# Patient Record
Sex: Female | Born: 1975 | Race: Black or African American | Hispanic: No | Smoking: Current every day smoker
Health system: Southern US, Community
[De-identification: ages and names within clinical notes are randomized; demographics above are authoritative.]

## PROBLEM LIST (undated history)

## (undated) DIAGNOSIS — Z170 Estrogen receptor positive status [ER+]: Principal | ICD-10-CM

## (undated) DIAGNOSIS — J449 Chronic obstructive pulmonary disease, unspecified: Secondary | ICD-10-CM

## (undated) DIAGNOSIS — F319 Bipolar disorder, unspecified: Secondary | ICD-10-CM

## (undated) DIAGNOSIS — F32A Depression, unspecified: Secondary | ICD-10-CM

## (undated) DIAGNOSIS — F419 Anxiety disorder, unspecified: Secondary | ICD-10-CM

## (undated) DIAGNOSIS — F329 Major depressive disorder, single episode, unspecified: Secondary | ICD-10-CM

## (undated) DIAGNOSIS — F4310 Post-traumatic stress disorder, unspecified: Secondary | ICD-10-CM

## (undated) DIAGNOSIS — C50412 Malignant neoplasm of upper-outer quadrant of left female breast: Principal | ICD-10-CM

## (undated) HISTORY — DX: Malignant neoplasm of upper-outer quadrant of left female breast: C50.412

## (undated) HISTORY — DX: Anxiety disorder, unspecified: F41.9

## (undated) HISTORY — PX: EYE SURGERY: SHX253

## (undated) HISTORY — DX: Estrogen receptor positive status (ER+): Z17.0

## (undated) NOTE — *Deleted (*Deleted)
Patient Care Team: Department, [facility] as PCP - General Claud Kelp, MD as Consulting Physician (General Surgery) Serena Croissant, MD as Consulting Physician (Hematology and Oncology) Dorothy Puffer, MD as Consulting Physician (Radiation Oncology) Axel Filler Larna Daughters, NP as Nurse Practitioner (Hematology and Oncology)  DIAGNOSIS: No diagnosis found.  SUMMARY OF ONCOLOGIC HISTORY: Oncology History  Malignant neoplasm of upper-outer quadrant of left breast in female, estrogen receptor positive (HCC)  11/01/2016 Initial Diagnosis   Palpable left breast mass with 4 enl axillary lymph nodes 4.8 x 0.8 x 3.1 cm; left axillary mass 1.1 x 0.5 cm, lymph node 1.4 x 0.8 cm biopsy grade 2-3 IDC ER 30% PR 0% HER-2 positive ratio 7.53, Ki-67 20%, T2 N2 MX stage IIIa clinical stage   12/02/2016 Genetic Testing   Negative genetic testing on the STAT panel.  The STAT Breast cancer panel offered by [facility] includes sequencing and rearrangement analysis for the following 9 genes:  ATM, BRCA1, BRCA2, CDH1, CHEK2, PALB2, PTEN, STK11 and TP53.   The report date is December 02, 2016.   12/14/2016 Surgery   Left modified radical mastectomy: IDC grade 3, spans 5 cm and a 2.2 cm with extensive DCIS grade 3, lymphovascular invasion present, margins negative, 17/17 lymph nodes positive, ER 30%, PR 0%, HER-2 positive, Ki-67 20%, T2 N3a M0 stage IIIB AJCC 8   01/10/2017 - 03/27/2018 Chemotherapy   Adjuvant chemotherapy with TCH Perjeta 6 cycles followed by Herceptin Perjeta maintenance (multiple delays and missed doses led to final discontinuation)   06/07/2017 - 07/25/2017 Radiation Therapy   Adjuvant radiation therapy   08/2017 -  Anti-estrogen oral therapy   Tamoxifen daily   09/02/2019 Progression   She was admitted to [facility] from 09/02/19-09/07/19 after presenting to the ED for headaches and unsteady gait. 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 Pathology showed metastatic adenocarcinoma, consistent with breast origin, HER-2 positive (3+), ER/PR negative, Ki67 75%.      CHIEF COMPLIANT: Follow-up of metastatic breast cancer.   INTERVAL HISTORY: Diane Rosario is a 79 y.o. with above-mentioned history of metastatic breast cancer s/p resection of a cerebellar mass.She presents to the clinic todayfor follow-up.   ALLERGIES:  is allergic to asa [aspirin].  MEDICATIONS:  Current Outpatient Medications  Medication Sig Dispense Refill  . ALPRAZolam (XANAX) 0.5 MG tablet Take 0.5 mg by mouth 2 (two) times daily as needed for anxiety.     Marland Kitchen dexamethasone (DECADRON) 1 MG tablet Take 1 tablet (1 mg total) by mouth 2 (two) times daily with a meal. Take 2 tablets (2 mg total) twice a day x1 day, then decrease to 1 mg twice a day x2 weeks. 18 tablet 0  . ENSURE (ENSURE) Take 237 mLs by mouth daily.    Marland Kitchen HYDROcodone-acetaminophen (NORCO) 5-325 MG tablet Take 1 tablet by mouth every 6 (six) hours as needed for moderate pain. 60 tablet 0  . risperiDONE microspheres (RISPERDAL CONSTA) 50 MG injection Inject 50 mg into the muscle every 14 (fourteen) days.    . tamoxifen (NOLVADEX) 20 MG tablet Take 1 tablet (20 mg total) by mouth 2 (two) times daily. (Patient not taking: Reported on 09/23/2019) 90 tablet 3   No current facility-administered medications for this visit.   Facility-Administered Medications Ordered in Other Visits  Medication Dose Route Frequency Provider Last Rate Last Admin  . sodium chloride flush (NS) 0.9 % injection 10 mL  10 mL Intracatheter PRN Gudena,  Mikey [employer], MD        PHYSICAL EXAMINATION: ECOG PERFORMANCE STATUS: {CHL ONC ECOG BJ:[id number]}  There were no vitals filed for this visit. There were no vitals filed for this visit.  LABORATORY DATA:  I have reviewed the data as listed CMP Latest Ref Rng & Units 09/05/2019 09/03/2019 09/02/2019  Glucose 70 - 99 mg/dL 086(V) 784(O) 962(X)  BUN 6 - 20  mg/dL 12 52(W) 41(L)  Creatinine 0.44 - 1.00 mg/dL 2.44 0.10 2.72  Sodium 135 - 145 mmol/L 134(L) 141 139  Potassium 3.5 - 5.1 mmol/L 4.2 4.0 4.2  Chloride 98 - 111 mmol/L 97(L) 101 101  CO2 22 - 32 mmol/L 25 31 26   Calcium 8.9 - 10.3 mg/dL 9.0 9.5 9.4  Total Protein 6.5 - 8.1 g/dL - - 7.8  Total Bilirubin 0.3 - 1.2 mg/dL - - 0.4  Alkaline Phos 38 - 126 U/L - - 56  AST 15 - 41 U/L - - 34  ALT 0 - 44 U/L - - 18    Lab Results  Component Value Date   WBC 9.6 09/05/2019   HGB 10.4 (L) 09/05/2019   HCT 31.9 (L) 09/05/2019   MCV 99.7 09/05/2019   PLT 199 09/05/2019   NEUTROABS 4.7 09/02/2019    ASSESSMENT & PLAN:  No problem-specific Assessment & Plan notes found for this encounter.    No orders of the defined types were placed in this encounter.  The patient has a good understanding of the overall plan. she agrees with it. she will call with any problems that may develop before the next visit here.  Total time spent: *** mins including face to face time and time spent for planning, charting and coordination of care  Serena Croissant, MD 01/19/2020  I, Kirt Boys Dorshimer, am acting as scribe for Dr. Serena Croissant.  {insert scribe attestation}

---

## 1997-08-20 ENCOUNTER — Ambulatory Visit (HOSPITAL_COMMUNITY): Admission: AD | Admit: 1997-08-20 | Discharge: 1997-08-21 | Payer: Self-pay | Admitting: Obstetrics

## 1997-08-21 ENCOUNTER — Emergency Department (HOSPITAL_COMMUNITY): Admission: EM | Admit: 1997-08-21 | Discharge: 1997-08-21 | Payer: Self-pay | Admitting: Emergency Medicine

## 1997-09-19 ENCOUNTER — Inpatient Hospital Stay (HOSPITAL_COMMUNITY): Admission: AD | Admit: 1997-09-19 | Discharge: 1997-09-19 | Payer: Self-pay | Admitting: *Deleted

## 1998-04-19 ENCOUNTER — Inpatient Hospital Stay (HOSPITAL_COMMUNITY): Admission: AD | Admit: 1998-04-19 | Discharge: 1998-04-19 | Payer: Self-pay | Admitting: Obstetrics & Gynecology

## 1999-10-12 ENCOUNTER — Inpatient Hospital Stay (HOSPITAL_COMMUNITY): Admission: AD | Admit: 1999-10-12 | Discharge: 1999-10-12 | Payer: Self-pay | Admitting: *Deleted

## 2001-01-15 ENCOUNTER — Inpatient Hospital Stay (HOSPITAL_COMMUNITY): Admission: AD | Admit: 2001-01-15 | Discharge: 2001-01-15 | Payer: Self-pay | Admitting: Obstetrics

## 2001-11-04 ENCOUNTER — Inpatient Hospital Stay (HOSPITAL_COMMUNITY): Admission: AD | Admit: 2001-11-04 | Discharge: 2001-11-04 | Payer: Self-pay | Admitting: *Deleted

## 2004-11-25 ENCOUNTER — Emergency Department (HOSPITAL_COMMUNITY): Admission: EM | Admit: 2004-11-25 | Discharge: 2004-11-25 | Payer: Self-pay | Admitting: Emergency Medicine

## 2008-03-08 ENCOUNTER — Emergency Department (HOSPITAL_COMMUNITY): Admission: EM | Admit: 2008-03-08 | Discharge: 2008-03-08 | Payer: Self-pay | Admitting: Emergency Medicine

## 2008-03-08 IMAGING — CR DG KNEE COMPLETE 4+V*R*
4 series · 4 of 4 positions shown · non-contrast
Comparison: None available.

CLINICAL DATA: Trauma.  Knee pain.

RIGHT KNEE - COMPLETE 4+ VIEW

[t knee ap right]
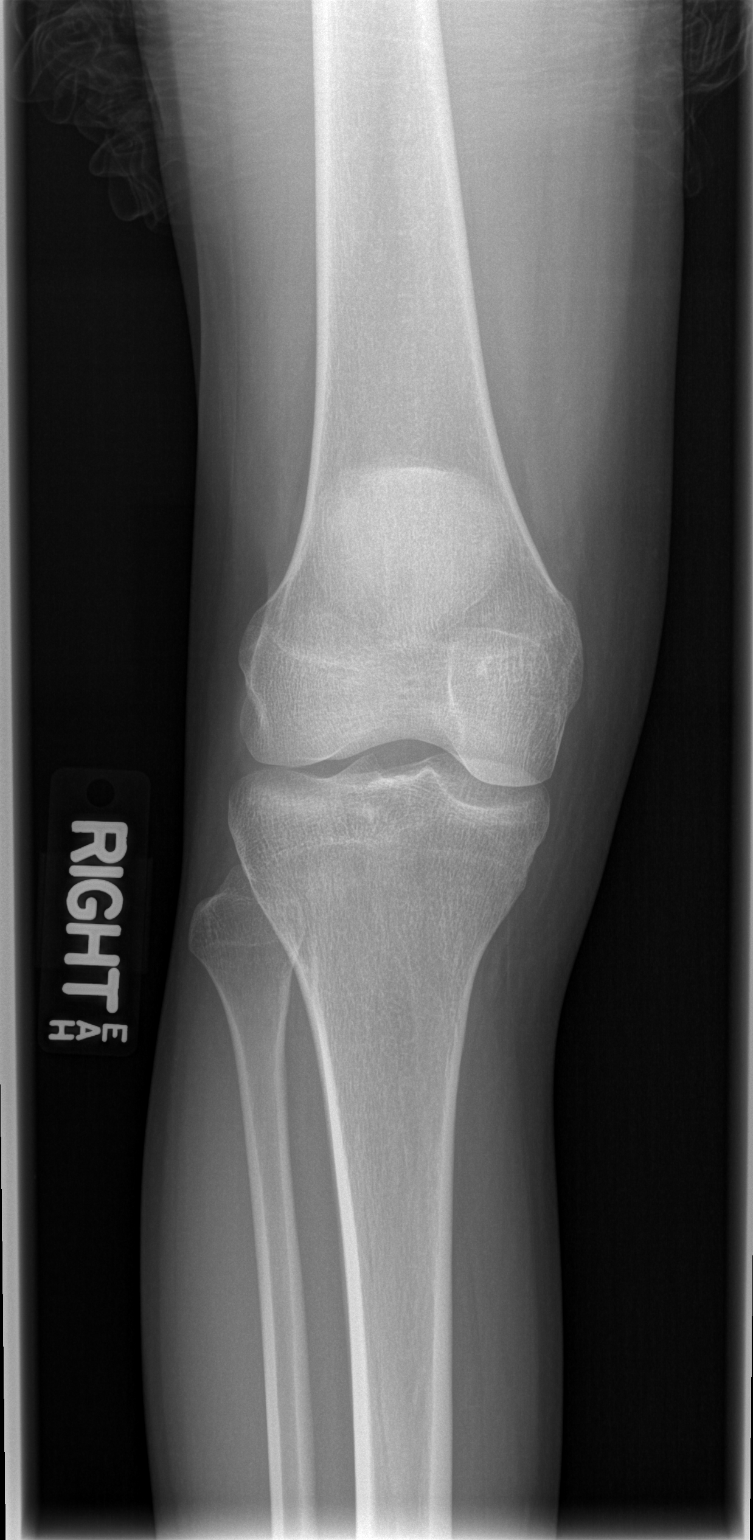

[t knee oblique right (1 of 2)]
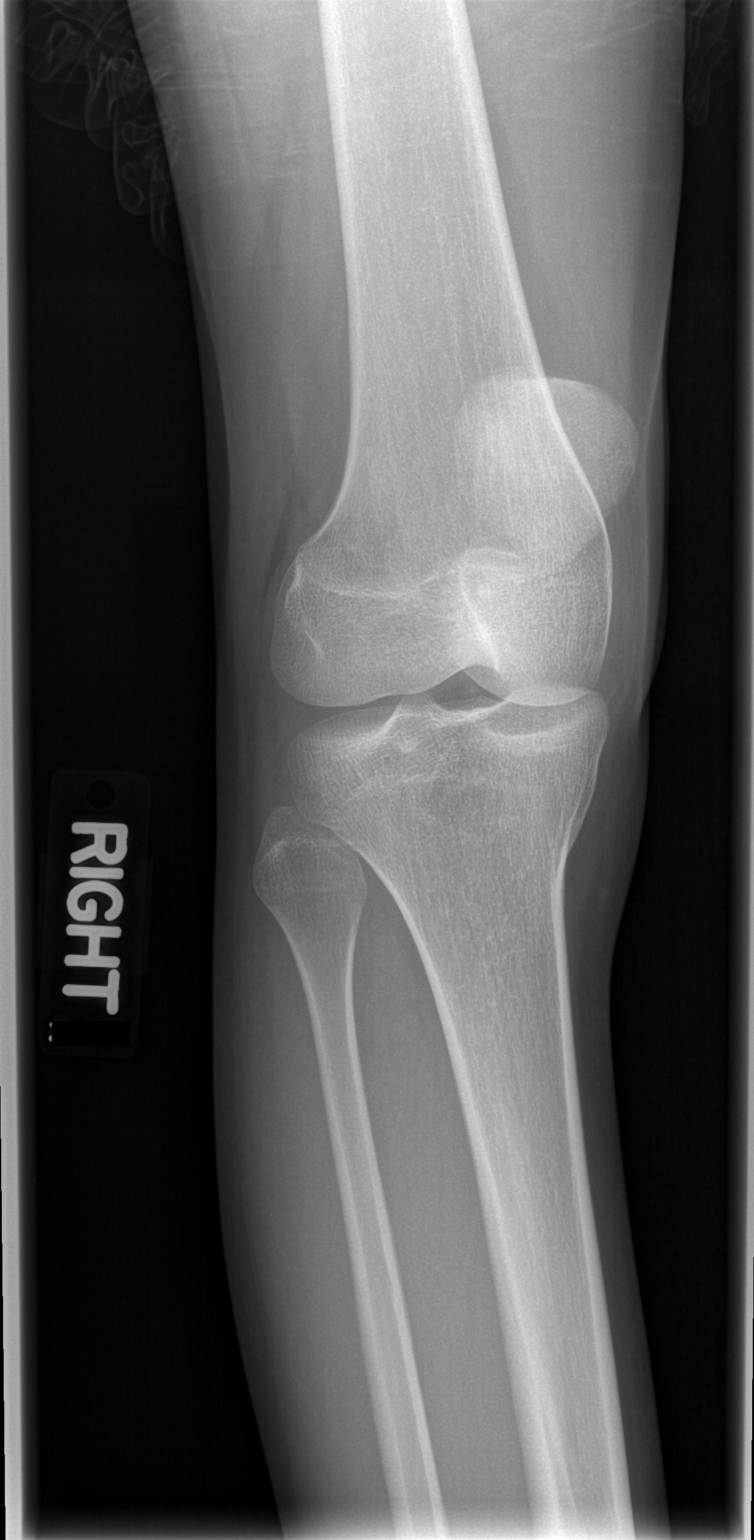

[t knee oblique right (2 of 2)]
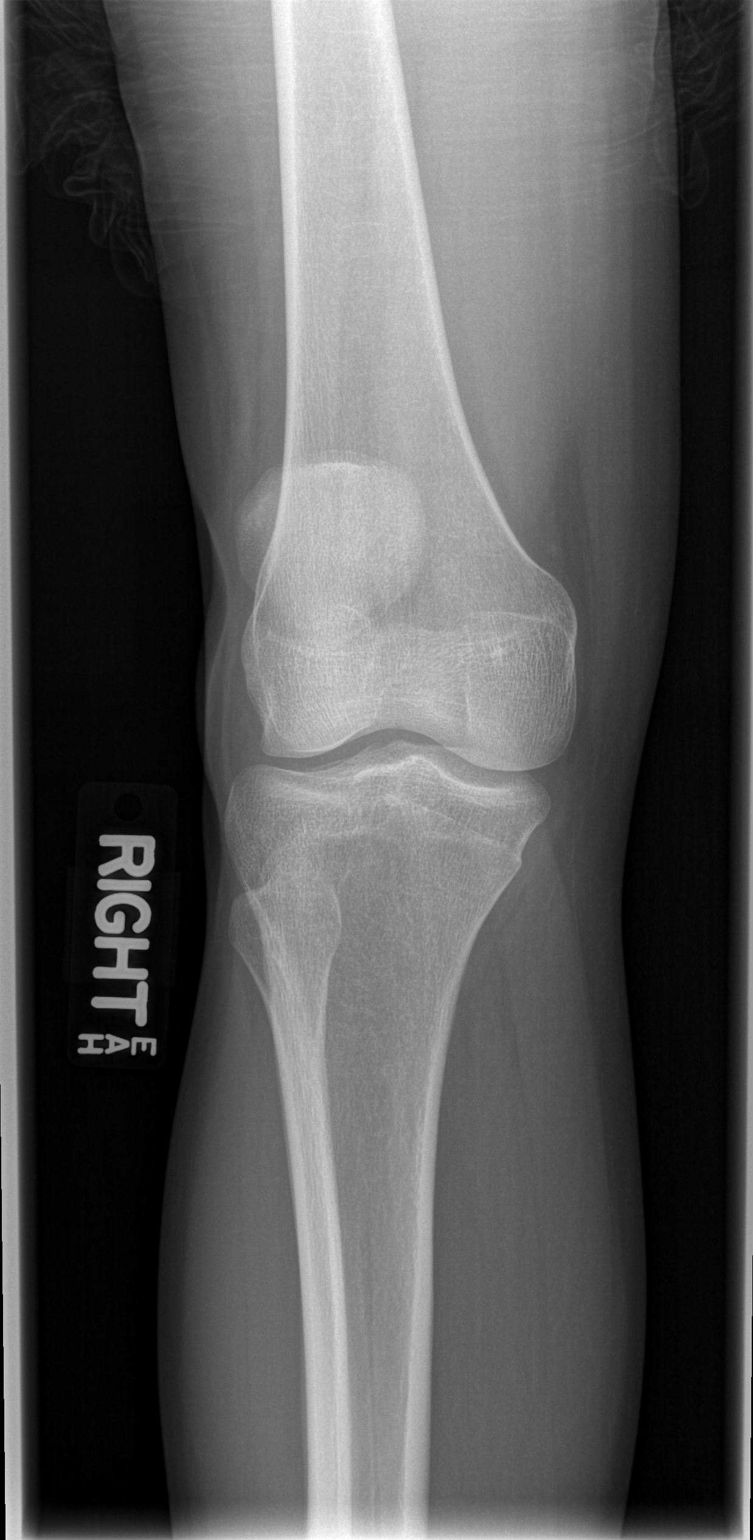

[t knee lat right]
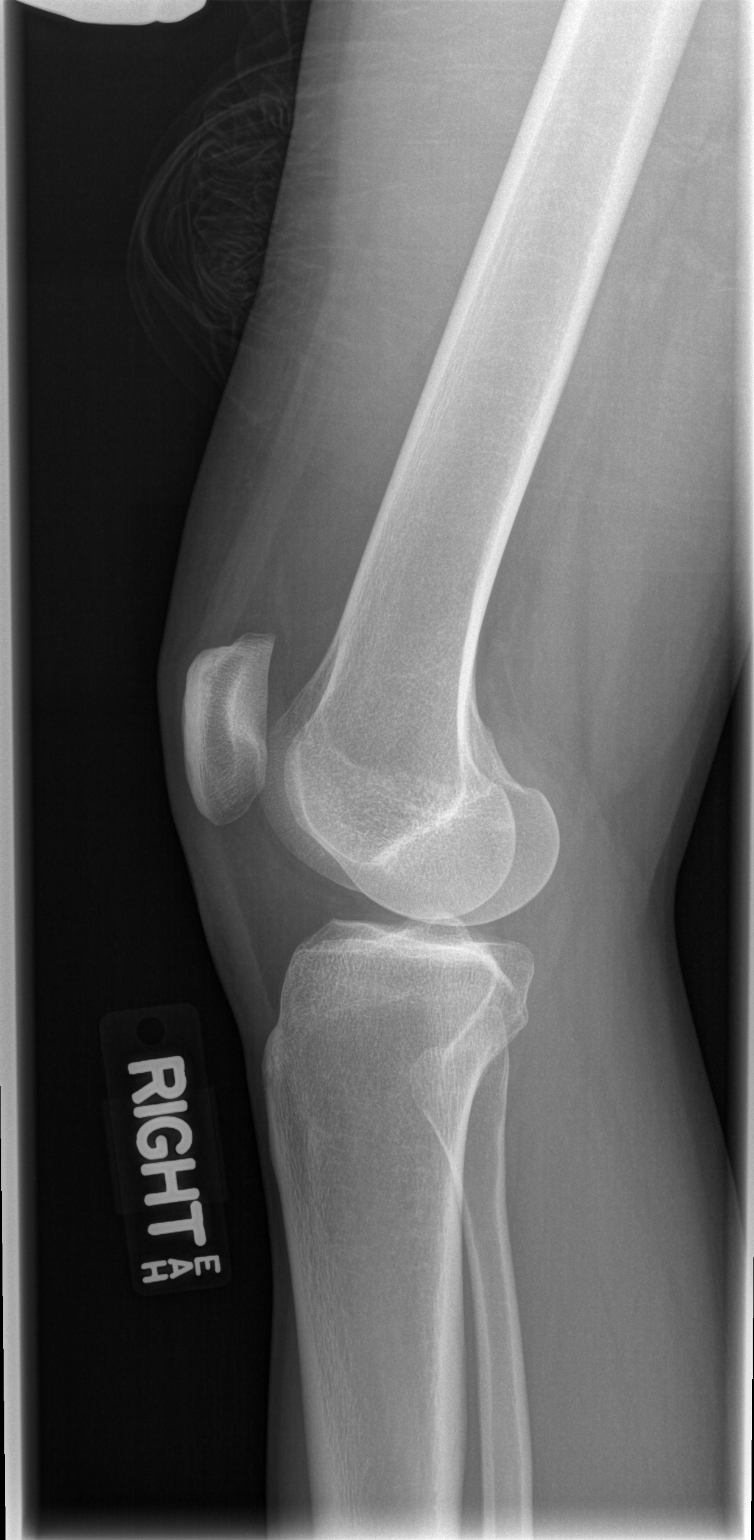

[4 of 4 positions shown; findings below may reference images not displayed]

FINDINGS: The right knee is located.  There is no acute fracture or
subluxation.  There is no significant effusion.  Mild degenerative
changes are noted in the patellofemoral compartment.
IMPRESSION: 1.  No acute abnormality.
2.  Mild degenerative changes in the patellofemoral compartment of
the knee.

## 2009-12-20 ENCOUNTER — Emergency Department (HOSPITAL_COMMUNITY): Admission: EM | Admit: 2009-12-20 | Discharge: 2009-12-20 | Payer: Self-pay | Admitting: Emergency Medicine

## 2010-01-04 ENCOUNTER — Emergency Department (HOSPITAL_COMMUNITY): Admission: EM | Admit: 2010-01-04 | Discharge: 2010-01-04 | Payer: Self-pay | Admitting: Emergency Medicine

## 2010-01-04 IMAGING — CR DG CHEST 2V
2 series · 2 of 2 positions shown · non-contrast
Comparison: None.

CLINICAL DATA: Fever.  Vomiting.  Cough.

CHEST - 2 VIEW

[w chest pa]
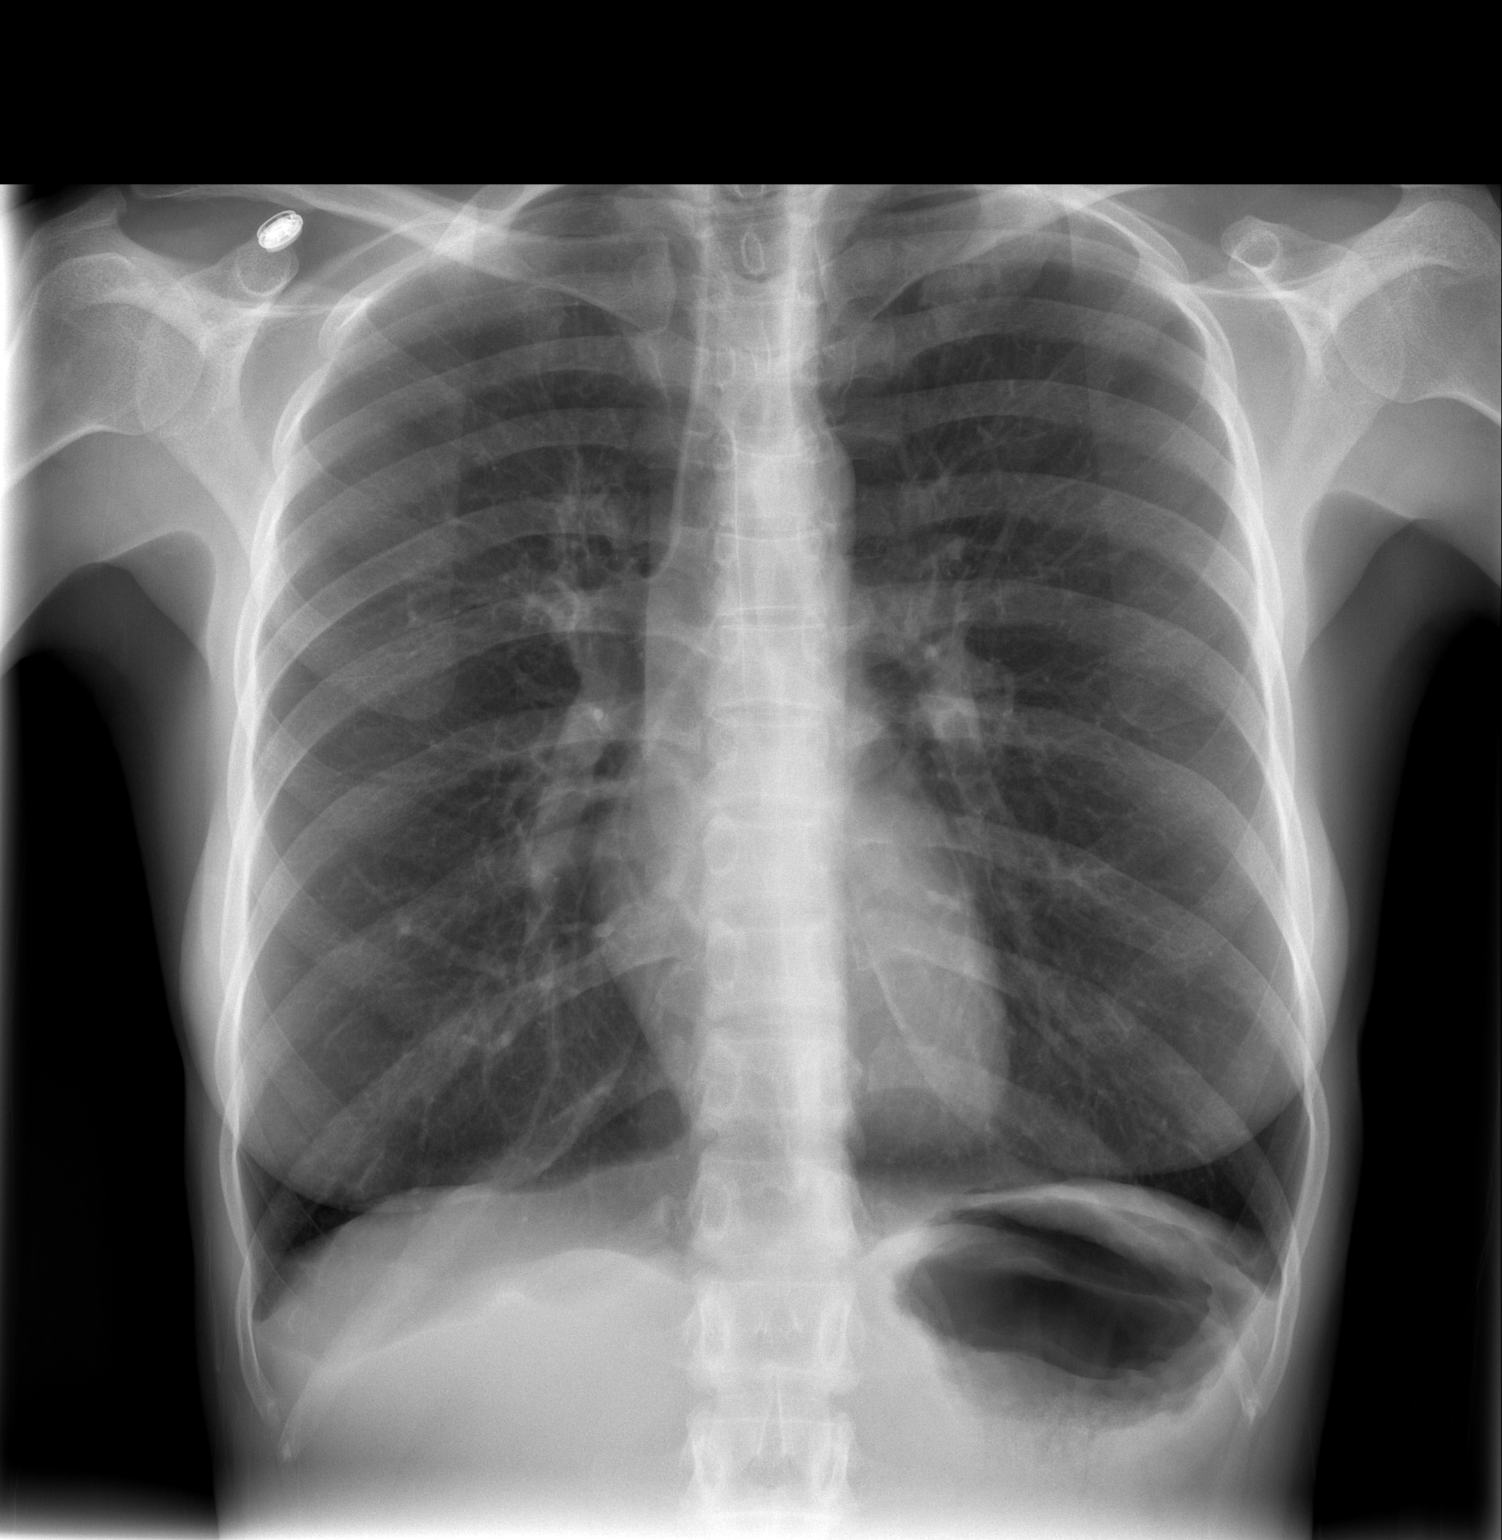

[w chest lat]
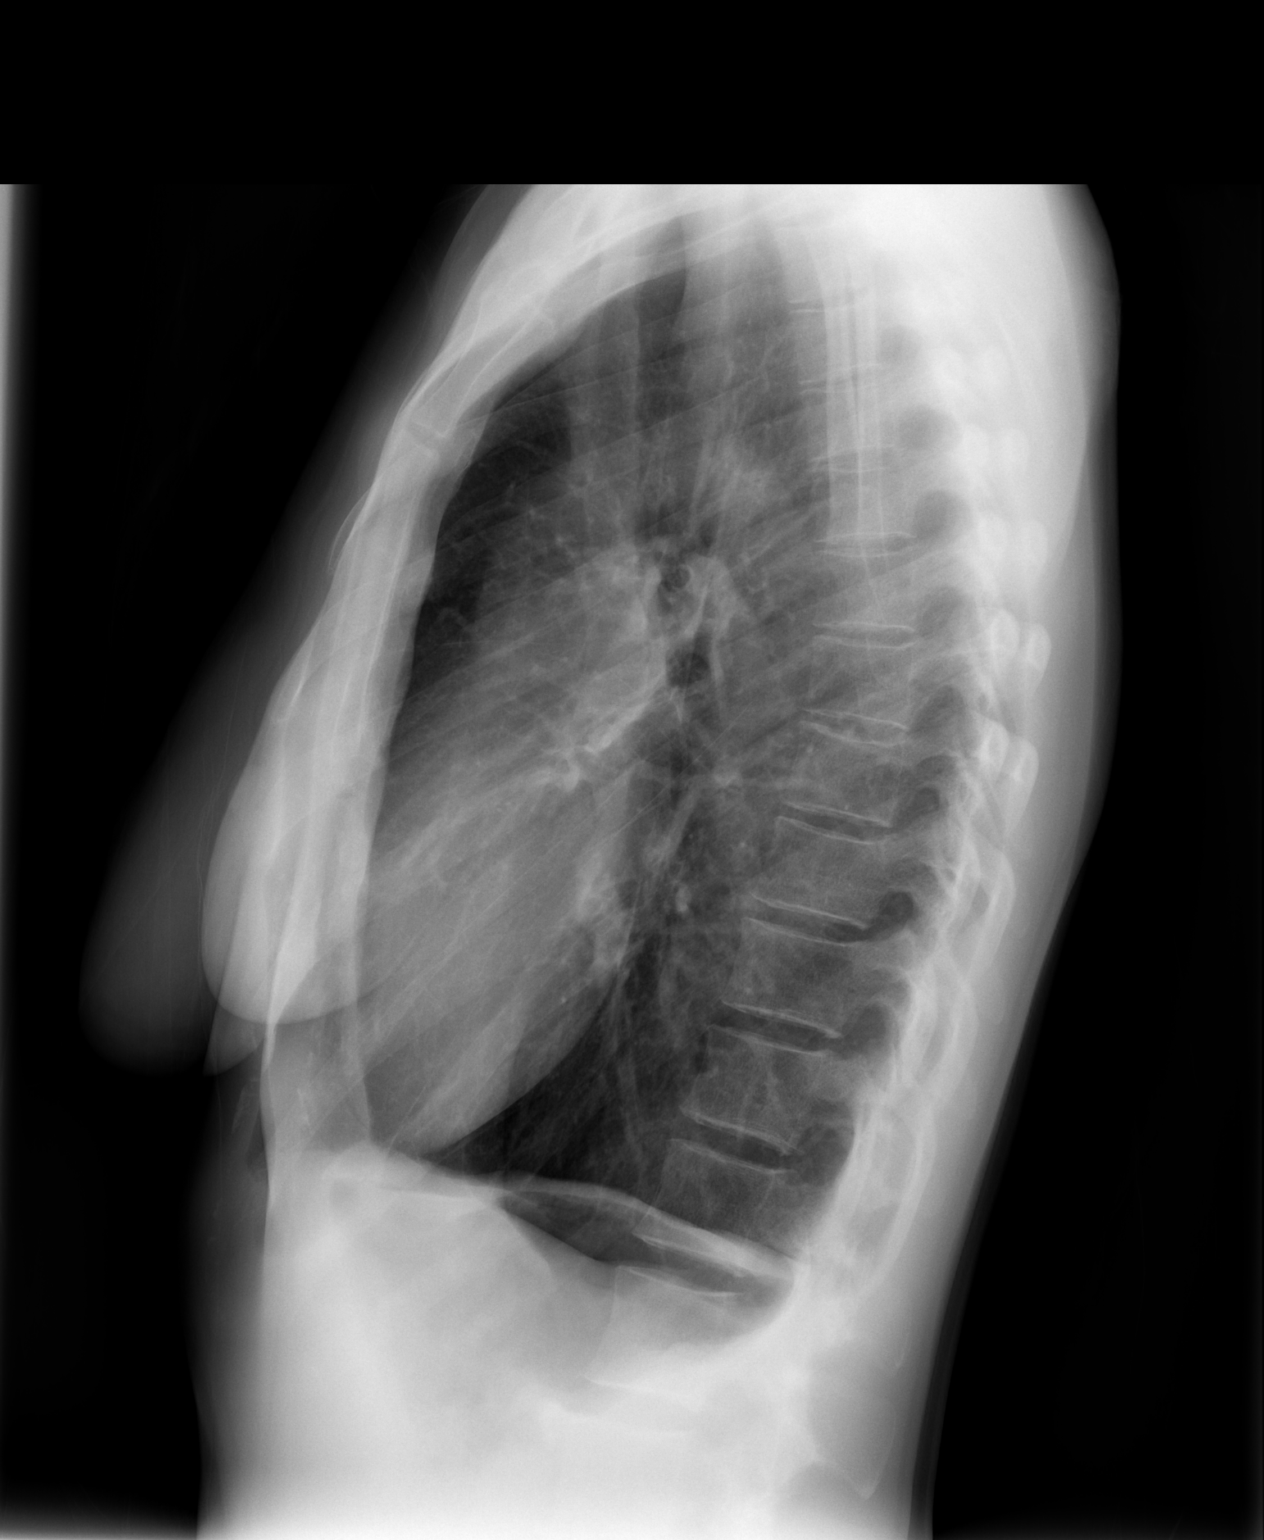

[2 of 2 positions shown; findings below may reference images not displayed]

FINDINGS: Hyperinflation of the lungs is present.  Flattening of
hemidiaphragms on the lateral view.  Trachea midline.  No
pneumothorax.  No airspace disease or effusion.  Cardiopericardial
silhouette appears within normal limits.
IMPRESSION: Hyperinflation of the lungs.  This is commonly associated with
emphysema or asthma.

## 2010-09-18 ENCOUNTER — Emergency Department (HOSPITAL_COMMUNITY): Payer: Self-pay

## 2010-09-18 ENCOUNTER — Emergency Department (HOSPITAL_COMMUNITY)
Admission: EM | Admit: 2010-09-18 | Discharge: 2010-09-18 | Disposition: A | Discharge status: Home / Self Care | Payer: Self-pay | Attending: Emergency Medicine | Admitting: Emergency Medicine

## 2010-09-18 DIAGNOSIS — W268XXA Contact with other sharp object(s), not elsewhere classified, initial encounter: Secondary | ICD-10-CM | POA: Insufficient documentation

## 2010-09-18 DIAGNOSIS — S51809A Unspecified open wound of unspecified forearm, initial encounter: Secondary | ICD-10-CM | POA: Insufficient documentation

## 2010-09-18 DIAGNOSIS — IMO0002 Reserved for concepts with insufficient information to code with codable children: Secondary | ICD-10-CM | POA: Insufficient documentation

## 2010-09-18 IMAGING — CR DG HAND COMPLETE 3+V*R*
3 series · 3 of 3 positions shown · non-contrast
Comparison: None.

CLINICAL DATA: Pain and right hand and forearm.  Cut with glass
door.

RIGHT HAND - COMPLETE 3+ VIEW

[x hand ap right]
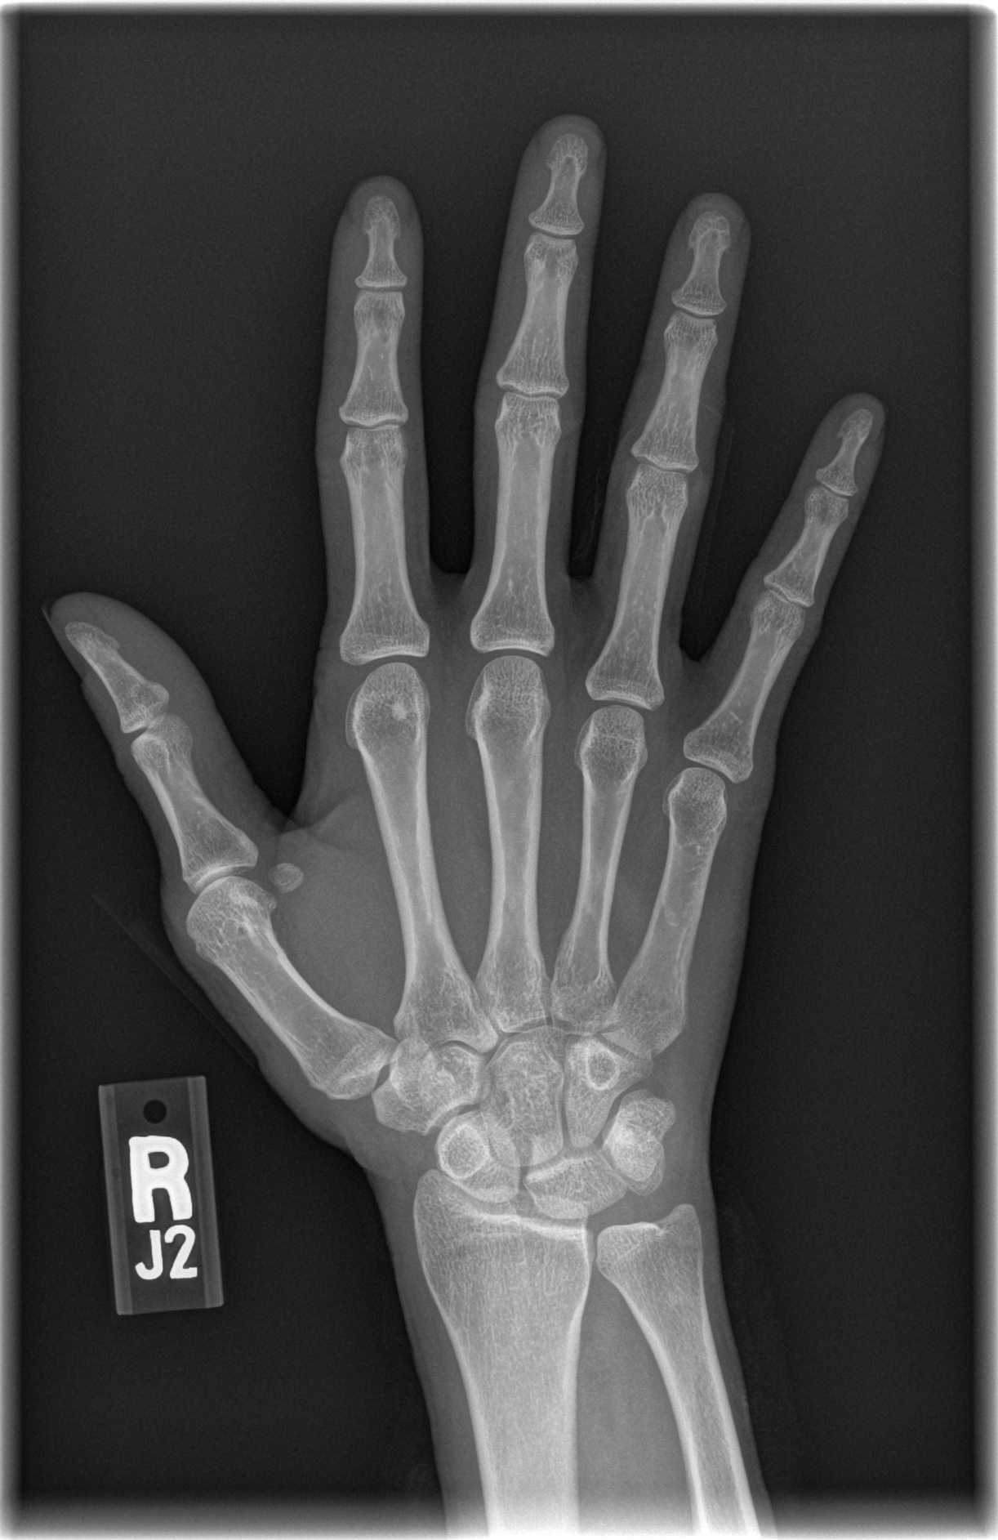

[x hand oblique right]
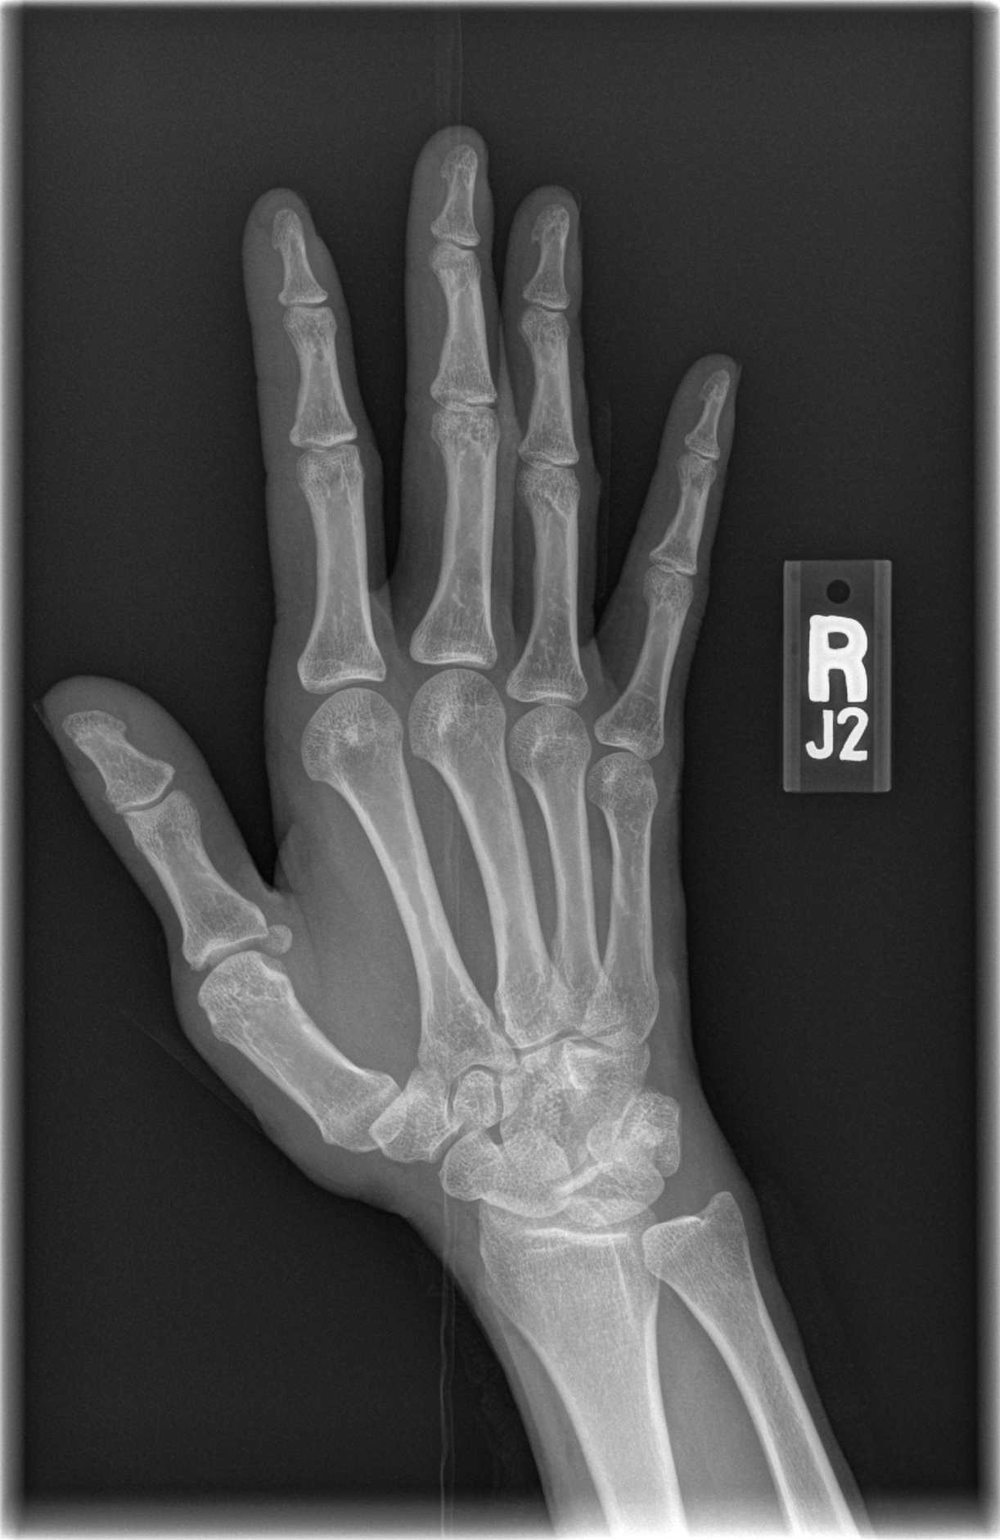

[x hand lat right]
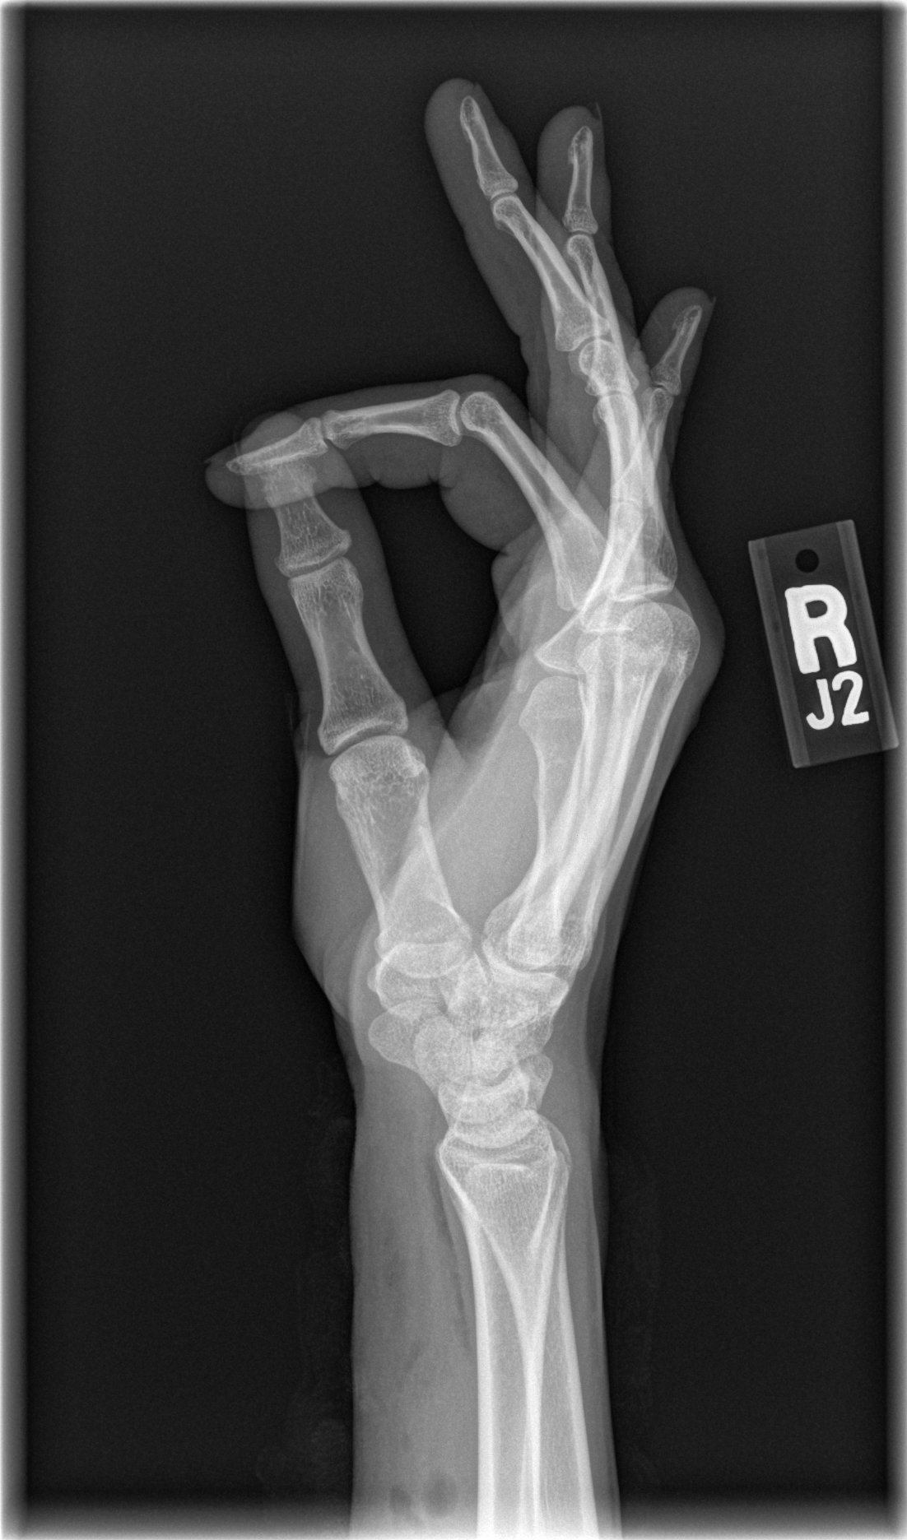

[3 of 3 positions shown; findings below may reference images not displayed]

FINDINGS: No radiopaque foreign body or fracture.  There are
locules of air in the soft tissues of the distal forearm.
IMPRESSION: Soft tissue gas in the distal forearm without radiopaque foreign
body or fracture.

## 2010-09-18 IMAGING — CR DG FOREARM 2V*R*
2 series · 2 of 2 positions shown · non-contrast
Comparison: None.

CLINICAL DATA: Pain.

RIGHT FOREARM - 2 VIEW

[x forearm ap right]
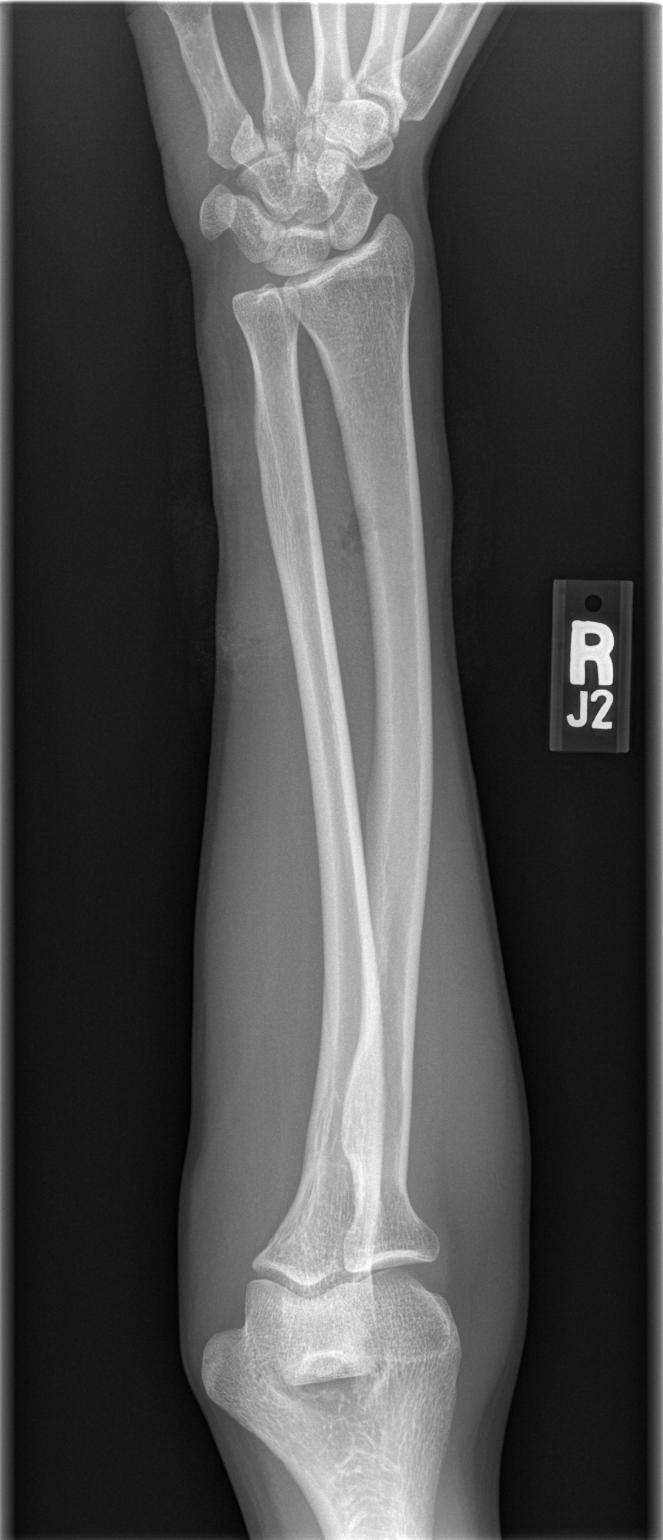

[x forearm lat right]
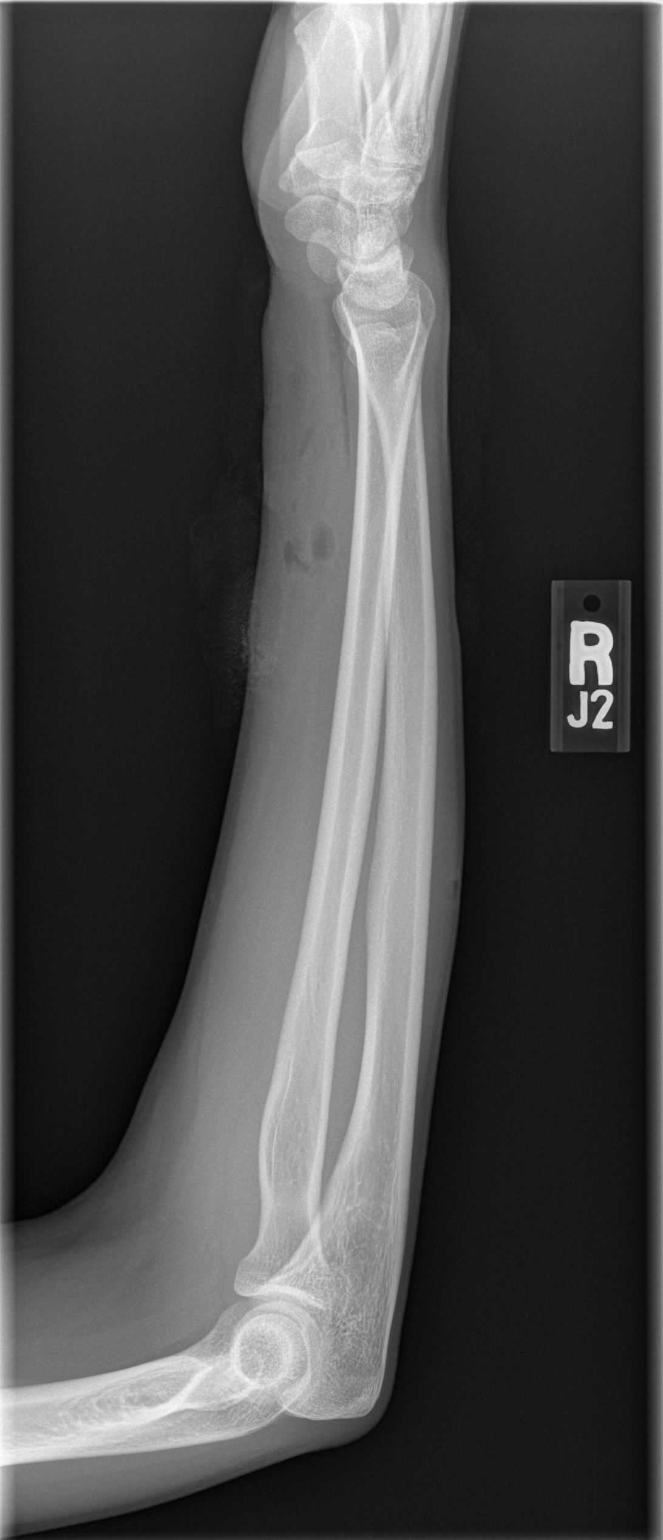

[2 of 2 positions shown; findings below may reference images not displayed]

FINDINGS: There are locules of soft tissue gas along the volar
aspect of the distal forearm.  No underlying acute osseous
abnormality.
IMPRESSION: Locules of soft tissue gas along the volar aspect of the distal
forearm.  No underlying acute osseous abnormality.

## 2010-09-28 ENCOUNTER — Emergency Department (HOSPITAL_COMMUNITY)
Admission: EM | Admit: 2010-09-28 | Discharge: 2010-09-28 | Disposition: A | Discharge status: Home / Self Care | Payer: Self-pay | Attending: Emergency Medicine | Admitting: Emergency Medicine

## 2010-09-28 DIAGNOSIS — Z4802 Encounter for removal of sutures: Secondary | ICD-10-CM | POA: Insufficient documentation

## 2010-09-28 DIAGNOSIS — M79609 Pain in unspecified limb: Secondary | ICD-10-CM | POA: Insufficient documentation

## 2010-10-10 ENCOUNTER — Emergency Department (HOSPITAL_COMMUNITY)
Admission: EM | Admit: 2010-10-10 | Discharge: 2010-10-10 | Disposition: A | Discharge status: Home / Self Care | Payer: Self-pay | Attending: Emergency Medicine | Admitting: Emergency Medicine

## 2010-10-10 DIAGNOSIS — L2989 Other pruritus: Secondary | ICD-10-CM | POA: Insufficient documentation

## 2010-10-10 DIAGNOSIS — I10 Essential (primary) hypertension: Secondary | ICD-10-CM | POA: Insufficient documentation

## 2010-10-10 DIAGNOSIS — L509 Urticaria, unspecified: Secondary | ICD-10-CM | POA: Insufficient documentation

## 2010-10-10 DIAGNOSIS — L298 Other pruritus: Secondary | ICD-10-CM | POA: Insufficient documentation

## 2010-10-10 DIAGNOSIS — T7840XA Allergy, unspecified, initial encounter: Secondary | ICD-10-CM | POA: Insufficient documentation

## 2010-10-10 DIAGNOSIS — R21 Rash and other nonspecific skin eruption: Secondary | ICD-10-CM | POA: Insufficient documentation

## 2012-09-05 ENCOUNTER — Inpatient Hospital Stay (HOSPITAL_COMMUNITY)
Admission: AD | Admit: 2012-09-05 | Discharge: 2012-09-11 | DRG: 885 | Disposition: A | Discharge status: Home / Self Care | Payer: No Typology Code available for payment source | Source: Intra-hospital | Attending: Psychiatry | Admitting: Psychiatry

## 2012-09-05 ENCOUNTER — Emergency Department (HOSPITAL_COMMUNITY): Payer: Medicaid Other

## 2012-09-05 ENCOUNTER — Encounter (HOSPITAL_COMMUNITY): Payer: Self-pay

## 2012-09-05 ENCOUNTER — Encounter (HOSPITAL_COMMUNITY): Payer: Self-pay | Admitting: *Deleted

## 2012-09-05 ENCOUNTER — Emergency Department (HOSPITAL_COMMUNITY)
Admission: EM | Admit: 2012-09-05 | Discharge: 2012-09-05 | Disposition: A | Discharge status: Home / Self Care | Payer: Medicaid Other | Attending: Emergency Medicine | Admitting: Emergency Medicine

## 2012-09-05 DIAGNOSIS — R45851 Suicidal ideations: Secondary | ICD-10-CM | POA: Insufficient documentation

## 2012-09-05 DIAGNOSIS — F333 Major depressive disorder, recurrent, severe with psychotic symptoms: Principal | ICD-10-CM

## 2012-09-05 DIAGNOSIS — Z79899 Other long term (current) drug therapy: Secondary | ICD-10-CM

## 2012-09-05 DIAGNOSIS — A599 Trichomoniasis, unspecified: Secondary | ICD-10-CM

## 2012-09-05 DIAGNOSIS — F1210 Cannabis abuse, uncomplicated: Secondary | ICD-10-CM

## 2012-09-05 DIAGNOSIS — F319 Bipolar disorder, unspecified: Secondary | ICD-10-CM | POA: Insufficient documentation

## 2012-09-05 DIAGNOSIS — Z3202 Encounter for pregnancy test, result negative: Secondary | ICD-10-CM | POA: Insufficient documentation

## 2012-09-05 DIAGNOSIS — F4310 Post-traumatic stress disorder, unspecified: Secondary | ICD-10-CM

## 2012-09-05 DIAGNOSIS — Z008 Encounter for other general examination: Secondary | ICD-10-CM | POA: Insufficient documentation

## 2012-09-05 DIAGNOSIS — F1410 Cocaine abuse, uncomplicated: Secondary | ICD-10-CM

## 2012-09-05 DIAGNOSIS — F17200 Nicotine dependence, unspecified, uncomplicated: Secondary | ICD-10-CM | POA: Insufficient documentation

## 2012-09-05 HISTORY — DX: Depression, unspecified: F32.A

## 2012-09-05 HISTORY — DX: Bipolar disorder, unspecified: F31.9

## 2012-09-05 HISTORY — DX: Post-traumatic stress disorder, unspecified: F43.10

## 2012-09-05 HISTORY — DX: Major depressive disorder, single episode, unspecified: F32.9

## 2012-09-05 LAB — URINE MICROSCOPIC-ADD ON

## 2012-09-05 LAB — COMPREHENSIVE METABOLIC PANEL
ALT: 10 U/L (ref 0–35)
AST: 14 U/L (ref 0–37)
Albumin: 3.6 g/dL (ref 3.5–5.2)
Alkaline Phosphatase: 50 U/L (ref 39–117)
BUN: 7 mg/dL (ref 6–23)
CO2: 25 mEq/L (ref 19–32)
Calcium: 9.1 mg/dL (ref 8.4–10.5)
Chloride: 102 mEq/L (ref 96–112)
Creatinine, Ser: 0.73 mg/dL (ref 0.50–1.10)
GFR calc Af Amer: >90 mL/min (ref >90)
GFR calc non Af Amer: >90 mL/min (ref >90)
Glucose, Bld: 88 mg/dL (ref 70–99)
Potassium: 3.8 mEq/L (ref 3.5–5.1)
Sodium: 136 mEq/L (ref 135–145)
Total Bilirubin: 0.3 mg/dL (ref 0.3–1.2)
Total Protein: 7.4 g/dL (ref 6.0–8.3)

## 2012-09-05 LAB — CBC
HCT: 36.8 % (ref 36.0–46.0)
Hemoglobin: 12.4 g/dL (ref 12.0–15.0)
MCH: 32.2 pg (ref 26.0–34.0)
MCHC: 33.7 g/dL (ref 30.0–36.0)
MCV: 95.6 fL (ref 78.0–100.0)
Platelets: 316 10*3/uL (ref 150–400)
RBC: 3.85 MIL/uL — ABNORMAL LOW (ref 3.87–5.11)
RDW: 13.4 % (ref 11.5–15.5)
WBC: 6.1 10*3/uL (ref 4.0–10.5)

## 2012-09-05 LAB — SALICYLATE LEVEL: Salicylate Lvl: <2 mg/dL — ABNORMAL LOW (ref 2.8–20.0)

## 2012-09-05 LAB — URINALYSIS, ROUTINE W REFLEX MICROSCOPIC
Bilirubin Urine: NEGATIVE
Glucose, UA: NEGATIVE mg/dL
Hgb urine dipstick: NEGATIVE
Ketones, ur: NEGATIVE mg/dL
Nitrite: NEGATIVE
Protein, ur: NEGATIVE mg/dL
Specific Gravity, Urine: 1.023 (ref 1.005–1.030)
Urobilinogen, UA: 0.2 mg/dL (ref 0.0–1.0)
pH: 6.5 (ref 5.0–8.0)

## 2012-09-05 LAB — RAPID URINE DRUG SCREEN, HOSP PERFORMED
Amphetamines: NOT DETECTED
Barbiturates: NOT DETECTED
Benzodiazepines: NOT DETECTED
Cocaine: POSITIVE — AB
Opiates: NOT DETECTED
Tetrahydrocannabinol: POSITIVE — AB

## 2012-09-05 LAB — ETHANOL: Alcohol, Ethyl (B): <11 mg/dL (ref 0–11)

## 2012-09-05 LAB — POCT PREGNANCY, URINE: Preg Test, Ur: NEGATIVE

## 2012-09-05 LAB — ACETAMINOPHEN LEVEL: Acetaminophen (Tylenol), Serum: <15 ug/mL (ref 10–30)

## 2012-09-05 IMAGING — CR DG CHEST 2V
2 series · 2 of 2 positions shown · non-contrast
Comparison: [DATE]

CLINICAL DATA: Cough, weight loss, smoker

CHEST - 2 VIEW

[w chest pa]
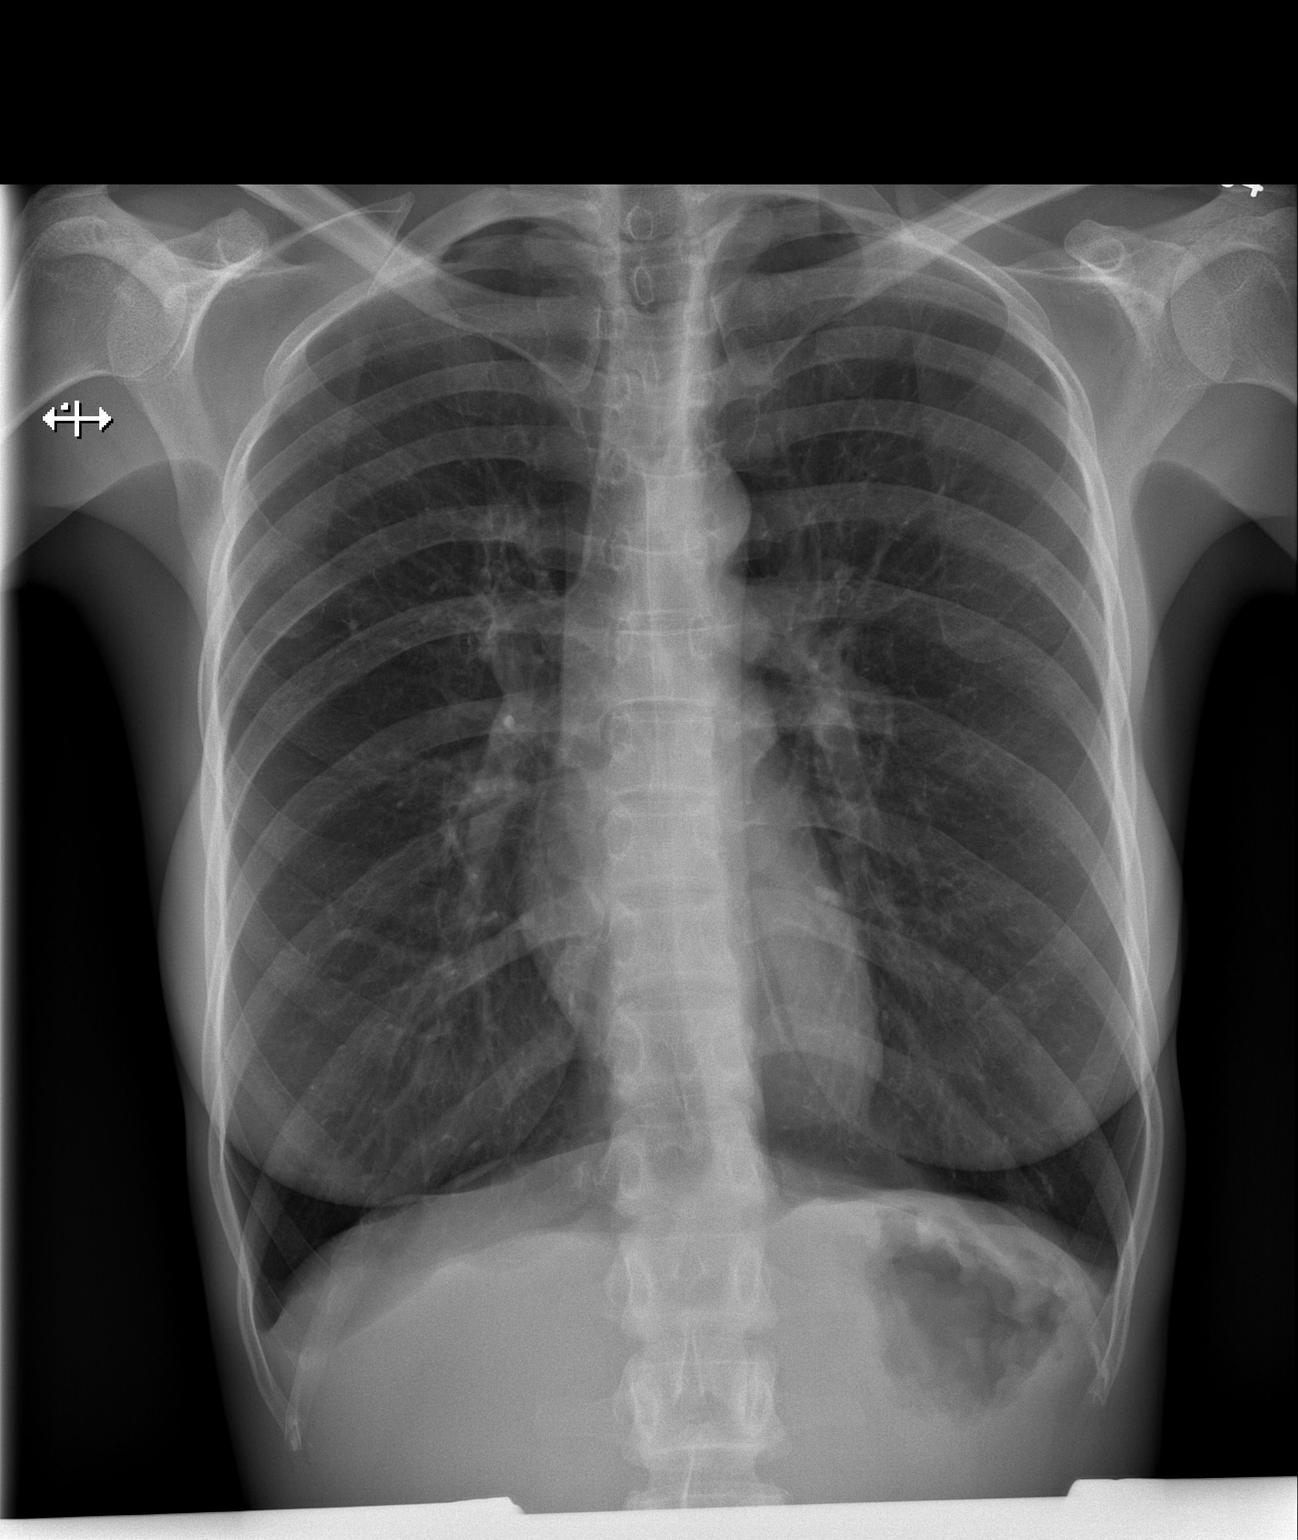

[w chest lat]
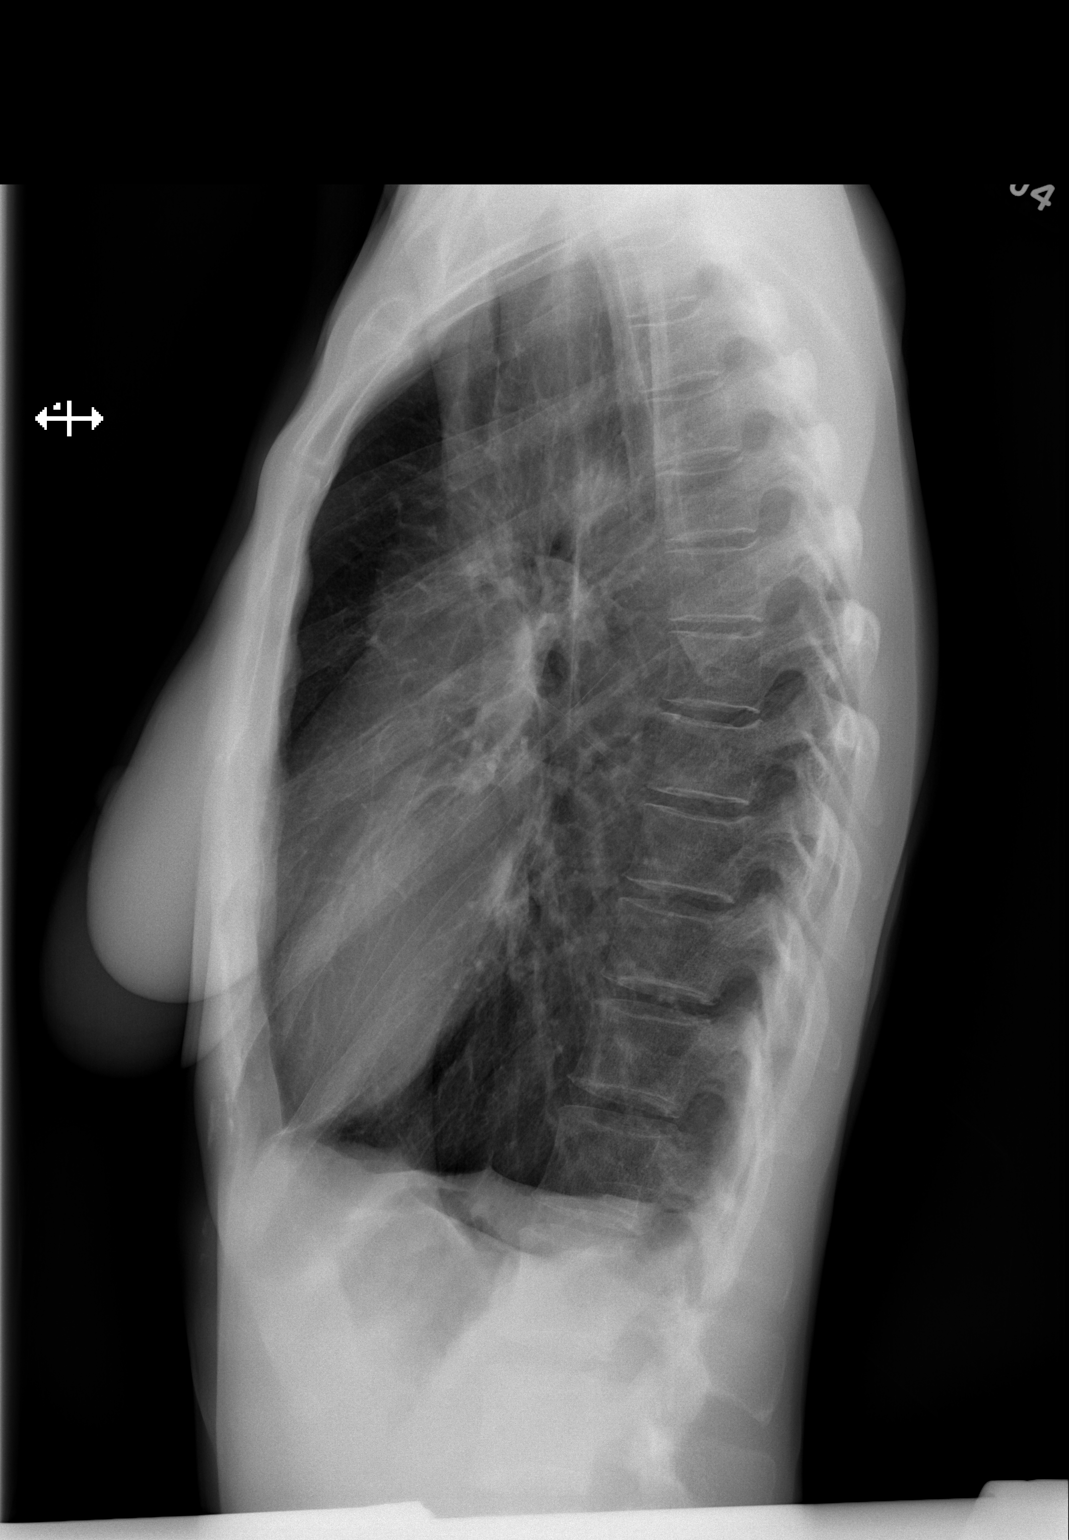

[2 of 2 positions shown; findings below may reference images not displayed]

FINDINGS: Stable hyperinflation.  Normal heart size and
vascularity.  No CHF or pneumonia.  Negative for effusion or
pneumothorax.  Trachea is midline.  Lungs remain clear.  No osseous
abnormality.
IMPRESSION: Hyperinflation without acute chest process

## 2012-09-05 MED ORDER — TRAZODONE HCL 50 MG PO TABS
50.0000 mg | ORAL_TABLET | Freq: Every evening | ORAL | Status: DC | PRN
  Administered 2012-09-05 – 2012-09-10 (×8): 50 mg via ORAL
  Filled 2012-09-05 (×12): qty 1
  Filled 2012-09-05: qty 28
  Filled 2012-09-05 (×3): qty 1
  Filled 2012-09-05: qty 28

## 2012-09-05 MED ORDER — MAGNESIUM HYDROXIDE 400 MG/5ML PO SUSP: 30.0000 mL | Freq: Every day | ORAL | Status: DC | PRN

## 2012-09-05 MED ORDER — ALUM & MAG HYDROXIDE-SIMETH 200-200-20 MG/5ML PO SUSP: 30.0000 mL | ORAL | Status: DC | PRN

## 2012-09-05 MED ORDER — AZITHROMYCIN 250 MG PO TABS
1000.0000 mg | ORAL_TABLET | Freq: Once | ORAL | Status: AC
  Administered 2012-09-05: 1000 mg via ORAL
  Filled 2012-09-05: qty 4

## 2012-09-05 MED ORDER — ADULT MULTIVITAMIN W/MINERALS CH
1.0000 | ORAL_TABLET | Freq: Every day | ORAL | Status: DC
  Administered 2012-09-05 – 2012-09-10 (×6): 1 via ORAL
  Filled 2012-09-05 (×8): qty 1

## 2012-09-05 MED ORDER — ACETAMINOPHEN 325 MG PO TABS: 650.0000 mg | ORAL_TABLET | Freq: Four times a day (QID) | ORAL | Status: DC | PRN

## 2012-09-05 MED ORDER — METRONIDAZOLE 500 MG PO TABS
2000.0000 mg | ORAL_TABLET | Freq: Once | ORAL | Status: AC
  Administered 2012-09-05: 2000 mg via ORAL
  Filled 2012-09-05 (×2): qty 4

## 2012-09-05 MED ORDER — NICOTINE 14 MG/24HR TD PT24
14.0000 mg | MEDICATED_PATCH | Freq: Every day | TRANSDERMAL | Status: DC
  Administered 2012-09-08 – 2012-09-09 (×2): 14 mg via TRANSDERMAL
  Filled 2012-09-05 (×8): qty 1

## 2012-09-05 MED ORDER — HALOPERIDOL 5 MG PO TABS: 5.0000 mg | ORAL_TABLET | Freq: Every day | ORAL | Status: DC

## 2012-09-05 MED ORDER — FLUOXETINE HCL 20 MG PO TABS
10.0000 mg | ORAL_TABLET | Freq: Every day | ORAL | Status: DC
  Filled 2012-09-05 (×2): qty 1

## 2012-09-05 NOTE — ED Provider Notes (Signed)
Medical screening examination/treatment/procedure(s) were performed by non-physician practitioner and as supervising physician I was immediately available for consultation/collaboration.  Henry Utsey R. Jakobi Thetford, MD 09/05/12 2331 

## 2012-09-05 NOTE — ED Notes (Addendum)
Pt from [facility], paperwork states pt is known substance abuser- crack, alcohol, marijuana. Depression, SI. Planning to OD on meds. Needs med clearance for placement. Has placement already sent up once medically cleared and sent back to [facility]. Currently on Haldol, Prozac (not taking Prozac). Reports 15 pound weight loss in past 2 months. Denies physical pain.  Upon rn assessment pt reports occasionally drinking, 3x per week. Last ETOH yesterday. Pt used crack and marijuana yesterday. Pt reports depression x3 months. Pt reports AH, voices telling her to kill herself. Reports has heard voices x1 year from PTSD. Her husband was murdered.

## 2012-09-05 NOTE — Progress Notes (Signed)
The focus of this group is to help patients review their daily goal of treatment and discuss progress on daily workbooks. Pt attended the evening group session, despite having just arrived on the unit, and responded to all discussion prompts from the Writer. Pt reported having a good day and being happy that she "finally made the decision to get help for my mental health issues." Pt had several questions related to smoking and length of stay. Pt's affect was pleasant.

## 2012-09-05 NOTE — ED Provider Notes (Signed)
History    This chart was scribed for Roxy Horseman, PA working with Juliet Rude. Rubin Payor, MD by ED Scribe, Burman Nieves. This patient was seen in room WTR4/WLPT4 and the patient's care was started at 4:43 PM.   CSN: [id number]  Arrival date & time 09/05/12  1619   First MD Initiated Contact with Patient 09/05/12 1643      Chief Complaint  Patient presents with  . Medical Clearance    (Consider location/radiation/quality/duration/timing/severity/associated sxs/prior treatment) The history is provided by the patient. No language interpreter was used.   HPI Comments: Diane Rosario is a 37 y.o. female with h/o bipolar disorder and depression who presents to the Emergency Department complaining of of having suicidal ideations. Pt states that she has been having suicidal thoughts due to her mental condition. Security officers state that pt was sent here from [place] to get lab work done then is being sent off for detox. Pt denies HI, hallucinations, headache, chest pain, dysuria, fever, chills, cough, nausea, vomiting, diarrhea, SOB, weakness, and any other associated symptoms.   Past Medical History  Diagnosis Date  . Bipolar 1 disorder     pt reports bipolar  . Depression     History reviewed. No pertinent past surgical history.  History reviewed. No pertinent family history.  History  Substance Use Topics  . Smoking status: Current Every Day Smoker -- 0.25 packs/day for 10 years    Types: Cigarettes  . Smokeless tobacco: Not on file  . Alcohol Use: Yes     Comment: occasionally    OB History   Grav Para Term Preterm Abortions TAB SAB Ect Mult Living                  Review of Systems  Psychiatric/Behavioral: Positive for suicidal ideas.  All other systems reviewed and are negative.    Allergies  Review of patient's allergies indicates no known allergies.  Home Medications   Current Outpatient Rx  Name  Route  Sig  Dispense  Refill  . FLUoxetine (PROZAC) 10  MG tablet   Oral   Take 10 mg by mouth daily.         . haloperidol (HALDOL) 5 MG tablet   Oral   Take 5 mg by mouth at bedtime.         . Multiple Vitamin (MULTIVITAMIN WITH MINERALS) TABS   Oral   Take 1 tablet by mouth at bedtime.           BP 107/70  Pulse 82  Temp(Src) 98.1 F (36.7 C) (Oral)  Resp 20  SpO2 100%  Physical Exam  Nursing note and vitals reviewed. Constitutional: She is oriented to person, place, and time. She appears well-developed and well-nourished. No distress.  Pt is currently calm and cooperative.  HENT:  Head: Normocephalic and atraumatic.  Eyes: EOM are normal.  Neck: Neck supple. No tracheal deviation present.  Cardiovascular: Normal rate, regular rhythm, normal heart sounds and intact distal pulses.  Exam reveals no gallop and no friction rub.   No murmur heard. Pulmonary/Chest: Effort normal and breath sounds normal. No respiratory distress. She has no wheezes. She has no rales. She exhibits no tenderness.  Abdominal: Soft. Bowel sounds are normal. She exhibits no distension and no mass. There is no tenderness. There is no rebound and no guarding.  Musculoskeletal: Normal range of motion.  Moves all extremities.   Neurological: She is alert and oriented to person, place, and time.  CN 3-12  intact, sensation and strength intact bilaterally.   Skin: Skin is warm and dry.  Psychiatric: She has a normal mood and affect. Her behavior is normal.    ED Course  Procedures (including critical care time) DIAGNOSTIC STUDIES: Oxygen Saturation is 100% on room air, normal by my interpretation.    COORDINATION OF CARE: 5:42 PM Discussed ED treatment with pt and pt agrees.  5:51 PM Pt is medically cleared, giving pt azithromycin for trich and will be transported back to [facility].  Labs Reviewed  ACETAMINOPHEN LEVEL  CBC  COMPREHENSIVE METABOLIC PANEL  ETHANOL  SALICYLATE LEVEL  URINE RAPID DRUG SCREEN (HOSP PERFORMED)  URINALYSIS, ROUTINE W  REFLEX MICROSCOPIC  POCT PREGNANCY, URINE   Results for orders placed during the hospital encounter of 09/05/12  ACETAMINOPHEN LEVEL      Result Value Range   Acetaminophen (Tylenol), Serum <15.0  10 - 30 ug/mL  CBC      Result Value Range   WBC 6.1  4.0 - 10.5 K/uL   RBC 3.85 (*) 3.87 - 5.11 MIL/uL   Hemoglobin 12.4  12.0 - 15.0 g/dL   HCT 96.0  45.4 - 09.8 %   MCV 95.6  78.0 - 100.0 fL   MCH 32.2  26.0 - 34.0 pg   MCHC 33.7  30.0 - 36.0 g/dL   RDW 11.9  14.7 - 82.9 %   Platelets 316  150 - 400 K/uL  COMPREHENSIVE METABOLIC PANEL      Result Value Range   Sodium 136  135 - 145 mEq/L   Potassium 3.8  3.5 - 5.1 mEq/L   Chloride 102  96 - 112 mEq/L   CO2 25  19 - 32 mEq/L   Glucose, Bld 88  70 - 99 mg/dL   BUN 7  6 - 23 mg/dL   Creatinine, Ser 5.62  0.50 - 1.10 mg/dL   Calcium 9.1  8.4 - 13.0 mg/dL   Total Protein 7.4  6.0 - 8.3 g/dL   Albumin 3.6  3.5 - 5.2 g/dL   AST 14  0 - 37 U/L   ALT 10  0 - 35 U/L   Alkaline Phosphatase 50  39 - 117 U/L   Total Bilirubin 0.3  0.3 - 1.2 mg/dL   GFR calc non Af Amer >90  >90 mL/min   GFR calc Af Amer >90  >90 mL/min  ETHANOL      Result Value Range   Alcohol, Ethyl (B) <11  0 - 11 mg/dL  SALICYLATE LEVEL      Result Value Range   Salicylate Lvl <2.0 (*) 2.8 - 20.0 mg/dL  URINE RAPID DRUG SCREEN (HOSP PERFORMED)      Result Value Range   Opiates NONE DETECTED  NONE DETECTED   Cocaine POSITIVE (*) NONE DETECTED   Benzodiazepines NONE DETECTED  NONE DETECTED   Amphetamines NONE DETECTED  NONE DETECTED   Tetrahydrocannabinol POSITIVE (*) NONE DETECTED   Barbiturates NONE DETECTED  NONE DETECTED  URINALYSIS, ROUTINE W REFLEX MICROSCOPIC      Result Value Range   Color, Urine YELLOW  YELLOW   APPearance CLOUDY (*) CLEAR   Specific Gravity, Urine 1.023  1.005 - 1.030   pH 6.5  5.0 - 8.0   Glucose, UA NEGATIVE  NEGATIVE mg/dL   Hgb urine dipstick NEGATIVE  NEGATIVE   Bilirubin Urine NEGATIVE  NEGATIVE   Ketones, ur NEGATIVE   NEGATIVE mg/dL   Protein, ur NEGATIVE  NEGATIVE mg/dL  Urobilinogen, UA 0.2  0.0 - 1.0 mg/dL   Nitrite NEGATIVE  NEGATIVE   Leukocytes, UA LARGE (*) NEGATIVE  URINE MICROSCOPIC-ADD ON      Result Value Range   Squamous Epithelial / LPF FEW (*) RARE   WBC, UA 11-20  <3 WBC/hpf   Bacteria, UA MANY (*) RARE   Urine-Other MUCOUS PRESENT    POCT PREGNANCY, URINE      Result Value Range   Preg Test, Ur NEGATIVE  NEGATIVE   Dg Chest 2 View  09/05/2012   *RADIOLOGY REPORT*  Clinical Data: Cough, weight loss, smoker  CHEST - 2 VIEW  Comparison: 01/04/2010  Findings: Stable hyperinflation.  Normal heart size and vascularity.  No CHF or pneumonia.  Negative for effusion or pneumothorax.  Trachea is midline.  Lungs remain clear.  No osseous abnormality.  IMPRESSION: Hyperinflation without acute chest process   Original Report Authenticated By: Judie Petit. Shick, M.D.       1. Medical clearance for psychiatric admission   2. Trichimoniasis       MDM  Patient is currently being treated at [facility] for SI. She is sent to the emergency department for medical clearance. She is medically clear at this time. I will discharge back to [facility]. Patient is stable and ready for discharge.      I personally performed the services described in this documentation, which was scribed in my presence. The recorded information has been reviewed and is accurate.     Roxy Horseman, PA-C 09/05/12 2039

## 2012-09-05 NOTE — ED Notes (Addendum)
Pt escorted to discharge window. Pt verbalized understanding discharge instructions. In no acute distress.   Report called to Debbie,rn at [facility]

## 2012-09-05 NOTE — Progress Notes (Addendum)
Patient ID: Diane Rosario, female   DOB: November 18, 1975, 37 y.o.   MRN: [id number] Pt denies SI/HI/AVH.  Pt denies any history of physical, verbal, or sexual abuse.  Pt admitted today IVC due to SI with a plan to OD.  Pt states that she was going to OD on her home medications.  Pt states that she has been having command hallucinations telling her to hurt herself.  Pt states that she has been non compliant with her home medications for 1 week.  Pt does admit to using cocaine and THC.  She states that he last usage was 3-4 days ago.  Pt cooperative.  Pt oriented to unit. Pt is malnourished.  She does admit to losing an unknown amount of weight recently due to decreased appetite.

## 2012-09-05 NOTE — BH Assessment (Signed)
Assessment Note   Diane Rosario is an 37 y.o. female. Pt seen by [facility] team and brought to [facility]. Placed on IVC, sent to ED for Medical clearance. Pt reports depression, AH with commands to kill herself and others. No history of harm to others and no stated plan. Pt has had suicide attempts, last 4 months ago. Pt is prescribed Prozac, risperdal and haldol but is non compliant. Pt has recurring thoughts of husband being murdered (not recent). Pt reports poor sleep, poor appetite (15lb wt loss). Reports alcohol and nicotine use, UDS also positive for cocaine and THC. She reports the whole world is against her. Pt is agitated distracted and needs redirection. Accepted for admission to inpatient hospitalization by Armandina Stammer NP.  Axis I: Major Depression, Recurrent severe and with psychosis Axis II: Deferred Axis III:  Past Medical History  Diagnosis Date  . Bipolar 1 disorder     pt reports bipolar, pt reports Auditory hallucinations  . Depression   . PTSD (post-traumatic stress disorder)     husband was murdered   Axis IV: other psychosocial or environmental problems and problems with access to health care services Axis V: 21-30 behavior considerably influenced by delusions or hallucinations OR serious impairment in judgment, communication OR inability to function in almost all areas  Past Medical History:  Past Medical History  Diagnosis Date  . Bipolar 1 disorder     pt reports bipolar, pt reports Auditory hallucinations  . Depression   . PTSD (post-traumatic stress disorder)     husband was murdered    History reviewed. No pertinent past surgical history.  Family History: History reviewed. No pertinent family history.  Social History:  reports that she has been smoking Cigarettes.  She has a 2.5 pack-year smoking history. She does not have any smokeless tobacco history on file. She reports that  drinks alcohol. She reports that she uses illicit  drugs (Marijuana and Cocaine).  Additional Social History:  Alcohol / Drug Use Pain Medications: denies abuse Prescriptions: denies abues Over the Counter: denies abuse History of alcohol / drug use?: Yes Substance #1 Name of Substance 1: Cannibus 1 - Age of First Use: unknown 1 - Amount (size/oz): unknown 1 - Frequency: unknown 1 - Duration: unknown  1 - Last Use / Amount: UDS Pos in ED 09/05/2012 Substance #2 Name of Substance 2: alcohol 2 - Age of First Use: teens 2 - Amount (size/oz): not specified 2 - Frequency: 3 x month 2 - Duration: years 2 - Last Use / Amount: 09/05/2012, BAL <11 in ED Substance #3 Name of Substance 3: cocaine 3 - Age of First Use: unknown 3 - Amount (size/oz): unknown 3 - Frequency: unknown 3 - Duration: unknown 3 - Last Use / Amount: UDS Pos in ED 09/05/2012  CIWA: CIWA-Ar BP: 113/81 mmHg Pulse Rate: 86 COWS:    Allergies: No Known Allergies  Home Medications:  Medications Prior to Admission  Medication Sig Dispense Refill  . FLUoxetine (PROZAC) 10 MG tablet Take 10 mg by mouth daily.      . haloperidol (HALDOL) 5 MG tablet Take 5 mg by mouth at bedtime.      . Multiple Vitamin (MULTIVITAMIN WITH MINERALS) TABS Take 1 tablet by mouth at bedtime.        OB/GYN Status:  Patient's last menstrual period was 08/06/2012.  General Assessment Data Location of Assessment: [facility] Assessment Services Living Arrangements: Parent (mother) Can pt return to current living arrangement?:  Yes Admission Status: Involuntary Is patient capable of signing voluntary admission?: No Transfer from: [facility] Referral Source: [facility])  Education Status Is patient currently in school?: No Highest grade of school patient has completed: 11 Contact person: mother  Risk to self Suicidal Ideation: Yes-Currently Present Suicidal Intent: Yes-Currently Present Is patient at risk for suicide?: Yes Suicidal Plan?: Yes-Currently Present Specify Current  Suicidal Plan: OD Access to Means: Yes Specify Access to Suicidal Means: medications What has been your use of drugs/alcohol within the last 12 months?: pt reports alcohol and nicotine (UDS pos for cocaine and THC) Previous Attempts/Gestures: Yes How many times?: 3 Other Self Harm Risks: command AH Triggers for Past Attempts: Other (Comment) (illness) Intentional Self Injurious Behavior: None Family Suicide History: Unknown Recent stressful life event(s): Other (Comment) (AH) Persecutory voices/beliefs?: Yes Depression: Yes Depression Symptoms: Feeling worthless/self pity;Feeling angry/irritable Substance abuse history and/or treatment for substance abuse?: Yes Suicide prevention information given to non-admitted patients: Not applicable  Risk to Others Homicidal Ideation: Yes-Currently Present Thoughts of Harm to Others: Yes-Currently Present Comment - Thoughts of Harm to Others: command AH to harm others Current Homicidal Intent: No Current Homicidal Plan: No Access to Homicidal Means: No Identified Victim: mother? History of harm to others?: No Assessment of Violence: On admission Violent Behavior Description: agitation Does patient have access to weapons?: No Criminal Charges Pending?: No Does patient have a court date: No  Psychosis Hallucinations: Auditory;With command Delusions: Unspecified  Mental Status Report Appear/Hygiene: Disheveled Eye Contact: Fair Motor Activity: Restlessness Speech: Pressured;Tangential Level of Consciousness: Alert;Irritable Mood: Depressed;Apprehensive Affect: Depressed;Apprehensive Anxiety Level: Minimal Thought Processes: Circumstantial;Tangential Judgement: Impaired Orientation: Person;Place;Time;Situation Obsessive Compulsive Thoughts/Behaviors: None  Cognitive Functioning Concentration: Decreased Memory: Recent Intact;Remote Intact IQ: Average Insight: Fair Impulse Control: Poor Appetite: Poor Weight Loss: 15 Weight  Gain: 0 Sleep: Decreased Vegetative Symptoms: None  ADLScreening [facility] Assessment Services) Patient's cognitive ability adequate to safely complete daily activities?: Yes Patient able to express need for assistance with ADLs?: Yes Independently performs ADLs?: Yes (appropriate for developmental age)  Abuse/Neglect [facility]) Physical Abuse: Denies Verbal Abuse: Denies Sexual Abuse: Denies  Prior Inpatient Therapy Prior Inpatient Therapy: Yes Prior Therapy Dates:  (3 x) Prior Therapy Facilty/Provider(s): not specified Reason for Treatment: suicide attempts psychosis  Prior Outpatient Therapy Prior Outpatient Therapy: Yes Prior Therapy Dates: current Prior Therapy Facilty/Provider(s): [facility] Reason for Treatment: depression  ADL Screening (condition at time of admission) Patient's cognitive ability adequate to safely complete daily activities?: Yes Patient able to express need for assistance with ADLs?: Yes Independently performs ADLs?: Yes (appropriate for developmental age) Weakness of Legs: None Weakness of Arms/Hands: None  Home Assistive Devices/Equipment Home Assistive Devices/Equipment: None  Therapy Consults (therapy consults require a physician order) PT Evaluation Needed: No OT Evalulation Needed: No SLP Evaluation Needed: No Abuse/Neglect Assessment (Assessment to be complete while patient is alone) Physical Abuse: Denies Verbal Abuse: Denies Sexual Abuse: Denies Exploitation of patient/patient's resources: Denies Self-Neglect: Denies Values / Beliefs Cultural Requests During Hospitalization: None Spiritual Requests During Hospitalization: None Consults Spiritual Care Consult Needed: No Social Work Consult Needed: No Merchant navy officer (For Healthcare) Advance Directive: Patient does not have advance directive;Patient would not like information Pre-existing out of facility DNR order (yellow form or pink MOST form): No Nutrition Screen- [facility]  Adult/WL/AP Patient's home diet: Regular Have you recently lost weight without trying?: Yes If yes, how much weight have you lost?: 14-23 lb Have you been eating poorly because of a decreased appetite?: Yes Malnutrition Screening  Tool Score: 3  Additional Information 1:1 In Past 12 Months?: Yes (at [facility]) CIRT Risk: Yes Elopement Risk: No Does patient have medical clearance?: Yes     Disposition:  Disposition Initial Assessment Completed for this Encounter: Yes Disposition of Patient: Inpatient treatment program Type of inpatient treatment program: Adult  On Site Evaluation by:   Reviewed with Physician:     Conan Bowens 09/05/2012 8:06 PM

## 2012-09-05 NOTE — Tx Team (Signed)
Initial Interdisciplinary Treatment Plan  PATIENT STRENGTHS: (choose at least two) Ability for insight General fund of knowledge Motivation for treatment/growth Supportive family/friends  PATIENT STRESSORS: Medication change or noncompliance   PROBLEM LIST: Problem List/Patient Goals Date to be addressed Date deferred Reason deferred Estimated date of resolution  Suicidal Ideation 09/05/12     Depression 09/05/12     Audio Hallucinations 09/05/12                                          DISCHARGE CRITERIA:  Improved stabilization in mood, thinking, and/or behavior  PRELIMINARY DISCHARGE PLAN: Outpatient therapy  PATIENT/FAMIILY INVOLVEMENT: This treatment plan has been presented to and reviewed with the patient, Diane Rosario, and/or family member.  The patient and family have been given the opportunity to ask questions and make suggestions.  Diane Rosario [facility] 09/05/2012, 8:01 PM

## 2012-09-06 ENCOUNTER — Encounter (HOSPITAL_COMMUNITY): Payer: Self-pay | Admitting: Psychiatry

## 2012-09-06 DIAGNOSIS — F1410 Cocaine abuse, uncomplicated: Secondary | ICD-10-CM

## 2012-09-06 DIAGNOSIS — F333 Major depressive disorder, recurrent, severe with psychotic symptoms: Principal | ICD-10-CM

## 2012-09-06 DIAGNOSIS — F1210 Cannabis abuse, uncomplicated: Secondary | ICD-10-CM

## 2012-09-06 DIAGNOSIS — F4310 Post-traumatic stress disorder, unspecified: Secondary | ICD-10-CM

## 2012-09-06 LAB — TSH: TSH: 0.725 u[IU]/mL (ref 0.350–4.500)

## 2012-09-06 LAB — LIPID PANEL
Cholesterol: 192 mg/dL (ref 0–200)
HDL: 113 mg/dL (ref >39)
LDL Cholesterol: 71 mg/dL (ref 0–99)
Total CHOL/HDL Ratio: 1.7 RATIO
Triglycerides: 41 mg/dL (ref <150)
VLDL: 8 mg/dL (ref 0–40)

## 2012-09-06 LAB — PREALBUMIN: Prealbumin: 27.1 mg/dL (ref 17.0–34.0)

## 2012-09-06 MED ORDER — BENZTROPINE MESYLATE 1 MG PO TABS
1.0000 mg | ORAL_TABLET | Freq: Every day | ORAL | Status: DC
  Administered 2012-09-06 – 2012-09-10 (×5): 1 mg via ORAL
  Filled 2012-09-06 (×5): qty 1
  Filled 2012-09-06: qty 14
  Filled 2012-09-06: qty 1

## 2012-09-06 MED ORDER — FLUOXETINE HCL 10 MG PO CAPS
10.0000 mg | ORAL_CAPSULE | Freq: Every day | ORAL | Status: DC
  Administered 2012-09-06 – 2012-09-11 (×6): 10 mg via ORAL
  Filled 2012-09-06: qty 1
  Filled 2012-09-06: qty 14
  Filled 2012-09-06 (×7): qty 1

## 2012-09-06 MED ORDER — HALOPERIDOL 5 MG PO TABS
5.0000 mg | ORAL_TABLET | Freq: Every day | ORAL | Status: DC
  Administered 2012-09-06 – 2012-09-10 (×5): 5 mg via ORAL
  Filled 2012-09-06 (×2): qty 1
  Filled 2012-09-06: qty 14
  Filled 2012-09-06 (×4): qty 1

## 2012-09-06 MED ORDER — ENSURE COMPLETE PO LIQD
237.0000 mL | Freq: Three times a day (TID) | ORAL | Status: DC
  Administered 2012-09-06 – 2012-09-10 (×9): 237 mL via ORAL

## 2012-09-06 NOTE — [facility] Suicide Risk Assessment (Signed)
Suicide Risk Assessment  Admission Assessment     Nursing information obtained from:  Patient Demographic factors:  Divorced or widowed;Unemployed Current Mental Status:    Loss Factors:    Historical Factors:  Family history of mental illness or substance abuse Risk Reduction Factors:  Living with another person, especially a relative  CLINICAL FACTORS:   Depression:   Anhedonia Comorbid alcohol abuse/dependence Hopelessness Impulsivity Insomnia Alcohol/Substance Abuse/Dependencies Currently Psychotic Previous Psychiatric Diagnoses and Treatments  COGNITIVE FEATURES THAT CONTRIBUTE TO RISK:  Closed-mindedness Polarized thinking    SUICIDE RISK:  1. Admit for crisis management and stabilization. 2. Medication management to reduce current symptoms to base line and improve the     patient's overall level of functioning 3. Treat health problems as indicated. 4. Develop treatment plan to decrease risk of relapse upon discharge and the need for     readmission. 5. Psycho-social education regarding relapse prevention and self care. 6. Health care follow up as needed for medical problems. 7. Restart home medications where appropriate.  Mild:  Suicidal ideation of limited frequency, intensity, duration, and specificity.  There are no identifiable plans, no associated intent, mild dysphoria and related symptoms, good self-control (both objective and subjective assessment), few other risk factors, and identifiable protective factors, including available and accessible social support.  PLAN OF CARE:  I certify that inpatient services furnished can reasonably be expected to improve the patient's condition.  Catharine Kettlewell,MD 09/06/2012, 11:09 AM

## 2012-09-06 NOTE — H&P (Signed)
Psychiatric Admission Assessment Adult  Patient Identification:  Diane Rosario Date of Evaluation:  09/06/2012 Chief Complaint:  "I have been hearing voices telling me to hurt my self" History of Present Illness::Patient is a 37 year old widow who report history of Bipolar depression and PTSD diagnosed after her husband was murdered in 2007 Patient presents with depression, paranoia, suicidal thoughts and command auditory hallucinations. Patient reports that she attempted suicide by overdose four months ago. Patient reports that he is followed by [facility] team who took her to [facility] mental health clinic for evaluation before she was brought to [facility] for admission.She reports  recurring thoughts of husband being murdered. Pt reports poor sleep, poor appetite (15lb wt loss). Reports alcohol and nicotine use, UDS also positive for cocaine and THC. She reports the whole world is against her.  Elements:  Location:  [facility] inpatient service. Associated Signs/Synptoms: Depression Symptoms:  depressed mood, anhedonia, insomnia, psychomotor agitation, feelings of worthlessness/guilt, difficulty concentrating, hopelessness, recurrent thoughts of death, anxiety, loss of energy/fatigue, (Hypo) Manic Symptoms:  Delusions, Hallucinations, Anxiety Symptoms:  Excessive Worry, Psychotic Symptoms:  Delusions, Hallucinations: Auditory PTSD Symptoms: Had a traumatic exposure:  Husband was murdered  Psychiatric Specialty Exam: Physical Exam  Psychiatric: Her affect is blunt. Her speech is delayed. She is slowed, withdrawn and actively hallucinating. Thought content is paranoid and delusional. Cognition and memory are normal. She expresses impulsivity and inappropriate judgment. She exhibits a depressed mood.    Review of Systems  Constitutional: Positive for weight loss and malaise/fatigue.  HENT: Negative.   Eyes: Negative.   Respiratory: Negative.   Cardiovascular: Negative.    Gastrointestinal: Negative.   Musculoskeletal: Negative.   Skin: Negative.   Neurological: Negative.   Endo/Heme/Allergies: Negative.   Psychiatric/Behavioral: Positive for depression, suicidal ideas, hallucinations and substance abuse. The patient has insomnia.     Blood pressure 107/81, pulse 82, temperature 98.5 F (36.9 C), temperature source Oral, resp. rate 15, height 5' 5.5" (1.664 m), weight 39.009 kg (86 lb), last menstrual period 08/06/2012.Body mass index is 14.09 kg/(m^2).  General Appearance: Disheveled  Eye Contact::  Minimal  Speech:  Slow  Volume:  Decreased  Mood:  Anxious and Depressed  Affect:  Blunt  Thought Process:  Goal Directed  Orientation:  Full (Time, Place, and Person)  Thought Content:  Delusions, Hallucinations: Auditory and Paranoid Ideation  Suicidal Thoughts:  Yes.  without intent/plan  Homicidal Thoughts:  No  Memory:  Immediate;   Fair Recent;   Fair Remote;   Fair  Judgement:  Poor  Insight:  Lacking  Psychomotor Activity:  Decreased  Concentration:  Fair  Recall:  Fair  Akathisia:  No  Handed:  Ambidextrous  AIMS (if indicated):     Assets:  Communication Skills Desire for Improvement Social Support  Sleep:  Number of Hours: 6.75    Past Psychiatric History: Diagnosis: PTSD, Bipolar depression  Hospitalizations:  [facility]  Substance Abuse Care:  Self-Mutilation:  Suicidal Attempts:once about 4 months ago  Violent Behaviors:   Past Medical History:   Past Medical History  Diagnosis Date  . Bipolar 1 disorder     pt reports bipolar, pt reports Auditory hallucinations  . Depression   . PTSD (post-traumatic stress disorder)     husband was murdered    Allergies:  No Known Allergies PTA Medications: Prescriptions prior to admission  Medication Sig Dispense Refill  . FLUoxetine (PROZAC) 10 MG tablet Take 10 mg by mouth daily.      Marland Kitchen  haloperidol (HALDOL) 5 MG tablet Take 5 mg by mouth at bedtime.       . Multiple Vitamin (MULTIVITAMIN WITH MINERALS) TABS Take 1 tablet by mouth at bedtime.        Previous Psychotropic Medications:  Medication/Dose: Prozac, Haldol                 Substance Abuse History in the last 12 months:  yes  Consequences of Substance Abuse: NA  Social History:  reports that she has been smoking Cigarettes.  She has a 2.5 pack-year smoking history. She does not have any smokeless tobacco history on file. She reports that  drinks alcohol. She reports that she uses illicit drugs (Marijuana and Cocaine). Additional Social History: Pain Medications: denies abuse Prescriptions: denies abues Over the Counter: denies abuse History of alcohol / drug use?: Yes Name of Substance 1: Cannibus 1 - Age of First Use: unknown 1 - Amount (size/oz): unknown 1 - Frequency: unknown 1 - Duration: unknown  1 - Last Use / Amount: UDS Pos in ED 09/05/2012 Name of Substance 2: alcohol 2 - Age of First Use: teens 2 - Amount (size/oz): not specified 2 - Frequency: 3 x month 2 - Duration: years 2 - Last Use / Amount: 09/05/2012, BAL <11 in ED Name of Substance 3: cocaine 3 - Age of First Use: unknown 3 - Amount (size/oz): unknown 3 - Frequency: unknown 3 - Duration: unknown 3 - Last Use / Amount: UDS Pos in ED 09/05/2012              Current Place of Residence:   Place of Birth:   Family Members: Marital Status:  Widowed Children:  Sons:  Daughters: Relationships: Education:  11th grade Educational Problems/Performance: Religious Beliefs/Practices: History of Abuse (Emotional/Phsycial/Sexual) Teacher, music History:  None. Legal History: Hobbies/Interests:  Family History:  History reviewed. No pertinent family history.  Results for orders placed during the hospital encounter of 09/05/12 (from the past 72 hour(s))  LIPID PANEL     Status: None   Collection Time    09/06/12  6:20 AM      Result Value Range   Cholesterol 192  0 -  200 mg/dL   Triglycerides 41  <161 mg/dL   HDL 096  >04 mg/dL   Total CHOL/HDL Ratio 1.7     VLDL 8  0 - 40 mg/dL   LDL Cholesterol 71  0 - 99 mg/dL   Comment:            Total Cholesterol/HDL:CHD Risk     Coronary Heart Disease Risk Table                         Men   Women      1/2 Average Risk   3.4   3.3      Average Risk       5.0   4.4      2 X Average Risk   9.6   7.1      3 X Average Risk  23.4   11.0                Use the calculated Patient Ratio     above and the CHD Risk Table     to determine the patient's CHD Risk.                ATP III CLASSIFICATION (LDL):      <100  mg/dL   Optimal      161-096  mg/dL   Near or Above                        Optimal      130-159  mg/dL   Borderline      045-409  mg/dL   High      >811     mg/dL   Very High   Psychological Evaluations:  Assessment:   AXIS I:  Major depressive disorder, recurrent episode, severe, specified as with psychotic behavior. PTSD              Cannabis use disorder. Cocaine abuse AXIS II:  Deferred AXIS III:   Past Medical History  Diagnosis Date  . Bipolar 1 disorder     pt reports bipolar, pt reports Auditory hallucinations  . Depression   . PTSD (post-traumatic stress disorder)     husband was murdered   AXIS IV:  other psychosocial or environmental problems and problems related to social environment AXIS V:  11-20 some danger of hurting self or others possible OR occasionally fails to maintain minimal personal hygiene OR gross impairment in communication  Treatment Plan/Recommendations:  1. Admit for crisis management and stabilization. 2. Medication management to reduce current symptoms to base line and improve the     patient's overall level of functioning 3. Treat health problems as indicated. 4. Develop treatment plan to decrease risk of relapse upon discharge and the need for     readmission. 5. Psycho-social education regarding relapse prevention and self care. 6. Health care follow  up as needed for medical problems. 7. Restart home medications where appropriate.   Treatment Plan Summary: Daily contact with patient to assess and evaluate symptoms and progress in treatment Medication management Current Medications:  Current Facility-Administered Medications  Medication Dose Route Frequency Provider Last Rate Last Dose  . acetaminophen (TYLENOL) tablet 650 mg  650 mg Oral Q6H PRN Kerry Hough, PA-C      . alum & mag hydroxide-simeth (MAALOX/MYLANTA) 200-200-20 MG/5ML suspension 30 mL  30 mL Oral Q4H PRN Kerry Hough, PA-C      . benztropine (COGENTIN) tablet 1 mg  1 mg Oral QHS Anoushka Divito      . FLUoxetine (PROZAC) capsule 10 mg  10 mg Oral Daily Nyssa Sayegh   10 mg at 09/06/12 0811  . haloperidol (HALDOL) tablet 5 mg  5 mg Oral QHS Aylee Littrell      . magnesium hydroxide (MILK OF MAGNESIA) suspension 30 mL  30 mL Oral Daily PRN Kerry Hough, PA-C      . multivitamin with minerals tablet 1 tablet  1 tablet Oral QHS Kerry Hough, PA-C   1 tablet at 09/05/12 2114  . nicotine (NICODERM CQ - dosed in mg/24 hours) patch 14 mg  14 mg Transdermal Q0600 Kerry Hough, PA-C      . traZODone (DESYREL) tablet 50 mg  50 mg Oral QHS,MR X 1 Kerry Hough, PA-C   50 mg at 09/05/12 2114    Observation Level/Precautions:  Routine  Laboratory:  routine  Psychotherapy:    Medications:    Consultations:    Discharge Concerns:    Estimated LOS:5-7 days  Other:     I certify that inpatient services furnished can reasonably be expected to improve the patient's condition.   Nakeisha Greenhouse,MD 5/29/201411:10 AM

## 2012-09-06 NOTE — Progress Notes (Signed)
D:  Patient's self inventory sheet, patient has fair sleep, poor appetite, low energy level, good attention span.  Rated depression #6, hopelessness #4.  Has experienced tremors, sedation, cravings, agitation in past 24 hours.  SI off/on, contracts for safety.  Worst pain #6.  Plans to take medication after discharge.  Does have discharge plans.  No problems taking meds after discharge. A:  Medications administered per MD orders.  Emotional support and encouragement given to patient. R:  Denied HI.  Denied visual hallucinations.  Has hard voices this morning telling her to hurt herself, hears whispers/mumblings.

## 2012-09-06 NOTE — B[facility] Counselor (Signed)
Adult Comprehensive Assessment  Patient ID: ANNALUCIA LAINO, female   DOB: 04/15/1975, 37 y.o.   MRN: [id number]  Information Source: Information source: Patient  Current Stressors:  Educational / Learning stressors: N/A Employment / Job issues: Yes  No income Family Relationships: N/A Surveyor, quantity / Lack of resources (include bankruptcy): Yes  No income Housing / Lack of housing: N/A Physical health (include injuries & life threatening diseases): N/A Social relationships: Yes  Few Substance abuse: Yes  Cannabis regularly, cocaine periodically Bereavement / Loss: Yes  Loss of husband  Living/Environment/Situation:  Living Arrangements: Parent (mother) Living conditions (as described by patient or guardian): good How long has patient lived in current situation?: "awhile now" What is atmosphere in current home: Comfortable;Supportive  Family History:  Marital status: Widowed Widowed, when?: 2007 Does patient have children?: Yes How many children?: 1 How is patient's relationship with their children?: good  He is in the [employer]  Childhood History:  By whom was/is the patient raised?: Mother Additional childhood history information: father not in the picture Description of patient's relationship with caregiver when they were a child: good Patient's description of current relationship with people who raised him/her: good Does patient have siblings?: Yes Number of Siblings: 2 Description of patient's current relationship with siblings: good,  both sisters have mentl illness as well Did patient suffer any verbal/emotional/physical/sexual abuse as a child?: No Did patient suffer from severe childhood neglect?: No Has patient ever been sexually abused/assaulted/raped as an adolescent or adult?: No Was the patient ever a victim of a crime or a disaster?: No Witnessed domestic violence?: No Has patient been effected by domestic violence as an adult?: No  Education:  Highest grade of  school patient has completed: 11 Currently a student?: No Contact person: mother Learning disability?: No  Employment/Work Situation:   Employment situation: Unemployed Patient's job has been impacted by current illness: No Has patient ever been in the [employer]?: No Has patient ever served in Buyer, retail?: No  Financial Resources:   Surveyor, quantity resources: Support from parents / caregiver Does patient have a Lawyer or guardian?: No  Alcohol/Substance Abuse:   What has been your use of drugs/alcohol within the last 12 months?: daily THC use, or "as often as I can get it"  Cocaine 1X per week If attempted suicide, did drugs/alcohol play a role in this?: No Alcohol/Substance Abuse Treatment Hx: Denies past history Has alcohol/substance abuse ever caused legal problems?: No  Social Support System:   Patient's Community Support System: Good Describe Community Support System: family Type of faith/religion: Ephriam Knuckles How does patient's faith help to cope with current illness?: not sure  Leisure/Recreation:   Leisure and Hobbies: read  Strengths/Needs:   What things does the patient do well?: clean In what areas does patient struggle / problems for patient: unsure  Discharge Plan:   Does patient have access to transportation?: Yes Will patient be returning to same living situation after discharge?: Yes Currently receiving community mental health services: Yes (From Whom) ([facility]) Does patient have financial barriers related to discharge medications?: Yes Patient description of barriers related to discharge medications: no insurance, no income  Summary/Recommendations:   Summary and Recommendations (to be completed by the evaluator): Moesha is a 37 YO AA female who was admitted due to SI secondary to command hallucinations.  She admits to regular cannabis use, and initially stated that she had been taking her medication regularly, but later admitted that she had not.   Her ACT team states  that they have been unable to catch up with her since mid April to administer Decanoate injection.  She can benefit from crises stabilization, medication management, therapeutic milieu and coordination with ACT team.  [name] B. 09/06/2012

## 2012-09-06 NOTE — Tx Team (Signed)
  Interdisciplinary Treatment Plan Update   Date Reviewed:  09/06/2012  Time Reviewed:  8:20 AM  Progress in Treatment:   Attending groups: Yes Participating in groups: Yes Taking medication as prescribed: Yes  Tolerating medication: Yes Family/Significant other contact made: No Patient understands diagnosis: Yes As evidenced by asking for help with command hallucinations telling her to kill self Discussing patient identified problems/goals with staff: Yes  See initial plan Medical problems stabilized or resolved: Yes Denies suicidal/homicidal ideation: Yes  In tx team Patient has not harmed self or others: Yes  For review of initial/current patient goals, please see plan of care.  Estimated Length of Stay:  4-5 days  Reason for Continuation of Hospitalization: Hallucinations Medication stabilization Suicidal ideation  New Problems/Goals identified:  N/A  Discharge Plan or Barriers:   return home, follow up with [facility]  Additional Comments:  Diane Rosario is an 37 y.o. female. Pt seen by [facility] Crisis team and brought to [facility]. Placed on IVC, sent to ED for Medical clearance. Pt reports depression, AH with commands to kill herself and others. No history of harm to others and no stated plan. Pt has had suicide attempts, last 4 months ago. Pt is prescribed Prozac, risperdal and haldol but is non compliant. Pt has recurring thoughts of husband being murdered (not recent). Pt reports poor sleep, poor appetite (15lb wt loss). Reports alcohol and nicotine use, UDS also positive for cocaine and THC. She reports the whole world is against her. Pt is agitated distracted and needs redirection   Attendees:  Signature: Thedore Mins, MD 09/06/2012 8:20 AM   Signature: Richelle Ito, LCSW 09/06/2012 8:20 AM  Signature: Verne Spurr, PA 09/06/2012 8:20 AM  Signature: Quintella Reichert, RN 09/06/2012 8:20 AM  Signature:  09/06/2012 8:20 AM  Signature:  09/06/2012 8:20  AM  Signature:   09/06/2012 8:20 AM  Signature:    Signature:    Signature:    Signature:    Signature:    Signature:      Scribe for Treatment Team:   Richelle Ito, LCSW  09/06/2012 8:20 AM

## 2012-09-06 NOTE — Clinical Social Work Note (Signed)
[facility] Group Notes:  (Counselor/Nursing/MHT/Case Management/Adjunct)  02/23/2012 2:20 PM  Type of Therapy:  Group Therapy  Participation Level:  Minimal  Participation Quality:  Appropriate  Affect:  Flat  Cognitive:  Oriented  Insight:  Limited  Engagement in Group:  Limited  Engagement in Therapy:  Limited  Modes of Intervention:  Discussion, Exploration and Socialization  Summary of Progress/Problems: .balance: The topic for group was balance in life.  Pt participated in the discussion about when their life was in balance and out of balance and how this feels.  Pt discussed ways to get back in balance and short term goals they can work on to get where they want to be. Diane Rosario stated she is balanced today.  She becomes unbalanced when she does not take her meds, and when she isolates and does not reach out to others who can help.  Beyond that, she was not able to say why she does those things, or what leads up to them.   [name] B 02/23/2012, 2:20 PM

## 2012-09-06 NOTE — [facility] Group Notes (Signed)
[facility] LCSW Aftercare Discharge Planning Group Note   09/06/2012 8:21 AM  Participation Quality:  Engaged  Mood/Affect:  Flat  Depression Rating:  denies  Anxiety Rating:  denies  Thoughts of Suicide:  No Will you contract for safety?   NA  Current AVH:  No  But admits that she was hearing command hallucinations at admission to kill self  Plan for Discharge/Comments:  Diane Rosario states she needs to be here for help with PTSD and accompanying symptoms.  States her husband was murdered in 07, and that she has struggled since then.  Staying with mom.  Has applied for disability.  Will return home and follow up with [facility].  Transportation Means: family  Supports: family/ACT  [place]allo, [place] B

## 2012-09-06 NOTE — Progress Notes (Signed)
Patient ID: Diane Rosario, female   DOB: [birth year], 37 y.o.   MRN: [id number]  D: Pt endorses passive SI and passive VH, but pt does contract for safety.  Pt denies HI/AH. Pt is pleasant and cooperative. Pt was started on Feeding supplement today, was reluctant, but drank all the Ensure. Pt states she has craving for cigarettes, but refused patch. Pt seems really depressed and not invested in her treatment, but after talking with pt her mood improved and she appeared to be in better spirits  A: Pt was offered support and encouragement. Pt was given scheduled medications. Pt was encourage to attend groups. Q 15 minute checks were done for safety.   R:Pt attends groups and interacts well with peers and staff. Pt is taking medication. Pt has no complaints other than wanting a cigarette at this time.Pt receptive to treatment and safety maintained on unit.

## 2012-09-07 NOTE — Progress Notes (Signed)
Adult Psychoeducational Group Note  Date:  09/07/2012 Time:  8:00PM Group Topic/Focus:  Wrap-Up Group:   The focus of this group is to help patients review their daily goal of treatment and discuss progress on daily workbooks.  Participation Level:  Active  Participation Quality:  Appropriate and Attentive  Affect:  Appropriate  Cognitive:  Alert and Appropriate  Insight: Appropriate  Engagement in Group:  Engaged  Modes of Intervention:  Discussion  Additional Comments:  Pt. Was attentive and appropriate during tonight's group discussion. Pt had a hard time speaking about her addiction to drugs. Pt. Shared that she has used cocaine in the pass. Pt. Is working on self control and using drugs.   Bing Plume D 09/07/2012, 8:45 PM

## 2012-09-07 NOTE — [facility] Group Notes (Signed)
[facility] LCSW Aftercare Discharge Planning Group Note   09/07/2012 8:11 AM  Participation Quality:  Minimal  Mood/Affect:  Depressed and Flat  Depression Rating:  7  Anxiety Rating:  7  Thoughts of Suicide:  No Will you contract for safety?   NA  Current AVH:  No  Plan for Discharge/Comments:  Diane Rosario states she is doing much better than yesterday, but she still presents as flat and depressed, and she acknowledges depression as well.  Seemed surprised that she would not be released today.  Transportation Means:   Supports:  Diane Rosario B

## 2012-09-07 NOTE — Progress Notes (Signed)
[facility] MD Progress Note  09/07/2012 3:33 PM Diane Rosario  MRN:  [id number] Subjective:  "I'm feeling fine, but sleepy." Objective: Patient is awake and up on the unit. Makes good eye contact, speech is clear an goal directed. Denies new complaints, reports no side effects from medication, which she reports she is taking, she is attending groups. Diagnosis:  MDD severe recurrent with psychotic features, cocaine abuse, cannabis abuse, PTSD  ADL's:  Impaired  Sleep: Good  Appetite:  Good  Suicidal Ideation:  "decreasing" Homicidal Ideation:  denies AEB (as evidenced by):  Psychiatric Specialty Exam: Review of Systems  Constitutional: Negative.  Negative for fever, chills, weight loss, malaise/fatigue and diaphoresis.  HENT: Negative for congestion and sore throat.   Eyes: Negative for blurred vision, double vision and photophobia.  Respiratory: Negative for cough, shortness of breath and wheezing.   Cardiovascular: Negative for chest pain, palpitations and PND.  Gastrointestinal: Negative for heartburn, nausea, vomiting, abdominal pain, diarrhea and constipation.  Musculoskeletal: Negative for myalgias, joint pain and falls.  Neurological: Negative for dizziness, tingling, tremors, sensory change, speech change, focal weakness, seizures, loss of consciousness, weakness and headaches.  Endo/Heme/Allergies: Negative for polydipsia. Does not bruise/bleed easily.  Psychiatric/Behavioral: Negative for depression, suicidal ideas, hallucinations, memory loss and substance abuse. The patient is not nervous/anxious and does not have insomnia.     Blood pressure 102/72, pulse 106, temperature 98.3 F (36.8 C), temperature source Oral, resp. rate 16, height 5' 5.5" (1.664 m), weight 39.009 kg (86 lb), last menstrual period 08/06/2012.Body mass index is 14.09 kg/(m^2).  General Appearance: Disheveled  Eye Contact::  Good  Speech:  Clear and Coherent  Volume:  Decreased  Mood:  Anxious and  Depressed  Affect:  Congruent  Thought Process:  Goal Directed  Orientation:  Full (Time, Place, and Person)  Thought Content:  Hallucinations: Auditory  Suicidal Thoughts:  No  Homicidal Thoughts:  No  Memory:  Immediate;   Fair  Judgement:  Intact  Insight:  Present  Psychomotor Activity:  Decreased  Concentration:  Fair  Recall:  Fair  Akathisia:  No  Handed:  Right  AIMS (if indicated):     Assets:  Communication Skills Desire for Improvement  Sleep:  Number of Hours: 6.75   Current Medications: Current Facility-Administered Medications  Medication Dose Route Frequency Provider Last Rate Last Dose  . acetaminophen (TYLENOL) tablet 650 mg  650 mg Oral Q6H PRN Kerry Hough, PA-C      . alum & mag hydroxide-simeth (MAALOX/MYLANTA) 200-200-20 MG/5ML suspension 30 mL  30 mL Oral Q4H PRN Kerry Hough, PA-C      . benztropine (COGENTIN) tablet 1 mg  1 mg Oral QHS Mojeed Akintayo   1 mg at 09/06/12 2139  . feeding supplement (ENSURE COMPLETE) liquid 237 mL  237 mL Oral TID BM Mojeed Akintayo   237 mL at 09/06/12 2154  . FLUoxetine (PROZAC) capsule 10 mg  10 mg Oral Daily Mojeed Akintayo   10 mg at 09/07/12 0752  . haloperidol (HALDOL) tablet 5 mg  5 mg Oral QHS Mojeed Akintayo   5 mg at 09/06/12 2139  . magnesium hydroxide (MILK OF MAGNESIA) suspension 30 mL  30 mL Oral Daily PRN Kerry Hough, PA-C      . multivitamin with minerals tablet 1 tablet  1 tablet Oral QHS Kerry Hough, PA-C   1 tablet at 09/06/12 2139  . nicotine (NICODERM CQ - dosed in mg/24 hours) patch 14 mg  14  mg Transdermal Q0600 Kerry Hough, PA-C      . traZODone (DESYREL) tablet 50 mg  50 mg Oral QHS,MR X 1 Kerry Hough, PA-C   50 mg at 09/06/12 2140    Lab Results:  Results for orders placed during the hospital encounter of 09/05/12 (from the past 48 hour(s))  TSH     Status: None   Collection Time    09/06/12  6:20 AM      Result Value Range   TSH 0.725  0.350 - 4.500 uIU/mL  PREALBUMIN      Status: None   Collection Time    09/06/12  6:20 AM      Result Value Range   Prealbumin 27.1  17.0 - 34.0 mg/dL  LIPID PANEL     Status: None   Collection Time    09/06/12  6:20 AM      Result Value Range   Cholesterol 192  0 - 200 mg/dL   Triglycerides 41  <478 mg/dL   HDL 295  >62 mg/dL   Total CHOL/HDL Ratio 1.7     VLDL 8  0 - 40 mg/dL   LDL Cholesterol 71  0 - 99 mg/dL   Comment:            Total Cholesterol/HDL:CHD Risk     Coronary Heart Disease Risk Table                         Men   Women      1/2 Average Risk   3.4   3.3      Average Risk       5.0   4.4      2 X Average Risk   9.6   7.1      3 X Average Risk  23.4   11.0                Use the calculated Patient Ratio     above and the CHD Risk Table     to determine the patient's CHD Risk.                ATP III CLASSIFICATION (LDL):      <100     mg/dL   Optimal      130-865  mg/dL   Near or Above                        Optimal      130-159  mg/dL   Borderline      784-696  mg/dL   High      >295     mg/dL   Very High    Physical Findings: AIMS: Facial and Oral Movements Muscles of Facial Expression: None, normal Lips and Perioral Area: None, normal Jaw: None, normal Tongue: None, normal,Extremity Movements Upper (arms, wrists, hands, fingers): None, normal Lower (legs, knees, ankles, toes): None, normal, Trunk Movements Neck, shoulders, hips: None, normal, Overall Severity Severity of abnormal movements (highest score from questions above): None, normal Incapacitation due to abnormal movements: None, normal Patient's awareness of abnormal movements (rate only patient's report): No Awareness, Dental Status Current problems with teeth and/or dentures?: No Does patient usually wear dentures?: No  CIWA:  CIWA-Ar Total: 1 COWS:  COWS Total Score: 2  Treatment Plan Summary: Daily contact with patient to assess and evaluate symptoms and progress in treatment Medication management  Plan: 1. Continue  crisis management and stabilization. 2. Medication management to reduce current symptoms to base line and improve patient's overall level of functioning 3. Treat health problems as indicated. 4. Develop treatment plan to decrease risk of relapse upon discharge and the need for readmission. 5. Psycho-social education regarding relapse prevention and self care. 6. Health care follow up as needed for medical problems. 7. Continue home medications where appropriate. 8. ELOS: 5-7 days   Medical Decision Making Problem Points:  Established problem, stable/improving (1) Data Points:  Review or order medicine tests (1)  I certify that inpatient services furnished can reasonably be expected to improve the patient's condition.  Rona Ravens. Zach Tietje RPAC 3:37 PM 09/07/2012

## 2012-09-07 NOTE — Progress Notes (Signed)
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  INTERVENTION: 1. Educated patient on the importance of nutrition and encouraged intake of food and beverages. 2. Discussed weight goals. 3. Supplements: Ensure Complete tid and MVI daily  NUTRITION DIAGNOSIS: Unintentional weight loss related to sub-optimal intake as evidenced by pt report.   Goal: Pt to meet >/= 90% of their estimated nutrition needs.  Monitor:  PO intake  Assessment:  Consult received secondary to patient with poor po intake.  Admitted with BP disorder, PTSD and auditory hallucinations.    Patient states that she is eating well and drinking her Ensure.  States that she thinks her weight is fine and that she was eating very well prior to admit.  Does not know why she has been losing weight.  UBW is 100# which is still underweight for patient.  Patient does not know the last time she weighed 100 lbs.  Staff reports patient with poor intake. Patient had poor eye contact throughout consultation. Patient reports a good intake.    37 y.o. female  Height: Ht Readings from Last 1 Encounters:  09/05/12 5' 5.5" (1.664 m)    Weight: Wt Readings from Last 1 Encounters:  09/05/12 86 lb (39.009 kg)    Weight Hx: Wt Readings from Last 10 Encounters:  09/05/12 86 lb (39.009 kg)    BMI:  Body mass index is 14.09 kg/(m^2). Pt meets criteria for underweight based on current BMI.  Estimated Nutritional Needs: Kcal: 25-30 kcal/kg Protein: > 1 gram protein/kg Fluid: 1 ml/kcal  Diet Order: General Pt is also offered choice of unit snacks mid-morning and mid-afternoon.  Pt is eating as desired.   Lab results and medications reviewed.   Oran Rein, RD, LDN Clinical Inpatient Dietitian Pager:  [phone] Weekend and after hours pager:  [phone]

## 2012-09-07 NOTE — Progress Notes (Signed)
Recreation Therapy Notes  Date: 05.30.2014 Time: 9:30am Location: [address] Dayroom      Group Topic/Focus: Communication  Participation Level: Did not attend  [employer], LRT/CTRS  Jearl Klinefelter 09/07/2012 2:46 PM

## 2012-09-08 DIAGNOSIS — F4310 Post-traumatic stress disorder, unspecified: Secondary | ICD-10-CM

## 2012-09-08 DIAGNOSIS — F333 Major depressive disorder, recurrent, severe with psychotic symptoms: Principal | ICD-10-CM

## 2012-09-08 DIAGNOSIS — F1410 Cocaine abuse, uncomplicated: Secondary | ICD-10-CM

## 2012-09-08 DIAGNOSIS — F1210 Cannabis abuse, uncomplicated: Secondary | ICD-10-CM

## 2012-09-08 LAB — URINE CULTURE: Colony Count: 45000

## 2012-09-08 NOTE — Progress Notes (Signed)
Patient ID: Diane Rosario, female   DOB: Aug 10, 1975, 37 y.o.   MRN: [id number]  D: Pt denies SI/HI/AVH. Pt is pleasant and cooperative. Pt states " the medicine must be working, i'm not hearing the voices, when I take my medicine I don't hear the voices". Pt in better spirits, pt has overall brighter affect. Pt had visit from her significant other, and was in good mood ever since.   A: Pt was offered support and encouragement. Pt was given scheduled medications. Pt was encourage to attend groups. Q 15 minute checks were done for safety.   R:Pt attends groups and interacts well with peers and staff. Pt is taking medication. Pt has no complaints at this time.Pt receptive to treatment and safety maintained on unit.

## 2012-09-08 NOTE — Progress Notes (Signed)
Patient ID: Diane Rosario, female   DOB: March 28, 1976, 37 y.o.   MRN: [id number] Psychoeducational Group Note  Date:  09/08/2012 Time:1000am  Group Topic/Focus:  Identifying Needs:   The focus of this group is to help patients identify their personal needs that have been historically problematic and identify healthy behaviors to address their needs.  Participation Level:  Active  Participation Quality:  Appropriate  Affect:  Anxious  Cognitive:  Appropriate  Insight:  Supportive  Engagement in Group:  Supportive  Additional Comments:  Inventory and Psychoeducational group   Valente David 09/08/2012,10:13 AM

## 2012-09-08 NOTE — Progress Notes (Signed)
[facility] MD Progress Note  09/08/2012 12:37 PM Diane Rosario  MRN:  [id number] Subjective:  "I'm taking my medication." Objective: Patient seen and chart reviewed.  She's compliant with the medication and denies any side effects.  She participates in the group however she has very little involvement.  She remains very withdrawn isolated and guarded.  She continued to endorse passive suicidal thinking but denies any auditory hallucination.  She is feeling medicine is helping her voices and suicidal thinking.  She has no tremors or shakes.  She is not a management problem.   Diagnosis:  MDD severe recurrent with psychotic features, cocaine abuse, cannabis abuse, PTSD  ADL's:  Impaired  Sleep: Good  Appetite:  Good  Suicidal Ideation:  "decreasing" Homicidal Ideation:  denies AEB (as evidenced by):  Psychiatric Specialty Exam: Review of Systems  Constitutional: Negative.  Negative for fever, chills, weight loss, malaise/fatigue and diaphoresis.  HENT: Negative for congestion and sore throat.   Eyes: Negative for blurred vision, double vision and photophobia.  Respiratory: Negative for cough, shortness of breath and wheezing.   Cardiovascular: Negative for chest pain, palpitations and PND.  Gastrointestinal: Negative for heartburn, nausea, vomiting, abdominal pain, diarrhea and constipation.  Musculoskeletal: Negative for myalgias, joint pain and falls.  Neurological: Negative for dizziness, tingling, tremors, sensory change, speech change, focal weakness, seizures, loss of consciousness, weakness and headaches.  Endo/Heme/Allergies: Negative for polydipsia. Does not bruise/bleed easily.  Psychiatric/Behavioral: Positive for suicidal ideas and hallucinations. Negative for depression, memory loss and substance abuse. The patient is nervous/anxious and has insomnia.     Blood pressure 102/71, pulse 89, temperature 97.7 F (36.5 C), temperature source Oral, resp. rate 22, height 5' 5.5" (1.664  m), weight 39.009 kg (86 lb), last menstrual period 08/06/2012.Body mass index is 14.09 kg/(m^2).  General Appearance: Disheveled  Eye Contact::  Good  Speech:  Clear and Coherent  Volume:  Decreased  Mood:  Anxious and Depressed  Affect:  Congruent  Thought Process:  Goal Directed  Orientation:  Full (Time, Place, and Person)  Thought Content:  Hallucinations: Auditory  Suicidal Thoughts:  Yes.  without intent/plan  Homicidal Thoughts:  No  Memory:  Immediate;   Fair  Judgement:  Intact  Insight:  Present  Psychomotor Activity:  Decreased  Concentration:  Fair  Recall:  Fair  Akathisia:  No  Handed:  Right  AIMS (if indicated):     Assets:  Communication Skills Desire for Improvement  Sleep:  Number of Hours: 6.5   Current Medications: Current Facility-Administered Medications  Medication Dose Route Frequency Provider Last Rate Last Dose  . acetaminophen (TYLENOL) tablet 650 mg  650 mg Oral Q6H PRN Kerry Hough, PA-C      . alum & mag hydroxide-simeth (MAALOX/MYLANTA) 200-200-20 MG/5ML suspension 30 mL  30 mL Oral Q4H PRN Kerry Hough, PA-C      . benztropine (COGENTIN) tablet 1 mg  1 mg Oral QHS Mojeed Akintayo   1 mg at 09/07/12 2152  . feeding supplement (ENSURE COMPLETE) liquid 237 mL  237 mL Oral TID BM Mojeed Akintayo   237 mL at 09/08/12 0817  . FLUoxetine (PROZAC) capsule 10 mg  10 mg Oral Daily Mojeed Akintayo   10 mg at 09/08/12 0817  . haloperidol (HALDOL) tablet 5 mg  5 mg Oral QHS Mojeed Akintayo   5 mg at 09/07/12 2150  . magnesium hydroxide (MILK OF MAGNESIA) suspension 30 mL  30 mL Oral Daily PRN Kerry Hough, PA-C      .  multivitamin with minerals tablet 1 tablet  1 tablet Oral QHS Kerry Hough, PA-C   1 tablet at 09/07/12 2152  . nicotine (NICODERM CQ - dosed in mg/24 hours) patch 14 mg  14 mg Transdermal Q0600 Kerry Hough, PA-C   14 mg at 09/08/12 0817  . traZODone (DESYREL) tablet 50 mg  50 mg Oral QHS,MR X 1 Kerry Hough, PA-C   50 mg at  09/07/12 2151    Lab Results:  No results found for this or any previous visit (from the past 48 hour(s)).  Physical Findings: AIMS: Facial and Oral Movements Muscles of Facial Expression: None, normal Lips and Perioral Area: None, normal Jaw: None, normal Tongue: None, normal,Extremity Movements Upper (arms, wrists, hands, fingers): None, normal Lower (legs, knees, ankles, toes): None, normal, Trunk Movements Neck, shoulders, hips: None, normal, Overall Severity Severity of abnormal movements (highest score from questions above): None, normal Incapacitation due to abnormal movements: None, normal Patient's awareness of abnormal movements (rate only patient's report): No Awareness, Dental Status Current problems with teeth and/or dentures?: No Does patient usually wear dentures?: No  CIWA:  CIWA-Ar Total: 1 COWS:  COWS Total Score: 2  Treatment Plan Summary: Daily contact with patient to assess and evaluate symptoms and progress in treatment Medication management  Plan: 1. Continue crisis management and stabilization. 2. Medication management to reduce current symptoms to base line and improve patient's overall level of functioning 3. Treat health problems as indicated. 4. Develop treatment plan to decrease risk of relapse upon discharge and the need for readmission. 5. Psycho-social education regarding relapse prevention and self care. 6. Health care follow up as needed for medical problems. 7. Continue home medications where appropriate. 8. ELOS: 5-7 days   Medical Decision Making Problem Points:  Established problem, stable/improving (1) Data Points:  Review or order medicine tests (1)  I certify that inpatient services furnished can reasonably be expected to improve the patient's condition.  Kathryne Sharper, MD  12:37 PM 09/08/2012

## 2012-09-08 NOTE — [facility] Group Notes (Signed)
[facility] Group Notes: (Clinical Social Work)   09/08/2012      Type of Therapy:  Group Therapy   Participation Level:  Did Not Attend    Ambrose Mantle, LCSW 09/08/2012, 1:10 PM

## 2012-09-08 NOTE — Progress Notes (Signed)
Patient ID: Gavin Pound, female   DOB: [birth year], 37 y.o.   MRN: [id number]  D: Pt denies SI/HI/AVH. Pt is pleasant and cooperative. " If I stay on my medication, I don't hear the voices"   A: Pt was offered support and encouragement. Pt was given scheduled medications. Pt was encourage to attend groups. Q 15 minute checks were done for safety.   R:Pt attends groups and interacts well with peers and staff. Pt is taking medication. Pt has no complaints at this time.Pt receptive to treatment and safety maintained on unit.

## 2012-09-09 NOTE — Progress Notes (Signed)
[facility] MD Progress Note  09/09/2012 10:33 AM Diane Rosario  MRN:  [id number] Subjective:  "I'm feeling better" Objective: Patient seen and chart reviewed.  Today she appears sedated however she does not report any side effects of medication.  She's compliant with the medication .  She participated in some groups.  She remains withdrawn isolative and guarded.  She continued to endorse auditory hallucinations but they're less intense and less frequent from the past.  She still has passive suicidal thinking but no plan.  She was to continue her current psychiatric medication.  She still does not feel safe to be discharged. Diagnosis:  MDD severe recurrent with psychotic features, cocaine abuse, cannabis abuse, PTSD  ADL's:  Impaired  Sleep: Good  Appetite:  Good  Suicidal Ideation:  "decreasing" Homicidal Ideation:  denies AEB (as evidenced by):  Psychiatric Specialty Exam: Review of Systems  Constitutional: Negative.  Negative for fever, chills, weight loss, malaise/fatigue and diaphoresis.  HENT: Negative for congestion and sore throat.   Eyes: Negative for blurred vision, double vision and photophobia.  Respiratory: Negative for cough, shortness of breath and wheezing.   Cardiovascular: Negative for chest pain, palpitations and PND.  Gastrointestinal: Negative for heartburn, nausea, vomiting, abdominal pain, diarrhea and constipation.  Musculoskeletal: Negative for myalgias, joint pain and falls.  Neurological: Negative for dizziness, tingling, tremors, sensory change, speech change, focal weakness, seizures, loss of consciousness, weakness and headaches.  Endo/Heme/Allergies: Negative for polydipsia. Does not bruise/bleed easily.  Psychiatric/Behavioral: Positive for suicidal ideas and hallucinations. Negative for depression, memory loss and substance abuse. The patient is nervous/anxious and has insomnia.     Blood pressure 99/65, pulse 96, temperature 98 F (36.7 C), temperature  source Oral, resp. rate 17, height 5' 5.5" (1.664 m), weight 39.009 kg (86 lb), last menstrual period 08/06/2012.Body mass index is 14.09 kg/(m^2).  General Appearance: Disheveled  Eye Contact::  Good  Speech:  Clear and Coherent  Volume:  Decreased  Mood:  Anxious and Depressed  Affect:  Congruent  Thought Process:  Goal Directed  Orientation:  Full (Time, Place, and Person)  Thought Content:  Hallucinations: Auditory  Suicidal Thoughts:  Yes.  without intent/plan  Homicidal Thoughts:  No  Memory:  Immediate;   Fair  Judgement:  Intact  Insight:  Present  Psychomotor Activity:  Decreased  Concentration:  Fair  Recall:  Fair  Akathisia:  No  Handed:  Right  AIMS (if indicated):     Assets:  Communication Skills Desire for Improvement  Sleep:  Number of Hours: 5.5   Current Medications: Current Facility-Administered Medications  Medication Dose Route Frequency Provider Last Rate Last Dose  . acetaminophen (TYLENOL) tablet 650 mg  650 mg Oral Q6H PRN Kerry Hough, PA-C      . alum & mag hydroxide-simeth (MAALOX/MYLANTA) 200-200-20 MG/5ML suspension 30 mL  30 mL Oral Q4H PRN Kerry Hough, PA-C      . benztropine (COGENTIN) tablet 1 mg  1 mg Oral QHS Mojeed Akintayo   1 mg at 09/08/12 2108  . feeding supplement (ENSURE COMPLETE) liquid 237 mL  237 mL Oral TID BM Mojeed Akintayo   237 mL at 09/09/12 0754  . FLUoxetine (PROZAC) capsule 10 mg  10 mg Oral Daily Mojeed Akintayo   10 mg at 09/09/12 0754  . haloperidol (HALDOL) tablet 5 mg  5 mg Oral QHS Mojeed Akintayo   5 mg at 09/08/12 2108  . magnesium hydroxide (MILK OF MAGNESIA) suspension 30 mL  30 mL  Oral Daily PRN Kerry Hough, PA-C      . multivitamin with minerals tablet 1 tablet  1 tablet Oral QHS Kerry Hough, PA-C   1 tablet at 09/08/12 2108  . nicotine (NICODERM CQ - dosed in mg/24 hours) patch 14 mg  14 mg Transdermal Q0600 Kerry Hough, PA-C   14 mg at 09/09/12 0615  . traZODone (DESYREL) tablet 50 mg  50 mg  Oral QHS,MR X 1 Spencer E Simon, PA-C   50 mg at 09/08/12 2340    Lab Results:  No results found for this or any previous visit (from the past 48 hour(s)).  Physical Findings: AIMS: Facial and Oral Movements Muscles of Facial Expression: None, normal Lips and Perioral Area: None, normal Jaw: None, normal Tongue: None, normal,Extremity Movements Upper (arms, wrists, hands, fingers): None, normal Lower (legs, knees, ankles, toes): None, normal, Trunk Movements Neck, shoulders, hips: None, normal, Overall Severity Severity of abnormal movements (highest score from questions above): None, normal Incapacitation due to abnormal movements: None, normal Patient's awareness of abnormal movements (rate only patient's report): No Awareness, Dental Status Current problems with teeth and/or dentures?: No Does patient usually wear dentures?: No  CIWA:  CIWA-Ar Total: 1 COWS:  COWS Total Score: 2  Treatment Plan Summary: Daily contact with patient to assess and evaluate symptoms and progress in treatment Medication management  Plan: 1. Continue crisis management and stabilization. 2. Medication management to reduce current symptoms to base line and improve patient's overall level of functioning 3. Treat health problems as indicated. 4. Develop treatment plan to decrease risk of relapse upon discharge and the need for readmission. 5. Psycho-social education regarding relapse prevention and self care. 6. Health care follow up as needed for medical problems. 7. Continue home medications where appropriate. 8. ELOS: 5-7 days   Medical Decision Making Problem Points:  Established problem, stable/improving (1) Data Points:  Review or order medicine tests (1)  I certify that inpatient services furnished can reasonably be expected to improve the patient's condition.  Kathryne Sharper, MD  10:33 AM 09/09/2012

## 2012-09-09 NOTE — [facility] Group Notes (Signed)
[facility] Group Notes: (Clinical Social Work)   09/09/2012      Type of Therapy:  Group Therapy   Participation Level:  Did Not Attend    Jaimeson Gopal Grossman-Orr, LCSW 09/09/2012, 1:11 PM     

## 2012-09-09 NOTE — Progress Notes (Signed)
D.  Pt.  Reports feeling better today.  Denies SI and states that are voices are like whispers today.   A.  Q 15 min safety checks maintained. R.  Pt. Remains safe.

## 2012-09-09 NOTE — Progress Notes (Signed)
Patient ID: Diane Rosario, female   DOB: [birth year], 37 y.o.   MRN: [id number] Psychoeducational Group Note  Date:  09/09/2012 Time:  1000am  Group Topic/Focus:  Making Healthy Choices:   The focus of this group is to help patients identify negative/unhealthy choices they were using prior to admission and identify positive/healthier coping strategies to replace them upon discharge.  Participation Level:  Did Not Attend  Participation Quality:    Affect:   Cognitive: Insight: Engagement in Group:  Additional Comments:  Spiritual group   Valente David 09/09/2012,11:24 AM

## 2012-09-09 NOTE — Progress Notes (Signed)
Patient ID: Diane Rosario, female   DOB: [birth year], 37 y.o.   MRN: [id number] Psychoeducational Group Note  Date:  09/09/2012 Time:  1000am  Group Topic/Focus:  Making Healthy Choices:   The focus of this group is to help patients identify negative/unhealthy choices they were using prior to admission and identify positive/healthier coping strategies to replace them upon discharge.  Participation Level:  Did Not Attend  Participation Quality:    Affect:  Cognitive:   Insight: Engagement in Group Additional Comments:  Inventory and psychoeducational group   Valente David 09/09/2012,10:06 AM

## 2012-09-10 MED ORDER — DIPHENHYDRAMINE HCL 50 MG/ML IJ SOLN: 25.0000 mg | Freq: Once | INTRAMUSCULAR | Status: DC

## 2012-09-10 MED ORDER — DIPHENHYDRAMINE HCL 50 MG/ML IJ SOLN
25.0000 mg | Freq: Once | INTRAMUSCULAR | Status: AC
  Administered 2012-09-10: 25 mg via INTRAMUSCULAR
  Filled 2012-09-10: qty 0.5
  Filled 2012-09-10: qty 1

## 2012-09-10 MED ORDER — HALOPERIDOL DECANOATE 100 MG/ML IM SOLN
50.0000 mg | INTRAMUSCULAR | Status: DC
  Administered 2012-09-10: 50 mg via INTRAMUSCULAR
  Filled 2012-09-10: qty 0.5

## 2012-09-10 NOTE — Progress Notes (Signed)
Patient ID: Diane Rosario, female   DOB: [birth year], 37 y.o.   MRN: [id number] D:Patient lying in bed with eyes closed. Respirations even and non-labored. A: Staff will monitor on q 15 minute checks, follow treatment plan, and give meds as ordered. R: Appears asleep.

## 2012-09-10 NOTE — Progress Notes (Signed)
D: Patient in the dayroom on approach.  Patient states her day was ok.  Patient states she ate well today.  Patient states she needs to stop worrying about things.  Patient states she akso needs to make sure she continues to take her medications.  Patient denies SI/HI and denies AVH.  A: Staff to monitor Q 15 mins for safety.  Encouragement and support offered.  Scheduled medications administered per orders. R: Patient remains safe on the unit.  Patient attended group tonight.  Patient cooperative and taking administered medications.  Patient visible on the unit and interacting with peers.

## 2012-09-10 NOTE — Progress Notes (Signed)
D:  Patient has been up and visible in the milieu today.  Remains quiet and somewhat withdrawn.  Denies depression or hopelessness  Also denies suicidal thoughts on her self inventory form, but admits to having some thoughts when she spoke with the doctor.  She was started today on Haldol decanoate.  She did not go to lunch today and declined Ensure, but she did go to dinner and ate a good amount.  A:  Medications given as prescribed.  Patient was given Haldol dec with 25 mg of IM Benadryl.  She then slept for most of the afternoon.   R:  Patient states she wants to go home tomorrow.  She remains quiet and somewhat withdrawn, but she is pleasant.  She is interacting a bit more on the unit.

## 2012-09-10 NOTE — Progress Notes (Signed)
Recreation Therapy Notes  Date: 06.02.2014 Time: 9:30am Location: [address] Dayroom      Group Topic/Focus: Goal Setting  Participation Level: Did not attend  [employer], LRT/CTRS  Bobbi Yount L 09/10/2012 10:14 AM

## 2012-09-10 NOTE — Progress Notes (Signed)
Patient ID: Diane Rosario, female   DOB: 12-21-1975, 37 y.o.   MRN: [id number] [facility] MD Progress Note  09/10/2012 10:41 AM Diane Rosario  MRN:  [id number] Subjective:  "I am feeling much better, when can I go home?. Objective: Patient seen and chart reviewed. She reports feeling less depressed, anxious or delusional. However, she remains socially withdrawn with minimal interaction with her peers. She continued to endorse auditory hallucinations but they're less intense and less frequent than in  the past.  She still has passive suicidal thinking but with no specific plan.  She was to continue her current psychiatric medication. But in addition, her ACT Team has requested that she should be started on Haldol decanoate injection due to her tendency for non-compliance with oral medications.  Diagnosis:  MDD severe recurrent with psychotic features.                     Cocaine abuse. Cannabis abuse.  PTSD  ADL's:  Intact  Sleep: Good  Appetite:  Good  Suicidal Ideation:  "decreasing" Homicidal Ideation:  denies AEB (as evidenced by):  Psychiatric Specialty Exam: Review of Systems  Constitutional: Negative.  Negative for fever, chills, weight loss, malaise/fatigue and diaphoresis.  HENT: Negative for congestion and sore throat.   Eyes: Negative for blurred vision, double vision and photophobia.  Respiratory: Negative for cough, shortness of breath and wheezing.   Cardiovascular: Negative for chest pain, palpitations and PND.  Gastrointestinal: Negative for heartburn, nausea, vomiting, abdominal pain, diarrhea and constipation.  Musculoskeletal: Negative for myalgias, joint pain and falls.  Neurological: Negative for dizziness, tingling, tremors, sensory change, speech change, focal weakness, seizures, loss of consciousness, weakness and headaches.  Endo/Heme/Allergies: Negative for polydipsia. Does not bruise/bleed easily.  Psychiatric/Behavioral: Positive for hallucinations. Negative for  depression, memory loss and substance abuse. The patient is nervous/anxious.     Blood pressure 86/58, pulse 84, temperature 97 F (36.1 C), temperature source Oral, resp. rate 16, height 5' 5.5" (1.664 m), weight 39.009 kg (86 lb), last menstrual period 08/06/2012.Body mass index is 14.09 kg/(m^2).  General Appearance: fairly groomed  Patent attorney::  Good  Speech:  Clear and Coherent  Volume:  Decreased  Mood:  Anxious and Depressed  Affect:  Congruent  Thought Process:  Goal Directed  Orientation:  Full (Time, Place, and Person)  Thought Content:  Hallucinations: Auditory  Suicidal Thoughts:  intermittent  Homicidal Thoughts:  No  Memory:  Immediate;   Fair  Judgement:  Intact  Insight:  Present  Psychomotor Activity:  Decreased  Concentration:  Fair  Recall:  Fair  Akathisia:  No  Handed:  Right  AIMS (if indicated):     Assets:  Communication Skills Desire for Improvement  Sleep:  Number of Hours: 6.75   Current Medications: Current Facility-Administered Medications  Medication Dose Route Frequency Provider Last Rate Last Dose  . acetaminophen (TYLENOL) tablet 650 mg  650 mg Oral Q6H PRN Kerry Hough, PA-C      . alum & mag hydroxide-simeth (MAALOX/MYLANTA) 200-200-20 MG/5ML suspension 30 mL  30 mL Oral Q4H PRN Kerry Hough, PA-C      . benztropine (COGENTIN) tablet 1 mg  1 mg Oral QHS Javarus Dorner   1 mg at 09/09/12 2127  . diphenhydrAMINE (BENADRYL) injection 25 mg  25 mg Intravenous Once Jordane Hisle      . feeding supplement (ENSURE COMPLETE) liquid 237 mL  237 mL Oral TID BM Rebbie Lauricella   237 mL  at 09/09/12 2129  . FLUoxetine (PROZAC) capsule 10 mg  10 mg Oral Daily Aubriauna Riner   10 mg at 09/10/12 0814  . haloperidol (HALDOL) tablet 5 mg  5 mg Oral QHS Svea Pusch   5 mg at 09/09/12 2127  . haloperidol decanoate (HALDOL DECANOATE) 100 MG/ML injection 50 mg  50 mg Intramuscular Q30 days Donyell Carrell      . magnesium hydroxide (MILK OF MAGNESIA)  suspension 30 mL  30 mL Oral Daily PRN Kerry Hough, PA-C      . multivitamin with minerals tablet 1 tablet  1 tablet Oral QHS Kerry Hough, PA-C   1 tablet at 09/09/12 2128  . nicotine (NICODERM CQ - dosed in mg/24 hours) patch 14 mg  14 mg Transdermal Q0600 Kerry Hough, PA-C   14 mg at 09/09/12 0615  . traZODone (DESYREL) tablet 50 mg  50 mg Oral QHS,MR X 1 Spencer E Simon, PA-C   50 mg at 09/09/12 2245    Lab Results:  No results found for this or any previous visit (from the past 48 hour(s)).  Physical Findings: AIMS: Facial and Oral Movements Muscles of Facial Expression: None, normal Lips and Perioral Area: None, normal Jaw: None, normal Tongue: None, normal,Extremity Movements Upper (arms, wrists, hands, fingers): None, normal Lower (legs, knees, ankles, toes): None, normal, Trunk Movements Neck, shoulders, hips: None, normal, Overall Severity Severity of abnormal movements (highest score from questions above): None, normal Incapacitation due to abnormal movements: None, normal Patient's awareness of abnormal movements (rate only patient's report): No Awareness, Dental Status Current problems with teeth and/or dentures?: No Does patient usually wear dentures?: No  CIWA:  CIWA-Ar Total: 1 COWS:  COWS Total Score: 2  Treatment Plan Summary: Daily contact with patient to assess and evaluate symptoms and progress in treatment Medication management  Plan: 1. Continue crisis management and stabilization. 2. Medication management to reduce current symptoms to base line and improve patient's overall level of functioning 3. Treat health problems as indicated. 4. Develop treatment plan to decrease risk of relapse upon discharge and the need for readmission. 5. Psycho-social education regarding relapse prevention and self care. 6. Health care follow up as needed for medical problems. 7. Continue home medications where appropriate. 8. Re-initiate Haldol decanoate 50mg  IM  q30 day . 9. Benadryl 25mg  IM X 1 dose to prevent EPS 10. ELOS 2-3 days   Medical Decision Making Problem Points:  Established problem, stable/improving (1) Data Points:  Review or order medicine tests (1)  I certify that inpatient services furnished can reasonably be expected to improve the patient's condition.  Thedore Mins, MD  10:41 AM 09/10/2012

## 2012-09-11 MED ORDER — HALOPERIDOL DECANOATE 100 MG/ML IM SOLN: 50.0000 mg | INTRAMUSCULAR | Status: DC

## 2012-09-11 MED ORDER — TRAZODONE HCL 50 MG PO TABS: 50.0000 mg | ORAL_TABLET | Freq: Every evening | ORAL | Status: DC | PRN

## 2012-09-11 MED ORDER — FLUOXETINE HCL 10 MG PO TABS: 10.0000 mg | ORAL_TABLET | Freq: Every day | ORAL | Status: DC

## 2012-09-11 MED ORDER — HALOPERIDOL 5 MG PO TABS: 5.0000 mg | ORAL_TABLET | Freq: Every day | ORAL | Status: DC

## 2012-09-11 MED ORDER — BENZTROPINE MESYLATE 1 MG PO TABS: 1.0000 mg | ORAL_TABLET | Freq: Every day | ORAL | Status: DC

## 2012-09-11 NOTE — Progress Notes (Signed)
Adult Psychoeducational Group Note  Date:  09/11/2012 Time:  10:37 AM  Group Topic/Focus:  Recovery Goals:   The focus of this group is to identify appropriate goals for recovery and establish a plan to achieve them.  Participation Level:  Active  Participation Quality:  Appropriate and Attentive  Affect:  Appropriate  Cognitive:  Appropriate  Insight: Appropriate  Engagement in Group:  Engaged  Modes of Intervention:  Discussion, Education and Support  Additional Comments:  Diane Rosario was active in group. She stated that she needs to stay on her meds in order to experience a good recovery. She says she needs to keep taking her meds even when she is feeling good.   Diane Rosario 09/11/2012, 10:37 AM

## 2012-09-11 NOTE — Progress Notes (Signed)
D:  Patient discharged to home today.  All belongings retrieved from room and from locker in the search room.  Patient is smiling and is interacting more with peers and staff today.  She denies depressive symptoms or suicidal thoughts.  A:  Reviewed all discharge instructions, medications, and follow up care.  Patient given two weeks supply of medication from the hospital pharmacy.   R:  Verbalized understanding of all instructions.  States she will be following with the ACT team.  Shared that her fiance does not use substances and he will be helping to support and encourage her.  Patient was escorted to the search room and to the front lobby where her ride was waiting for her.

## 2012-09-11 NOTE — Tx Team (Signed)
  Interdisciplinary Treatment Plan Update   Date Reviewed:  09/11/2012  Time Reviewed:  10:11 AM  Progress in Treatment:   Attending groups: Yes Participating in groups: Yes Taking medication as prescribed: Yes  Tolerating medication: Yes Family/Significant other contact made: Yes  Patient understands diagnosis: Yes  Discussing patient identified problems/goals with staff: Yes Medical problems stabilized or resolved: Yes Denies suicidal/homicidal ideation: Yes Patient has not harmed self or others: Yes  For review of initial/current patient goals, please see plan of care.  Estimated Length of Stay:  D/C today  Reason for Continuation of Hospitalization:   New Problems/Goals identified:  N/A  Discharge Plan or Barriers:   Return home, follow up outpt  Additional Comments:    Attendees:  Signature: Thedore Mins, MD 09/11/2012 10:11 AM   Signature: Richelle Ito, LCSW 09/11/2012 10:11 AM  Signature: Verne Spurr, PA 09/11/2012 10:11 AM  Signature:  09/11/2012 10:11 AM  Signature: Liborio Nixon, RN 09/11/2012 10:11 AM  Signature:  09/11/2012 10:11 AM  Signature:   09/11/2012 10:11 AM  Signature:    Signature:    Signature:    Signature:    Signature:    Signature:      Scribe for Treatment Team:   Richelle Ito, LCSW  09/11/2012 10:11 AM

## 2012-09-11 NOTE — [facility] Suicide Risk Assessment (Signed)
Suicide Risk Assessment  Discharge Assessment     Demographic Factors:  Low socioeconomic status, Unemployed and female  Mental Status Per Nursing Assessment::   On Admission:     Current Mental Status by Physician: patient denies suicidal ideation, intent or plan  Loss Factors: Decrease in vocational status and Financial problems/change in socioeconomic status  Historical Factors: Family history of mental illness or substance abuse and Impulsivity  Risk Reduction Factors:   Sense of responsibility to family, Living with another person, especially a relative and Positive social support  Continued Clinical Symptoms:  Alcohol/Substance Abuse/Dependencies  Cognitive Features That Contribute To Risk:  Closed-mindedness Polarized thinking    Suicide Risk:  Minimal: No identifiable suicidal ideation.  Patients presenting with no risk factors but with morbid ruminations; may be classified as minimal risk based on the severity of the depressive symptoms  Discharge Diagnoses:   AXIS I:  Major depressive disorder, recurrent episode, severe, specified as with psychotic behavior              Cannabis use disorder. Cocaine abuse  AXIS II:  Deferred AXIS III:   Past Medical History  Diagnosis Date   AXIS IV:  other psychosocial or environmental problems and problems related to social environment AXIS V:  61-70 mild symptoms  Plan Of Care/Follow-up recommendations:  Activity:  as tolerated Diet:  healthy Tests:  routine Other:  patient to keep her after care appointment   Is patient on multiple antipsychotic therapies at discharge:  No   Has Patient had three or more failed trials of antipsychotic monotherapy by history:  No  Recommended Plan for Multiple Antipsychotic Therapies: N/A  Mersadies Petree,MD 09/11/2012, 8:49 AM

## 2012-09-11 NOTE — Discharge Summary (Signed)
Physician Discharge Summary Note  Patient:  Diane Rosario is an 37 y.o., female MRN:  [id number] DOB:  08-Sep-1975 Patient phone:  [phone] (home)  Patient address:   [address] [place] [address],   Date of Admission:  09/05/2012 Date of Discharge: 09/11/2012   Reason for Admission:  MDD recurrent severe with psychotic features  Discharge Diagnoses: Principal Problem:   Major depressive disorder, recurrent episode, severe, specified as with psychotic behavior Active Problems:   Posttraumatic stress disorder   Cannabis abuse   Cocaine abuse  Review of Systems  Constitutional: Negative.  Negative for fever, chills, weight loss, malaise/fatigue and diaphoresis.  HENT: Negative for congestion and sore throat.   Eyes: Negative for blurred vision, double vision and photophobia.  Respiratory: Negative for cough, shortness of breath and wheezing.   Cardiovascular: Negative for chest pain, palpitations and PND.  Gastrointestinal: Negative for heartburn, nausea, vomiting, abdominal pain, diarrhea and constipation.  Musculoskeletal: Negative for myalgias, joint pain and falls.  Neurological: Negative for dizziness, tingling, tremors, sensory change, speech change, focal weakness, seizures, loss of consciousness, weakness and headaches.  Endo/Heme/Allergies: Negative for polydipsia. Does not bruise/bleed easily.  Psychiatric/Behavioral: Negative for depression, suicidal ideas, hallucinations, memory loss and substance abuse. The patient is not nervous/anxious and does not have insomnia.    Discharge Diagnoses:  AXIS I: Major depressive disorder, recurrent episode, severe, specified as with psychotic behavior  Cannabis use disorder. Cocaine abuse  AXIS II: Deferred  AXIS III:  Past Medical History   Diagnosis  Date   AXIS IV: other psychosocial or environmental problems and problems related to social environment  AXIS V: 61-70 mild symptoms  Level of Care:  OP  Hospital  Course:  Diane Rosario was admitted after presenting to the ED reporting auditory hallucinations with command hallucinations to kill herself. She endorsed a plan to overdose on her home medications and admitted to being non-compliant with them over the past week. She reported increasing depression and symptoms of PTSD since her husband was murdered in 2007.  Diane Rosario also notes that she self medicates with alcohol, THC and crack cocaine. She was given medical clearance and transferred to [facility] for further stabilization and care.      Her labs were unremarkable with the exception of her UDS which was positive for cocaine.  She was evaluated and reported that she had increasing depression, was isolating, anhedonia, poor sleep, weight loss, with poor appetite, suicidal thoughts with command hallucinations to kill herself.  Diane Rosario was stated on her medication regimen and encouraged to participate in unit programming for coping skills. She isolated to her room and missed many of the groups. She was not a behavioral problem and took her medications as requested.      She showed little motivation for recovery. When she did attend the groups on the unit her participation was minimal to none.  She acknowledged the medication did seem to help and reported no side effects. Her behaviors were appropriate but her affect was guarded.  She showed little insight and her judgement was fair.       On the day of discharge she denied AVH,  Reported no SI/HI. She agreed to the plan of follow up as noted below. Diane Rosario was in improved condition than upon arrival and was felt to be stable for discharge out.  Consults:  None  Significant Diagnostic Studies:  labs: CBC, UA, UDS, CMP  Discharge Vitals:   Blood pressure 92/62, pulse 74, temperature 98 F (  36.7 C), temperature source Oral, resp. rate 16, height 5' 5.5" (1.664 m), weight 39.009 kg (86 lb), last menstrual period 08/06/2012. Body mass index is 14.09 kg/(m^2). Lab Results:   No  results found for this or any previous visit (from the past 72 hour(s)).  Physical Findings: AIMS: Facial and Oral Movements Muscles of Facial Expression: None, normal Lips and Perioral Area: None, normal Jaw: None, normal Tongue: None, normal,Extremity Movements Upper (arms, wrists, hands, fingers): None, normal Lower (legs, knees, ankles, toes): None, normal, Trunk Movements Neck, shoulders, hips: None, normal, Overall Severity Severity of abnormal movements (highest score from questions above): None, normal Incapacitation due to abnormal movements: None, normal Patient's awareness of abnormal movements (rate only patient's report): No Awareness, Dental Status Current problems with teeth and/or dentures?: No Does patient usually wear dentures?: No  CIWA:  CIWA-Ar Total: 1 COWS:  COWS Total Score: 2  Psychiatric Specialty Exam: See Psychiatric Specialty Exam and Suicide Risk Assessment completed by Attending Physician prior to discharge.  Discharge destination:  Home  Is patient on multiple antipsychotic therapies at discharge:  No   Has Patient had three or more failed trials of antipsychotic monotherapy by history:  No  Recommended Plan for Multiple Antipsychotic Therapies: Not applicable   Discharge Orders   Future Orders Complete By Expires     Diet - low sodium heart healthy  As directed     Discharge instructions  As directed     Comments:      Take all of your medications as directed. Be sure to keep all of your follow up appointments.  If you are unable to keep your follow up appointment, call your Doctor's office to let them know, and reschedule.  Make sure that you have enough medication to last until your appointment. Be sure to get plenty of rest. Going to bed at the same time each night will help. Try to avoid sleeping during the day.  Increase your activity as tolerated. Regular exercise will help you to sleep better and improve your mental health. Eating a heart  healthy diet is recommended. Try to avoid salty or fried foods. Be sure to avoid all alcohol and illegal drugs.    Increase activity slowly  As directed         Medication List    STOP taking these medications       multivitamin with minerals Tabs      TAKE these medications     Indication   benztropine 1 MG tablet  Commonly known as:  COGENTIN  Take 1 tablet (1 mg total) by mouth at bedtime. For prevention of side effects.   Indication:  Extrapyramidal Reaction caused by Medications     FLUoxetine 10 MG tablet  Commonly known as:  PROZAC  Take 1 tablet (10 mg total) by mouth daily. For anxiety and depression.   Indication:  Depression     haloperidol 5 MG tablet  Commonly known as:  HALDOL  Take 1 tablet (5 mg total) by mouth at bedtime. For psychosis and sleep.   Indication:  Psychosis     haloperidol decanoate 100 MG/ML injection  Commonly known as:  HALDOL DECANOATE  Inject 0.5 mLs (50 mg total) into the muscle every 30 (thirty) days. Last dose was on 09/10/2012.   Indication:  Psychosis, Schizophrenia     traZODone 50 MG tablet  Commonly known as:  DESYREL  Take 1 tablet (50 mg total) by mouth at bedtime and may repeat dose one  time if needed. For insomnia.   Indication:  Trouble Sleeping         Follow-up recommendations:   Activities: Resume activity as tolerated. Diet: Heart healthy low sodium diet Tests: Follow up testing will be determined by your out patient provider.  Comments:    Total Discharge Time:  Greater than 30 minutes.  Signed: Samvel Zinn 09/11/2012, 9:28 AM

## 2012-09-11 NOTE — Progress Notes (Signed)
[facility] Adult Case Management Discharge Plan :  Will you be returning to the same living situation after discharge: Yes,  home At discharge, do you have transportation home?:Yes,  finace Do you have the ability to pay for your medications:Yes,  insurance  Release of information consent forms completed and in the chart;  Patient's signature needed at discharge.  Patient to Follow up at:   Patient denies SI/HI:   Yes,  yes    Safety Planning and Suicide Prevention discussed:  Yes,  yes  [name] B 09/11/2012, 10:03 AM

## 2012-09-12 NOTE — [facility] Suicide Risk Assessment (Signed)
[facility] INPATIENT:  Family/Significant Other Suicide Prevention Education  Suicide Prevention Education:  Contact Attempts: Diane Rosario, mother, [phone] has been identified by the patient as the family member/significant other with whom the patient will be residing, and identified as the person(s) who will aid the patient in the event of a mental health crisis.  With written consent from the patient, two attempts were made to provide suicide prevention education, prior to and/or following the patient's discharge.  We were unsuccessful in providing suicide prevention education.  A suicide education pamphlet was given to the patient to share with family/significant other.  Date and time of first attempt:  09/11/12  11:45 Date and time of second attempt:  09/12/12  8:15  Diane Rosario 09/12/2012, 1:10 PM

## 2012-09-13 NOTE — Progress Notes (Signed)
Patient Discharge Instructions:  After Visit Summary (AVS):   Faxed to:  09/13/12 Psychiatric Admission Assessment Note:   Faxed to:  09/13/12 Suicide Risk Assessment - Discharge Assessment:   Faxed to:  09/13/12 Faxed/Sent to the Next Level Care provider:  09/13/12 Faxed to [facility] @ [phone]  Jerelene Redden, 09/13/2012, 3:25 PM

## 2012-09-18 NOTE — Discharge Summary (Signed)
Seen and agreed. Cherice Glennie, MD 

## 2013-06-16 ENCOUNTER — Emergency Department (HOSPITAL_COMMUNITY)
Admission: EM | Admit: 2013-06-16 | Discharge: 2013-06-16 | Disposition: A | Discharge status: Home / Self Care | Payer: Medicaid Other | Attending: Emergency Medicine | Admitting: Emergency Medicine

## 2013-06-16 ENCOUNTER — Encounter (HOSPITAL_COMMUNITY): Payer: Self-pay | Admitting: Emergency Medicine

## 2013-06-16 DIAGNOSIS — W503XXA Accidental bite by another person, initial encounter: Secondary | ICD-10-CM | POA: Insufficient documentation

## 2013-06-16 DIAGNOSIS — Z79899 Other long term (current) drug therapy: Secondary | ICD-10-CM | POA: Insufficient documentation

## 2013-06-16 DIAGNOSIS — K08109 Complete loss of teeth, unspecified cause, unspecified class: Secondary | ICD-10-CM | POA: Insufficient documentation

## 2013-06-16 DIAGNOSIS — S01501A Unspecified open wound of lip, initial encounter: Secondary | ICD-10-CM | POA: Insufficient documentation

## 2013-06-16 DIAGNOSIS — F17200 Nicotine dependence, unspecified, uncomplicated: Secondary | ICD-10-CM | POA: Insufficient documentation

## 2013-06-16 DIAGNOSIS — Y929 Unspecified place or not applicable: Secondary | ICD-10-CM | POA: Insufficient documentation

## 2013-06-16 DIAGNOSIS — K122 Cellulitis and abscess of mouth: Secondary | ICD-10-CM

## 2013-06-16 DIAGNOSIS — Z8659 Personal history of other mental and behavioral disorders: Secondary | ICD-10-CM | POA: Insufficient documentation

## 2013-06-16 DIAGNOSIS — K002 Abnormalities of size and form of teeth: Secondary | ICD-10-CM | POA: Insufficient documentation

## 2013-06-16 DIAGNOSIS — Y9389 Activity, other specified: Secondary | ICD-10-CM | POA: Insufficient documentation

## 2013-06-16 MED ORDER — PENICILLIN V POTASSIUM 500 MG PO TABS: 500.0000 mg | ORAL_TABLET | Freq: Four times a day (QID) | ORAL | Status: AC

## 2013-06-16 MED ORDER — NAPROXEN 500 MG PO TABS: 500.0000 mg | ORAL_TABLET | Freq: Two times a day (BID) | ORAL | Status: DC

## 2013-06-16 NOTE — Discharge Instructions (Signed)
Take the prescribed medication as directed. Follow-up with your dentist on Wednesday as previously scheduled. Return to the ED for new or worsening symptoms.
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
## 2013-06-16 NOTE — ED Provider Notes (Signed)
Medical screening examination/treatment/procedure(s) were performed by non-physician practitioner and as supervising physician I was immediately available for consultation/collaboration.   EKG Interpretation None       Orlie Dakin, MD 06/16/13 [id number]
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
## 2014-06-11 ENCOUNTER — Encounter (HOSPITAL_COMMUNITY): Payer: Self-pay | Admitting: *Deleted

## 2014-06-11 ENCOUNTER — Emergency Department (HOSPITAL_COMMUNITY)
Admission: EM | Admit: 2014-06-11 | Discharge: 2014-06-11 | Discharge status: Against Medical Advice | Payer: Medicaid Other | Attending: Emergency Medicine | Admitting: Emergency Medicine

## 2014-06-11 DIAGNOSIS — R6884 Jaw pain: Secondary | ICD-10-CM | POA: Diagnosis present

## 2014-06-11 DIAGNOSIS — Z720 Tobacco use: Secondary | ICD-10-CM | POA: Diagnosis not present

## 2014-06-11 NOTE — ED Notes (Signed)
Pt states that she has had left sided jaw pain for 3 days; pt states that it hurts to eat and smoke a cigarette; pt with swelling to left side of face

## 2014-06-11 NOTE — ED Notes (Signed)
Pt called back. No response.

## 2014-06-11 NOTE — ED Notes (Signed)
Pt called back. No response

## 2014-06-12 ENCOUNTER — Encounter (HOSPITAL_COMMUNITY): Payer: Self-pay

## 2014-06-12 ENCOUNTER — Emergency Department (HOSPITAL_COMMUNITY)
Admission: EM | Admit: 2014-06-12 | Discharge: 2014-06-12 | Disposition: A | Discharge status: Home / Self Care | Payer: Medicaid Other | Attending: Emergency Medicine | Admitting: Emergency Medicine

## 2014-06-12 DIAGNOSIS — Z791 Long term (current) use of non-steroidal anti-inflammatories (NSAID): Secondary | ICD-10-CM | POA: Diagnosis not present

## 2014-06-12 DIAGNOSIS — Z8659 Personal history of other mental and behavioral disorders: Secondary | ICD-10-CM | POA: Diagnosis not present

## 2014-06-12 DIAGNOSIS — R6884 Jaw pain: Secondary | ICD-10-CM | POA: Diagnosis present

## 2014-06-12 DIAGNOSIS — Z720 Tobacco use: Secondary | ICD-10-CM | POA: Insufficient documentation

## 2014-06-12 DIAGNOSIS — M2660 Temporomandibular joint disorder, unspecified: Secondary | ICD-10-CM | POA: Insufficient documentation

## 2014-06-12 DIAGNOSIS — M26609 Unspecified temporomandibular joint disorder, unspecified side: Secondary | ICD-10-CM

## 2014-06-12 MED ORDER — NAPROXEN 500 MG PO TABS: 500.0000 mg | ORAL_TABLET | Freq: Two times a day (BID) | ORAL | Status: DC

## 2014-06-12 NOTE — ED Notes (Signed)
Patient reports she has left-sided jaw pain that has gradually worsened over the past three days.  She reports she is unable to eat or drink due to pain.

## 2014-06-12 NOTE — ED Provider Notes (Signed)
CSN: [id number]     Arrival date & time 06/12/14  1905 History  This chart was scribed for non-physician practitioner, Hanley Hays, PA-C,  working with Wandra Arthurs, MD, by Jeanell Sparrow, ED Scribe. This patient was seen in room WTR8/WTR8 and the patient's care was started at 7:38 PM.   Chief Complaint  Patient presents with  . Jaw Pain   The history is provided by the patient. No language interpreter was used.   HPI Comments: Diane Rosario is a 39 y.o. female who presents to the Emergency Department complaining of constant moderate left sided jaw pain that started 3 days ago. She states that her pain started 3 days ago, and she denies any jaw injury. She reports that the pain has worsened over the past three days. She states that the pain radiates up to her left ear when she chews. She currently rates the severity of the pain as a 3/10. She reports that the pain is exacerbated by chewing and reports clicking and popping with mastication.  She reports that her pain is worse with opening her mouth all the way. She states that she took tylenol and an antibiotic without any relief this morning. She states that she has no prior hx of TMJ problems. She denies any dental pain, sore throat, abdominal pain, nausea, vomiting, fever, chills, eye pain, ear pain or discharge, postnasal drip, cough, wheezing, or SOB. She reports she has been able to eat and drink, she just has pain with chewing.   Past Medical History  Diagnosis Date  . Bipolar 1 disorder     pt reports bipolar, pt reports Auditory hallucinations  . Depression   . PTSD (post-traumatic stress disorder)     husband was murdered   Past Surgical History  Procedure Laterality Date  . Eye surgery     No family history on file. History  Substance Use Topics  . Smoking status: Current Every Day Smoker -- 0.25 packs/day for 10 years    Types: Cigarettes  . Smokeless tobacco: Not on file  . Alcohol Use: Yes     Comment: occasionally    OB History    No data available     Review of Systems  Constitutional: Negative for fever and chills.  HENT: Negative for dental problem, drooling, ear discharge, ear pain, postnasal drip, sore throat and trouble swallowing.   Eyes: Negative for pain.  Respiratory: Negative for cough, shortness of breath and wheezing.   Gastrointestinal: Negative for nausea, vomiting and abdominal pain.  Skin: Negative for rash.  Neurological: Negative for numbness.    Allergies  Review of patient's allergies indicates no known allergies.  Home Medications   Prior to Admission medications   Medication Sig Start Date End Date Taking? Authorizing Provider  naproxen (NAPROSYN) 500 MG tablet Take 1 tablet (500 mg total) by mouth 2 (two) times daily with a meal. 06/12/14   Hanley Hays, PA-C  PRESCRIPTION MEDICATION Unknown injections every 3 weeks per patient    Historical Provider, MD   BP 117/71 mmHg  Pulse 104  Temp(Src) 98.3 F (36.8 C) (Oral)  Resp 18  Ht 5\' 5"  (1.651 m)  Wt 91 lb (41.277 kg)  BMI 15.14 kg/m2  SpO2 95% Physical Exam  Constitutional: She is oriented to person, place, and time. She appears well-developed and well-nourished. No distress.  Nontoxic appearing.  HENT:  Head: Normocephalic and atraumatic.  Right Ear: Tympanic membrane and external ear normal.  Left Ear: External  ear normal.  Nose: Nose normal.  Mouth/Throat: Oropharynx is clear and moist. No oropharyngeal exudate.  Left TMJ is tender with jaw range of motion. There is popping at her TMJ with mastication. No facial swelling. No dental abscesses or induration. She is able to open her jaw fully. Speech is clear and coherent.  Left TM has cerumen impaction. Right tympanic membrane is pearly gray without erythema or loss of landmarks..  No tonsillar hypertrophy without exudates. Uvula midline without edema.   Eyes: Conjunctivae are normal. Pupils are equal, round, and reactive to light. Right eye exhibits  no discharge. Left eye exhibits no discharge.  Neck: Normal range of motion. Neck supple. No JVD present. No tracheal deviation present.  Cardiovascular: Normal rate, regular rhythm, normal heart sounds and intact distal pulses.   HR 88.   Pulmonary/Chest: Effort normal and breath sounds normal. No respiratory distress. She has no wheezes. She has no rales.  Abdominal: Soft. There is no tenderness.  Musculoskeletal: Normal range of motion.  Lymphadenopathy:    She has no cervical adenopathy.  Neurological: She is alert and oriented to person, place, and time. No cranial nerve deficit. Coordination normal.  Cranial nerves are intact.  Skin: Skin is warm and dry. No rash noted. She is not diaphoretic. No erythema. No pallor.  Psychiatric: She has a normal mood and affect. Her behavior is normal.  Nursing note and vitals reviewed.   ED Course  Procedures (including critical care time) DIAGNOSTIC STUDIES: Oxygen Saturation is 95% on RA, normal by my interpretation.    COORDINATION OF CARE: 7:42 PM- Pt advised of plan for treatment which includes medication and pt agrees.  Labs Review Labs Reviewed - No data to display  Imaging Review No results found.   EKG Interpretation None      Filed Vitals:   06/12/14 1911  BP: 117/71  Pulse: 104  Temp: 98.3 F (36.8 C)  TempSrc: Oral  Resp: 18  Height: 5\' 5"  (1.651 m)  Weight: 91 lb (41.277 kg)  SpO2: 95%     MDM   Meds given in ED:  Medications - No data to display  Discharge Medication List as of 06/12/2014  7:51 PM      Final diagnoses:  TMJ (temporomandibular joint disorder)   This is a 39 y.o. female who presents to the Emergency Department complaining of constant moderate left sided jaw pain that started 3 days ago. She states that her pain started 3 days ago, and she denies any jaw injury.  She complains of popping and clicking with mastication. She reports she is able to eat and drink she just has pain with  chewing. The patient is afebrile and nontoxic-appearing. The patient's left TMJ is tender to palpation during jaw range of motion. There is popping on her left side with mastication. The patient's mouth and throat are clear. Education provided on TMJ disorders. Patient given a prescription for Naprosyn. I encouraged soft diet until her pain improves. Education given on jaw range of motion exercises. I encouraged follow-up with on-call dentist and to make an appointment within 48 hours. I advised the patient to follow-up with their primary care provider this week. I advised the patient to return to the emergency department with new or worsening symptoms or new concerns. The patient verbalized understanding and agreement with plan.   I personally performed the services described in this documentation, which was scribed in my presence. The recorded information has been reviewed and is accurate.  Hanley Hays, PA-C 06/12/14 2112  Wandra Arthurs, MD 06/12/14 [phone]

## 2014-06-12 NOTE — Discharge Instructions (Signed)
Temporomandibular Problems  Temporomandibular joint (TMJ) dysfunction means there are problems with the joint between your jaw and your skull. This is a joint lined by cartilage like other joints in your body but also has a small disc in the joint which keeps the bones from rubbing on each other. These joints are like other joints and can get inflamed (sore) from arthritis and other problems. When this joint gets sore, it can cause headaches and pain in the jaw and the face. CAUSES  Usually the arthritic types of problems are caused by soreness in the joint. Soreness in the joint can also be caused by overuse. This may come from grinding your teeth. It may also come from mis-alignment in the joint. DIAGNOSIS Diagnosis of this condition can often be made by history and exam. Sometimes your caregiver may need X-rays or an MRI scan to determine the exact cause. It may be necessary to see your dentist to determine if your teeth and jaws are lined up correctly. TREATMENT  Most of the time this problem is not serious; however, sometimes it can persist (become chronic). When this happens medications that will cut down on inflammation (soreness) help. Sometimes a shot of cortisone into the joint will be helpful. If your teeth are not aligned it may help for your dentist to make a splint for your mouth that can help this problem. If no physical problems can be found, the problem may come from tension. If tension is found to be the cause, biofeedback or relaxation techniques may be helpful. HOME CARE INSTRUCTIONS   Later in the day, applications of ice packs may be helpful. Ice can be used in a plastic bag with a towel around it to prevent frostbite to skin. This may be used about every 2 hours for 20 to 30 minutes, as needed while awake, or as directed by your caregiver.  Only take over-the-counter or prescription medicines for pain, discomfort, or fever as directed by your caregiver.  If physical therapy was  prescribed, follow your caregiver's directions.  Wear mouth appliances as directed if they were given. Document Released: 12/21/2000 Document Revised: 06/20/2011 Document Reviewed: 03/30/2008 [facility] Patient Information 2015 [place], [place]. This information is not intended to replace advice given to you by your health care provider. Make sure you discuss any questions you have with your health care provider.  Jaw Range of Motion Exercises Do the exercises below to prevent problems opening your mouth after a jaw fracture, TMJ, or surgery. HOME CARE Warm up  Open your mouth as wide as possible for 15 seconds.  Then, close your mouth and rest. Repeat this 8 times. Exercises  Stick your jaw forward for 15 seconds. Rest your jaw. Repeat this 8 times.  Move your jaw right for 15 seconds. Rest your jaw and repeat 8 times.  Move your jaw left for 15 seconds. Rest your jaw and repeat 8 times.  Put your middle finger and thumb in your mouth. Push with your thumb on your top teeth and your middle finger on your bottom teeth opening your mouth.  Stretch your mouth open as far as possible. Repeat 8 times.  Chew gum when you are not doing exercises.  Use moist heat packs 3 to 4 times a day on your jaw area. Document Released: 03/10/2008 Document Revised: 06/20/2011 Document Reviewed: 03/10/2008 [facility] Patient Information 2015 [place], [place]. This information is not intended to replace advice given to you by your health care provider. Make sure you discuss any  questions you have with your health care provider.

## 2014-06-17 ENCOUNTER — Ambulatory Visit: Payer: Self-pay | Admitting: *Deleted

## 2014-07-17 ENCOUNTER — Encounter: Payer: Medicaid Other | Attending: Obstetrics & Gynecology | Admitting: *Deleted

## 2014-07-17 ENCOUNTER — Encounter: Payer: Self-pay | Admitting: *Deleted

## 2014-07-17 VITALS — Ht 66.0 in | Wt 88.0 lb

## 2014-07-17 DIAGNOSIS — Z713 Dietary counseling and surveillance: Secondary | ICD-10-CM | POA: Insufficient documentation

## 2014-07-17 DIAGNOSIS — E639 Nutritional deficiency, unspecified: Secondary | ICD-10-CM | POA: Insufficient documentation

## 2014-07-17 DIAGNOSIS — E441 Mild protein-calorie malnutrition: Secondary | ICD-10-CM | POA: Diagnosis present

## 2014-07-17 NOTE — Patient Instructions (Signed)
Aim for 3 meals/day Aim for 2 glasses whole milk/day Aim for 2 glasses juice/day Aim to take multivitamin each day  Aim to eat about every 3-4 hours (4 pm, 8 pm, 10-12 pm) Eat a meal with brother at 4-5 pm  Go to foods: cereal or ham and cheese sandwich at night when he's asleep Try to have grapes, apples, banana once a day every day Try to have a vegetable each day, if possible (steamable are fine)

## 2014-07-17 NOTE — Progress Notes (Signed)
Medical Nutrition Therapy:  Appt start time: 1030 end time:  1115.   Assessment:  Primary concerns today: Diane Rosario is here with a support person pertaining to underweight status.  She states she is fine with her weight.  She states she has never been over 100 lb.  Friend says she has lost weight- she used to be 91 lb in March of this year and now she weighs 88 lb.  At medical visit in February she was 87 pounds, so her weight has fluctuated recently.  Friend states "she does eat a lot."  However, dietary recall reveals eating 1 meal/day  .Recent labs indicated elevated cholesterol, not uncommon in malnutrition.   Heaviest weight: 113 lb  (possibly during pregnancy??) Lowest weight: 87 pounds Consistent weight is: 94 lb Would like to weigh: 110 lb  Lives with brother.  Shares grocery shopping with brother and he does the cooking.  He mostly fries foods. She might eat out 2-3 times/week: [employer], [employer], [employer], [employer].  When at home she eats in the living room while watching tv.  She is not a fast eater, but not slow either.   She is not working during the day. She sleeps until 3 pm and goes to bed around 3 am.  She likes to watch tv late at night.  She is a current smoker  Preferred Learning Style:   No preference indicated   Learning Readiness: difficult to assess.  States "weight is fine" but also states she would like to weigh 110 lb   MEDICATIONS: see list.  Forgets to take meds sometimes.  Health department recommended multivitamin, but patient has not implemented that.   DIETARY INTAKE:  Usual eating pattern includes 1 meals and ad lib snacks per day.  Everyday foods include fried chicken and mashed potatoes.  Avoided foods include none.    24-hr recall:  2-3 pm: bacon, eggs, grits  Snk ( PM): cookies, honey bun Beverages: sometimes water or soda, but doesn't drink much  Usual physical activity: none  Estimated energy needs: 2200 calories 2758 g carbohydrates 110  g protein 73 g fat    Nutritional Diagnosis:  NI-1.4 Inadequate energy intake As related to meal skipping.  As evidenced by BMI of 14.2    Intervention:  Nutrition counseling provided.  Discussed importance of adequate nutrition for the body as well as the mind.  [name] has several mental health challenges and poor nutrition only exacerbates those symptoms.  Improved nutrition could benefit her mental health, as well as her physical health.  Also discussed need for adequate hydration.  She does not like to drink water, but will drink milk and juice.  She is a chronic smoker and has increased nutrient needs.  Recommended 3 meals/day and avoid meal skipping.  Discussed MyPlate recommendations for meal planning.  She will not be able to eat large meals at first due to decreased metabolism from malnutrition.  We will start low and increase her energy intake slowly to promote 0.5-1.0 lb/week.  Recommended daily multivitamin.  Due to her mental health status, she might not also be relied upon to take care of her own health.  Support person present today states she will inform Diane Rosario's brother, whom she lives with, about her nutrition recommendations so that he can take care of her needs.   Goals: Aim for 3 meals/day Aim for 2 glasses whole milk/day Aim for 2 glasses juice/day Aim to take multivitamin each day  Aim to eat about every 3-4  hours (4 pm, 8 pm, 10-12 pm) Eat a meal with brother at 4-5 pm  Go to foods: cereal or ham and cheese sandwich at night when he's asleep Try to have grapes, apples, banana once a day every day Try to have a vegetable each day, if possible (steamable are fine)   Teaching Method Utilized:  Visual Auditory   Barriers to learning/adherence to lifestyle change: mental health  Demonstrated degree of understanding via:  Teach Back   Monitoring/Evaluation:  Dietary intake, exercise, and body weight in 1 month(s).

## 2014-08-21 ENCOUNTER — Ambulatory Visit: Payer: Self-pay | Admitting: *Deleted

## 2016-10-17 ENCOUNTER — Encounter (HOSPITAL_COMMUNITY): Payer: Self-pay | Admitting: Emergency Medicine

## 2016-10-17 ENCOUNTER — Emergency Department (HOSPITAL_COMMUNITY)
Admission: EM | Admit: 2016-10-17 | Discharge: 2016-10-17 | Disposition: A | Discharge status: Home / Self Care | Payer: Medicaid Other | Attending: Emergency Medicine | Admitting: Emergency Medicine

## 2016-10-17 DIAGNOSIS — Z5321 Procedure and treatment not carried out due to patient leaving prior to being seen by health care provider: Secondary | ICD-10-CM | POA: Insufficient documentation

## 2016-10-17 DIAGNOSIS — R2232 Localized swelling, mass and lump, left upper limb: Secondary | ICD-10-CM | POA: Diagnosis present

## 2016-10-17 NOTE — ED Triage Notes (Signed)
patient c/o knots in left axillary that came up last week that are very painful.

## 2016-10-19 ENCOUNTER — Encounter (HOSPITAL_COMMUNITY): Payer: Self-pay | Admitting: Emergency Medicine

## 2016-10-19 ENCOUNTER — Emergency Department (HOSPITAL_COMMUNITY): Payer: Medicaid Other

## 2016-10-19 ENCOUNTER — Emergency Department (HOSPITAL_COMMUNITY)
Admission: EM | Admit: 2016-10-19 | Discharge: 2016-10-19 | Disposition: A | Discharge status: Home / Self Care | Payer: Medicaid Other | Attending: Emergency Medicine | Admitting: Emergency Medicine

## 2016-10-19 DIAGNOSIS — N3001 Acute cystitis with hematuria: Secondary | ICD-10-CM | POA: Insufficient documentation

## 2016-10-19 DIAGNOSIS — F17210 Nicotine dependence, cigarettes, uncomplicated: Secondary | ICD-10-CM | POA: Diagnosis not present

## 2016-10-19 DIAGNOSIS — N6320 Unspecified lump in the left breast, unspecified quadrant: Secondary | ICD-10-CM

## 2016-10-19 DIAGNOSIS — R590 Localized enlarged lymph nodes: Secondary | ICD-10-CM | POA: Insufficient documentation

## 2016-10-19 DIAGNOSIS — R591 Generalized enlarged lymph nodes: Secondary | ICD-10-CM

## 2016-10-19 DIAGNOSIS — N6323 Unspecified lump in the left breast, lower outer quadrant: Secondary | ICD-10-CM | POA: Insufficient documentation

## 2016-10-19 LAB — RAPID HIV SCREEN (HIV 1/2 AB+AG)
HIV 1/2 Antibodies: NONREACTIVE
HIV-1 P24 Antigen - HIV24: NONREACTIVE

## 2016-10-19 LAB — COMPREHENSIVE METABOLIC PANEL
ALT: 18 U/L (ref 14–54)
AST: 23 U/L (ref 15–41)
Albumin: 4.1 g/dL (ref 3.5–5.0)
Alkaline Phosphatase: 65 U/L (ref 38–126)
Anion gap: 6 (ref 5–15)
BUN: 10 mg/dL (ref 6–20)
CO2: 28 mmol/L (ref 22–32)
Calcium: 9.2 mg/dL (ref 8.9–10.3)
Chloride: 106 mmol/L (ref 101–111)
Creatinine, Ser: 0.86 mg/dL (ref 0.44–1.00)
GFR calc Af Amer: >60 mL/min (ref >60)
GFR calc non Af Amer: >60 mL/min (ref >60)
Glucose, Bld: 95 mg/dL (ref 65–99)
Potassium: 4.3 mmol/L (ref 3.5–5.1)
Sodium: 140 mmol/L (ref 135–145)
Total Bilirubin: 0.5 mg/dL (ref 0.3–1.2)
Total Protein: 7.7 g/dL (ref 6.5–8.1)

## 2016-10-19 LAB — URINALYSIS, ROUTINE W REFLEX MICROSCOPIC
Bilirubin Urine: NEGATIVE
Glucose, UA: NEGATIVE mg/dL
Hgb urine dipstick: NEGATIVE
Ketones, ur: NEGATIVE mg/dL
Nitrite: POSITIVE — AB
Protein, ur: NEGATIVE mg/dL
Specific Gravity, Urine: 1.018 (ref 1.005–1.030)
pH: 7 (ref 5.0–8.0)

## 2016-10-19 LAB — CBC WITH DIFFERENTIAL/PLATELET
Basophils Absolute: 0 10*3/uL (ref 0.0–0.1)
Basophils Relative: 0 %
Eosinophils Absolute: 0.1 10*3/uL (ref 0.0–0.7)
Eosinophils Relative: 1 %
HCT: 38.5 % (ref 36.0–46.0)
Hemoglobin: 13 g/dL (ref 12.0–15.0)
Lymphocytes Relative: 49 %
Lymphs Abs: 2.2 10*3/uL (ref 0.7–4.0)
MCH: 32.8 pg (ref 26.0–34.0)
MCHC: 33.8 g/dL (ref 30.0–36.0)
MCV: 97.2 fL (ref 78.0–100.0)
Monocytes Absolute: 0.4 10*3/uL (ref 0.1–1.0)
Monocytes Relative: 9 %
Neutro Abs: 1.8 10*3/uL (ref 1.7–7.7)
Neutrophils Relative %: 41 %
Platelets: 319 10*3/uL (ref 150–400)
RBC: 3.96 MIL/uL (ref 3.87–5.11)
RDW: 13.8 % (ref 11.5–15.5)
WBC: 4.5 10*3/uL (ref 4.0–10.5)

## 2016-10-19 LAB — PREGNANCY, URINE: Preg Test, Ur: NEGATIVE

## 2016-10-19 IMAGING — CR DG CHEST 2V
2 series · 2 of 2 positions shown · non-contrast
Comparison: [DATE]

CLINICAL DATA: Shortness of breath, chest pain

EXAM:
CHEST  2 VIEW

[w chest pa]
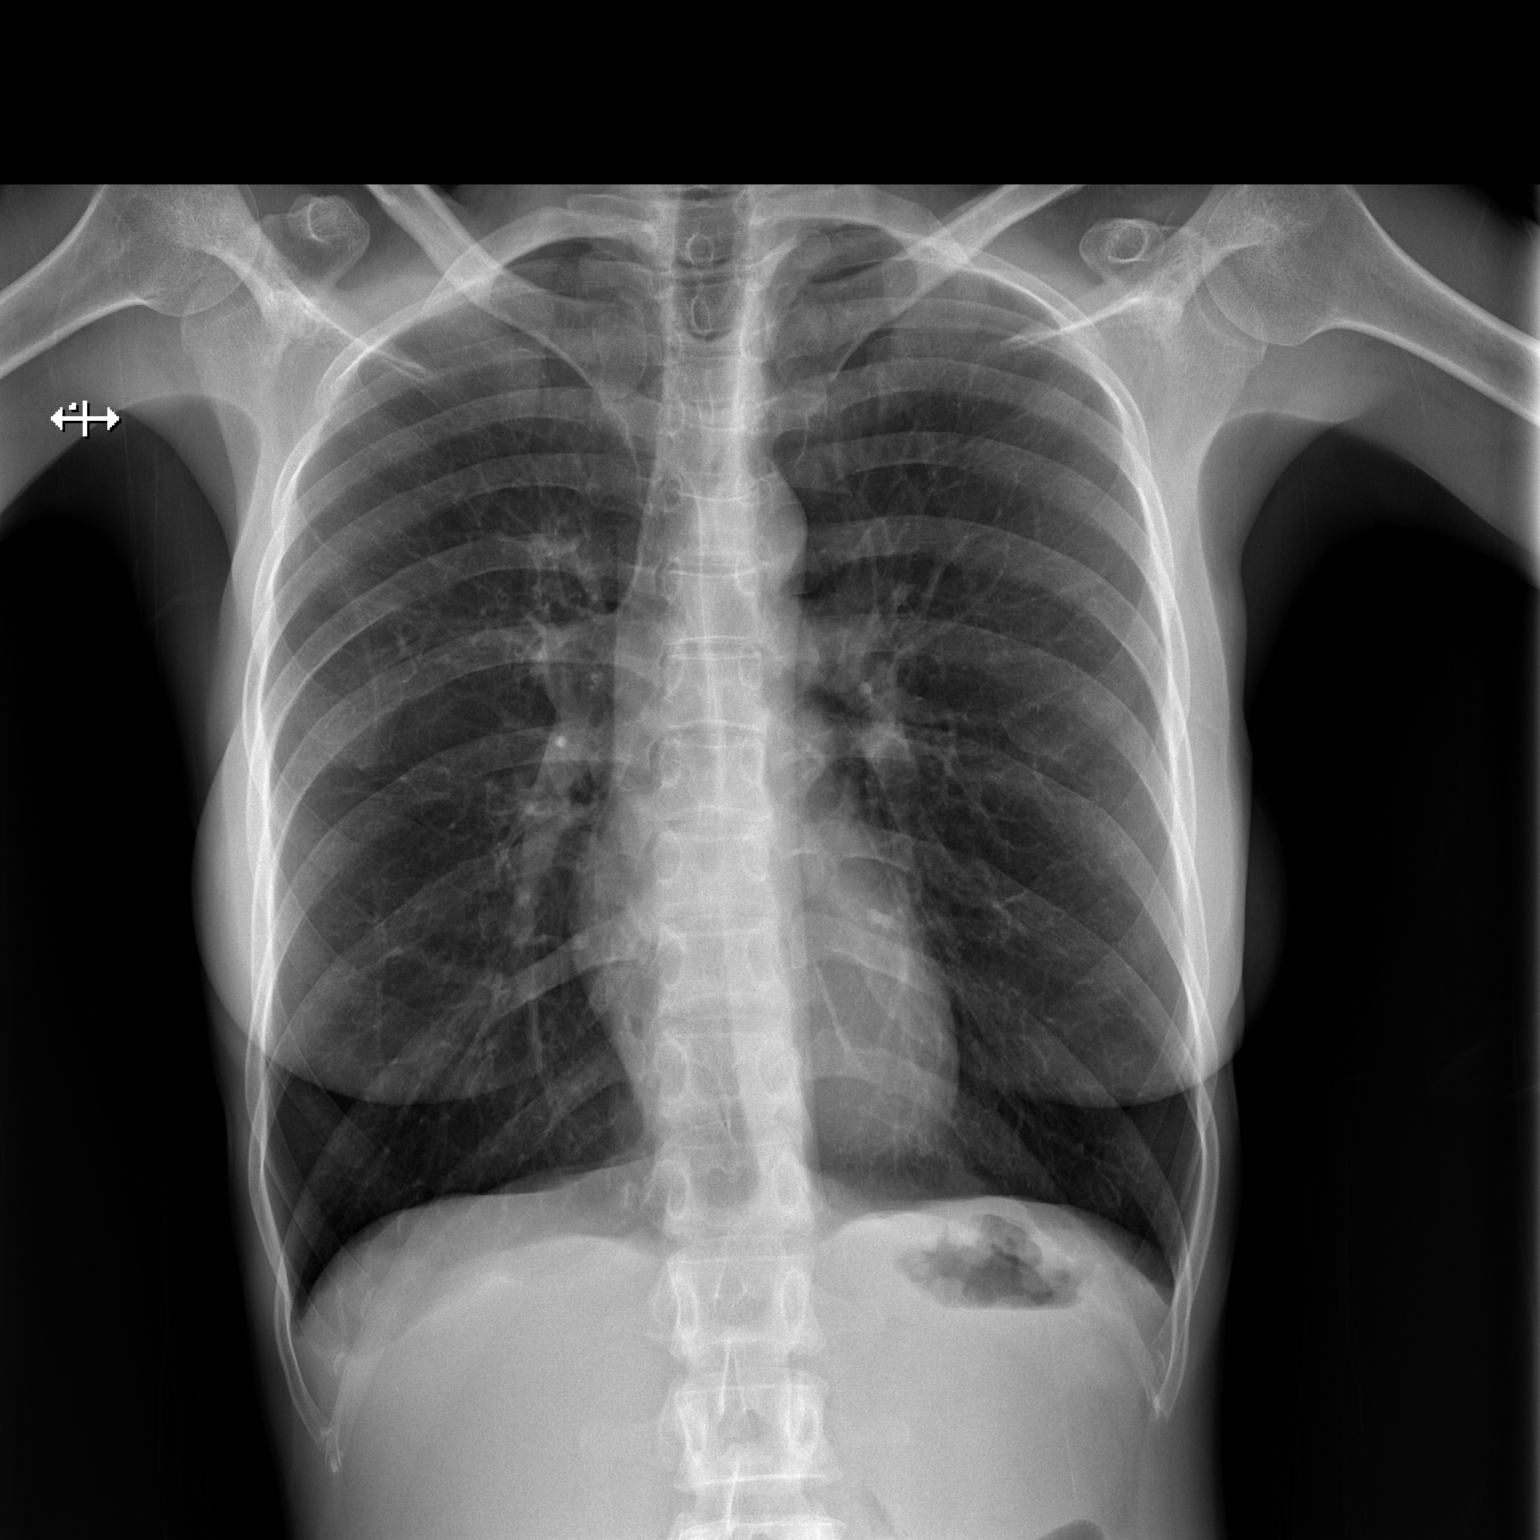

[w chest lat]
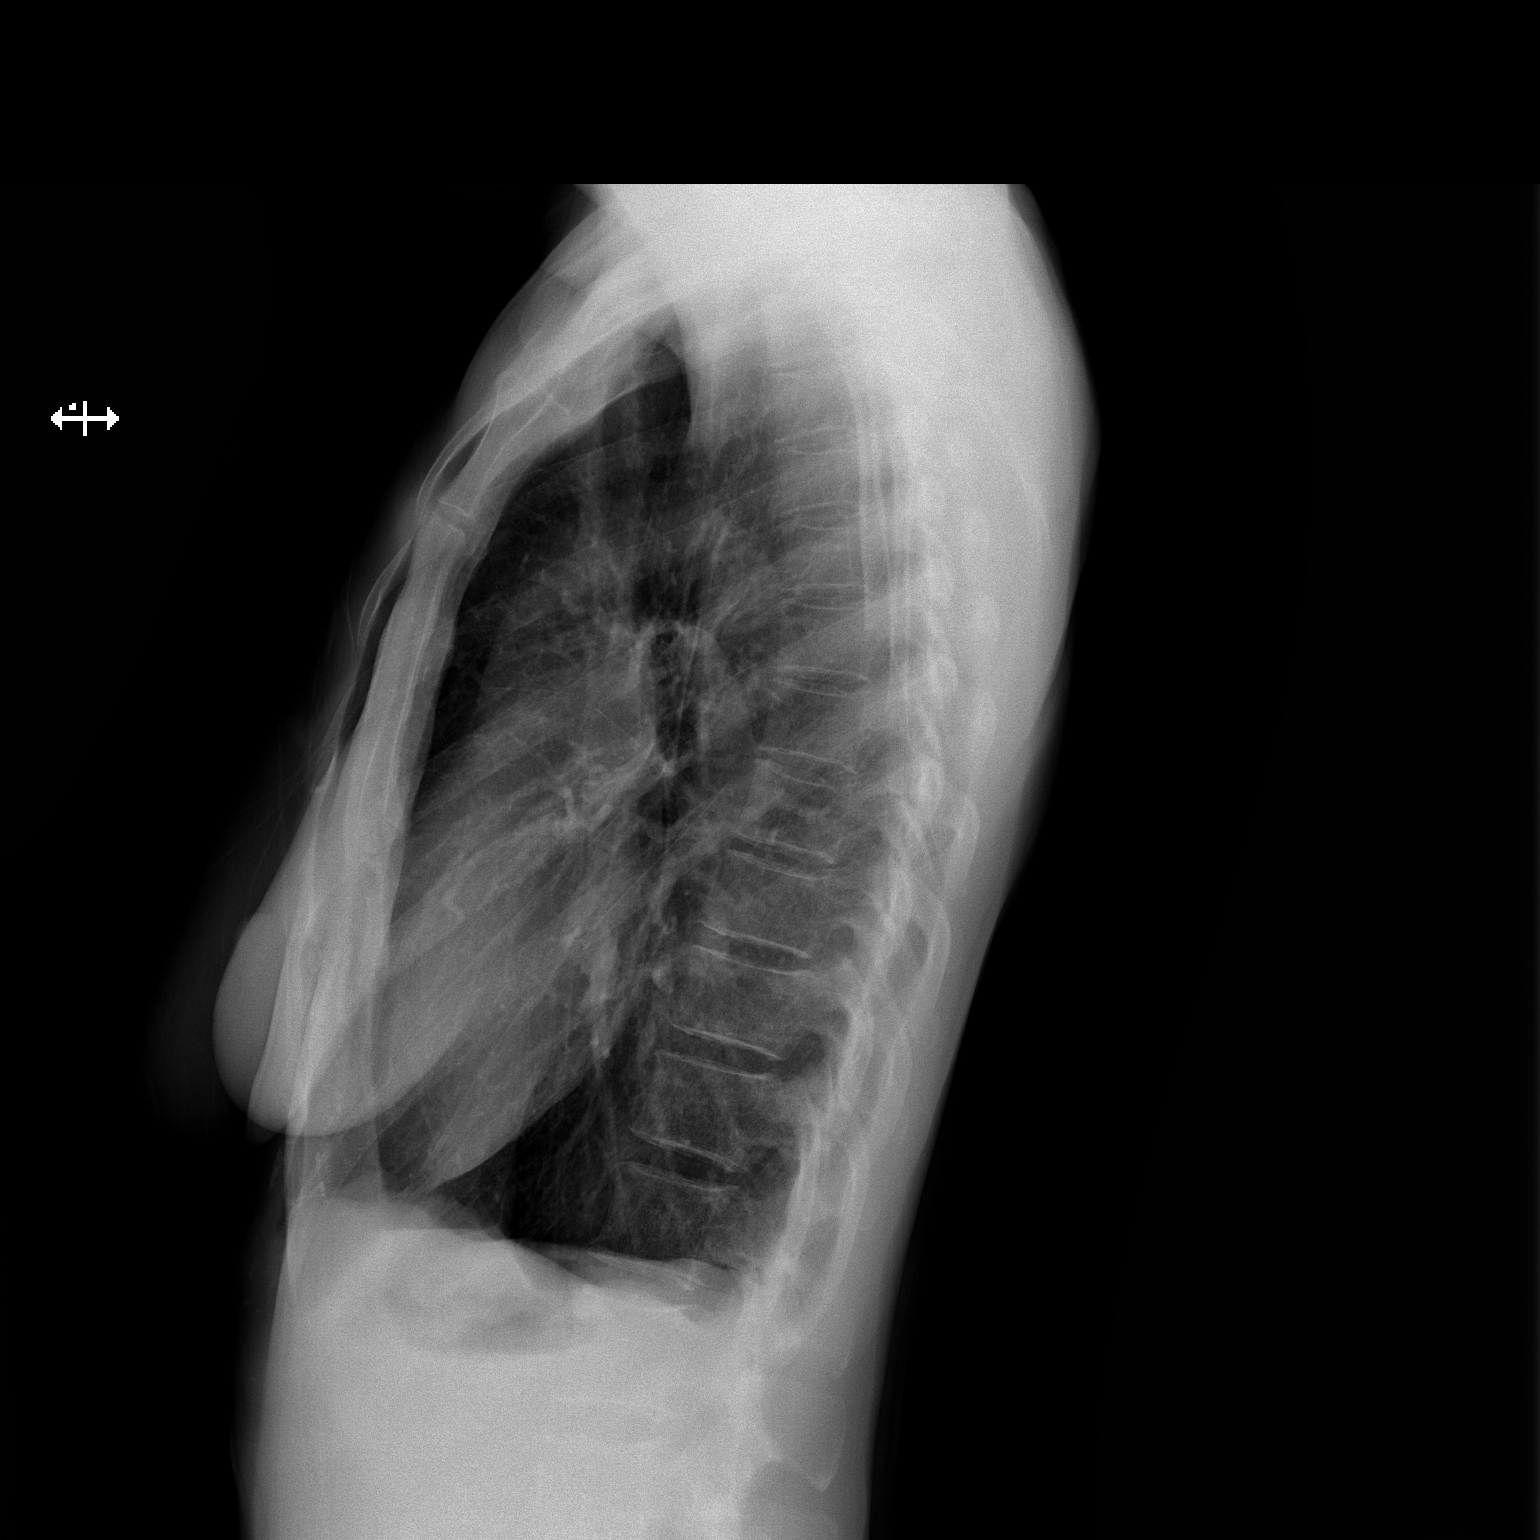

[2 of 2 positions shown; findings below may reference images not displayed]

FINDINGS: There is hyperinflation of the lungs compatible with COPD. Heart and
mediastinal contours are within normal limits. No focal opacities or
effusions. No acute bony abnormality.
IMPRESSION: COPD.  No active disease.

## 2016-10-19 MED ORDER — FOSFOMYCIN TROMETHAMINE 3 G PO PACK
3.0000 g | PACK | Freq: Once | ORAL | Status: AC
  Administered 2016-10-19: 3 g via ORAL
  Filled 2016-10-19: qty 3

## 2016-10-19 NOTE — ED Provider Notes (Signed)
[facility] DEPT Provider Note   CSN: [id number] Arrival date & time: 10/19/16  1511     History   Chief Complaint No chief complaint on file.   HPI Diane Rosario is a 41 y.o. female with a history of bipolar 1 who presents today with painful lumps under her left arm.  She has never had any of these before, states that she first noticed them approximately 10-14 days ago and they have been gradually worsening since.  She denies fevers or chills, states she thinks she is going through menopause as she keeps waking up at night "in a puddle of sweat" and is having hot flashes.  She denies any weight loss, however says that she "eats all the time" and is not gaining weight.  She denies any wounds, no IV drug use, no wounds, rashes.    HPI  Past Medical History:  Diagnosis Date  . Bipolar 1 disorder ([place])    pt reports bipolar, pt reports Auditory hallucinations  . Depression   . PTSD (post-traumatic stress disorder)    husband was murdered    Patient Active Problem List   Diagnosis Date Noted  . Posttraumatic stress disorder 09/06/2012  . Major depressive disorder, recurrent episode, severe, specified as with psychotic behavior 09/06/2012  . Cannabis abuse 09/06/2012  . Cocaine abuse 09/06/2012    Past Surgical History:  Procedure Laterality Date  . EYE SURGERY      OB History    No data available       Home Medications    Prior to Admission medications   Medication Sig Start Date End Date Taking? Authorizing Provider  haloperidol (HALDOL) 10 MG tablet Take 10 mg by mouth at bedtime.  05/08/14  Yes [provider]  risperiDONE microspheres (RISPERDAL CONSTA) 50 MG injection Inject 50 mg into the muscle every 14 (fourteen) days.   Yes [provider]  naproxen (NAPROSYN) 500 MG tablet Take 1 tablet (500 mg total) by mouth 2 (two) times daily with a meal. Patient not taking: Reported on 10/19/2016 06/12/14   Waynetta Pean, PA-C    Family  History Family History  Problem Relation Age of Onset  . Hypertension Maternal Aunt   . Hypertension Maternal Grandmother     Social History Social History  Substance Use Topics  . Smoking status: Current Every Day Smoker    Packs/day: 0.25    Years: 10.00    Types: Cigarettes  . Smokeless tobacco: Never Used  . Alcohol use Yes     Comment: occasionally     Allergies   Asa [aspirin]   Review of Systems Review of Systems  Constitutional: Positive for chills, diaphoresis and fatigue. Negative for fever and unexpected weight change.  HENT: Negative for ear pain, postnasal drip and sore throat.   Eyes: Negative for pain and visual disturbance.  Respiratory: Negative for cough and shortness of breath.   Cardiovascular: Negative for chest pain and palpitations.  Gastrointestinal: Negative for abdominal pain and vomiting.  Genitourinary: Negative for dysuria and hematuria.  Musculoskeletal: Negative for arthralgias and back pain.  Skin: Negative for color change, rash and wound.       "bumps" in left underarm area  Allergic/Immunologic: Negative for immunocompromised state.  Neurological: Negative for seizures, syncope, light-headedness, numbness and headaches.  All other systems reviewed and are negative.    Physical Exam Updated Vital Signs BP 105/83   Pulse 78   Temp 98.4 F (36.9 C) (Oral)   Resp 16  Ht 5\' 6"  (1.676 m)   Wt 43.1 kg (95 lb)   SpO2 98%   BMI 15.33 kg/m   Physical Exam  Constitutional: She appears well-developed and well-nourished. No distress.  HENT:  Head: Normocephalic and atraumatic.  Eyes: Conjunctivae are normal.  Neck: Neck supple.  Cardiovascular: Normal rate and regular rhythm.   No murmur heard. Pulmonary/Chest: Effort normal and breath sounds normal. No respiratory distress. Right breast exhibits no inverted nipple, no mass, no nipple discharge and no skin change. Left breast exhibits mass. Left breast exhibits no inverted nipple,  no nipple discharge and no skin change.    Abdominal: Soft. Normal appearance and bowel sounds are normal. There is no hepatosplenomegaly. There is no tenderness. There is no rigidity, no rebound, no guarding and no CVA tenderness.  Musculoskeletal: She exhibits no edema.  Lymphadenopathy:       Head (right side): No submandibular and no tonsillar adenopathy present.       Head (left side): No submandibular and no tonsillar adenopathy present.    She has cervical adenopathy.    She has axillary adenopathy.       Right: Inguinal and supraclavicular adenopathy present. No epitrochlear adenopathy present.       Left: Inguinal, supraclavicular and epitrochlear adenopathy present.  Obvious lymph node swelling along superficial left lateral breast.  These lymph nodes are tender to palpation.  They are firm, discrete but not freely mobile.  Palpation increases her pain.  There is no obvious erythema to the area.    Neurological: She is alert.  Skin: Skin is warm, dry and intact.  Skin on the left upper extremity, trunk inspected, and no obvious sources of infection or inflammation.  No abscesses, wounds, rashes or bruises.  Psychiatric: She has a normal mood and affect. Her behavior is normal.  Nursing note and vitals reviewed.    ED Treatments / Results  Labs (all labs ordered are listed, but only abnormal results are displayed) Labs Reviewed  URINALYSIS, ROUTINE W REFLEX MICROSCOPIC - Abnormal; Notable for the following:       Result Value   APPearance HAZY (*)    Nitrite POSITIVE (*)    Leukocytes, UA TRACE (*)    Bacteria, UA RARE (*)    Squamous Epithelial / LPF 0-5 (*)    All other components within normal limits  PREGNANCY, URINE  CBC WITH DIFFERENTIAL/PLATELET  COMPREHENSIVE METABOLIC PANEL  RAPID HIV SCREEN (HIV 1/2 AB+AG)    EKG  EKG Interpretation None       Radiology Dg Chest 2 View  Result Date: 10/19/2016 CLINICAL DATA:  Shortness of breath, chest pain EXAM:  CHEST  2 VIEW COMPARISON:  09/05/2012 FINDINGS: There is hyperinflation of the lungs compatible with COPD. Heart and mediastinal contours are within normal limits. No focal opacities or effusions. No acute bony abnormality. IMPRESSION: COPD.  No active disease. Electronically Signed   By: Rolm Baptise M.D.   On: 10/19/2016 18:23    Procedures Procedures (including critical care time)  Medications Ordered in ED Medications  fosfomycin (MONUROL) packet 3 g (3 g Oral Given 10/19/16 1959)     Initial Impression / Assessment and Plan / ED Course  I have reviewed the triage vital signs and the nursing notes.  Pertinent labs & imaging results that were available during my care of the patient were reviewed by me and considered in my medical decision making (see chart for details).    Emelia Loron presents with bumps  on her lateral chest wall, just inferior to her axilla consistent with lymphadenopathy.  Patient has night sweats, chills, and fatigue.  Physical exam showed she has nearly body wide lymphadenopathy in addition to the multiple 1.5+ cm hard, tender lymphadenopathy on left lateral chest wall consistent with pectoral/axillary lymph nodes.  Physical exam showed a left breast mass that was non tender, non painful. Labs, CXR were obtained and reviewed.  Patient with normal CBC, CMP, non reactive HIV, and negative pregnancy test.   Patient was informed of incidental finding of CXR consistent with COPD.    Based on newly diagnosed breast mass patient is to follow up with the breast center in [place].  As mass is not tender I have a low suspicion for an infectious cause of her mass.  Patient given information on obtaining a PCP and instructed to follow up with them regarding her nearly body wide LAD.   UA consistent with UTI.  I do not feel that this is related to her other symptoms today and represents a incidental finding.  Patient des not have evidence of pyelonephritis.  Patient will be  treated with fosfomycin here for this today.     At this time there does not appear to be any evidence of an acute emergency medical condition and the patient appears stable for discharge with appropriate outpatient follow up.Diagnosis was discussed with patient who verbalizes understanding and is agreeable to discharge. Pt case discussed with Dr. Billy Fischer who agrees with my plan.     Final Clinical Impressions(s) / ED Diagnoses   Final diagnoses:  Lymphadenopathy, generalized  Left breast mass  Lymphadenopathy, axillary  Acute cystitis with hematuria    New Prescriptions Discharge Medication List as of 10/19/2016  7:39 PM       Lorin Glass, PA-C 10/20/16 1944    Gareth Morgan, MD 10/23/16 2039

## 2016-10-19 NOTE — ED Triage Notes (Signed)
Pt c/o painful "lumps" to left axilla x 1.5 weeks, progressive in size.  4 cm diameter cluster of multiple distinct, firm, mobile, tender small nodules anterior to left axilla. No redness or induration. No recent infection or injury to axilla. No other masses.

## 2016-10-19 NOTE — Discharge Instructions (Signed)
Obtain a primary care provider for follow up.    Please call the breast center tomorrow first thing in the morning for an appointment.  Tell them you were seen in the emergency room and diagnosed with a left breast mass and lymphadenopathy.    Today you were negative for HIV, your blood work looked good.   You have been treated for a urinary tract infection today.

## 2016-10-19 NOTE — ED Notes (Signed)
Patient transported to X-ray 

## 2016-10-19 NOTE — Care Management Note (Signed)
Case Management Note  Patient Details  Name: Diane Rosario MRN: [id number] Date of Birth: June 27, 1975  Subjective/Objective:                  Left axillary lump(s)  Action/Plan: ED CM spoke with the patient at the bedside. Patient needs a PCP. Provided with a flyer for [facility] [employer]. Encouraged the patient to call tomorrow to request an appointment. She agrees and states [place] is closest to her home. She lives with a roommate. Reports no issues related to obtaining or taking her medications as prescribed. She has a bus pass. Reports he boyfriend takes her to appointments also. She reports no issues with mobility. She denies additional needs at this time.    Expected Discharge Date:                  Expected Discharge Plan:     In-House Referral:     Discharge planning Services  CM Consult, Other - See comment (PCP)  Post Acute Care Choice:    Choice offered to:     DME Arranged:    DME Agency:     HH Arranged:    HH Agency:     Status of Service:  Completed, signed off  If discussed at [place] of Stay Meetings, dates discussed:    Additional Comments:  Apolonio Schneiders, RN 10/19/2016, 6:33 PM

## 2016-10-25 ENCOUNTER — Other Ambulatory Visit: Payer: Self-pay | Admitting: Nurse Practitioner

## 2016-10-25 DIAGNOSIS — N6320 Unspecified lump in the left breast, unspecified quadrant: Secondary | ICD-10-CM

## 2016-10-27 ENCOUNTER — Ambulatory Visit
Admission: RE | Admit: 2016-10-27 | Discharge: 2016-10-27 | Disposition: A | Discharge status: Home / Self Care | Payer: Medicaid Other | Source: Ambulatory Visit | Attending: Nurse Practitioner | Admitting: Nurse Practitioner

## 2016-10-27 ENCOUNTER — Other Ambulatory Visit: Payer: Self-pay | Admitting: Nurse Practitioner

## 2016-10-27 DIAGNOSIS — N6320 Unspecified lump in the left breast, unspecified quadrant: Secondary | ICD-10-CM

## 2016-10-27 DIAGNOSIS — R921 Mammographic calcification found on diagnostic imaging of breast: Secondary | ICD-10-CM

## 2016-10-27 DIAGNOSIS — R599 Enlarged lymph nodes, unspecified: Secondary | ICD-10-CM

## 2016-10-27 IMAGING — MG 2D DIGITAL DIAGNOSTIC BILATERAL MAMMOGRAM WITH CAD AND ADJUNCT T
8 of 17 series · 8 of 40 positions shown · non-contrast
Comparison: Previous exam(s).

CLINICAL DATA: Left upper outer quadrant area of palpable concern.

EXAM:
2D DIGITAL DIAGNOSTIC BILATERAL MAMMOGRAM WITH CAD AND ADJUNCT TOMO
ULTRASOUND LEFT BREAST

[R CC]
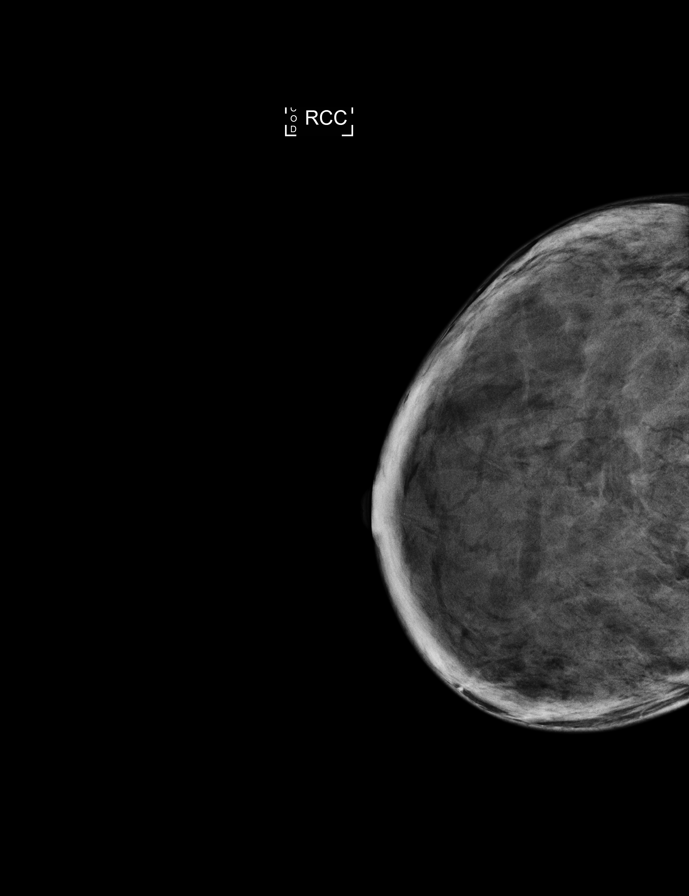

[L CC synth-2D]
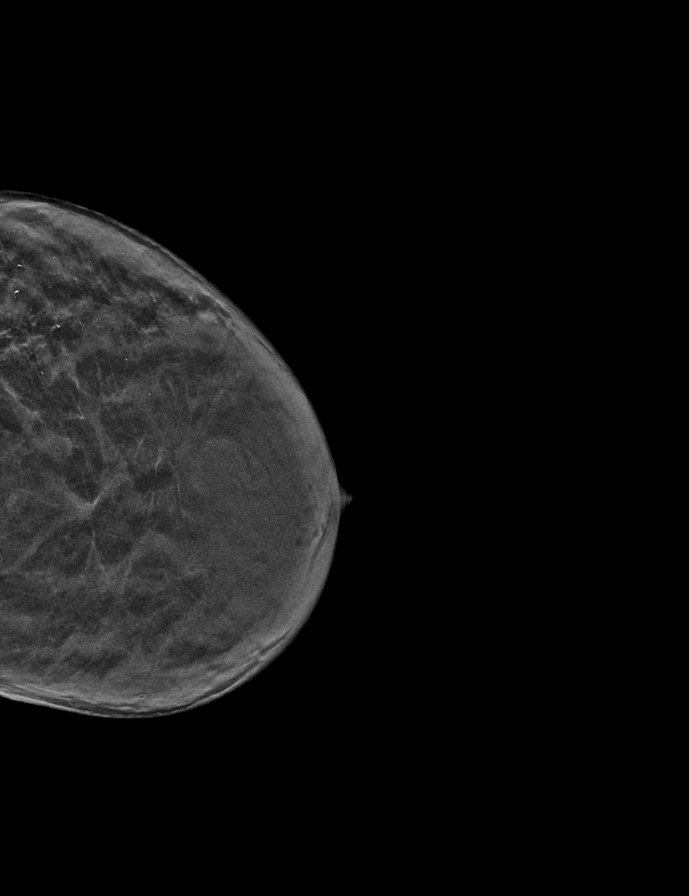

[L CC]
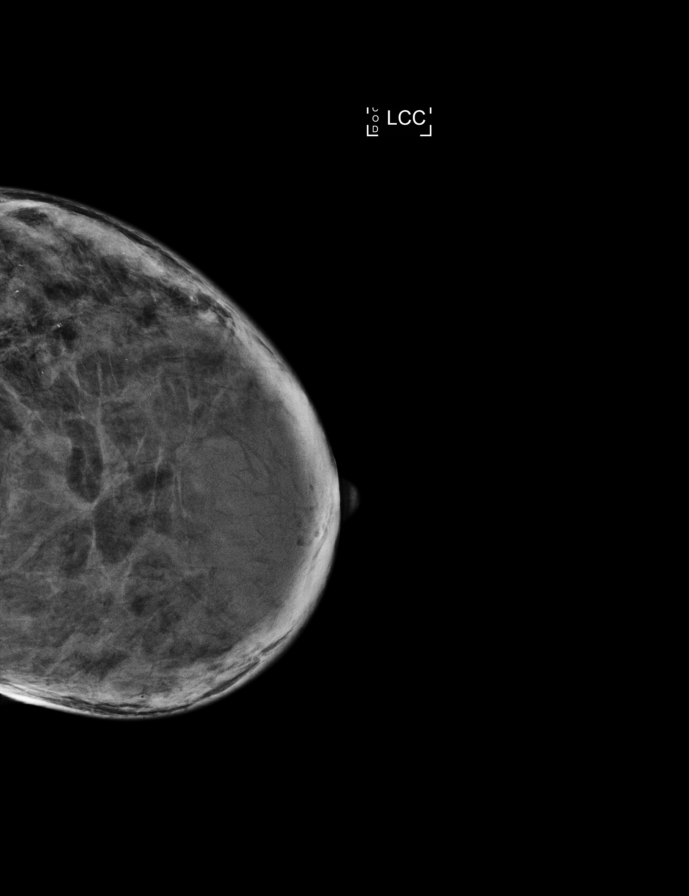

[L TAN]
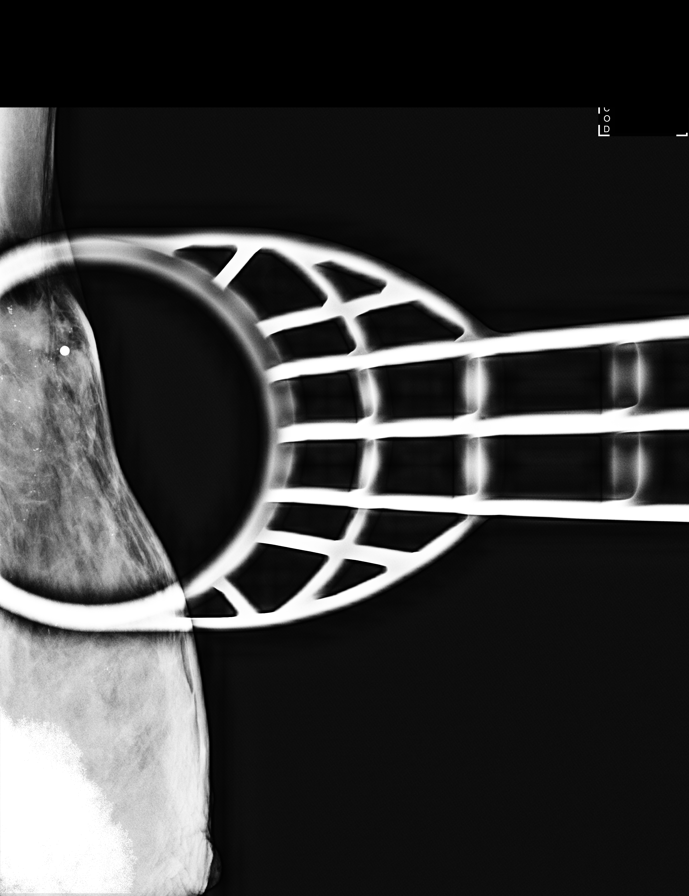

[L MLO synth-2D]
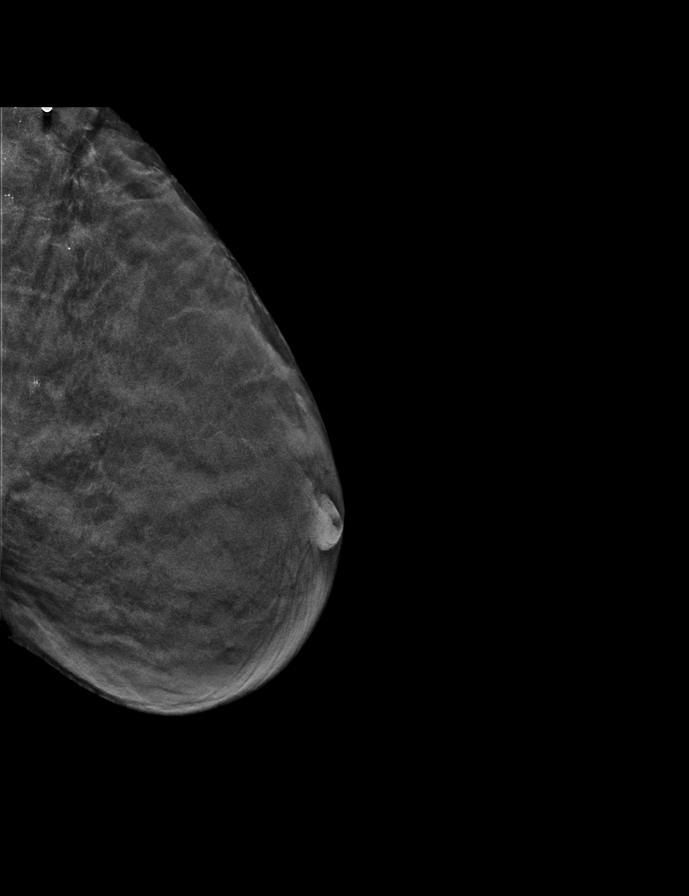

[R CC synth-2D]
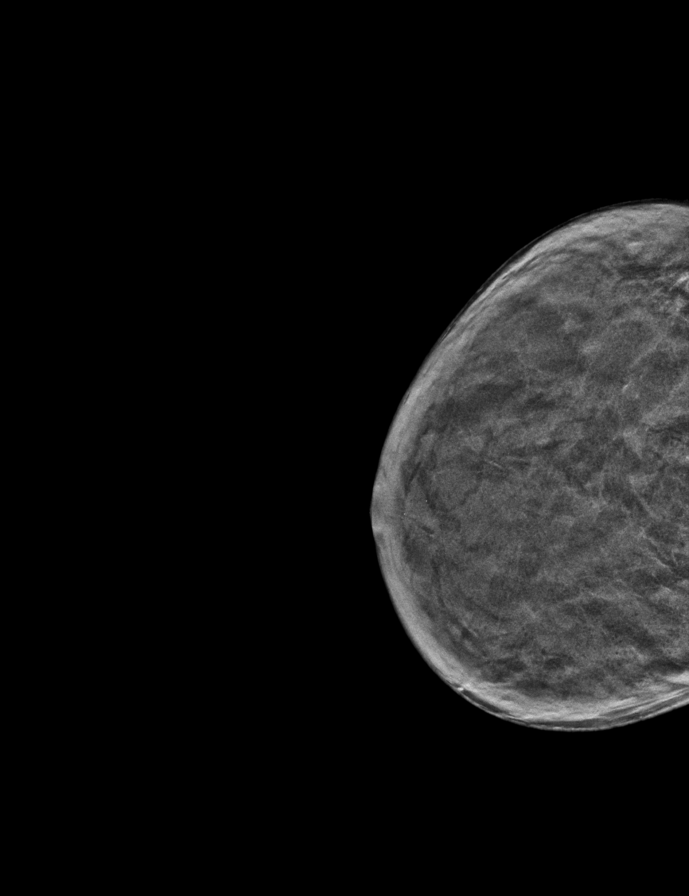

[L MLO]
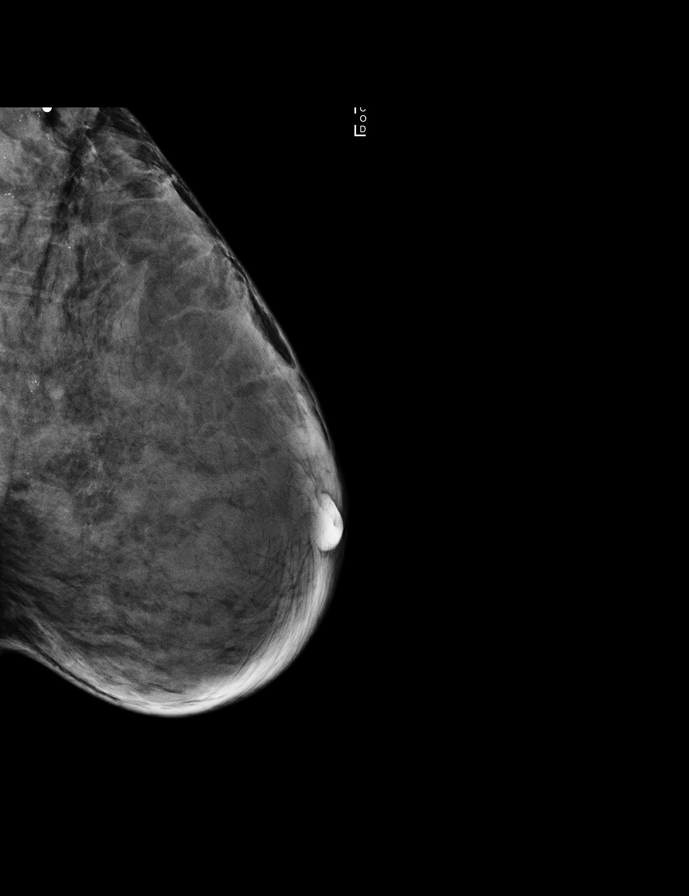

[L TAN synth-2D]
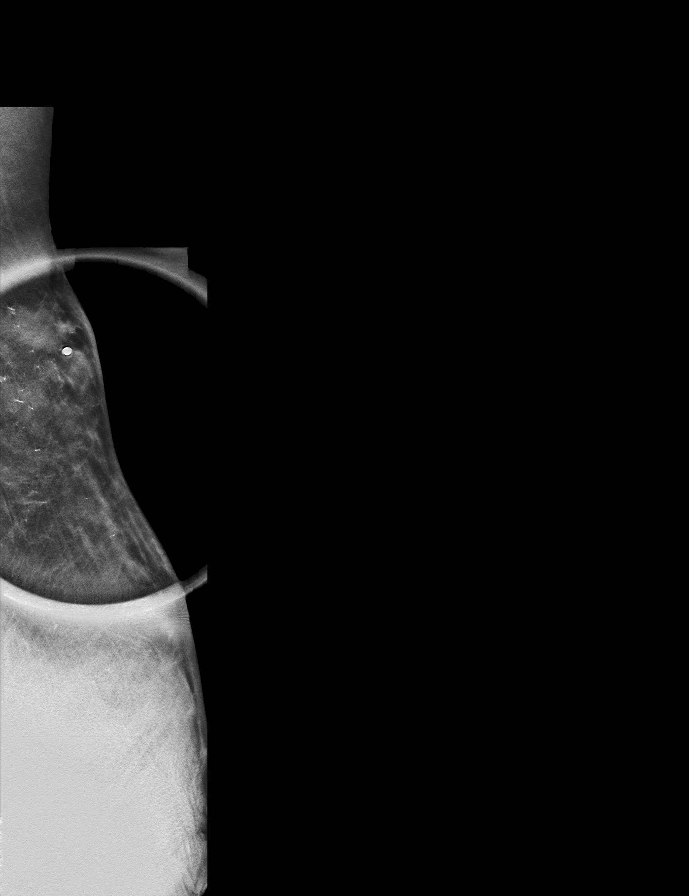

[8 of 40 positions shown; findings below may reference images not displayed]

ACR Breast Density Category d: The breast tissue is extremely dense,
which lowers the sensitivity of mammography.
FINDINGS: There is a spiculated mass in the superior left breast, far
posterior depth, seen on the MLO view only due to far posterior
location. This finding corresponds to the area of palpable concern
in the left breast upper outer quadrant. Few mm clusters of
calcifications are seen throughout the posterior portion of the left
breast upper outer quadrant. The calcifications demonstrate
indeterminate appearance. The total tissue involved measures 5.1 by
9.0 by 3.5 cm. A a group in the more inferior portion of the left
breast upper outer quadrant, middle depth, is marked and may be
considered for biopsy to establish histologic diagnosis.

There is no mammographic evidence of malignancy in the right breast.

Mammographic images were processed with CAD.

On physical exam, there is a firm fixed irregular mass measuring
approximately 5 cm in the left 1 o'clock breast, far posterior
depth.

Targeted ultrasound is performed, showing irregular hypoechoic mass
containing calcifications in the left breast 1 o'clock 10 cm from
the nipple measuring 4.8 x 0.8 x 3.1 cm. There are at least 4
abnormal lymph nodes in the left axilla, 2 of which demonstrate near
complete hilar effacement.
IMPRESSION: Highly suspicious palpable 4.8 cm left breast upper outer quadrant
mass, with probably malignant left axillary lymphadenopathy.

Scattered groups of indeterminate calcifications in the left breast
upper outer quadrant. A group farthest from the index mass is marked
for stereotactic biopsy.

No mammographic evidence of malignancy in the right breast.

RECOMMENDATION:
Ultrasound-guided core needle biopsy of left breast 1 o'clock mass
and 1 of the abnormal left axillary lymph nodes.

Stereotactic core needle biopsy of calcifications in the left breast
upper outer quadrant. This may be technically challenging due to
patient's breast thickness, which was discussed with the patient at
the time of the ultrasound exam.

I have discussed the findings and recommendations with the patient.
Results were also provided in writing at the conclusion of the
visit. If applicable, a reminder letter will be sent to the patient
regarding the next appointment.

BI-RADS CATEGORY  5: Highly suggestive of malignancy.

## 2016-11-01 ENCOUNTER — Ambulatory Visit
Admission: RE | Admit: 2016-11-01 | Discharge: 2016-11-01 | Disposition: A | Discharge status: Home / Self Care | Payer: Medicaid Other | Source: Ambulatory Visit | Attending: Nurse Practitioner | Admitting: Nurse Practitioner

## 2016-11-01 ENCOUNTER — Other Ambulatory Visit: Payer: Self-pay | Admitting: Nurse Practitioner

## 2016-11-01 DIAGNOSIS — N6320 Unspecified lump in the left breast, unspecified quadrant: Secondary | ICD-10-CM

## 2016-11-01 DIAGNOSIS — R599 Enlarged lymph nodes, unspecified: Secondary | ICD-10-CM

## 2016-11-01 DIAGNOSIS — R921 Mammographic calcification found on diagnostic imaging of breast: Secondary | ICD-10-CM

## 2016-11-03 ENCOUNTER — Encounter: Payer: Self-pay | Admitting: *Deleted

## 2016-11-03 ENCOUNTER — Telehealth: Payer: Self-pay | Admitting: *Deleted

## 2016-11-03 DIAGNOSIS — Z170 Estrogen receptor positive status [ER+]: Principal | ICD-10-CM

## 2016-11-03 DIAGNOSIS — C50412 Malignant neoplasm of upper-outer quadrant of left female breast: Secondary | ICD-10-CM | POA: Insufficient documentation

## 2016-11-03 HISTORY — DX: Malignant neoplasm of upper-outer quadrant of left female breast: C50.412

## 2016-11-03 NOTE — Telephone Encounter (Signed)
Confirmed [facility] for 11/09/16 at 1215 .  Instructions and contact information given.

## 2016-11-05 ENCOUNTER — Encounter: Payer: Self-pay | Admitting: General Surgery

## 2016-11-09 ENCOUNTER — Encounter: Payer: Self-pay | Admitting: General Practice

## 2016-11-09 ENCOUNTER — Ambulatory Visit: Payer: Medicaid Other | Attending: General Surgery | Admitting: Physical Therapy

## 2016-11-09 ENCOUNTER — Ambulatory Visit (HOSPITAL_BASED_OUTPATIENT_CLINIC_OR_DEPARTMENT_OTHER): Payer: Medicaid Other | Admitting: Hematology and Oncology

## 2016-11-09 ENCOUNTER — Ambulatory Visit
Admission: RE | Admit: 2016-11-09 | Discharge: 2016-11-09 | Disposition: A | Discharge status: Home / Self Care | Payer: Medicaid Other | Source: Ambulatory Visit | Attending: Radiation Oncology | Admitting: Radiation Oncology

## 2016-11-09 ENCOUNTER — Encounter: Payer: Self-pay | Admitting: Hematology and Oncology

## 2016-11-09 ENCOUNTER — Encounter: Payer: Self-pay | Admitting: Physical Therapy

## 2016-11-09 ENCOUNTER — Other Ambulatory Visit (HOSPITAL_BASED_OUTPATIENT_CLINIC_OR_DEPARTMENT_OTHER): Payer: Medicaid Other

## 2016-11-09 ENCOUNTER — Other Ambulatory Visit: Payer: Self-pay | Admitting: *Deleted

## 2016-11-09 VITALS — BP 101/67 | HR 98 | Temp 98.2°F | Resp 17 | Ht 66.0 in | Wt 91.3 lb

## 2016-11-09 DIAGNOSIS — Z170 Estrogen receptor positive status [ER+]: Secondary | ICD-10-CM | POA: Diagnosis present

## 2016-11-09 DIAGNOSIS — C50412 Malignant neoplasm of upper-outer quadrant of left female breast: Secondary | ICD-10-CM

## 2016-11-09 DIAGNOSIS — F319 Bipolar disorder, unspecified: Secondary | ICD-10-CM

## 2016-11-09 DIAGNOSIS — R293 Abnormal posture: Secondary | ICD-10-CM | POA: Insufficient documentation

## 2016-11-09 DIAGNOSIS — C773 Secondary and unspecified malignant neoplasm of axilla and upper limb lymph nodes: Secondary | ICD-10-CM

## 2016-11-09 DIAGNOSIS — Z720 Tobacco use: Secondary | ICD-10-CM | POA: Diagnosis not present

## 2016-11-09 LAB — COMPREHENSIVE METABOLIC PANEL
ALT: 11 U/L (ref 0–55)
AST: 26 U/L (ref 5–34)
Albumin: 3.6 g/dL (ref 3.5–5.0)
Alkaline Phosphatase: 74 U/L (ref 40–150)
Anion Gap: 7 mEq/L (ref 3–11)
BUN: 13.8 mg/dL (ref 7.0–26.0)
CO2: 31 mEq/L — ABNORMAL HIGH (ref 22–29)
Calcium: 10 mg/dL (ref 8.4–10.4)
Chloride: 103 mEq/L (ref 98–109)
Creatinine: 1.1 mg/dL (ref 0.6–1.1)
EGFR: 75 mL/min/{1.73_m2} — ABNORMAL LOW (ref >90)
Glucose: 137 mg/dl (ref 70–140)
Potassium: 3.7 mEq/L (ref 3.5–5.1)
Sodium: 141 mEq/L (ref 136–145)
Total Bilirubin: 0.28 mg/dL (ref 0.20–1.20)
Total Protein: 7.4 g/dL (ref 6.4–8.3)

## 2016-11-09 LAB — CBC WITH DIFFERENTIAL/PLATELET
BASO%: 1.1 % (ref 0.0–2.0)
Basophils Absolute: 0.1 10*3/uL (ref 0.0–0.1)
EOS%: 2.3 % (ref 0.0–7.0)
Eosinophils Absolute: 0.2 10*3/uL (ref 0.0–0.5)
HCT: 38.6 % (ref 34.8–46.6)
HGB: 12.7 g/dL (ref 11.6–15.9)
LYMPH%: 36.5 % (ref 14.0–49.7)
MCH: 32.8 pg (ref 25.1–34.0)
MCHC: 33 g/dL (ref 31.5–36.0)
MCV: 99.3 fL (ref 79.5–101.0)
MONO#: 0.5 10*3/uL (ref 0.1–0.9)
MONO%: 6.8 % (ref 0.0–14.0)
NEUT#: 3.7 10*3/uL (ref 1.5–6.5)
NEUT%: 53.3 % (ref 38.4–76.8)
Platelets: 285 10*3/uL (ref 145–400)
RBC: 3.88 10*6/uL (ref 3.70–5.45)
RDW: 13.2 % (ref 11.2–14.5)
WBC: 6.9 10*3/uL (ref 3.9–10.3)
lymph#: 2.5 10*3/uL (ref 0.9–3.3)

## 2016-11-09 NOTE — Patient Instructions (Signed)

## 2016-11-09 NOTE — Therapy (Signed)
[place], [place], [address] Phone: [phone]   Fax:  [phone]  Physical Therapy Evaluation  Patient Details  Name: Diane Rosario MRN: [id number] Date of Birth: 02-Jul-1975 Referring Provider: Dr. [name]  Encounter Date: 11/09/2016      PT End of Session - 11/09/16 1525    Visit Number 1   Number of Visits 1   PT Start Time 1345   PT Stop Time 1415   PT Time Calculation (min) 30 min   Activity Tolerance Patient tolerated treatment well   Behavior During Therapy [facility] for tasks assessed/performed      Past Medical History:  Diagnosis Date  . Anxiety   . Bipolar 1 disorder ([place])    pt reports bipolar, pt reports Auditory hallucinations  . Depression   . Malignant neoplasm of upper-outer quadrant of left breast in female, estrogen receptor positive ([place]) 11/03/2016  . PTSD (post-traumatic stress disorder)    husband was murdered    Past Surgical History:  Procedure Laterality Date  . EYE SURGERY      There were no vitals filed for this visit.       Subjective Assessment - 11/09/16 1421    Subjective Patient reports she is here today to be seen by her medical team for her newly diagnosed left breast cancer.   Patient is accompained by: Family member   Pertinent History Patient was diagnosed on 10/27/16 with left invasive ductal carcinoma breast cancer. It measures 4.8 cm and is located in the upper outer quadrant. It is ER positive, PR negative, and HER2 positive with a Ki67 of 20%. She has 2 biopsied known positive nodes. She has a history of bipolar disorder, PTSD, and extensive psychiatric history. She reported she continues to use illegal drugs. During her assessment today, she appears very groggy likely due to medication.   Patient Stated Goals Reduce lymphedema risk and learn post op shoulder ROM HEP   Currently in Pain? No/denies            [facility] PT Assessment -  11/09/16 0001      Assessment   Medical Diagnosis Left breast cancer   Referring Provider Dr. [name]   Onset Date/Surgical Date 10/27/16   Hand Dominance Right   Prior Therapy none     Precautions   Precautions Other (comment)   Precaution Comments active cancer     Restrictions   Weight Bearing Restrictions No     Balance Screen   Has the patient fallen in the past 6 months No   Has the patient had a decrease in activity level because of a fear of falling?  No   Is the patient reluctant to leave their home because of a fear of falling?  No     Home Environment   Living Environment Unsure   Additional Comments Reports that she lives with a roommate     Prior Function   Level of Independence Independent   Vocation Unemployed   Leisure She does not exercise     Cognition   Overall Cognitive Status Difficult to assess   Difficult to assess due to Level of arousal  Very groggy during evaluation     Posture/Postural Control   Posture/Postural Control Postural limitations   Postural Limitations Rounded Shoulders;Forward head     ROM / Strength   AROM / PROM / Strength AROM;Strength     AROM   AROM Assessment Site Shoulder;Cervical  Right/Left Shoulder Right;Left   Right Shoulder Extension 63 Degrees   Right Shoulder Flexion 160 Degrees   Right Shoulder ABduction 162 Degrees   Right Shoulder Internal Rotation 60 Degrees   Right Shoulder External Rotation 90 Degrees   Left Shoulder Extension 63 Degrees   Left Shoulder Flexion 152 Degrees   Left Shoulder ABduction 174 Degrees   Left Shoulder Internal Rotation 59 Degrees   Left Shoulder External Rotation 79 Degrees   Cervical Flexion WNL   Cervical Extension WNL   Cervical - Right Side Bend WNL   Cervical - Left Side Bend WNL   Cervical - Right Rotation WNL   Cervical - Left Rotation WNL     Strength   Overall Strength Within functional limits for tasks performed           LYMPHEDEMA/ONCOLOGY  QUESTIONNAIRE - 11/09/16 1521      Type   Cancer Type Left breast cancer     Lymphedema Assessments   Lymphedema Assessments Upper extremities     Right Upper Extremity Lymphedema   10 cm Proximal to Olecranon Process 20.6 cm   Olecranon Process 20.6 cm   10 cm Proximal to Ulnar Styloid Process 17.9 cm   Just Proximal to Ulnar Styloid Process 14.2 cm   Across Hand at [employer] 17.6 cm   At [place] of 2nd Digit 5.8 cm     Left Upper Extremity Lymphedema   10 cm Proximal to Olecranon Process 20.2 cm   Olecranon Process 20.7 cm   10 cm Proximal to Ulnar Styloid Process 17.3 cm   Just Proximal to Ulnar Styloid Process 14.4 cm   Across Hand at [employer] 17.7 cm   At [place] of 2nd Digit 5.5 cm         Objective measurements completed on examination: See above findings.    Patient was instructed today in a home exercise program today for post op shoulder range of motion. These included active assist shoulder flexion in sitting, scapular retraction, wall walking with shoulder abduction, and hands behind head external rotation.  She was encouraged to do these twice a day, holding 3 seconds and repeating 5 times when permitted by her physician.         PT Education - 11/09/16 1522    Education provided Yes   Education Details Lymphedema risk reduction and post op shoulder ROM HEP   Person(s) Educated Patient;Caregiver(s)  Uncle   Methods Explanation;Demonstration;Handout   Comprehension Returned demonstration;Verbalized understanding              Breast Clinic Goals - 11/09/16 1531      Patient will be able to verbalize understanding of pertinent lymphedema risk reduction practices relevant to her diagnosis specifically related to skin care.   Baseline No knowledge   Time 1   Period Days   Status Achieved     Patient will be able to return demonstrate and/or verbalize understanding of the post-op home exercise program related to regaining shoulder range of  motion.   Baseline No knowledge   Time 1   Period Days   Status Achieved     Patient will be able to verbalize understanding of the importance of attending the postoperative After Breast Cancer Class for further lymphedema risk reduction education and therapeutic exercise.   Baseline No knowledge   Time 1   Period Days   Status Achieved               Plan -  11/09/16 1526    Clinical Impression Statement Patient was diagnosed on 10/27/16 with left invasive ductal carcinoma breast cancer. It measures 4.8 cm and is located in the upper outer quadrant. It is ER positive, PR negative, and HER2 positive with a Ki67 of 20%. She has 2 biopsied known positive nodes. She has a history of bipolar disorder, PTSD, and extensive psychiatric history. She reported she continues to use illegal drugs. During her assessment today, she appears very groggy likely due to medication. Her multidisciplinary medical team met prior to her assessments to determine a recommended treatment plan. She is planning to have a left mastectomy and axillary node dissection followed by radiation and anti-estrogen therapy. She will benefit from post op PT to regain shoulder ROM and reduce lymphedema risk.   History and Personal Factors relevant to plan of care: Appears to have lack of family support as her uncle who is with her reports he will be unable to be present for appointments and she lives with a roommate. Current drug use and mental illness.   Clinical Presentation Evolving   Clinical Presentation due to: Unknown extent of disease; scheduled to have a breast MRI and staging scans.   Clinical Decision Making Moderate   Rehab Potential Good   Clinical Impairments Affecting Rehab Potential Above stated factors   PT Frequency One time visit   PT Treatment/Interventions Patient/family education;Therapeutic exercise   PT Next Visit Plan Will f/u after surgery to determine PT needs   PT Home Exercise Plan Post op shoulder  ROM HEP   Consulted and Agree with Plan of Care Patient;Family member/caregiver   Family Member Consulted Uncle      Patient will benefit from skilled therapeutic intervention in order to improve the following deficits and impairments:  Postural dysfunction, Decreased knowledge of precautions, Pain, Impaired UE functional use, Decreased range of motion  Visit Diagnosis: Carcinoma of upper-outer quadrant of left breast in female, estrogen receptor positive ([place]) - Plan: PT plan of care cert/re-cert  Abnormal posture - Plan: PT plan of care cert/re-cert   Patient will follow up at outpatient cancer rehab if needed following surgery.  If the patient requires physical therapy at that time, a specific plan will be dictated and sent to the referring physician for approval. The patient was educated today on appropriate basic range of motion exercises to begin post operatively and the importance of attending the After Breast Cancer class following surgery.  Patient was educated today on lymphedema risk reduction practices as it pertains to recommendations that will benefit the patient immediately following surgery.  She verbalized good understanding.  No additional physical therapy is indicated at this time.     Problem List Patient Active Problem List   Diagnosis Date Noted  . Malignant neoplasm of upper-outer quadrant of left breast in female, estrogen receptor positive ([place]) 11/03/2016  . Posttraumatic stress disorder 09/06/2012  . Major depressive disorder, recurrent episode, severe, specified as with psychotic behavior 09/06/2012  . Cannabis abuse 09/06/2012  . Cocaine abuse 09/06/2012    Annia Friendly, PT 11/09/16 3:36 PM  [place], [place], [address] Phone: [phone]   Fax:  [phone]  Name: Diane Rosario MRN: [id number] Date of Birth: [birth year]

## 2016-11-09 NOTE — Progress Notes (Signed)
Radiation Oncology         [phone] ________________________________  Name: Diane Rosario        MRN: [id number]  Date of Service: 11/09/2016 DOB: 1975-12-14  QP:[id number], [facility]  [name], MD     REFERRING PHYSICIAN: [name], MD   DIAGNOSIS: The encounter diagnosis was Malignant neoplasm of upper-outer quadrant of left breast in female, estrogen receptor positive ([place]).   HISTORY OF PRESENT ILLNESS: Diane Rosario is a 41 y.o. female seen in the multidisciplinary breast conference for a new diagnosis of left  breast cancer. The patient palpated a mass in the left breast and underwent diagnostic mammogram and ultrasound. Calcifications and dense breast tissue was noted. ultrasound revealed a 4.8 x .8 x 3.1 cm mass at 1:00, and a 1.1 x .5 cm axillary node, a separate node measuring 1.4 x .8 cm. Two breast biopsies were performed as well as biopsies of the abnormalities in the axilla and each revealed at least grade 2, per pathology appeared more consistent with grade 3, ER weakly positive at 30%, PR negative, and HER2 amplified with a ratio of 7.53. Her Ki 67 was 20%. She comes today to discuss options of treatment for her cancer.   PREVIOUS RADIATION THERAPY: No   PAST MEDICAL HISTORY:  Past Medical History:  Diagnosis Date  . Anxiety   . Bipolar 1 disorder ([place])    pt reports bipolar, pt reports Auditory hallucinations  . Depression   . Malignant neoplasm of upper-outer quadrant of left breast in female, estrogen receptor positive ([place]) 11/03/2016  . PTSD (post-traumatic stress disorder)    husband was murdered       PAST SURGICAL HISTORY: Past Surgical History:  Procedure Laterality Date  . EYE SURGERY       FAMILY HISTORY:  Family History  Problem Relation Age of Onset  . Hypertension Maternal Aunt   . Hypertension Maternal Grandmother      SOCIAL HISTORY:  reports that she has been smoking Cigarettes.  She has a 2.50 pack-year  smoking history. She has never used smokeless tobacco. She reports that she drinks alcohol. She reports that she uses drugs, including Marijuana and Cocaine. The patient is widowed and lives in [place]. She lives in [place] and is accompanied by her maternal uncle "Harrie Jeans." She has one son who lives in [place] also. She lives with a roommate.  ALLERGIES: Asa [aspirin]   MEDICATIONS:  Current Outpatient Prescriptions  Medication Sig Dispense Refill  . ALPRAZolam (XANAX) 0.5 MG tablet Take 0.5 mg by mouth as needed for anxiety.    . haloperidol (HALDOL) 10 MG tablet Take 10 mg by mouth at bedtime.   1  . risperiDONE microspheres (RISPERDAL CONSTA) 50 MG injection Inject 50 mg into the muscle every 14 (fourteen) days.     No current facility-administered medications for this encounter.      REVIEW OF SYSTEMS: On review of systems, the patient reports that she is doing well overall. She denies any chest pain, shortness of breath, cough, fevers, chills, night sweats, unintended weight changes. She denies any bowel or bladder disturbances, and denies abdominal pain, nausea or vomiting. She denies any new musculoskeletal or joint aches or pains. A complete review of systems is obtained and is otherwise negative.     PHYSICAL EXAM:  Wt Readings from Last 3 Encounters:  11/09/16 91 lb 4.8 oz (41.4 kg)  10/19/16 95 lb (43.1 kg)  10/17/16 82 lb 6 oz (37.4 kg)  Temp Readings from Last 3 Encounters:  11/09/16 98.2 F (36.8 C) (Oral)  10/19/16 98.4 F (36.9 C) (Oral)  10/17/16 98.7 F (37.1 C) (Oral)   BP Readings from Last 3 Encounters:  11/09/16 101/67  10/19/16 105/83  10/17/16 100/73   Pulse Readings from Last 3 Encounters:  11/09/16 98  10/19/16 78  10/17/16 (!) 106    In general this is a cachectic appearing African American female in no acute distress. She is alert and oriented x4 and appropriate throughout the examination. HEENT reveals that the patient is  normocephalic, atraumatic. EOMs are intact. PERRLA. Skin is intact without any evidence of gross lesions. Cardiovascular exam reveals a regular rate and rhythm, no clicks rubs or murmurs are auscultated. Chest is clear to auscultation bilaterally. Lymphatic assessment is performed and does not reveal any adenopathy in the cervical, supraclavicular,  or inguinal chains. Bilateral breast exam reveals fullness along the left axilla at the lateral aspect of the breast consistent with her axillary node sampling sites. There is fullness in the upper outer quadrant of the left breast. No nipple bleeding or discharge is noted.  Abdomen has active bowel sounds in all quadrants and is intact. The abdomen is soft, non tender, non distended. Lower extremities are negative for pretibial pitting edema, deep calf tenderness, cyanosis or clubbing.   ECOG = 1  0 - Asymptomatic (Fully active, able to carry on all predisease activities without restriction)  1 - Symptomatic but completely ambulatory (Restricted in physically strenuous activity but ambulatory and able to carry out work of a light or sedentary nature. For example, light housework, office work)  2 - Symptomatic, <50% in bed during the day (Ambulatory and capable of all self care but unable to carry out any work activities. Up and about more than 50% of waking hours)  3 - Symptomatic, >50% in bed, but not bedbound (Capable of only limited self-care, confined to bed or chair 50% or more of waking hours)  4 - Bedbound (Completely disabled. Cannot carry on any self-care. Totally confined to bed or chair)  5 - Death   Eustace Pen MM, Creech RH, Tormey DC, et al. [phone]). "Toxicity and response criteria of the [facility] Group". [place] Oncol. 5 (6): 649-55    LABORATORY DATA:  Lab Results  Component Value Date   WBC 6.9 11/09/2016   HGB 12.7 11/09/2016   HCT 38.6 11/09/2016   MCV 99.3 11/09/2016   PLT 285 11/09/2016   Lab Results    Component Value Date   NA 141 11/09/2016   K 3.7 11/09/2016   CL 106 10/19/2016   CO2 31 (H) 11/09/2016   Lab Results  Component Value Date   ALT 11 11/09/2016   AST 26 11/09/2016   ALKPHOS 74 11/09/2016   BILITOT 0.28 11/09/2016      RADIOGRAPHY: Dg Chest 2 View  Result Date: 10/19/2016 CLINICAL DATA:  Shortness of breath, chest pain EXAM: CHEST  2 VIEW COMPARISON:  09/05/2012 FINDINGS: There is hyperinflation of the lungs compatible with COPD. Heart and mediastinal contours are within normal limits. No focal opacities or effusions. No acute bony abnormality. IMPRESSION: COPD.  No active disease. Electronically Signed   By: Rolm Baptise M.D.   On: 10/19/2016 18:23   US Breast Ltd Uni Left Inc Axilla  Result Date: 10/27/2016 CLINICAL DATA:  Left upper outer quadrant area of palpable concern. EXAM: 2D DIGITAL DIAGNOSTIC BILATERAL MAMMOGRAM WITH CAD AND ADJUNCT TOMO ULTRASOUND LEFT BREAST COMPARISON:  Previous exam(s). ACR Breast Density Category d: The breast tissue is extremely dense, which lowers the sensitivity of mammography. FINDINGS: There is a spiculated mass in the superior left breast, far posterior depth, seen on the MLO view only due to far posterior location. This finding corresponds to the area of palpable concern in the left breast upper outer quadrant. Few mm clusters of calcifications are seen throughout the posterior portion of the left breast upper outer quadrant. The calcifications demonstrate indeterminate appearance. The total tissue involved measures 5.1 by 9.0 by 3.5 cm. A a group in the more inferior portion of the left breast upper outer quadrant, middle depth, is marked and may be considered for biopsy to establish histologic diagnosis. There is no mammographic evidence of malignancy in the right breast. Mammographic images were processed with CAD. On physical exam, there is a firm fixed irregular mass measuring approximately 5 cm in the left 1 o'clock breast, far  posterior depth. Targeted ultrasound is performed, showing irregular hypoechoic mass containing calcifications in the left breast 1 o'clock 10 cm from the nipple measuring 4.8 x 0.8 x 3.1 cm. There are at least 4 abnormal lymph nodes in the left axilla, 2 of which demonstrate near complete hilar effacement. IMPRESSION: Highly suspicious palpable 4.8 cm left breast upper outer quadrant mass, with probably malignant left axillary lymphadenopathy. Scattered groups of indeterminate calcifications in the left breast upper outer quadrant. A group farthest from the index mass is marked for stereotactic biopsy. No mammographic evidence of malignancy in the right breast. RECOMMENDATION: Ultrasound-guided core needle biopsy of left breast 1 o'clock mass and 1 of the abnormal left axillary lymph nodes. Stereotactic core needle biopsy of calcifications in the left breast upper outer quadrant. This may be technically challenging due to patient's breast thickness, which was discussed with the patient at the time of the ultrasound exam. I have discussed the findings and recommendations with the patient. Results were also provided in writing at the conclusion of the visit. If applicable, a reminder letter will be sent to the patient regarding the next appointment. BI-RADS CATEGORY  5: Highly suggestive of malignancy. Electronically Signed   By: Fidela Salisbury M.D.   On: 10/27/2016 16:04   Mm Diag Breast Tomo Bilateral  Result Date: 10/27/2016 CLINICAL DATA:  Left upper outer quadrant area of palpable concern. EXAM: 2D DIGITAL DIAGNOSTIC BILATERAL MAMMOGRAM WITH CAD AND ADJUNCT TOMO ULTRASOUND LEFT BREAST COMPARISON:  Previous exam(s). ACR Breast Density Category d: The breast tissue is extremely dense, which lowers the sensitivity of mammography. FINDINGS: There is a spiculated mass in the superior left breast, far posterior depth, seen on the MLO view only due to far posterior location. This finding corresponds to the  area of palpable concern in the left breast upper outer quadrant. Few mm clusters of calcifications are seen throughout the posterior portion of the left breast upper outer quadrant. The calcifications demonstrate indeterminate appearance. The total tissue involved measures 5.1 by 9.0 by 3.5 cm. A a group in the more inferior portion of the left breast upper outer quadrant, middle depth, is marked and may be considered for biopsy to establish histologic diagnosis. There is no mammographic evidence of malignancy in the right breast. Mammographic images were processed with CAD. On physical exam, there is a firm fixed irregular mass measuring approximately 5 cm in the left 1 o'clock breast, far posterior depth. Targeted ultrasound is performed, showing irregular hypoechoic mass containing calcifications in the left breast 1 o'clock 10 cm from the  nipple measuring 4.8 x 0.8 x 3.1 cm. There are at least 4 abnormal lymph nodes in the left axilla, 2 of which demonstrate near complete hilar effacement. IMPRESSION: Highly suspicious palpable 4.8 cm left breast upper outer quadrant mass, with probably malignant left axillary lymphadenopathy. Scattered groups of indeterminate calcifications in the left breast upper outer quadrant. A group farthest from the index mass is marked for stereotactic biopsy. No mammographic evidence of malignancy in the right breast. RECOMMENDATION: Ultrasound-guided core needle biopsy of left breast 1 o'clock mass and 1 of the abnormal left axillary lymph nodes. Stereotactic core needle biopsy of calcifications in the left breast upper outer quadrant. This may be technically challenging due to patient's breast thickness, which was discussed with the patient at the time of the ultrasound exam. I have discussed the findings and recommendations with the patient. Results were also provided in writing at the conclusion of the visit. If applicable, a reminder letter will be sent to the patient regarding  the next appointment. BI-RADS CATEGORY  5: Highly suggestive of malignancy. Electronically Signed   By: Fidela Salisbury M.D.   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 This was found to be concordant by Dr. Franki Cabot. Pathology results were discussed with the patient by telephone. The patient reported doing well after the biopsies with tenderness at the sites. Post biopsy instructions and care were reviewed and questions were answered. The patient was encouraged to call [facility] for any additional concerns. The patient was referred to the [facility] at the [facility] on November 09, 2016. A bilateral breast MRI is recommended due to breast density and to determine extent of disease. Pathology results reported by Susa Raring RN, BSN on 11/03/2016. Electronically Signed   By: Franki Cabot M.D.   On: 11/03/2016 07:49   Result Date: 11/03/2016 CLINICAL DATA:  Patient with a suspicious mass within the left breast at the 1 o'clock axis presents today for ultrasound-guided biopsy. Patient with additional suspicious lymph nodes in the left axilla for which patient also presents today for ultrasound-guided biopsy. EXAM: ULTRASOUND GUIDED LEFT BREAST CORE NEEDLE BIOPSY ULTRASOUND-GUIDED LEFT AXILLA CORE NEEDLE BIOPSY COMPARISON:  Previous exam(s). PROCEDURE: I met with the patient and we discussed the procedure of ultrasound-guided biopsy, including benefits and  alternatives. We discussed the high likelihood of a successful procedure. We discussed the risks of the procedure including infection, bleeding, tissue injury, clip migration, and inadequate sampling. Informed written consent was given. The usual time-out protocol was performed immediately prior to the procedure. Lesion quadrant: Upper outer quadrant Using sterile technique and 1% Lidocaine as local anesthetic, under direct ultrasound visualization, a 12 gauge spring-loaded device was used to perform biopsy of the left breast mass at the 1 o'clock axisusing a lateral approach. At the conclusion of the procedure, a ribbon shaped tissue marker clip was deployed into the biopsy cavity. Next, using sterile technique and 1% lidocaine as local anesthetic, under direct ultrasound visualization, a 14 gauge spring-loaded  device was used to perform biopsy of 1 of the morphologically abnormal lymph nodes in the left axilla using a lateral approach. At the conclusion, a heart shaped clip was placed into the lymph node. Next, using sterile technique and 1% lidocaine as local anesthetic, under direct ultrasound visualization, a 14 gauge spring-loaded device was used to perform biopsy of a second morphologically abnormal lymph node in the left axilla using a lateral approach. At the conclusion, a spiral shaped clip was placed into the lymph node. Follow-up 2-view mammogram was performed and dictated separately. IMPRESSION: 1. Ultrasound-guided biopsy of the left breast mass at the 1 o'clock axis. Ribbon shaped biopsy clip placed at the biopsy site. 2. Ultrasound-guided biopsy of 1 of the morphologically abnormal lymph nodes in the left axilla. Heart shaped clip placed into the lymph node. 3. Ultrasound-guided biopsy of a second morphologically abnormal lymph node in the left axilla. Spiral shaped clip placed into the lymph node. Electronically Signed: By: Franki Cabot M.D. On: 11/01/2016 13:11   Mm Clip Placement Left  Result  Date: 11/01/2016 CLINICAL DATA:  Status post stereotactic biopsy for left breast calcifications. Also status post ultrasound-guided biopsies of a left breast mass at the 1 o'clock axis as well as 2 morphologically abnormal lymph nodes in the left axilla. EXAM: DIAGNOSTIC LEFT MAMMOGRAM POST STEREOTACTIC AND ULTRASOUND BIOPSIES COMPARISON:  Previous exam(s). FINDINGS: Mammographic images were obtained following stereotactic and ultrasound-guided guided biopsies of left breast calcifications, left breast mass at the 1 o'clock axis and 2 morphologically abnormal lymph nodes in the left axilla. Coil shaped biopsy clip appears appropriately positioned at the site of the biopsied calcifications in the upper-outer quadrant of the left breast. Ribbon shaped biopsy clip is well-positioned at the site of the targeted mass in the left breast at the 1 o'clock axis. Heart shaped and spiral shaped clips are well-positioned at the site of the targeted lymph nodes in the left axilla. IMPRESSION: All biopsy clips are appropriately positioned in the left breast and in the left axilla, as detailed above. Final Assessment: Post Procedure Mammograms for Marker Placement Electronically Signed   By: Franki Cabot M.D.   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 This was found to be concordant by Dr. Franki Cabot. Pathology results were discussed with the patient by telephone. The patient reported doing well after the biopsies with  tenderness at the sites. Post biopsy instructions and care were reviewed and questions were answered. The patient was encouraged to call [facility] for any additional concerns. The patient was referred to the [facility] at the [facility] on November 09, 2016. A bilateral breast MRI is recommended due to breast density and to determine extent of disease. Pathology results reported by Susa Raring RN, BSN on 11/03/2016. Electronically Signed   By: Franki Cabot M.D.   On: 11/03/2016 07:49   Result Date: 11/03/2016 CLINICAL DATA:  Patient with suspicious calcifications within the upper-outer  quadrant of the left breast presents for stereotactic biopsy. EXAM: LEFT BREAST STEREOTACTIC CORE NEEDLE BIOPSY COMPARISON:  Previous exams. FINDINGS: The patient and I discussed the procedure of stereotactic-guided biopsy including benefits and alternatives. We discussed the high likelihood of a successful procedure. We discussed the risks of the procedure including infection, bleeding, tissue injury, clip migration, and inadequate sampling. Informed written consent was given. The usual time out protocol was performed immediately prior to the procedure. Using sterile technique and 1% Lidocaine as local anesthetic, under stereotactic guidance, a 9 gauge vacuum assisted device was used to perform core needle biopsy of calcifications in the upper outer quadrant of the left breast using a lateral approach. Specimen radiograph was performed showing calcifications. Specimens with calcifications are identified for pathology. Lesion quadrant: Upper outer quadrant At the conclusion of the procedure, a coil shaped tissue marker clip was deployed into the biopsy cavity. Follow-up 2-view mammogram was performed and dictated separately. IMPRESSION: Stereotactic-guided biopsy of calcifications within the upper-outer quadrant of the left breast. No apparent  complications. Electronically Signed: By: Franki Cabot M.D. 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 This was found to be concordant by Dr. Franki Cabot. Pathology results were discussed with the patient by telephone. The patient reported doing well after the biopsies with tenderness at the sites. Post biopsy instructions and care were reviewed and questions were answered. The patient was encouraged to call [facility] for any additional concerns. The patient was referred to the [facility] at the [facility] on November 09, 2016. A bilateral breast MRI is recommended due to breast density and to determine extent of disease. Pathology results reported by Susa Raring RN, BSN on 11/03/2016. Electronically Signed   By: Franki Cabot M.D.   On: 11/03/2016 07:49   Result Date: 11/03/2016 CLINICAL DATA:  Patient with a suspicious mass within the left breast at the 1 o'clock axis presents today for ultrasound-guided biopsy. Patient with additional suspicious lymph nodes in the left axilla for which patient also presents today for ultrasound-guided biopsy. EXAM: ULTRASOUND GUIDED LEFT BREAST CORE NEEDLE BIOPSY ULTRASOUND-GUIDED LEFT AXILLA CORE NEEDLE BIOPSY COMPARISON:  Previous exam(s). PROCEDURE: I met with the patient and we discussed the procedure of ultrasound-guided biopsy, including benefits and alternatives. We discussed the high likelihood  of a successful procedure. We discussed the risks of the procedure including infection, bleeding, tissue injury, clip migration, and inadequate sampling. Informed written consent was given. The usual time-out protocol was performed immediately prior to the procedure. Lesion quadrant: Upper outer quadrant Using sterile technique and 1% Lidocaine as local anesthetic, under direct ultrasound visualization, a 12 gauge spring-loaded device was used to perform biopsy of the left breast mass at the 1 o'clock axisusing a lateral approach. At the conclusion of the procedure, a ribbon shaped tissue marker clip was deployed into the biopsy cavity. Next, using sterile technique and 1% lidocaine as local anesthetic, under direct ultrasound visualization, a 14  gauge spring-loaded device was used to perform biopsy of 1 of the morphologically abnormal lymph nodes in the left axilla using a lateral approach. At the conclusion, a heart shaped clip was placed into the lymph node. Next, using sterile technique and 1% lidocaine as local anesthetic, under direct ultrasound visualization, a 14 gauge spring-loaded device was used to perform biopsy of a second morphologically abnormal lymph node in the left axilla using a lateral approach. At the conclusion, a spiral shaped clip was placed into the lymph node. Follow-up 2-view mammogram was performed and dictated separately. IMPRESSION: 1. Ultrasound-guided biopsy of the left breast mass at the 1 o'clock axis. Ribbon shaped biopsy clip placed at the biopsy site. 2. Ultrasound-guided biopsy of 1 of the morphologically abnormal lymph nodes in the left axilla. Heart shaped clip placed into the lymph node. 3. Ultrasound-guided biopsy of a second morphologically abnormal lymph node in the left axilla. Spiral shaped clip placed into the lymph node. Electronically Signed: By: Franki Cabot M.D. On: 11/01/2016 13:11       IMPRESSION/PLAN: 1. 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 Dr. Lisbeth Renshaw discusses the pathology findings and reviews the nature of invasive breast disease. The consensus from the breast conference includeded an MRI for extent of disease and dense tissue. She would be offered genetic evaluation as well and surgical recommendations would be for mastectomy of the left breast with axillary lymph node dissection followed by adjuvant chemotherapy, and her  course would then be followed by external radiotherapy to the breast and regional nodes followed by antiestrogen therapy. We discussed the risks, benefits, short, and long term effects of radiotherapy, and the patient is interested in proceeding. Dr. Lisbeth Renshaw discusses the delivery and logistics of radiotherapy and would anticipate a course of 6 1/2 weeks of radiotherapy to the breast and regional nodes. We will see her back about 4 weeks after completing chemotherapy to start the process of radiotherapy. 2. Possible genetic predisposition to malignancy. Given the patient's young age and type of breast cancer, she will meet with genetic counseling to discuss testing.   The above documentation reflects my direct findings during this shared patient visit. Please see the separate note by Dr. Lisbeth Renshaw on this date for the remainder of the patient's plan of care.    Carola Rhine, PAC

## 2016-11-09 NOTE — Progress Notes (Signed)
Nutrition Assessment   Reason for Assessment:   Patient seen in Breast Clinic  ASSESSMENT:  41 year old female with new diagnosis of left breast cancer.  Past medical history of polysubstance abuse, bipolar, major depressive disorder.    Patient reports that she eats all the time during the day and up late at night.  "I just can't gain any weight."  Patient reports that she prepares meals and does not have problems with having access to food and has means to prepare meals.    Patient reports that yesterday she ate 2-3 hamburgers, rice and gravy, then had nachos.    Nutrition Focused Physical Exam: Nutrition-Focused physical exam completed. Findings are moderate-severe fat depletion, moderate to severe muscle depletion, and no edema.   Medications: reviewed  Labs: reviewed  Anthropometrics:   Height: 66 inches Weight: 91 lb UBW: 90# except when was pregnant BMI: 14.8   Estimated Energy Needs  Kcals: 1200-1430 calories/d Protein: 60-72 g/d Fluid: 1.4 L/d  NUTRITION DIAGNOSIS: Increased nutrient needs related to cancer and treatment plan as evidenced by BMI 14.8, muscle and fat mass depletion    INTERVENTION:   Discussed importance of nutrition with upcoming treatment plan.   Discussed ways to increase calories and protein and encouraged eating everything 2-3 hours Discussed oral nutrition supplements and encouraged 1-2 per day in addition to normal intake.  Provided samples and coupons today.    MONITORING, EVALUATION, GOAL: Patient will consume adequate calories to prevent weight loss and improve nutrition   NEXT VISIT:  As needed during treatment  Dhyana Bastone B. Zenia Resides, [place], [place] Registered Dietitian [phone] (pager)

## 2016-11-09 NOTE — Progress Notes (Signed)
[place] NOTE  Patient Care Team: Department, [facility] as PCP - General [name], MD as Consulting Physician (General Surgery) Nicholas Lose, MD as Consulting Physician (Hematology and Oncology) Kyung Rudd, MD as Consulting Physician (Radiation Oncology)  CHIEF COMPLAINTS/PURPOSE OF CONSULTATION:  Newly diagnosed breast cancer  HISTORY OF PRESENTING ILLNESS:  Diane Rosario 41 y.o. female is here because of recent diagnosis of left breast cancer. Patient has had nipple discharge for over a year. She thought it was related to Risperdal injections. For the past month or so she has noticed a palpable lump in the left breast. Subsequently she underwent a mammogram. The mammogram revealed extremely dense breasts but ultrasound revealed 4.8 cm lesion in the left breast. There was also anterior axillary fold lesion and multiple enlarged axillary lymph nodes. 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 She was presented this morning at the multidisciplinary tumor board and she is here today at [facility] to discuss the treatment plan. Patient has had long-standing issues with the bipolar disorder and substance abuse. She appears to be doing much better. She tells me that she eats very well but has not been gaining weight.  I reviewed her records extensively and collaborated the history with the patient.  SUMMARY OF ONCOLOGIC HISTORY:   Malignant neoplasm of upper-outer quadrant of left breast in female, estrogen receptor positive ([place])   11/01/2016 Initial Diagnosis    Palpable left breast mass with 4 enl axillary lymph nodes 4.8 x 0.8 x 3.1 cm; left axillary mass 1.1 x 0.5 cm, lymph node 1.4 x 0.8 cm biopsy grade 2-3 IDC ER 30% PR 0% HER-2 positive ratio 7.53, Ki-67 20%, T2 N2 MX stage IIIa clinical stage        MEDICAL HISTORY:  Past Medical History:  Diagnosis Date  . Anxiety   . Bipolar 1 disorder ([place])    pt reports bipolar, pt reports Auditory hallucinations  . Depression   . Malignant neoplasm of upper-outer quadrant of left breast in female, estrogen receptor positive ([place]) 11/03/2016  . PTSD (post-traumatic stress disorder)    husband was murdered    SURGICAL HISTORY: Past Surgical History:  Procedure Laterality Date  . EYE SURGERY      SOCIAL HISTORY: Social History   Social History  . Marital status: Widowed    Spouse name: N/A  . Number of children: N/A  . Years of education: N/A   Occupational History  . Not on file.   Social History Main Topics  . Smoking status: Current Every Day Smoker    Packs/day: 0.25    Years: 10.00    Types: Cigarettes  . Smokeless tobacco: Never Used  . Alcohol use Yes     Comment: occasionally  . Drug use: Yes    Types: Marijuana, Cocaine     Comment: crack  . Sexual activity: Not on file   Other Topics Concern  . Not on file   Social History Narrative  . No narrative on file    FAMILY HISTORY: Family History  Problem Relation Age of Onset  . Hypertension Maternal Aunt   . Hypertension Maternal Grandmother     ALLERGIES:  is allergic to asa [aspirin].  MEDICATIONS:  Current Outpatient Prescriptions  Medication Sig Dispense Refill  . ALPRAZolam (XANAX) 0.5 MG tablet Take 0.5 mg  by mouth as needed for anxiety.    . haloperidol (HALDOL) 10 MG tablet Take 10 mg by mouth at bedtime.   1  . risperiDONE microspheres (RISPERDAL CONSTA) 50 MG injection Inject 50 mg into the muscle every 14 (fourteen) days.     No current facility-administered medications for this visit.     REVIEW OF SYSTEMS:   Constitutional: Denies fevers, chills or abnormal night sweats Eyes: Denies blurriness of vision, double vision or watery eyes Ears, nose, mouth, throat, and face: Denies mucositis or sore throat Respiratory: Denies cough,  dyspnea or wheezes Cardiovascular: Denies palpitation, chest discomfort or lower extremity swelling Gastrointestinal:  Denies nausea, heartburn or change in bowel habits Skin: Denies abnormal skin rashes Lymphatics: Denies new lymphadenopathy or easy bruising Neurological:Denies numbness, tingling or new weaknesses Behavioral/Psych: History of bipolar disorder  Breast: Palpable lump in the left breast and anterior axilla along with lymphadenopathy All other systems were reviewed with the patient and are negative.  PHYSICAL EXAMINATION: ECOG PERFORMANCE STATUS: 2 - Symptomatic, <50% confined to bed  Vitals:   11/09/16 1253  BP: 101/67  Pulse: 98  Resp: 17  Temp: 98.2 F (36.8 C)   Filed Weights   11/09/16 1253  Weight: 91 lb 4.8 oz (41.4 kg)    GENERAL:alert, no distress and comfortable SKIN: skin color, texture, turgor are normal, no rashes or significant lesions EYES: normal, conjunctiva are pink and non-injected, sclera clear OROPHARYNX:no exudate, no erythema and lips, buccal mucosa, and tongue normal  NECK: supple, thyroid normal size, non-tender, without nodularity LYMPH:  no palpable lymphadenopathy in the cervical, axillary or inguinal LUNGS: clear to auscultation and percussion with normal breathing effort HEART: regular rate & rhythm and no murmurs and no lower extremity edema ABDOMEN:abdomen soft, non-tender and normal bowel sounds Musculoskeletal:no cyanosis of digits and no clubbing  PSYCH: alert & oriented x 3 with fluent speech NEURO: no focal motor/sensory deficits BREAST: Large palpable lump in the left breast as well as an anterior axillary fold  Palpable lymph node in the axilla palpable lymph nodes. No palpable axillary or supraclavicular lymphadenopathy (exam performed in the presence of a chaperone)   LABORATORY DATA:  I have reviewed the data as listed Lab Results  Component Value Date   WBC 6.9 11/09/2016   HGB 12.7 11/09/2016   HCT 38.6  11/09/2016   MCV 99.3 11/09/2016   PLT 285 11/09/2016   Lab Results  Component Value Date   NA 141 11/09/2016   K 3.7 11/09/2016   CL 106 10/19/2016   CO2 31 (H) 11/09/2016    RADIOGRAPHIC STUDIES: I have personally reviewed the radiological reports and agreed with the findings in the report.  ASSESSMENT AND PLAN:  Malignant neoplasm of upper-outer quadrant of left breast in female, estrogen receptor positive ([place]) 11/01/2016: Palpable left breast mass with 4 enl axillary lymph nodes 4.8 x 0.8 x 3.1 cm; left axillary mass 1.1 x 0.5 cm, lymph node 1.4 x 0.8 cm biopsy grade 2-3 IDC ER 30% PR 0% HER-2 positive ratio 7.53, Ki-67 20%, T2 N2 MX stage IIIa clinical stage  Pathology and radiology counseling: Discussed with the patient, the details of pathology including the type of breast cancer,the clinical staging, the significance of ER, PR and HER-2/neu receptors and the implications for treatment. After reviewing the pathology in detail, we proceeded to discuss the different treatment options between surgery, radiation, chemotherapy, antiestrogen therapies.  Recommendations: 1. Left mastectomy with axillary lymph node dissection 2. adjuvant chemotherapy with [facility]  Perjeta every 3 weeks 6 followed by Herceptin Perjeta maintenance for 6 months 3. Followed by adjuvant radiation 4. Followed by adjuvant antiestrogen therapy 5-7 years  Chemotherapy Counseling: I discussed the risks and benefits of chemotherapy including the risks of nausea/ vomiting, risk of infection from low WBC count, fatigue due to chemo or anemia, bruising or bleeding due to low platelets, mouth sores, loss/ change in taste and decreased appetite. Liver and kidney function will be monitored through out chemotherapy as abnormalities in liver and kidney function may be a side effect of treatment. Cardiac dysfunction due to Herceptin and Perjeta were discussed in detail. Risk of permanent bone marrow dysfunction and leukemia due  to chemo were also discussed.  Plan: 1. Genetics 2. breast MRI 3. CT chest abdomen pelvis and bone scan for staging 4. Echocardiogram 5. Chemotherapy class  Follow-up after surgery to discuss the results and come up with the final adjuvant treatment plan    All questions were answered. The patient knows to call the clinic with any problems, questions or concerns.    Rulon Eisenmenger, MD 11/09/16

## 2016-11-09 NOTE — Progress Notes (Signed)
[place] Psychosocial Distress Screening Spiritual Care  Met with Diane Rosario and her uncle Diane Rosario in  [facility] to introduce [facility] team/resources, reviewing distress screen per protocol.  The patient scored a 9 on the Psychosocial Distress Thermometer which indicates severe distress. Also assessed for distress and other psychosocial needs.   ONCBCN DISTRESS SCREENING 11/09/2016  Screening Type Initial Screening  Distress experienced in past week (1-10) 9  Practical problem type Transportation  Information Concerns Type Lack of info about diagnosis;Lack of info about treatment  Referral to clinical social work Yes  Referral to support programs Yes   Diane Rosario was drowsy during this encounter.  She states that she has a 51yo child and very little support.  Per uncle, housing stability is ok now but "questionable" long term.  Per uncle, pt's mom had a stroke and is unable to be supportive.  Per uncle, his support availability is limited because he lives in [place] and frequently travels to [place].  Follow up needed: Yes.   Referring to social work to address transportation concerns.  Plan to f/u with pt by phone/on campus as part of support team, but please also page if immediate needs arise.  Thank you.   [place], [place], [facility] Pager [phone] Voicemail [phone]

## 2016-11-09 NOTE — Assessment & Plan Note (Signed)
11/01/2016: Palpable left breast mass with 4 enl axillary lymph nodes 4.8 x 0.8 x 3.1 cm; left axillary mass 1.1 x 0.5 cm, lymph node 1.4 x 0.8 cm biopsy grade 2-3 IDC ER 30% PR 0% HER-2 positive ratio 7.53, Ki-67 20%, T2 N2 MX stage IIIa clinical stage  Pathology and radiology counseling: Discussed with the patient, the details of pathology including the type of breast cancer,the clinical staging, the significance of ER, PR and HER-2/neu receptors and the implications for treatment. After reviewing the pathology in detail, we proceeded to discuss the different treatment options between surgery, radiation, chemotherapy, antiestrogen therapies.  Recommendations: 1. Left mastectomy with axillary lymph node dissection 2. adjuvant chemotherapy with [facility] Perjeta every 3 weeks 6 followed by Herceptin Perjeta maintenance for 6 months 3. Followed by adjuvant radiation 4. Followed by adjuvant antiestrogen therapy 5-7 years  Chemotherapy Counseling: I discussed the risks and benefits of chemotherapy including the risks of nausea/ vomiting, risk of infection from low WBC count, fatigue due to chemo or anemia, bruising or bleeding due to low platelets, mouth sores, loss/ change in taste and decreased appetite. Liver and kidney function will be monitored through out chemotherapy as abnormalities in liver and kidney function may be a side effect of treatment. Cardiac dysfunction due to Herceptin and Perjeta were discussed in detail. Risk of permanent bone marrow dysfunction and leukemia due to chemo were also discussed.  Plan: 1. Genetics 2. breast MRI 3. CT chest abdomen pelvis and bone scan for staging 4. Echocardiogram 5. Chemotherapy class  Follow-up after surgery to discuss the results and come up with the final adjuvant treatment plan

## 2016-11-10 ENCOUNTER — Other Ambulatory Visit: Payer: Medicaid Other

## 2016-11-10 ENCOUNTER — Encounter: Payer: Self-pay | Admitting: *Deleted

## 2016-11-10 ENCOUNTER — Encounter: Payer: Medicaid Other | Admitting: Genetic Counselor

## 2016-11-10 NOTE — Progress Notes (Signed)
[place] Work  Clinical Social Work was referred by Clinical biochemist for assessment of psychosocial needs due to transportation concerns.  Clinical Social Worker reviewed chart and contacted patient via phone at home to offer support and assess for needs. CSW introduced self, explained role of CSW/Pt and Family Support Team, support groups and other resources to assist. Pt reports her boyfriend will usually bring her to appointments, but he will be out of town next week. Pt has full medicaid and can access [facility] transportation for all medical and mental health appointments. CSW instructed pt on how to access this resource through DSS and encouraged her to go ahead and contact them for next week appointments. Pt felt she could attend to this task independently. CSW discussed with pt briefly common emotions and the importance to maintain her relationships with her mental healthcare providers during her cancer treatment. Pt reports she has frequent contact with "[employer]", that provide community case management and assistance with her mental healthcare. She felt this issue was in "good shape" currently. CSW provided CSW team information and encouraged her to reach out as needed. CSW team to follow.     Clinical Social Work interventions: Resource education and referral  Loren Racer, LCSW, OSW-C Clinical Social Worker [place]  [place] Phone: [phone] Fax: [phone]

## 2016-11-15 ENCOUNTER — Encounter (HOSPITAL_COMMUNITY): Payer: Self-pay

## 2016-11-15 ENCOUNTER — Ambulatory Visit (HOSPITAL_COMMUNITY)
Admission: RE | Admit: 2016-11-15 | Discharge: 2016-11-15 | Disposition: A | Discharge status: Home / Self Care | Payer: Medicaid Other | Source: Ambulatory Visit | Attending: General Surgery | Admitting: General Surgery

## 2016-11-15 ENCOUNTER — Telehealth: Payer: Self-pay | Admitting: *Deleted

## 2016-11-15 DIAGNOSIS — C50412 Malignant neoplasm of upper-outer quadrant of left female breast: Secondary | ICD-10-CM | POA: Insufficient documentation

## 2016-11-15 DIAGNOSIS — Z170 Estrogen receptor positive status [ER+]: Secondary | ICD-10-CM | POA: Insufficient documentation

## 2016-11-15 MED ORDER — GADOBENATE DIMEGLUMINE 529 MG/ML IV SOLN
10.0000 mL | Freq: Once | INTRAVENOUS | Status: AC | PRN
  Administered 2016-11-15: 8 mL via INTRAVENOUS

## 2016-11-15 NOTE — Telephone Encounter (Signed)
Left message for a return phone call to follow up from [facility] 8/1.  Left message reminding her of the appointment today for her MRI at [facility] at 445pm.

## 2016-11-16 ENCOUNTER — Encounter (HOSPITAL_COMMUNITY)
Admission: RE | Admit: 2016-11-16 | Discharge: 2016-11-16 | Disposition: A | Discharge status: Home / Self Care | Payer: Medicaid Other | Source: Ambulatory Visit | Attending: Hematology and Oncology | Admitting: Hematology and Oncology

## 2016-11-16 ENCOUNTER — Ambulatory Visit (HOSPITAL_COMMUNITY)
Admission: RE | Admit: 2016-11-16 | Discharge: 2016-11-16 | Disposition: A | Discharge status: Home / Self Care | Payer: Medicaid Other | Source: Ambulatory Visit | Attending: Hematology and Oncology | Admitting: Hematology and Oncology

## 2016-11-16 DIAGNOSIS — C50412 Malignant neoplasm of upper-outer quadrant of left female breast: Secondary | ICD-10-CM | POA: Diagnosis present

## 2016-11-16 DIAGNOSIS — R590 Localized enlarged lymph nodes: Secondary | ICD-10-CM | POA: Diagnosis not present

## 2016-11-16 DIAGNOSIS — R911 Solitary pulmonary nodule: Secondary | ICD-10-CM | POA: Diagnosis not present

## 2016-11-16 DIAGNOSIS — Z170 Estrogen receptor positive status [ER+]: Secondary | ICD-10-CM | POA: Diagnosis not present

## 2016-11-16 IMAGING — NM NM BONE WHOLE BODY
4 series · 4 of 4 positions shown · non-contrast
Comparison: CT chest abdomen pelvis of [DATE]

CLINICAL DATA: New history of breast carcinoma with metastatic
lymph nodes

EXAM:
NUCLEAR MEDICINE WHOLE BODY BONE SCAN
TECHNIQUE: Whole body anterior and posterior images were obtained approximately
3 hours after intravenous injection of radiopharmaceutical.
RADIOPHARMACEUTICALS:  21.0 mCi [N6] MDP IV

[Series 1: wbr_bone_60 whole body · 2.66mm/px · 1 of 1 slices shown (1 of 2)]
[im 1/1]
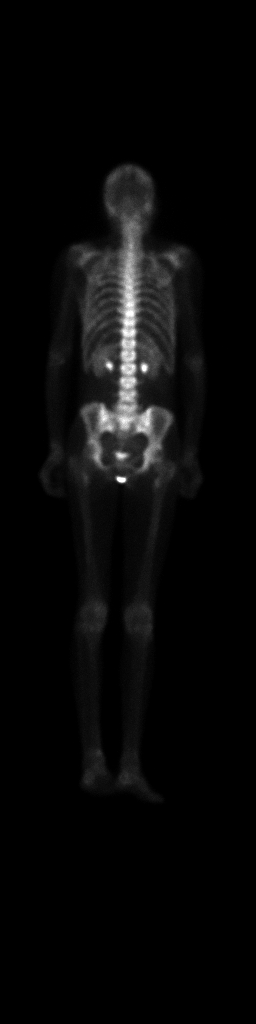

[Series 1: whole body · 2.66mm/px · 1 of 1 slices shown (1 of 2)]
[im 1/1]
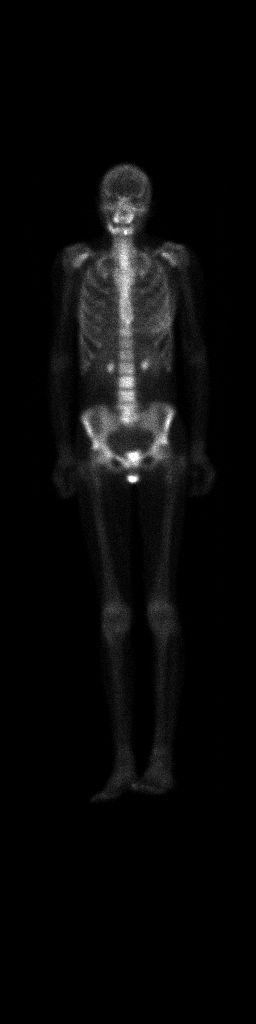

[Series 1: whole body · 2.66mm/px · 1 of 1 slices shown (2 of 2)]
[im 1/1]
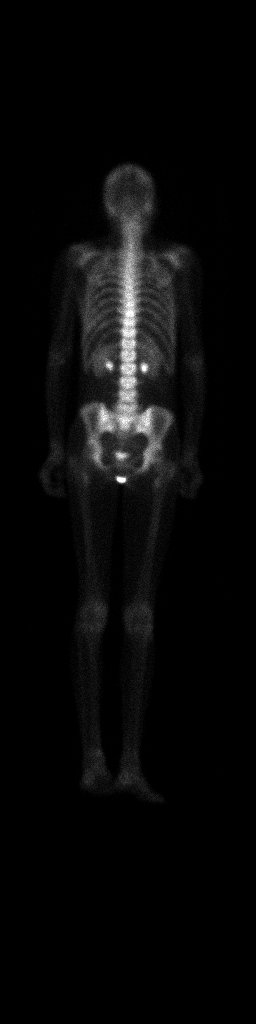

[Series 1: wbr_bone_60 whole body · 2.66mm/px · 1 of 1 slices shown (2 of 2)]
[im 1/1]
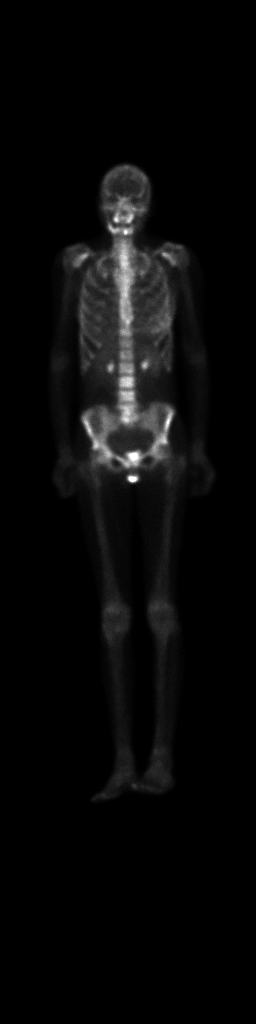

[4 of 4 positions shown; findings below may reference images not displayed]

FINDINGS: The patient was injected with 21.0 millicuries technetium 99 M MDP
intravenously, and total body imaging was performed. No abnormal
focus of increased activity is seen to indicate metastatic
involvement of the bones. The kidneys excrete the radionuclide.
IMPRESSION: Negative total body bone scan.

## 2016-11-16 IMAGING — CT CT CHEST W/ CM
2 of 5 series · 12 of 36 positions shown, 15 images · IV contrast (APPLIED)
Comparison: None.

CLINICAL DATA: Left-sided breast cancer.

EXAM:
CT CHEST, ABDOMEN, AND PELVIS WITH CONTRAST
TECHNIQUE: Multidetector CT imaging of the chest, abdomen and pelvis was
performed following the standard protocol during bolus
administration of intravenous contrast.
CONTRAST:  <See Chart> [94] IOPAMIDOL ([94]) INJECTION
61%

[Series 3: cap 5.0 i31f 2 · axial · 0.62mm/px · z∈[+918,+1408]mm · 9 of 124 slices shown, 12 images]
[im 13/124  mediastinal]
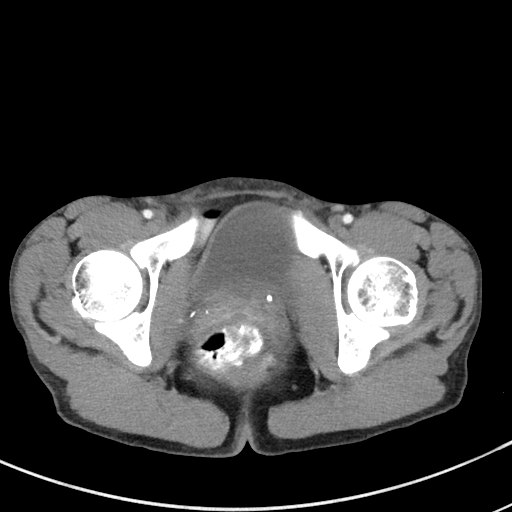
[im 13/124  lung]
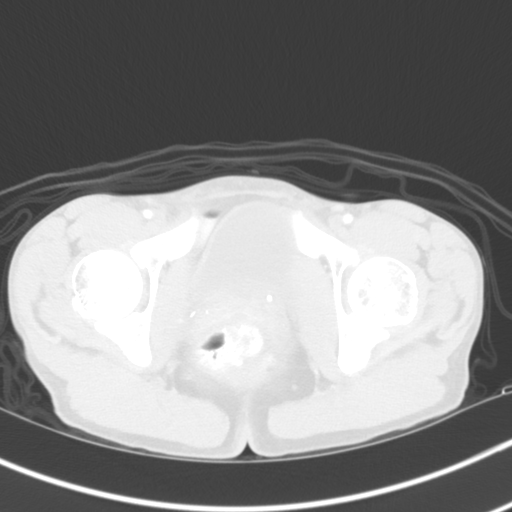
[im 25/124  lung]
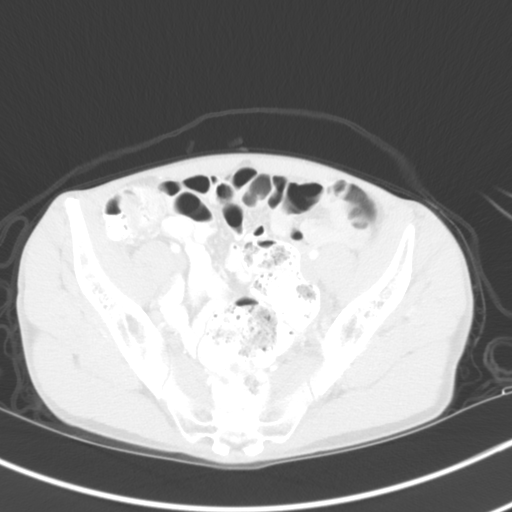
[im 37/124  lung]
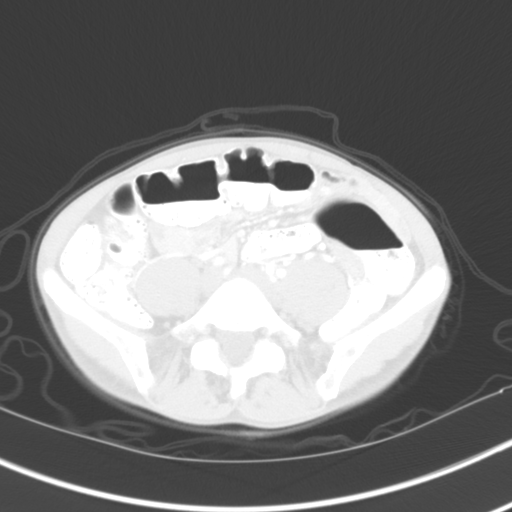
[im 50/124  lung]
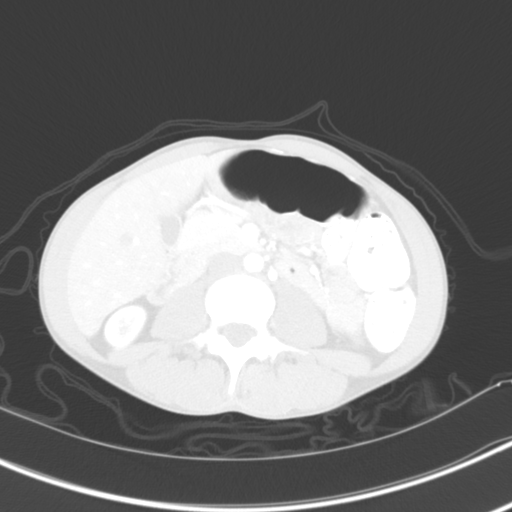
[im 62/124  mediastinal]
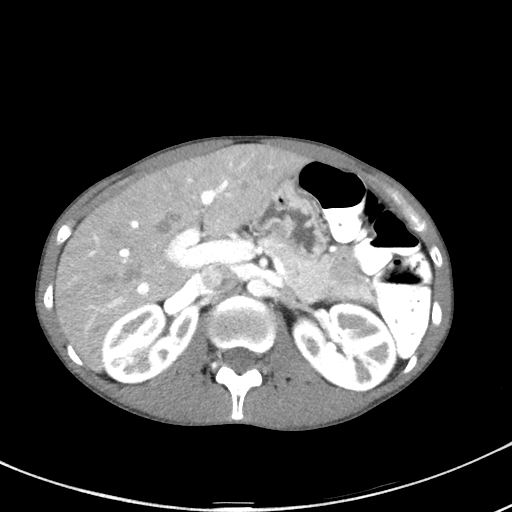
[im 62/124  lung]
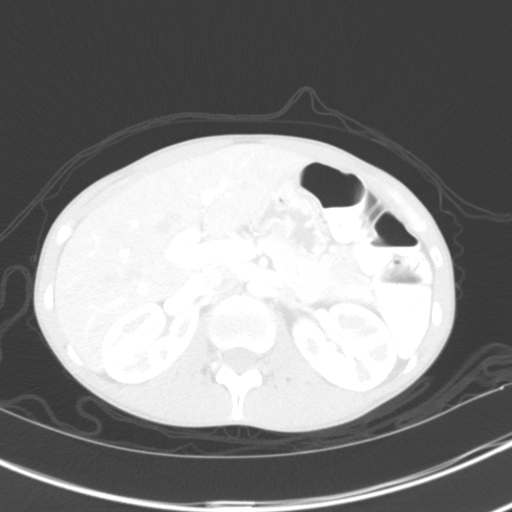
[im 74/124  lung]
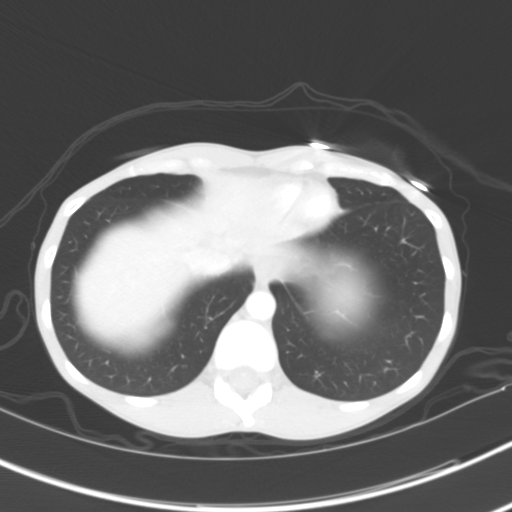
[im 87/124  lung]
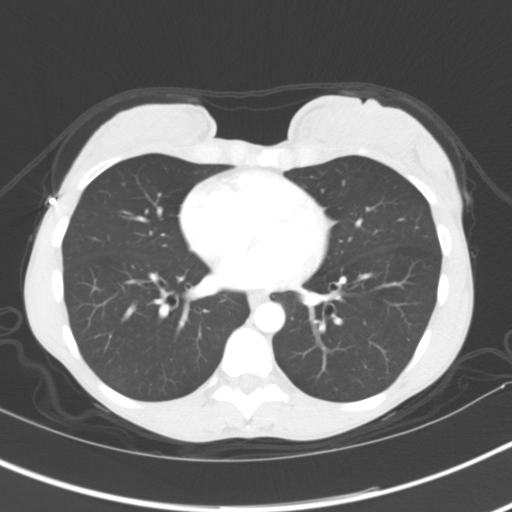
[im 99/124  lung]
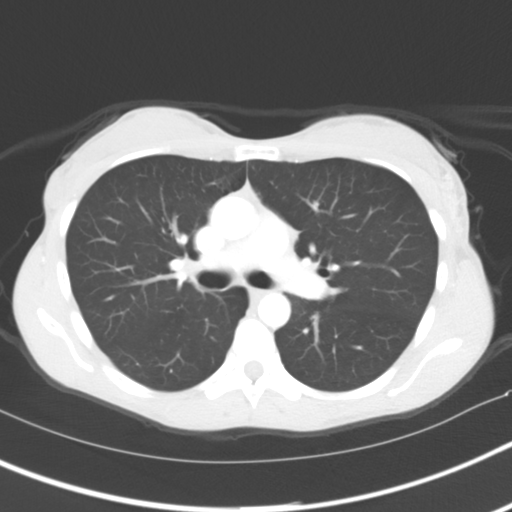
[im 111/124  mediastinal]
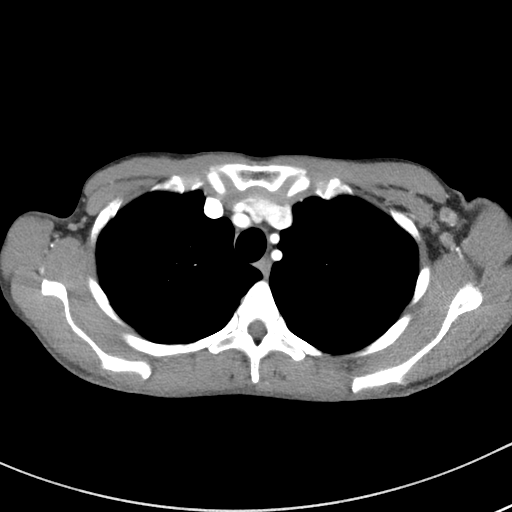
[im 111/124  lung]
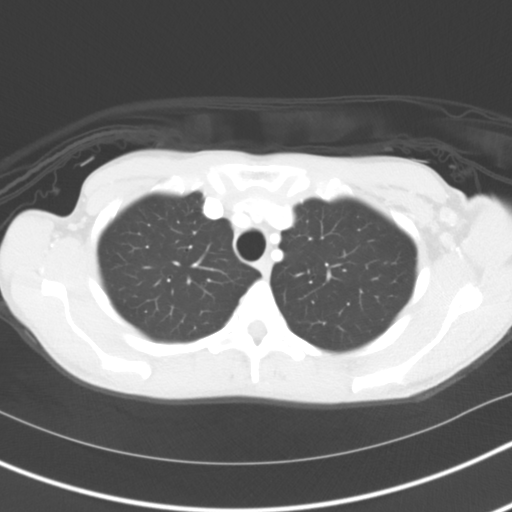

[Series 6: coronal · coronal · 0.68mm/px · 3 of 151 slices shown]
[im 31/151  lung]
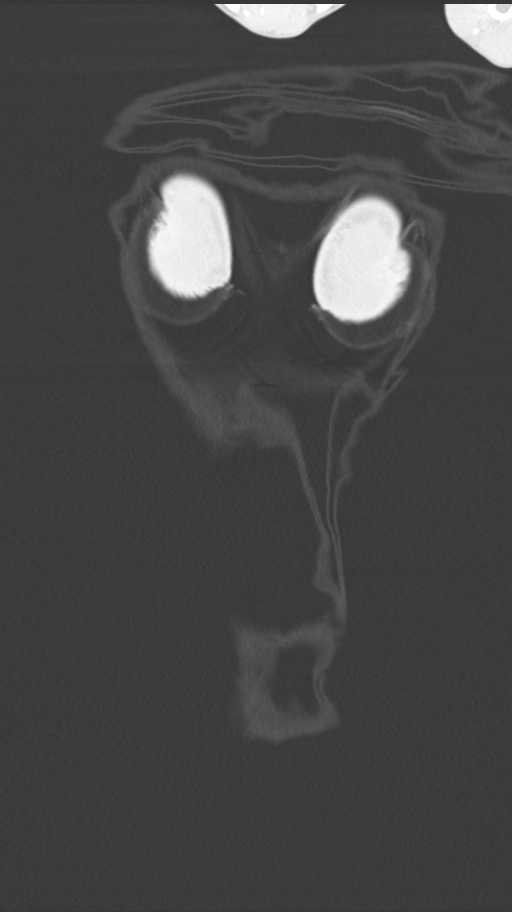
[im 61/151  lung]
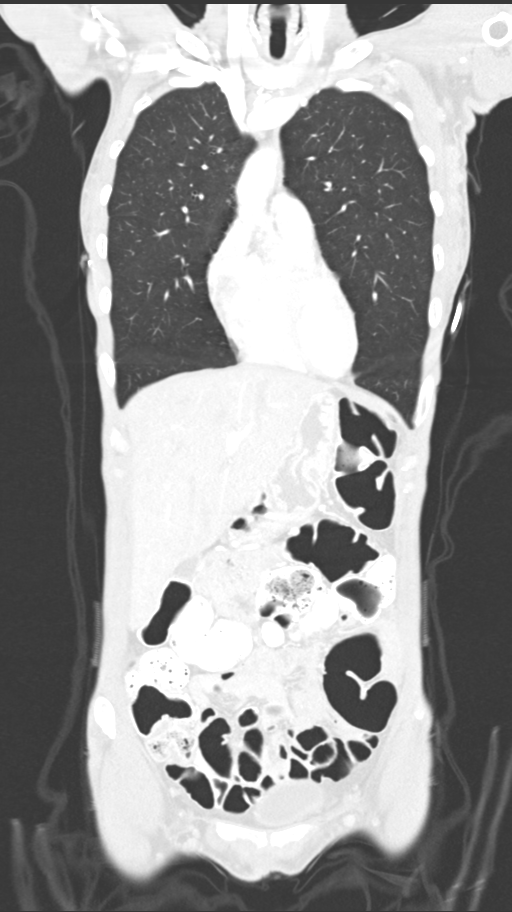
[im 91/151  lung]
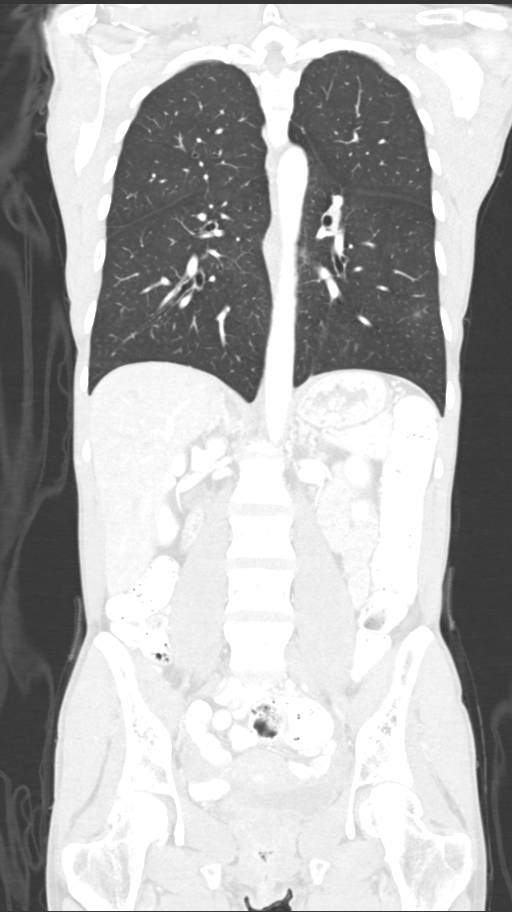

[12 of 36 positions shown; findings below may reference images not displayed]

FINDINGS: CT CHEST FINDINGS

Cardiovascular: The heart size is normal. No pericardial effusion.
No thoracic aortic aneurysm.

Mediastinum/Nodes: No mediastinal lymphadenopathy. There is no hilar
lymphadenopathy. The esophagus has normal imaging features. No
lymphadenopathy in the right axilla scattered small subpectoral
nodes on the left are suspicious. 9 mm irregular left axillary lymph
node shows heterogeneous enhancement. Ill-defined enhancing soft
tissue is identified in the lateral left breast region along the
anterior axillary line with a superficial 2.3 x 1.1 cm lesion
identified on image 22 of series 3.

Lungs/Pleura: No overtly suspicious pulmonary nodule or mass to
suggest metastatic disease. 11 mm ground-glass nodule in the left
lower lobe (image 107 series 4) is nonspecific. No pulmonary edema
or pleural effusion.

Musculoskeletal: Bone windows reveal no worrisome lytic or sclerotic
osseous lesions. 14 mm cystic or central low-attenuation lesion
identified in the left breast on image 35 of series 3.

CT ABDOMEN PELVIS FINDINGS

Hepatobiliary: No focal abnormality within the liver parenchyma.
There is no evidence for gallstones, gallbladder wall thickening, or
pericholecystic fluid. No intrahepatic or extrahepatic biliary
dilation.

Pancreas: No focal mass lesion. No dilatation of the main duct. No
intraparenchymal cyst. No peripancreatic edema.

Spleen: No splenomegaly. No focal mass lesion.

Adrenals/Urinary Tract: No adrenal nodule or mass. Kidneys are
unremarkable. No evidence for hydroureter. The urinary bladder
appears normal for the degree of distention.

Stomach/Bowel: Stomach is nondistended. No gastric wall thickening.
No evidence of outlet obstruction. Duodenum is normally positioned
as is the ligament of Treitz. No small bowel wall thickening. No
small bowel dilatation. The terminal ileum is normal. The appendix
is normal. No gross colonic mass. No colonic wall thickening. No
substantial diverticular change.

Vascular/Lymphatic: No abdominal aortic aneurysm. No abdominal
aortic atherosclerotic calcification. There is no gastrohepatic or
hepatoduodenal ligament lymphadenopathy. No intraperitoneal or
retroperitoneal lymphadenopathy. No pelvic sidewall lymphadenopathy.

Reproductive: The uterus has normal CT imaging appearance. There is
no adnexal mass.

Other: No intraperitoneal free fluid.

Musculoskeletal: Bone windows reveal no worrisome lytic or sclerotic
osseous lesions.
IMPRESSION: 1. Asymmetric mild lymphadenopathy in the left axilla is associated
with enhancing soft tissue nodularity in the lateral left breast/low
left anterior axillary line. Small left subpectoral lymph nodes are
associated. Features are suspicious for metastatic disease.
2. 11 mm ground-glass nodule in the left lower lobe would not be
typical for metastatic disease. This is likely infectious/
inflammatory. Attention on follow-up recommended.
3. No evidence for metastatic disease in the abdomen or pelvis.

## 2016-11-16 MED ORDER — TECHNETIUM TC 99M MEDRONATE IV KIT
25.0000 | PACK | Freq: Once | INTRAVENOUS | Status: AC | PRN
  Administered 2016-11-16: 25 via INTRAVENOUS

## 2016-11-16 MED ORDER — IOPAMIDOL (ISOVUE-300) INJECTION 61%
INTRAVENOUS | Status: AC
  Administered 2016-11-16: 75 mL via INTRAVENOUS
  Filled 2016-11-16: qty 100

## 2016-11-22 ENCOUNTER — Telehealth (HOSPITAL_COMMUNITY): Payer: Self-pay | Admitting: Vascular Surgery

## 2016-11-22 NOTE — Telephone Encounter (Signed)
Left pt message to make NP brst appt w. Echo in NOV

## 2016-11-23 ENCOUNTER — Encounter: Payer: Self-pay | Admitting: Genetic Counselor

## 2016-11-23 ENCOUNTER — Other Ambulatory Visit: Payer: Medicaid Other

## 2016-11-23 ENCOUNTER — Ambulatory Visit (HOSPITAL_BASED_OUTPATIENT_CLINIC_OR_DEPARTMENT_OTHER): Payer: Medicaid Other | Admitting: Genetic Counselor

## 2016-11-23 DIAGNOSIS — Z809 Family history of malignant neoplasm, unspecified: Secondary | ICD-10-CM | POA: Diagnosis not present

## 2016-11-23 DIAGNOSIS — Z170 Estrogen receptor positive status [ER+]: Secondary | ICD-10-CM

## 2016-11-23 DIAGNOSIS — Z315 Encounter for genetic counseling: Secondary | ICD-10-CM | POA: Diagnosis not present

## 2016-11-23 DIAGNOSIS — C50412 Malignant neoplasm of upper-outer quadrant of left female breast: Secondary | ICD-10-CM | POA: Diagnosis not present

## 2016-11-23 NOTE — Progress Notes (Signed)
REFERRING PROVIDER: Nicholas Lose, MD  PRIMARY PROVIDER:  Department, [place] VISIT:  1. Malignant neoplasm of upper-outer quadrant of left breast in female, estrogen receptor positive ([place])      HISTORY OF PRESENT ILLNESS:   Diane Rosario, a 41 y.o. female, was seen for a [facility] cancer genetics consultation at the request of Dr. Department due to a personal history of cancer.  Ms. Sweney presents to clinic today to discuss the possibility of a hereditary predisposition to cancer, genetic testing, and to further clarify her future cancer risks, as well as potential cancer risks for family members.   In 2018, at the age of 45, [name] was diagnosed with invasive ductal carcinoma of the left breast. The tumor is ER+/PR+/Her2-. This will be treated with a unilateral mastectomy, chemotherapy, radiation and tamoxifen.      CANCER HISTORY:    Malignant neoplasm of upper-outer quadrant of left breast in female, estrogen receptor positive ([place])   11/01/2016 Initial Diagnosis    Palpable left breast mass with 4 enl axillary lymph nodes 4.8 x 0.8 x 3.1 cm; left axillary mass 1.1 x 0.5 cm, lymph node 1.4 x 0.8 cm biopsy grade 2-3 IDC ER 30% PR 0% HER-2 positive ratio 7.53, Ki-67 20%, T2 N2 MX stage IIIa clinical stage        HORMONAL RISK FACTORS:  Menarche was at age 46.  First live birth at age 36.  OCP use for approximately 0 years.  Ovaries intact: yes.  Hysterectomy: no.  Menopausal status: premenopausal.  HRT use: 0 years. Colonoscopy: no; not examined. Mammogram within the last year: yes. Number of breast biopsies: 1. Up to date with pelvic exams:  yes. Any excessive radiation exposure in the past:  no  Past Medical History:  Diagnosis Date  . Anxiety   . Bipolar 1 disorder ([place])    pt reports bipolar, pt reports Auditory hallucinations  . Depression   . Malignant neoplasm of upper-outer quadrant of left breast in female, estrogen  receptor positive ([place]) 11/03/2016  . PTSD (post-traumatic stress disorder)    husband was murdered    Past Surgical History:  Procedure Laterality Date  . EYE SURGERY      Social History   Social History  . Marital status: Widowed    Spouse name: N/A  . Number of children: N/A  . Years of education: N/A   Social History Main Topics  . Smoking status: Current Every Day Smoker    Packs/day: 0.25    Years: 10.00    Types: Cigarettes  . Smokeless tobacco: Never Used  . Alcohol use Yes     Comment: occasionally  . Drug use: Yes    Types: Marijuana, Cocaine     Comment: crack  . Sexual activity: Not Asked   Other Topics Concern  . None   Social History Narrative  . None     FAMILY HISTORY:  We obtained a detailed, 4-generation family history.  Significant diagnoses are listed below: Family History  Problem Relation Age of Onset  . Hypertension Maternal Aunt   . Hypertension Maternal Grandmother   . Cirrhosis Father   . Cancer Father        unknown type    The patient has one son who is cancer free.  She has two sisters who are cancer free.  Her father is deceased.  He had cirrhosis of the liver and possibly a cancer.  He had 15-16 siblings.  The patient is not aware that anyone had cancer.  Her mother is living.  She had a TAH-BSO after the patient was born for unknown reasons.  She has a full sister and a maternal half sister. Neither have a history of cancer.  The patient's maternal grandmother is living and healthy.  Her grandfather is deceased.  [name] is unaware of previous family history of genetic testing for hereditary cancer risks. Patient's maternal ancestors are of African American descent, and paternal ancestors are of African American descent. There is no reported Ashkenazi Jewish ancestry. There is no known consanguinity.  GENETIC COUNSELING ASSESSMENT: Diane Rosario is a 41 y.o. female with a personal history of early onset breast cancer which  is somewhat suggestive of a hereditary cancer syndrome and predisposition to cancer. We, therefore, discussed and recommended the following at today's visit.   DISCUSSION: We discussed that about 5-10% of breast cancer is hereditary with most cases due to BRCA mutations.  Other genes can also be implicated in hereditary breast cancer syndromes, specifically ATM, CHEK2 and PALB2.  We reviewed the characteristics, features and inheritance patterns of hereditary cancer syndromes. We also discussed genetic testing, including the appropriate family members to test, the process of testing, insurance coverage and turn-around-time for results. We discussed the implications of a negative, positive and/or variant of uncertain significant result. In order to get genetic test results in a timely manner so that [name] can use these genetic test results for surgical decisions, we recommended [name] pursue genetic testing for the 9 gene STAT panel. If this test is negative, we then recommend [name] pursue reflex genetic testing to the common hereditary cancer gene panel. The Hereditary Gene Panel offered by [facility] includes sequencing and/or deletion duplication testing of the following 46 genes: APC, ATM, AXIN2, BARD1, BMPR1A, BRCA1, BRCA2, BRIP1, CDH1, CDKN2A (p14ARF), CDKN2A (p16INK4a), CHEK2, CTNNA1, DICER1, EPCAM (Deletion/duplication testing only), GREM1 (promoter region deletion/duplication testing only), KIT, MEN1, MLH1, MSH2, MSH3, MSH6, MUTYH, NBN, NF1, NHTL1, PALB2, PDGFRA, PMS2, POLD1, POLE, PTEN, RAD50, RAD51C, RAD51D, SDHB, SDHC, SDHD, SMAD4, SMARCA4. STK11, TP53, TSC1, TSC2, and VHL.  The following genes were evaluated for sequence changes only: SDHA and HOXB13 c.251G>A variant only.  Based on [name]'s personal history of cancer, she meets medical criteria for genetic testing. Despite that she meets criteria, she may still have an out of pocket cost. We discussed that if her out of pocket  cost for testing is over $100, the laboratory will call and confirm whether she wants to proceed with testing.  If the out of pocket cost of testing is less than $100 she will be billed by the genetic testing laboratory.   PLAN: After considering the risks, benefits, and limitations, Ms. Bonanno  provided informed consent to pursue genetic testing and the blood sample was sent to [facility] for analysis of the STAT panel and the common hereditary cancer panel. Results should be available within approximately 7 days time, at which point we will reflex to the common hereditary cancer panel that could take up to an additional 1-2 weeks' time, at which point they will be disclosed by telephone to Ms. Finchum, as will any additional recommendations warranted by these results. Ms. Killilea will receive a summary of her genetic counseling visit and a copy of her results once available. This information will also be available in Epic. We encouraged Ms. Benavides to remain in contact with cancer genetics annually so that we can continuously update the  family history and inform her of any changes in cancer genetics and testing that may be of benefit for her family. Ms. Abad questions were answered to her satisfaction today. Our contact information was provided should additional questions or concerns arise.  Lastly, we encouraged Ms. Breach to remain in contact with cancer genetics annually so that we can continuously update the family history and inform her of any changes in cancer genetics and testing that may be of benefit for this family.   Ms.  Hipp questions were answered to her satisfaction today. Our contact information was provided should additional questions or concerns arise. Thank you for the referral and allowing [place] to share in the care of your patient.   Karen P. Florene Glen, [place], [facility] Certified Genetic Counselor [email or web address] phone: [phone]  The patient was seen for a  total of 60 minutes in face-to-face genetic counseling.  This patient was discussed with Drs. Magrinat, Lindi Adie and/or Burr Medico who agrees with the above.    _______________________________________________________________________ For Office Staff:  Number of people involved in session: 1 Was an Intern/ student involved with case: no

## 2016-11-28 ENCOUNTER — Inpatient Hospital Stay (HOSPITAL_COMMUNITY): Admission: RE | Admit: 2016-11-28 | Payer: Medicaid Other | Source: Ambulatory Visit

## 2016-11-30 ENCOUNTER — Other Ambulatory Visit: Payer: Medicaid Other

## 2016-12-01 ENCOUNTER — Other Ambulatory Visit: Payer: Self-pay | Admitting: General Surgery

## 2016-12-02 ENCOUNTER — Telehealth: Payer: Self-pay

## 2016-12-02 NOTE — Telephone Encounter (Signed)
Called and left a message with post op and chemo ed appts  Karan Ramnauth per 8/24 inbasket

## 2016-12-05 NOTE — Patient Instructions (Signed)
JAMITA MCKELVIN  12/05/2016   Your procedure is scheduled on: 12-14-16  Report to [facility] Main  Entrance Take [place]  elevators to 3rd floor to  [place] at 12:15PM.    Call this number if you have problems the morning of surgery [phone]    Remember: ONLY 1 PERSON MAY GO WITH YOU TO SHORT STAY TO GET  READY MORNING OF [place].  Do not eat food After Midnight. You may have clear liquids from midnight until 815am day of surgery. Nothing by mouth after 815am!!     Take these medicines the morning of surgery with A SIP OF WATER: xanax as needed                                You may not have any metal on your body including hair pins and              piercings  Do not wear jewelry, make-up, lotions, powders or perfumes, deodorant             Do not wear nail polish.  Do not shave  48 hours prior to surgery.              Men may shave face and neck.   Do not bring valuables to the hospital. [place].  Contacts, dentures or bridgework may not be worn into surgery.  Leave suitcase in the car. After surgery it may be brought to your room.               Please read over the following fact sheets you were given: _____________________________________________________________________     CLEAR LIQUID DIET   Foods Allowed                                                                     Foods Excluded  Coffee and tea, regular and decaf                             liquids that you cannot  Plain Jell-O in any flavor                                             see through such as: Fruit ices (not with fruit pulp)                                     milk, soups, orange juice  Iced Popsicles                                    All solid food Carbonated beverages, regular and diet  Cranberry, grape and apple juices Sports drinks like Gatorade Lightly seasoned clear  broth or consume(fat free) Sugar, honey syrup  Sample Menu Breakfast                                Lunch                                     Supper Cranberry juice                    Beef broth                            Chicken broth Jell-O                                     Grape juice                           Apple juice Coffee or tea                        Jell-O                                      Popsicle                                                Coffee or tea                        Coffee or tea  _____________________________________________________________________             [facility] - Preparing for Surgery Before surgery, you can play an important role.  Because skin is not sterile, your skin needs to be as free of germs as possible.  You can reduce the number of germs on your skin by washing with CHG (chlorahexidine gluconate) soap before surgery.  CHG is an antiseptic cleaner which kills germs and bonds with the skin to continue killing germs even after washing. Please DO NOT use if you have an allergy to CHG or antibacterial soaps.  If your skin becomes reddened/irritated stop using the CHG and inform your nurse when you arrive at Short Stay. Do not shave (including legs and underarms) for at least 48 hours prior to the first CHG shower.  You may shave your face/neck. Please follow these instructions carefully:  1.  Shower with CHG Soap the night before surgery and the  morning of Surgery.  2.  If you choose to wash your hair, wash your hair first as usual with your  normal  shampoo.  3.  After you shampoo, rinse your hair and body thoroughly to remove the  shampoo.                           4.  Use CHG as you would any other liquid soap.  You can apply chg  directly  to the skin and wash                       Gently with a scrungie or clean washcloth.  5.  Apply the CHG Soap to your body ONLY FROM THE NECK DOWN.   Do not use on face/ open                            Wound or open sores. Avoid contact with eyes, ears mouth and genitals (private parts).                       Wash face,  Genitals (private parts) with your normal soap.             6.  Wash thoroughly, paying special attention to the area where your surgery  will be performed.  7.  Thoroughly rinse your body with warm water from the neck down.  8.  DO NOT shower/wash with your normal soap after using and rinsing off  the CHG Soap.                9.  Pat yourself dry with a clean towel.            10.  Wear clean pajamas.            11.  Place clean sheets on your bed the night of your first shower and do not  sleep with pets. Day of Surgery : Do not apply any lotions/deodorants the morning of surgery.  Please wear clean clothes to the hospital/surgery center.  FAILURE TO FOLLOW THESE INSTRUCTIONS MAY RESULT IN THE CANCELLATION OF YOUR SURGERY PATIENT SIGNATURE_________________________________  NURSE SIGNATURE__________________________________  ________________________________________________________________________

## 2016-12-05 NOTE — Progress Notes (Signed)
CT chest 11-16-16 epic

## 2016-12-06 ENCOUNTER — Ambulatory Visit (HOSPITAL_COMMUNITY)
Admission: RE | Admit: 2016-12-06 | Discharge: 2016-12-06 | Disposition: A | Discharge status: Home / Self Care | Payer: Medicaid Other | Source: Ambulatory Visit | Attending: Hematology and Oncology | Admitting: Hematology and Oncology

## 2016-12-06 ENCOUNTER — Other Ambulatory Visit (HOSPITAL_COMMUNITY): Payer: Self-pay | Admitting: Emergency Medicine

## 2016-12-06 ENCOUNTER — Encounter (HOSPITAL_COMMUNITY): Payer: Self-pay

## 2016-12-06 ENCOUNTER — Ambulatory Visit (HOSPITAL_COMMUNITY)
Admission: RE | Admit: 2016-12-06 | Discharge: 2016-12-06 | Disposition: A | Discharge status: Home / Self Care | Payer: Medicaid Other | Source: Ambulatory Visit | Attending: General Surgery | Admitting: General Surgery

## 2016-12-06 ENCOUNTER — Other Ambulatory Visit: Payer: Self-pay | Admitting: Hematology and Oncology

## 2016-12-06 ENCOUNTER — Encounter (HOSPITAL_COMMUNITY)
Admission: RE | Admit: 2016-12-06 | Discharge: 2016-12-06 | Disposition: A | Discharge status: Home / Self Care | Payer: Medicaid Other | Source: Ambulatory Visit | Attending: General Surgery | Admitting: General Surgery

## 2016-12-06 DIAGNOSIS — Z01812 Encounter for preprocedural laboratory examination: Secondary | ICD-10-CM | POA: Insufficient documentation

## 2016-12-06 DIAGNOSIS — Z01818 Encounter for other preprocedural examination: Secondary | ICD-10-CM

## 2016-12-06 DIAGNOSIS — R011 Cardiac murmur, unspecified: Secondary | ICD-10-CM | POA: Diagnosis not present

## 2016-12-06 DIAGNOSIS — Z01810 Encounter for preprocedural cardiovascular examination: Secondary | ICD-10-CM | POA: Diagnosis present

## 2016-12-06 HISTORY — DX: Chronic obstructive pulmonary disease, unspecified: J44.9

## 2016-12-06 LAB — COMPREHENSIVE METABOLIC PANEL
ALT: 13 U/L — ABNORMAL LOW (ref 14–54)
AST: 22 U/L (ref 15–41)
Albumin: 4.1 g/dL (ref 3.5–5.0)
Alkaline Phosphatase: 62 U/L (ref 38–126)
Anion gap: 6 (ref 5–15)
BUN: 16 mg/dL (ref 6–20)
CO2: 28 mmol/L (ref 22–32)
Calcium: 9.2 mg/dL (ref 8.9–10.3)
Chloride: 105 mmol/L (ref 101–111)
Creatinine, Ser: 0.92 mg/dL (ref 0.44–1.00)
GFR calc Af Amer: >60 mL/min (ref >60)
GFR calc non Af Amer: >60 mL/min (ref >60)
Glucose, Bld: 106 mg/dL — ABNORMAL HIGH (ref 65–99)
Potassium: 4.1 mmol/L (ref 3.5–5.1)
Sodium: 139 mmol/L (ref 135–145)
Total Bilirubin: 0.3 mg/dL (ref 0.3–1.2)
Total Protein: 7.6 g/dL (ref 6.5–8.1)

## 2016-12-06 LAB — CBC WITH DIFFERENTIAL/PLATELET
Basophils Absolute: 0 10*3/uL (ref 0.0–0.1)
Basophils Relative: 0 %
Eosinophils Absolute: 0.3 10*3/uL (ref 0.0–0.7)
Eosinophils Relative: 3 %
HCT: 39.3 % (ref 36.0–46.0)
Hemoglobin: 12.8 g/dL (ref 12.0–15.0)
Lymphocytes Relative: 38 %
Lymphs Abs: 2.8 10*3/uL (ref 0.7–4.0)
MCH: 32.5 pg (ref 26.0–34.0)
MCHC: 32.6 g/dL (ref 30.0–36.0)
MCV: 99.7 fL (ref 78.0–100.0)
Monocytes Absolute: 0.5 10*3/uL (ref 0.1–1.0)
Monocytes Relative: 7 %
Neutro Abs: 3.9 10*3/uL (ref 1.7–7.7)
Neutrophils Relative %: 52 %
Platelets: 289 10*3/uL (ref 150–400)
RBC: 3.94 MIL/uL (ref 3.87–5.11)
RDW: 13.6 % (ref 11.5–15.5)
WBC: 7.5 10*3/uL (ref 4.0–10.5)

## 2016-12-06 LAB — URINALYSIS, ROUTINE W REFLEX MICROSCOPIC
Bilirubin Urine: NEGATIVE
Glucose, UA: NEGATIVE mg/dL
Hgb urine dipstick: NEGATIVE
Ketones, ur: NEGATIVE mg/dL
Nitrite: NEGATIVE
Protein, ur: NEGATIVE mg/dL
Specific Gravity, Urine: 1.027 (ref 1.005–1.030)
pH: 5 (ref 5.0–8.0)

## 2016-12-06 LAB — PREGNANCY, URINE: Preg Test, Ur: NEGATIVE

## 2016-12-06 IMAGING — DX DG CHEST 2V
2 series · 2 of 2 positions shown · non-contrast
Comparison: Chest x-ray of [DATE] and chest CT scan of
[DATE].

CLINICAL DATA: Preoperative examination prior to left mastectomy.

EXAM:
CHEST  2 VIEW

[chest pa]
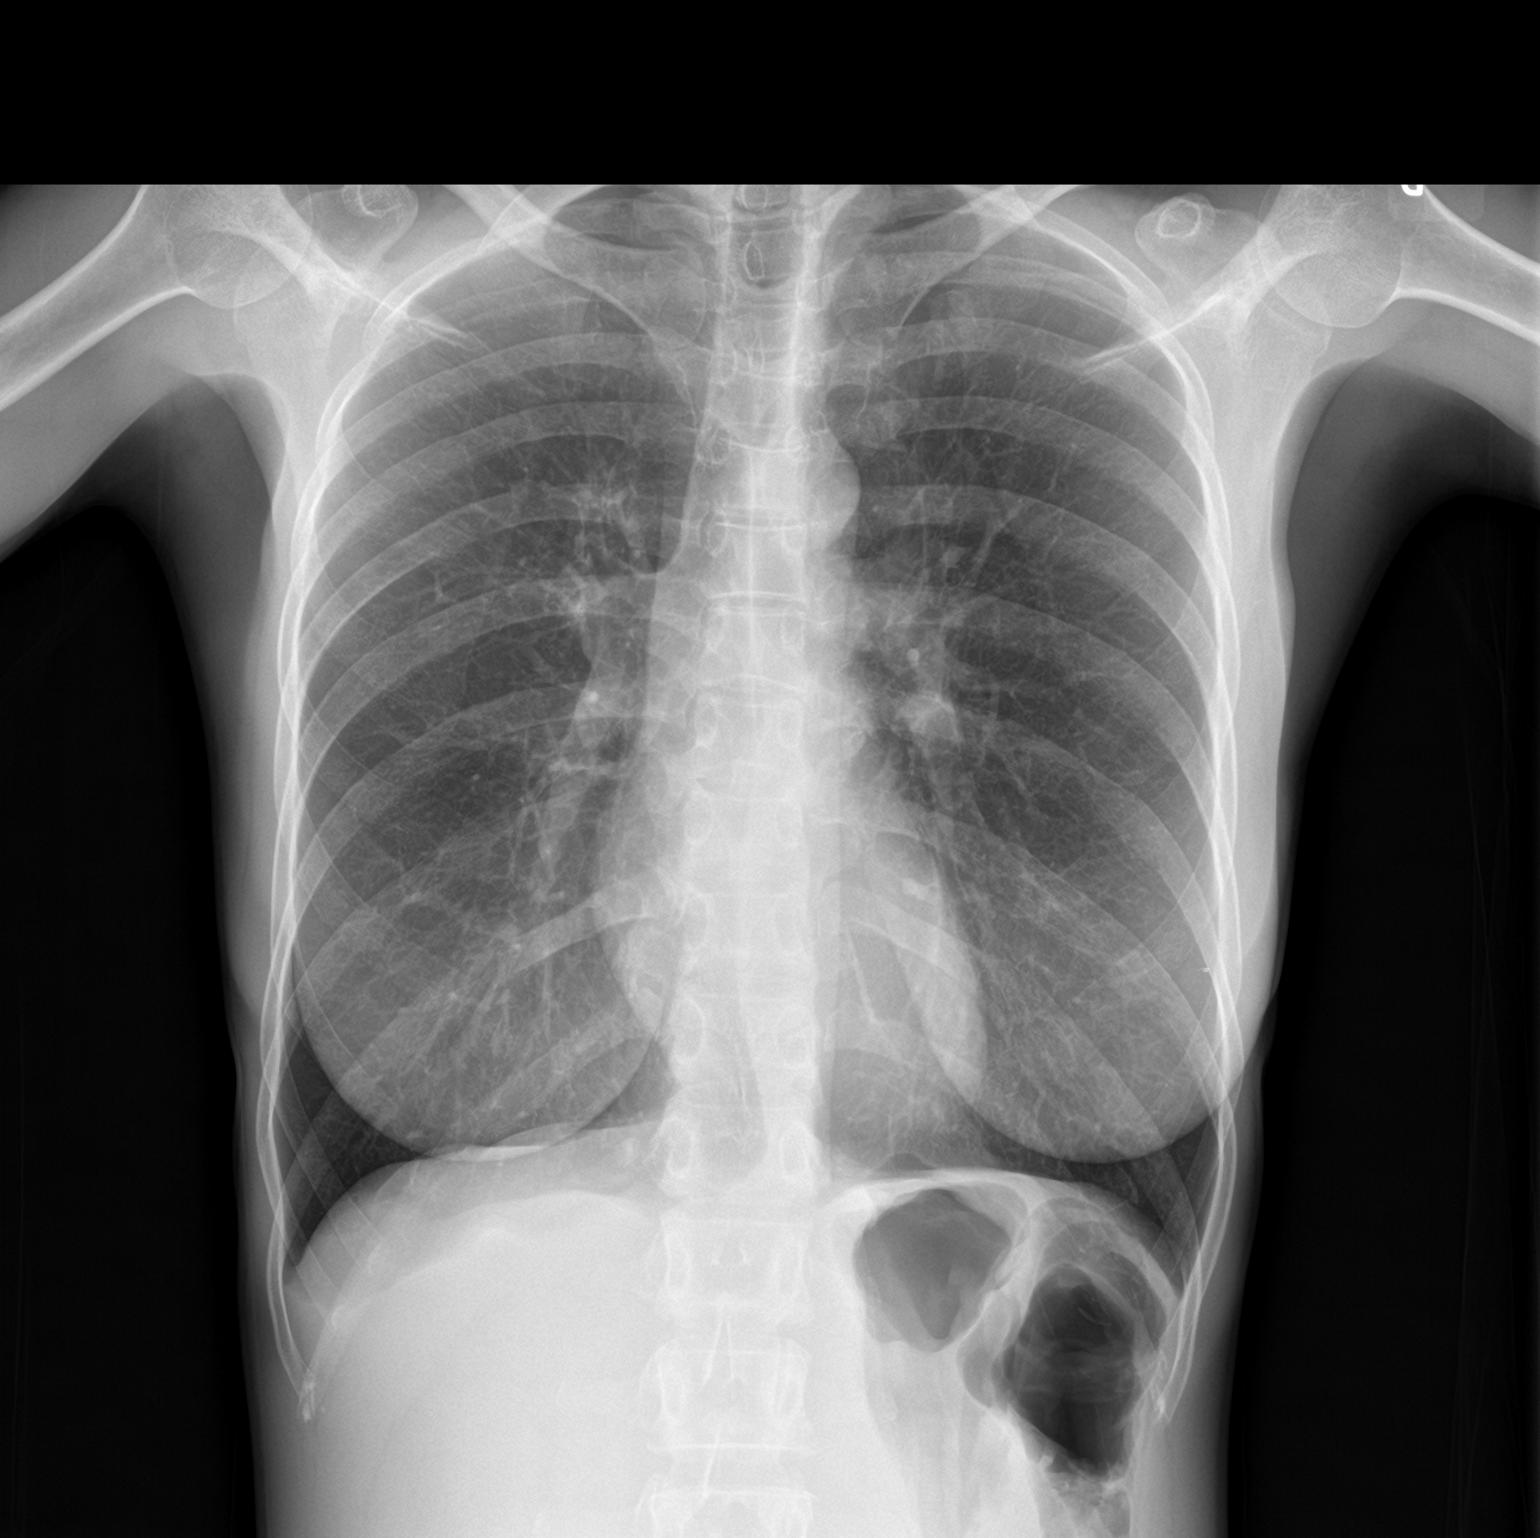

[chest lat]
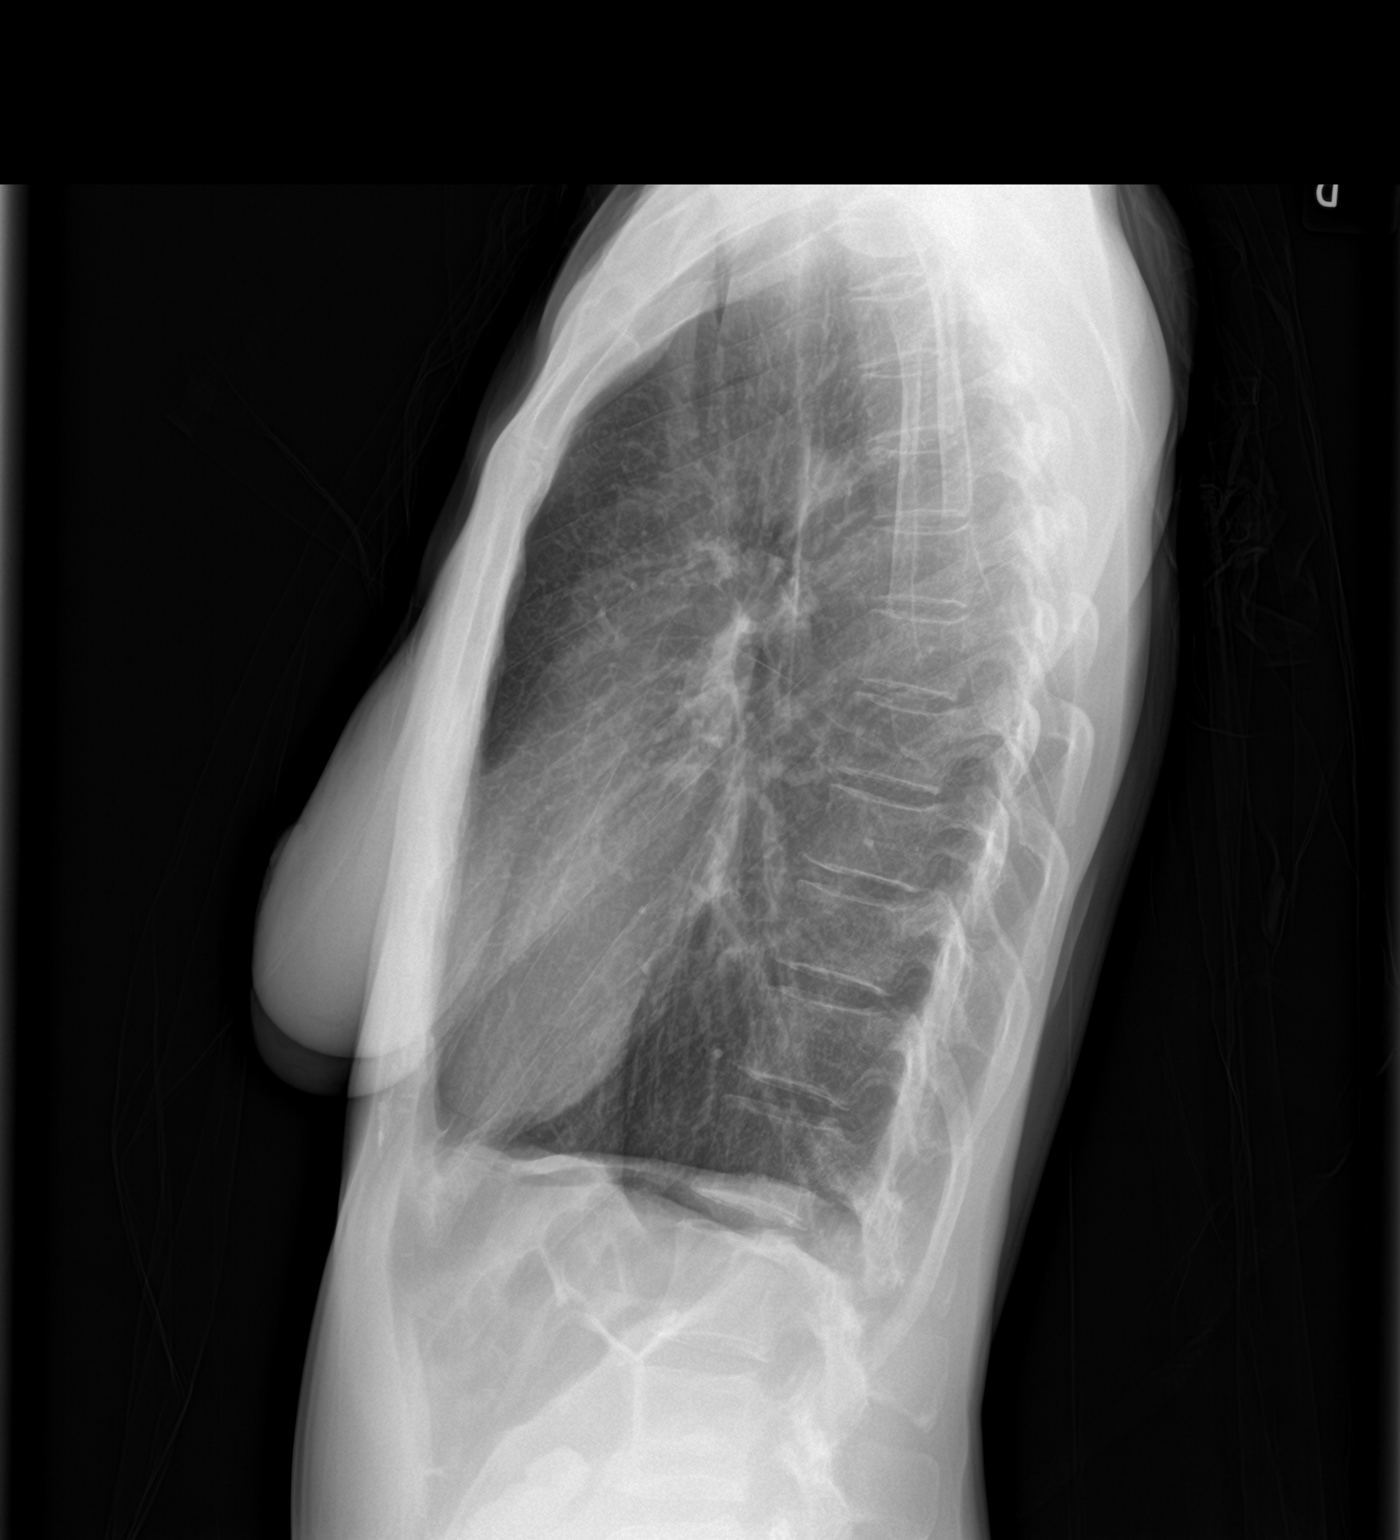

[2 of 2 positions shown; findings below may reference images not displayed]

FINDINGS: The lungs are hyperinflated with hemidiaphragm flattening. There is
no focal infiltrate. No pulmonary parenchymal nodules or masses are
observed. The heart and pulmonary vascularity are normal. The
mediastinum is normal in width. The trachea is midline. The bony
thorax exhibits no acute abnormality.
IMPRESSION: COPD.  There is no acute cardiopulmonary abnormality.

## 2016-12-06 NOTE — Progress Notes (Signed)
  Echocardiogram 2D Echocardiogram has been performed.  Diane Rosario M 12/06/2016, 11:46 AM

## 2016-12-06 NOTE — Progress Notes (Signed)
Urinalysis result routed via epic to Dr Dalbert Batman

## 2016-12-07 ENCOUNTER — Encounter: Payer: Self-pay | Admitting: Genetic Counselor

## 2016-12-07 ENCOUNTER — Telehealth: Payer: Self-pay | Admitting: Genetic Counselor

## 2016-12-07 DIAGNOSIS — Z1379 Encounter for other screening for genetic and chromosomal anomalies: Secondary | ICD-10-CM | POA: Insufficient documentation

## 2016-12-07 NOTE — Progress Notes (Signed)
ECHO 12-06-16 epic    "Aortic valve:   Trileaflet.  Doppler:   There was no stenosis. There was no regurgitation."

## 2016-12-07 NOTE — Telephone Encounter (Signed)
LM on VM with good news on STAT panel.  Asked that she CB.

## 2016-12-12 NOTE — H&P (Signed)
Diane Rosario Location: [facility] Patient #: [id number] DOB: 08/29/1975 Single / Language: Diane Rosario / Race: Black or African American Female        History of Present Illness       . This is a 41 year old female who returns with her uncle Diane Rosario to discuss and plan definitive surgery for her locally advanced left breast cancer. She was originally seen in the [facility] by Dr. Lindi Adie, Dr. Lisbeth Renshaw, and me. She is followed intermittently at [facility] for primary care.      She has substance abuse problems and does not get regular medical care. She recently noticed something in her left breast about 2 months ago, went to the emergency room, was referred for imaging studies. They found a spiculated mass in the superior left breast for posterior and scattered clusters of calcifications. 5 cm area of tissue involved. Right breast was said to be normal but very high density on both sides. Multiple abnormal lymph nodes on the left axilla. Second breast mass times the upper outer quadrant. 2 breast masses and 2 axillary nodes were biopsied in all 4 biopsy shows grade 2 invasive carcinoma. ER 30%. PR 0. HER-2 positive. Ki-67 20%       We attempted to get an MRI but she could not sit still was very uncomfortable with huge motion artifact. Dr. Lindi Adie and I have decided to forego that. She did keep her up over the genetics and genetic testing was drawn and the results are pending Her uncle Diane Rosario question whether she has should have a prophylactic mastectomy on the right side and we had a long discussion about that. She does not want to do that. Admittedly we are all concerned about compliance with long-term follow-up.        Comorbidities include PTSD. Her husband was murdered. Bipolar on medications. Malnourished with BMI 14. Mother and father living but no medical details      Social history positive for cocaine and marijuana use as well as alcohol and tobacco  abuse. Widowed. Lives with a roommate. Has her uncle Diane Rosario in the area but owns a farm.She is going to stay with her grandmother postop.      We had a long conversation. Basically she will be scheduled for left modified radical mastectomy and Port-A-Cath insertion. I discussed the indications, details, techniques, and numerous risk of the surgery with the patient and her uncle Diane Rosario. They're aware the risk of bleeding, infection, skin necrosis, arm swelling, arm numbness, shoulder disability, pneumothorax, failure to be able to insert the Port-A-Cath. Bilateral attempts. Air embolus and arrhythmia. They seem to understand these issues well. All the questions are answered. They agree with this plan. We talked about recovery, drains, referral to physical therapy.     She  will schedule for the above-described surgery Check genetic testing results when they're available Postop adjuvant chemotherapy to be followed by adjuvant radiation therapy   Allergies  Latex   Medication History  Medications Reconciled ALPRAZolam (0.5MG Tablet, Oral) Active. Haloperidol (10MG Tablet, Oral) Active. Risperidone Microspheres (50MG/2ML Injectable, Intramuscular) Active.  Vitals  Weight: 92.38 lb Height: 66in Body Surface Area: 1.44 m Body Mass Index: 14.91 kg/m  Temp.: 97.36F  Pulse: 77 (Regular)  P.OX: 99% (Room air) BP: 96/62 (Sitting, Left Arm, Standard)    Physical Exam General Mental Status-Alert. General Appearance-Not in acute distress. Build & Nutrition-Well nourished. Posture-Normal posture. Gait-Normal. Note: Thin. No body fat. Weight 92 pounds.   Head and Neck  Head-normocephalic, atraumatic with no lesions or palpable masses. Trachea-midline. Thyroid Gland Characteristics - normal size and consistency and no palpable nodules.  Chest and Lung Exam Chest and lung exam reveals -on auscultation, normal breath sounds, no adventitious sounds and  normal vocal resonance. Note: Clavicles are small but do not show any deformity.   Breast Note: Pendulous but not large. In the left breast centrally and laterally there is a 5 cm palpable mass that is quite mobile. Skin not involved. High in the upper outer quadrant near the lateral border of the pectoralis major muscle there is a 3 cm mass. Not obviously invading the muscle. Mobile. Multiple mobile palpable left axillary lymph nodes that feel pathologic. No mass right breast No adenopathy right axilla   Cardiovascular Cardiovascular examination reveals -normal heart sounds, regular rate and rhythm with no murmurs and femoral artery auscultation bilaterally reveals normal pulses, no bruits, no thrills.  Abdomen Inspection Inspection of the abdomen reveals - No Hernias. Palpation/Percussion Palpation and Percussion of the abdomen reveal - Soft, Non Tender, No Rigidity (guarding), No hepatosplenomegaly and No Palpable abdominal masses.  Neurologic Neurologic evaluation reveals -alert and oriented x 3 with no impairment of recent or remote memory, normal attention span and ability to concentrate, normal sensation and normal coordination.  Neuropsychiatric Note: Alert. Pleasant. Cooperative. Flat and affect. Not very talkative. Has a reasonable insight and is competent to sign her own permits and understands the surgical techniques and the risks of having surgery.   Musculoskeletal Normal Exam - Bilateral-Upper Extremity Strength Normal and Lower Extremity Strength Normal.    Assessment & Plan  CANCER OF OVERLAPPING SITES OF LEFT BREAST (C50.812)   The MRI could not be done as you were extremely uncomfortable. Do not worry about that Dr. Lindi Adie and I have decided that we can forego that x-ray   You underwent genetic testing and those results are pending  You're Diane Rosario raised the question as to whether you should undergo a prophylactic right mastectomy That is  optional, but does not offer any survival benefit. I do not encourage this. 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 Dalbert Batman, M.D., [facility] General and Minimally invasive Surgery Breast and Colorectal Surgery Office:   [phone] Pager:   [phone]

## 2016-12-13 ENCOUNTER — Ambulatory Visit: Payer: Self-pay | Admitting: Genetic Counselor

## 2016-12-13 DIAGNOSIS — C50412 Malignant neoplasm of upper-outer quadrant of left female breast: Secondary | ICD-10-CM

## 2016-12-13 DIAGNOSIS — Z170 Estrogen receptor positive status [ER+]: Secondary | ICD-10-CM

## 2016-12-13 DIAGNOSIS — Z1379 Encounter for other screening for genetic and chromosomal anomalies: Secondary | ICD-10-CM

## 2016-12-13 NOTE — Progress Notes (Signed)
HPI: Ms. Riederer was previously seen in the [place] clinic due to a personal history of cancer and concerns regarding a hereditary predisposition to cancer. Please refer to our prior cancer genetics clinic note for more information regarding Ms. Viscuso's medical, social and family histories, and our assessment and recommendations, at the time. Ms. Rogalski recent genetic test results were disclosed to her, as were recommendations warranted by these results. These results and recommendations are discussed in more detail below.  CANCER HISTORY:    Malignant neoplasm of upper-outer quadrant of left breast in female, estrogen receptor positive ([place])   11/01/2016 Initial Diagnosis    Palpable left breast mass with 4 enl axillary lymph nodes 4.8 x 0.8 x 3.1 cm; left axillary mass 1.1 x 0.5 cm, lymph node 1.4 x 0.8 cm biopsy grade 2-3 IDC ER 30% PR 0% HER-2 positive ratio 7.53, Ki-67 20%, T2 N2 MX stage IIIa clinical stage      12/02/2016 Genetic Testing    Negative genetic testing on the STAT panel.  The STAT Breast cancer panel offered by [facility] includes sequencing and rearrangement analysis for the following 9 genes:  ATM, BRCA1, BRCA2, CDH1, CHEK2, PALB2, PTEN, STK11 and TP53.   The report date is December 02, 2016.       FAMILY HISTORY:  We obtained a detailed, 4-generation family history.  Significant diagnoses are listed below: Family History  Problem Relation Age of Onset  . Hypertension Maternal Aunt   . Hypertension Maternal Grandmother   . Cirrhosis Father   . Cancer Father        unknown type    The patient has one son who is cancer free.  She has two sisters who are cancer free.  Her father is deceased.  He had cirrhosis of the liver and possibly a cancer.  He had 15-16 siblings.  The patient is not aware that anyone had cancer.  Her mother is living.  She had a TAH-BSO after the patient was born for unknown reasons.  She has a full sister and a maternal half  sister. Neither have a history of cancer.  The patient's maternal grandmother is living and healthy.  Her grandfather is deceased.  Ms. Yano is unaware of previous family history of genetic testing for hereditary cancer risks. Patient's maternal ancestors are of African American descent, and paternal ancestors are of African American descent. There is no reported Ashkenazi Jewish ancestry. There is no known consanguinity.  GENETIC TEST RESULTS: Genetic testing reported out on December 02, 2016 through the 9-gene STAT cancer panel found no deleterious mutations.  The STAT Breast cancer panel offered by [facility] includes sequencing and rearrangement analysis for the following 9 genes:  ATM, BRCA1, BRCA2, CDH1, CHEK2, PALB2, PTEN, STK11 and TP53.   The test report has been scanned into EPIC and is located under the Molecular Pathology section of the Results Review tab.   We discussed with Ms. Sellick that since the current genetic testing is not perfect, it is possible there may be a gene mutation in one of these genes that current testing cannot detect, but that chance is small. We also discussed, that it is possible that another gene that has not yet been discovered, or that we have not yet tested, is responsible for the cancer diagnoses in the family, and it is, therefore, important to remain in touch with cancer genetics in the future so that we can continue to offer Ms. Henricksen the most up to date  genetic testing.      ADDITIONAL GENETIC TESTING: We discussed with Ms. Chai that there are other genes that are associated with increased cancer risk that can be analyzed. The laboratories that offer such testing look at these additional genes via a hereditary cancer gene panel. Should Ms. Rodick wish to pursue additional genetic testing, we are happy to discuss and coordinate this testing, at any time.    CANCER SCREENING RECOMMENDATIONS:This result is reassuring and indicates that Ms. Ratay  likely does not have an increased risk for a future cancer due to a mutation in one of these genes. This normal test also suggests that Ms. Reineck's cancer was most likely not due to an inherited predisposition associated with one of these genes.  Most cancers happen by chance and this negative test suggests that her cancer falls into this category.  We, therefore, recommended she continue to follow the cancer management and screening guidelines provided by her oncology and primary healthcare provider.   RECOMMENDATIONS FOR FAMILY MEMBERS: Women in this family might be at some increased risk of developing cancer, over the general population risk, simply due to the family history of cancer. We recommended women in this family have a yearly mammogram beginning at age 41, or 110 years younger than the earliest onset of cancer, an annual clinical breast exam, and perform monthly breast self-exams. Women in this family should also have a gynecological exam as recommended by their primary provider. All family members should have a colonoscopy by age 66.  FOLLOW-UP: Lastly, we discussed with Ms. Bottomley that cancer genetics is a rapidly advancing field and it is possible that new genetic tests will be appropriate for her and/or her family members in the future. We encouraged her to remain in contact with cancer genetics on an annual basis so we can update her personal and family histories and let her know of advances in cancer genetics that may benefit this family.   Our contact number was provided. Ms. Hueber questions were answered to her satisfaction, and she knows she is welcome to call us at anytime with additional questions or concerns.   Roma Kayser, MS, [facility] Certified Genetic Counselor [email or web address]

## 2016-12-13 NOTE — Telephone Encounter (Signed)
Revealed negative genetic testing.  Discussed that we do not know why she has breast cancer or why there is cancer in the family. It could be due to a different gene that we are not testing, or maybe our current technology may not be able to pick something up.  It will be important for her to keep in contact with genetics to keep up with whether additional testing may be needed.  Patient declined reflexing to a larger cancer panel.

## 2016-12-14 ENCOUNTER — Ambulatory Visit (HOSPITAL_COMMUNITY): Payer: Medicaid Other

## 2016-12-14 ENCOUNTER — Ambulatory Visit (HOSPITAL_COMMUNITY)
Admission: RE | Admit: 2016-12-14 | Discharge: 2016-12-16 | Disposition: A | Discharge status: Home / Self Care | Payer: Medicaid Other | Source: Ambulatory Visit | Attending: General Surgery | Admitting: General Surgery

## 2016-12-14 ENCOUNTER — Encounter (HOSPITAL_COMMUNITY)
Admission: RE | Disposition: A | Discharge status: Home / Self Care | Payer: Self-pay | Source: Ambulatory Visit | Attending: General Surgery

## 2016-12-14 ENCOUNTER — Encounter (HOSPITAL_COMMUNITY): Payer: Self-pay | Admitting: Certified Registered"

## 2016-12-14 ENCOUNTER — Ambulatory Visit (HOSPITAL_COMMUNITY): Payer: Medicaid Other | Admitting: Certified Registered"

## 2016-12-14 DIAGNOSIS — F17200 Nicotine dependence, unspecified, uncomplicated: Secondary | ICD-10-CM | POA: Insufficient documentation

## 2016-12-14 DIAGNOSIS — C773 Secondary and unspecified malignant neoplasm of axilla and upper limb lymph nodes: Secondary | ICD-10-CM | POA: Diagnosis not present

## 2016-12-14 DIAGNOSIS — J449 Chronic obstructive pulmonary disease, unspecified: Secondary | ICD-10-CM | POA: Insufficient documentation

## 2016-12-14 DIAGNOSIS — C50412 Malignant neoplasm of upper-outer quadrant of left female breast: Secondary | ICD-10-CM

## 2016-12-14 DIAGNOSIS — F1010 Alcohol abuse, uncomplicated: Secondary | ICD-10-CM | POA: Diagnosis not present

## 2016-12-14 DIAGNOSIS — E440 Moderate protein-calorie malnutrition: Secondary | ICD-10-CM | POA: Insufficient documentation

## 2016-12-14 DIAGNOSIS — C50912 Malignant neoplasm of unspecified site of left female breast: Secondary | ICD-10-CM | POA: Diagnosis present

## 2016-12-14 DIAGNOSIS — F4310 Post-traumatic stress disorder, unspecified: Secondary | ICD-10-CM | POA: Diagnosis not present

## 2016-12-14 DIAGNOSIS — C50812 Malignant neoplasm of overlapping sites of left female breast: Secondary | ICD-10-CM | POA: Diagnosis present

## 2016-12-14 DIAGNOSIS — F1410 Cocaine abuse, uncomplicated: Secondary | ICD-10-CM | POA: Insufficient documentation

## 2016-12-14 DIAGNOSIS — Z95828 Presence of other vascular implants and grafts: Secondary | ICD-10-CM

## 2016-12-14 DIAGNOSIS — D0512 Intraductal carcinoma in situ of left breast: Secondary | ICD-10-CM | POA: Insufficient documentation

## 2016-12-14 DIAGNOSIS — F319 Bipolar disorder, unspecified: Secondary | ICD-10-CM | POA: Insufficient documentation

## 2016-12-14 DIAGNOSIS — Z170 Estrogen receptor positive status [ER+]: Secondary | ICD-10-CM

## 2016-12-14 HISTORY — PX: PORTACATH PLACEMENT: SHX2246

## 2016-12-14 HISTORY — PX: MASTECTOMY MODIFIED RADICAL: SHX5962

## 2016-12-14 LAB — CREATININE, SERUM
Creatinine, Ser: 0.88 mg/dL (ref 0.44–1.00)
GFR calc Af Amer: >60 mL/min (ref >60)
GFR calc non Af Amer: >60 mL/min (ref >60)

## 2016-12-14 LAB — CBC
HCT: 33.5 % — ABNORMAL LOW (ref 36.0–46.0)
Hemoglobin: 11.1 g/dL — ABNORMAL LOW (ref 12.0–15.0)
MCH: 32.5 pg (ref 26.0–34.0)
MCHC: 33.1 g/dL (ref 30.0–36.0)
MCV: 98 fL (ref 78.0–100.0)
Platelets: 223 10*3/uL (ref 150–400)
RBC: 3.42 MIL/uL — ABNORMAL LOW (ref 3.87–5.11)
RDW: 13.1 % (ref 11.5–15.5)
WBC: 9.2 10*3/uL (ref 4.0–10.5)

## 2016-12-14 LAB — RAPID URINE DRUG SCREEN, HOSP PERFORMED
Amphetamines: NOT DETECTED
Barbiturates: NOT DETECTED
Benzodiazepines: NOT DETECTED
Cocaine: POSITIVE — AB
Opiates: NOT DETECTED
Tetrahydrocannabinol: NOT DETECTED

## 2016-12-14 IMAGING — DX DG CHEST 1V
1 series · 1 of 1 positions shown · non-contrast
Comparison: Two-view chest x-ray [DATE].

CLINICAL DATA: Port-A-Cath placement.  Breast cancer.

EXAM:
CHEST 1 VIEW

[chest ap]
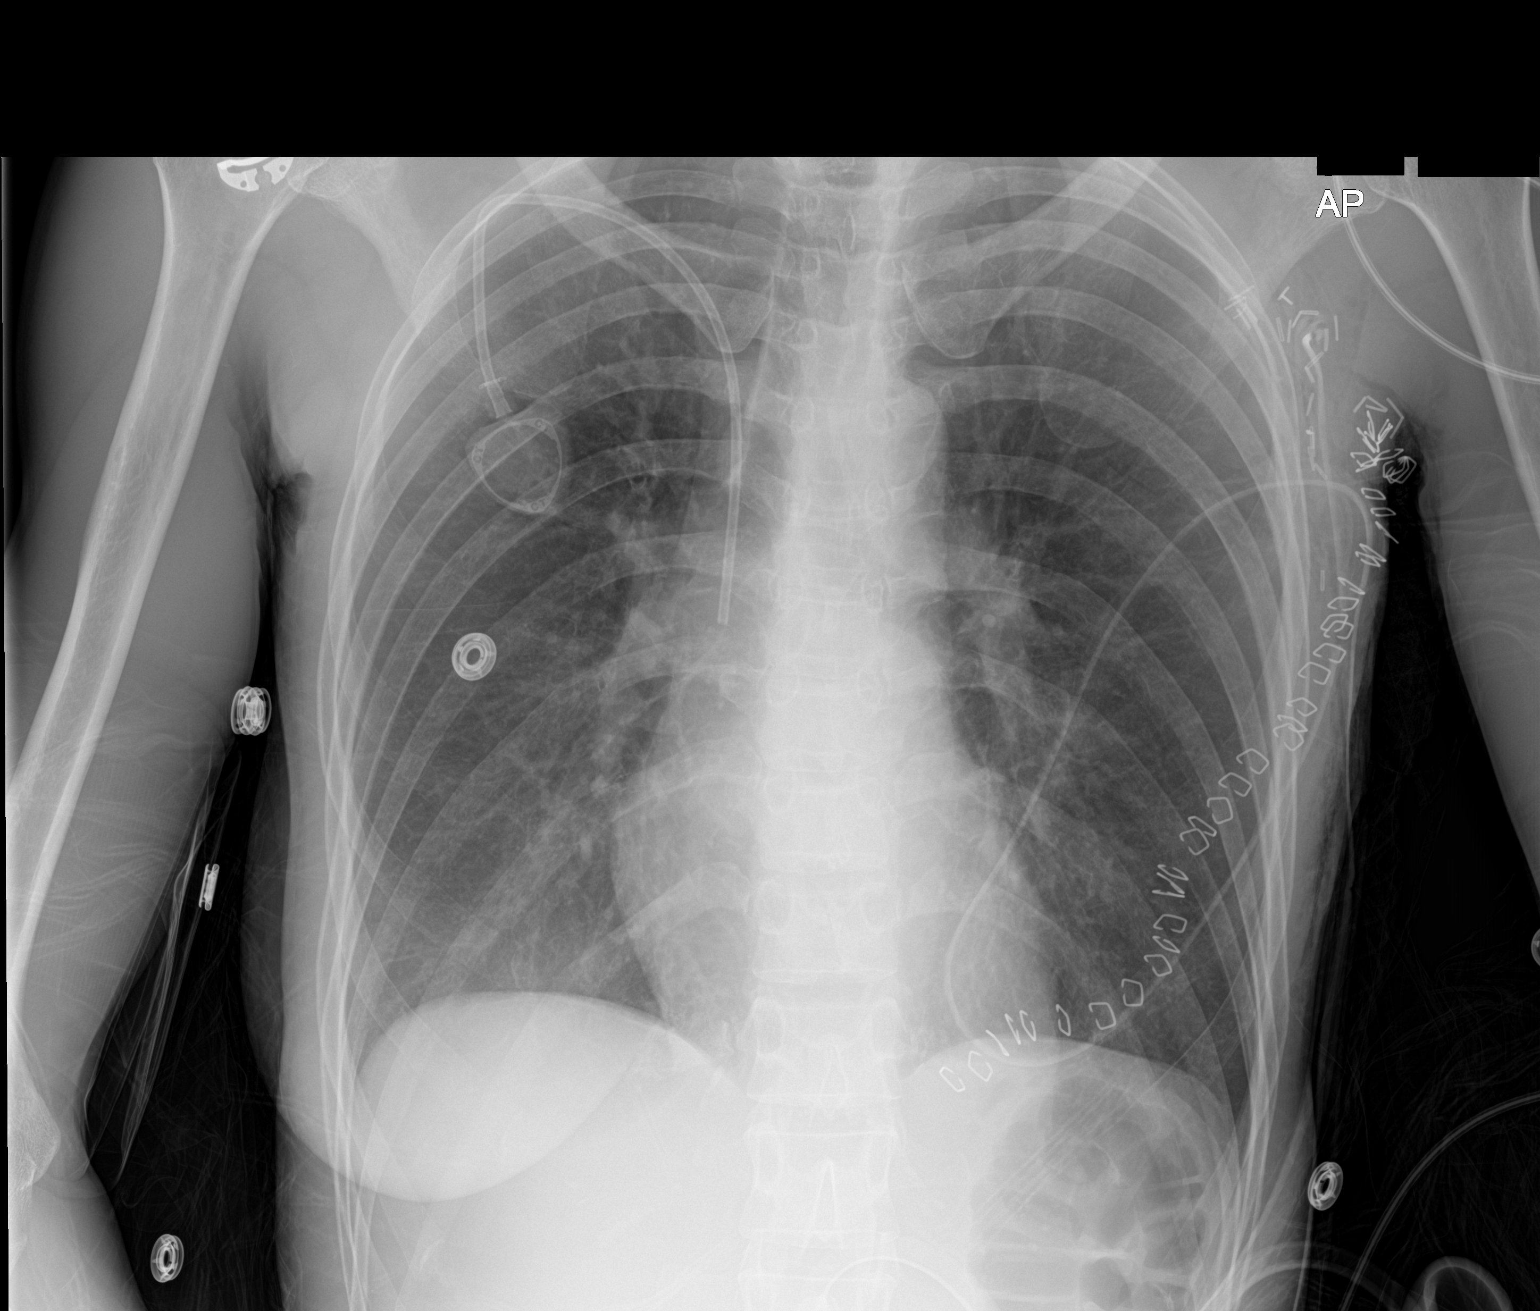

[1 of 1 positions shown; findings below may reference images not displayed]

FINDINGS: Surgical clips are present in the left axilla. Skin staples are
present over the left breast. A drain is in place.

A new right subclavian Port-A-Cath is in place. The tip is in the
mid SVC, above the cavoatrial junction. There is a right angle to
the catheter just below the clavicle on this image.

The heart size is normal. There is no pneumothorax. The lungs are
clear. No edema or effusion is present.
IMPRESSION: 1. Interval placement of right subclavian Port-A-Cath without
evidence for immediate complication.
2. Postsurgical changes of the left breast and axilla.

## 2016-12-14 SURGERY — MASTECTOMY, MODIFIED RADICAL
Anesthesia: General | Site: Breast | Laterality: Right

## 2016-12-14 MED ORDER — BUPIVACAINE-EPINEPHRINE (PF) 0.5% -1:200000 IJ SOLN
INTRAMUSCULAR | Status: AC
  Filled 2016-12-14: qty 60

## 2016-12-14 MED ORDER — CEFAZOLIN SODIUM-DEXTROSE 2-4 GM/100ML-% IV SOLN
2.0000 g | INTRAVENOUS | Status: AC
  Administered 2016-12-14: 2 g via INTRAVENOUS
  Filled 2016-12-14: qty 100

## 2016-12-14 MED ORDER — DEXAMETHASONE SODIUM PHOSPHATE 10 MG/ML IJ SOLN
INTRAMUSCULAR | Status: DC | PRN
  Administered 2016-12-14: 4 mg via INTRAVENOUS

## 2016-12-14 MED ORDER — GABAPENTIN 100 MG PO CAPS
200.0000 mg | ORAL_CAPSULE | ORAL | Status: AC
  Administered 2016-12-14: 200 mg via ORAL
  Filled 2016-12-14: qty 2

## 2016-12-14 MED ORDER — MIDAZOLAM HCL 2 MG/2ML IJ SOLN
INTRAMUSCULAR | Status: AC
  Filled 2016-12-14: qty 2

## 2016-12-14 MED ORDER — CELECOXIB 100 MG PO CAPS
100.0000 mg | ORAL_CAPSULE | ORAL | Status: AC
  Administered 2016-12-14: 100 mg via ORAL
  Filled 2016-12-14: qty 1

## 2016-12-14 MED ORDER — MIDAZOLAM HCL 2 MG/2ML IJ SOLN
2.0000 mg | Freq: Once | INTRAMUSCULAR | Status: AC
  Administered 2016-12-14: 2 mg via INTRAVENOUS

## 2016-12-14 MED ORDER — LIDOCAINE 2% (20 MG/ML) 5 ML SYRINGE
INTRAMUSCULAR | Status: AC
  Filled 2016-12-14: qty 5

## 2016-12-14 MED ORDER — DEXAMETHASONE SODIUM PHOSPHATE 10 MG/ML IJ SOLN
INTRAMUSCULAR | Status: AC
  Filled 2016-12-14: qty 1

## 2016-12-14 MED ORDER — HEPARIN SOD (PORK) LOCK FLUSH 100 UNIT/ML IV SOLN
INTRAVENOUS | Status: AC
  Filled 2016-12-14: qty 5

## 2016-12-14 MED ORDER — FENTANYL CITRATE (PF) 250 MCG/5ML IJ SOLN
INTRAMUSCULAR | Status: AC
  Filled 2016-12-14: qty 5

## 2016-12-14 MED ORDER — LACTATED RINGERS IV SOLN
INTRAVENOUS | Status: DC
  Administered 2016-12-15 – 2016-12-16 (×3): via INTRAVENOUS
  Filled 2016-12-14 (×7): qty 1000

## 2016-12-14 MED ORDER — ROPIVACAINE HCL 5 MG/ML IJ SOLN
INTRAMUSCULAR | Status: DC | PRN
  Administered 2016-12-14: 25 mL

## 2016-12-14 MED ORDER — ENOXAPARIN SODIUM 40 MG/0.4ML ~~LOC~~ SOLN
40.0000 mg | SUBCUTANEOUS | Status: DC
  Administered 2016-12-15: 40 mg via SUBCUTANEOUS
  Filled 2016-12-14: qty 0.4

## 2016-12-14 MED ORDER — TRAMADOL HCL 50 MG PO TABS
50.0000 mg | ORAL_TABLET | Freq: Four times a day (QID) | ORAL | Status: DC | PRN
Start: 2016-12-14 — End: 2016-12-16

## 2016-12-14 MED ORDER — PROPOFOL 10 MG/ML IV BOLUS
INTRAVENOUS | Status: AC
  Filled 2016-12-14: qty 20

## 2016-12-14 MED ORDER — LACTATED RINGERS IV SOLN
INTRAVENOUS | Status: DC
  Administered 2016-12-14: 13:00:00 via INTRAVENOUS
  Administered 2016-12-14: 1000 mL via INTRAVENOUS

## 2016-12-14 MED ORDER — CHLORHEXIDINE GLUCONATE CLOTH 2 % EX PADS: 6.0000 | MEDICATED_PAD | Freq: Once | CUTANEOUS | Status: DC

## 2016-12-14 MED ORDER — ENSURE ENLIVE PO LIQD: 237.0000 mL | Freq: Every day | ORAL | Status: DC

## 2016-12-14 MED ORDER — SODIUM CHLORIDE 0.9 % IV SOLN
Freq: Once | INTRAVENOUS | Status: AC
  Administered 2016-12-14: 16:00:00
  Filled 2016-12-14: qty 1.2

## 2016-12-14 MED ORDER — BUPIVACAINE-EPINEPHRINE 0.5% -1:200000 IJ SOLN
INTRAMUSCULAR | Status: DC | PRN
  Administered 2016-12-14: 7 mL

## 2016-12-14 MED ORDER — KETAMINE HCL 10 MG/ML IJ SOLN
INTRAMUSCULAR | Status: AC
  Filled 2016-12-14: qty 1

## 2016-12-14 MED ORDER — HYDROMORPHONE HCL-NACL 0.5-0.9 MG/ML-% IV SOSY
0.2500 mg | PREFILLED_SYRINGE | INTRAVENOUS | Status: DC | PRN
  Administered 2016-12-14 (×4): 0.5 mg via INTRAVENOUS

## 2016-12-14 MED ORDER — PROMETHAZINE HCL 25 MG/ML IJ SOLN: 6.2500 mg | INTRAMUSCULAR | Status: DC | PRN

## 2016-12-14 MED ORDER — HYDROMORPHONE HCL-NACL 0.5-0.9 MG/ML-% IV SOSY
PREFILLED_SYRINGE | INTRAVENOUS | Status: AC
  Administered 2016-12-14: 0.5 mg via INTRAVENOUS
  Filled 2016-12-14: qty 4

## 2016-12-14 MED ORDER — CEFAZOLIN SODIUM-DEXTROSE 2-4 GM/100ML-% IV SOLN
2.0000 g | Freq: Three times a day (TID) | INTRAVENOUS | Status: AC
  Administered 2016-12-14: 2 g via INTRAVENOUS
  Filled 2016-12-14: qty 100

## 2016-12-14 MED ORDER — METHOCARBAMOL 500 MG PO TABS
500.0000 mg | ORAL_TABLET | Freq: Four times a day (QID) | ORAL | Status: DC | PRN
  Administered 2016-12-16: 500 mg via ORAL
  Filled 2016-12-14: qty 1

## 2016-12-14 MED ORDER — ACETAMINOPHEN 500 MG PO TABS
1000.0000 mg | ORAL_TABLET | ORAL | Status: AC
  Administered 2016-12-14: 1000 mg via ORAL
  Filled 2016-12-14: qty 2

## 2016-12-14 MED ORDER — ONDANSETRON HCL 4 MG/2ML IJ SOLN: 4.0000 mg | Freq: Four times a day (QID) | INTRAMUSCULAR | Status: DC | PRN

## 2016-12-14 MED ORDER — RISPERIDONE MICROSPHERES 50 MG IM SUSR: 50.0000 mg | INTRAMUSCULAR | Status: DC

## 2016-12-14 MED ORDER — HALOPERIDOL 5 MG PO TABS
10.0000 mg | ORAL_TABLET | Freq: Every day | ORAL | Status: DC
  Administered 2016-12-14 – 2016-12-15 (×2): 10 mg via ORAL
  Filled 2016-12-14 (×3): qty 2

## 2016-12-14 MED ORDER — EPHEDRINE SULFATE-NACL 50-0.9 MG/10ML-% IV SOSY
PREFILLED_SYRINGE | INTRAVENOUS | Status: DC | PRN
  Administered 2016-12-14: 10 mg via INTRAVENOUS
  Administered 2016-12-14 (×2): 5 mg via INTRAVENOUS
  Administered 2016-12-14: 10 mg via INTRAVENOUS

## 2016-12-14 MED ORDER — HYDROMORPHONE HCL-NACL 0.5-0.9 MG/ML-% IV SOSY
0.5000 mg | PREFILLED_SYRINGE | INTRAVENOUS | Status: DC | PRN
  Administered 2016-12-14 – 2016-12-15 (×5): 0.5 mg via INTRAVENOUS
  Filled 2016-12-14 (×7): qty 1

## 2016-12-14 MED ORDER — ALPRAZOLAM 0.5 MG PO TABS: 0.5000 mg | ORAL_TABLET | Freq: Two times a day (BID) | ORAL | Status: DC | PRN

## 2016-12-14 MED ORDER — PANTOPRAZOLE SODIUM 40 MG IV SOLR
40.0000 mg | Freq: Every day | INTRAVENOUS | Status: DC
  Administered 2016-12-14 – 2016-12-15 (×2): 40 mg via INTRAVENOUS
  Filled 2016-12-14 (×2): qty 40

## 2016-12-14 MED ORDER — ONDANSETRON HCL 4 MG/2ML IJ SOLN
INTRAMUSCULAR | Status: DC | PRN
  Administered 2016-12-14: 4 mg via INTRAVENOUS

## 2016-12-14 MED ORDER — FENTANYL CITRATE (PF) 100 MCG/2ML IJ SOLN
100.0000 ug | Freq: Once | INTRAMUSCULAR | Status: AC
  Administered 2016-12-14: 50 ug via INTRAVENOUS

## 2016-12-14 MED ORDER — HYDROCODONE-ACETAMINOPHEN 5-325 MG PO TABS
1.0000 | ORAL_TABLET | ORAL | Status: DC | PRN
  Administered 2016-12-16 (×2): 2 via ORAL
  Filled 2016-12-14 (×2): qty 2

## 2016-12-14 MED ORDER — HEPARIN SOD (PORK) LOCK FLUSH 100 UNIT/ML IV SOLN
INTRAVENOUS | Status: DC | PRN
  Administered 2016-12-14: 500 [IU]

## 2016-12-14 MED ORDER — ONDANSETRON 4 MG PO TBDP: 4.0000 mg | ORAL_TABLET | Freq: Four times a day (QID) | ORAL | Status: DC | PRN

## 2016-12-14 MED ORDER — ONDANSETRON HCL 4 MG/2ML IJ SOLN
INTRAMUSCULAR | Status: AC
  Filled 2016-12-14: qty 2

## 2016-12-14 MED ORDER — FENTANYL CITRATE (PF) 100 MCG/2ML IJ SOLN
INTRAMUSCULAR | Status: DC | PRN
  Administered 2016-12-14 (×4): 50 ug via INTRAVENOUS

## 2016-12-14 MED ORDER — SENNA 8.6 MG PO TABS
1.0000 | ORAL_TABLET | Freq: Two times a day (BID) | ORAL | Status: DC
  Administered 2016-12-14 – 2016-12-15 (×3): 8.6 mg via ORAL
  Filled 2016-12-14 (×4): qty 1

## 2016-12-14 MED ORDER — 0.9 % SODIUM CHLORIDE (POUR BTL) OPTIME
TOPICAL | Status: DC | PRN
  Administered 2016-12-14: 1000 mL

## 2016-12-14 MED ORDER — LIDOCAINE 2% (20 MG/ML) 5 ML SYRINGE
INTRAMUSCULAR | Status: DC | PRN
  Administered 2016-12-14: 1.5 mg/kg/h via INTRAVENOUS

## 2016-12-14 SURGICAL SUPPLY — 50 items
APPLIER CLIP 11 MED OPEN (CLIP) ×8
BAG DECANTER FOR FLEXI CONT (MISCELLANEOUS) ×4 IMPLANT
BANDAGE ACE 6X5 VEL STRL LF (GAUZE/BANDAGES/DRESSINGS) IMPLANT
BINDER BREAST LRG (GAUZE/BANDAGES/DRESSINGS) ×4 IMPLANT
BLADE SURG 15 STRL LF DISP TIS (BLADE) ×2 IMPLANT
BLADE SURG 15 STRL SS (BLADE) ×2
CHLORAPREP W/TINT 26ML (MISCELLANEOUS) ×8 IMPLANT
CLIP APPLIE 11 MED OPEN (CLIP) ×4 IMPLANT
DECANTER SPIKE VIAL GLASS SM (MISCELLANEOUS) IMPLANT
DERMABOND ADVANCED (GAUZE/BANDAGES/DRESSINGS) ×2
DERMABOND ADVANCED .7 DNX12 (GAUZE/BANDAGES/DRESSINGS) ×2 IMPLANT
DRAIN CHANNEL 19F RND (DRAIN) ×8 IMPLANT
DRAPE C-ARM 42X120 X-RAY (DRAPES) ×4 IMPLANT
DRAPE LAPAROSCOPIC ABDOMINAL (DRAPES) ×4 IMPLANT
DRAPE LAPAROTOMY TRNSV 102X78 (DRAPE) ×4 IMPLANT
DRAPE SHEET LG 3/4 BI-LAMINATE (DRAPES) IMPLANT
DRSG EMULSION OIL 3X16 NADH (GAUZE/BANDAGES/DRESSINGS) ×8 IMPLANT
DRSG PAD ABDOMINAL 8X10 ST (GAUZE/BANDAGES/DRESSINGS) ×12 IMPLANT
ELECT BLADE TIP CTD 4 INCH (ELECTRODE) ×4 IMPLANT
ELECT PENCIL ROCKER SW 15FT (MISCELLANEOUS) ×4 IMPLANT
ELECT REM PT RETURN 15FT ADLT (MISCELLANEOUS) ×4 IMPLANT
EVACUATOR SILICONE 100CC (DRAIN) ×8 IMPLANT
GAUZE SPONGE 4X4 12PLY STRL (GAUZE/BANDAGES/DRESSINGS) IMPLANT
GAUZE SPONGE 4X4 16PLY XRAY LF (GAUZE/BANDAGES/DRESSINGS) ×4 IMPLANT
GLOVE EUDERMIC 7 POWDERFREE (GLOVE) ×8 IMPLANT
GOWN STRL REUS W/TWL XL LVL3 (GOWN DISPOSABLE) ×8 IMPLANT
KIT BASIN OR (CUSTOM PROCEDURE TRAY) ×8 IMPLANT
KIT MARKER MARGIN INK (KITS) IMPLANT
KIT PORT POWER 8FR ISP CVUE (Miscellaneous) ×4 IMPLANT
NEEDLE HYPO 22GX1.5 SAFETY (NEEDLE) ×4 IMPLANT
PACK BASIC VI WITH GOWN DISP (CUSTOM PROCEDURE TRAY) ×4 IMPLANT
PACK GENERAL/GYN (CUSTOM PROCEDURE TRAY) ×4 IMPLANT
PACK UNIVERSAL I (CUSTOM PROCEDURE TRAY) IMPLANT
PAD ABD 8X10 STRL (GAUZE/BANDAGES/DRESSINGS) ×12 IMPLANT
SPONGE DRAIN TRACH 4X4 STRL 2S (GAUZE/BANDAGES/DRESSINGS) ×4 IMPLANT
SPONGE LAP 4X18 X RAY DECT (DISPOSABLE) ×4 IMPLANT
STAPLER VISISTAT 35W (STAPLE) ×8 IMPLANT
SUT ETHILON 3 0 PS 1 (SUTURE) ×20 IMPLANT
SUT MNCRL AB 4-0 PS2 18 (SUTURE) ×8 IMPLANT
SUT PROLENE 2 0 SH DA (SUTURE) ×4 IMPLANT
SUT SILK 2 0 SH (SUTURE) IMPLANT
SUT VIC AB 3-0 54XBRD REEL (SUTURE) ×2 IMPLANT
SUT VIC AB 3-0 BRD 54 (SUTURE) ×2
SUT VIC AB 3-0 SH 18 (SUTURE) ×4 IMPLANT
SUT VIC AB 3-0 SH 27 (SUTURE) ×2
SUT VIC AB 3-0 SH 27X BRD (SUTURE) ×2 IMPLANT
SYR 10ML LL (SYRINGE) ×4 IMPLANT
SYR 20CC LL (SYRINGE) ×4 IMPLANT
TOWEL OR 17X26 10 PK STRL BLUE (TOWEL DISPOSABLE) ×4 IMPLANT
TOWEL OR NON WOVEN STRL DISP B (DISPOSABLE) ×4 IMPLANT

## 2016-12-14 NOTE — Progress Notes (Signed)
Assisted Dr. Rose with left, ultrasound guided, pectoralis block. Side rails up, monitors on throughout procedure. See vital signs in flow sheet. Tolerated Procedure well. 

## 2016-12-14 NOTE — Anesthesia Procedure Notes (Signed)
Anesthesia Regional Block: Pectoralis block   Pre-Anesthetic Checklist: ,, timeout performed, Correct Patient, Correct Site, Correct Laterality, Correct Procedure, Correct Position, site marked, Risks and benefits discussed,  Surgical consent,  Pre-op evaluation,  At surgeon's request and post-op pain management  Laterality: Left  Prep: chloraprep       Needles:  Injection technique: Single-shot  Needle Type: Stimiplex     Needle Length: 9cm      Additional Needles:   Procedures: ultrasound guided,,,,,,,,  Narrative:  Start time: 12/14/2016 1:48 PM End time: 12/14/2016 1:58 PM Injection made incrementally with aspirations every 5 mL.  Performed by: Personally  Anesthesiologist: Hondo Nanda  Additional Notes: Patient tolerated the procedure well without complications

## 2016-12-14 NOTE — Transfer of Care (Signed)
Immediate Anesthesia Transfer of Care Note  Patient: Diane Rosario  Procedure(s) Performed: Procedure(s): LEFT MODIFIED RADICAL MASTECTOMY WITH ERAS PATHWAY (Left) INSERTION PORT-A-CATH WITH ULTRASOUND (Right)  Patient Location: PACU  Anesthesia Type:General  Level of Consciousness: awake, alert  and oriented  Airway & Oxygen Therapy: Patient Spontanous Breathing and Patient connected to face mask oxygen  Post-op Assessment: Report given to RN and Post -op Vital signs reviewed and stable  Post vital signs: Reviewed and stable  Last Vitals:  Vitals:   12/14/16 1405 12/14/16 1410  BP: 92/66 96/68  Pulse: 77 77  Resp: 18 10  Temp:    SpO2: 100% 100%    Last Pain:  Vitals:   12/14/16 1226  TempSrc: Oral      Patients Stated Pain Goal: 4 (48/18/56 3149)  Complications: No apparent anesthesia complications

## 2016-12-14 NOTE — Anesthesia Postprocedure Evaluation (Signed)
Anesthesia Post Note  Patient: MARYBELLA ETHIER  Procedure(s) Performed: Procedure(s) (LRB): LEFT MODIFIED RADICAL MASTECTOMY WITH ERAS PATHWAY (Left) INSERTION PORT-A-CATH WITH ULTRASOUND (Right)     Patient location during evaluation: PACU Anesthesia Type: General Level of consciousness: awake and alert Pain management: pain level controlled Vital Signs Assessment: post-procedure vital signs reviewed and stable Respiratory status: spontaneous breathing, nonlabored ventilation, respiratory function stable and patient connected to nasal cannula oxygen Cardiovascular status: blood pressure returned to baseline and stable Postop Assessment: no signs of nausea or vomiting Anesthetic complications: no    Last Vitals:  Vitals:   12/14/16 1800 12/14/16 1823  BP: (!) 88/62 106/68  Pulse: 66 64  Resp: 12 12  Temp: (!) 36.3 C 36.5 C  SpO2: 99% 99%    Last Pain:  Vitals:   12/14/16 1800  TempSrc:   PainSc: 0-No pain                 Hillary Schwegler S

## 2016-12-14 NOTE — Anesthesia Procedure Notes (Signed)
Anesthesia Procedure Image    

## 2016-12-14 NOTE — Anesthesia Procedure Notes (Signed)
Procedure Name: LMA Insertion Date/Time: 12/14/2016 2:25 PM Performed by: Noralyn Pick D Pre-anesthesia Checklist: Patient identified, Emergency Drugs available, Suction available and Patient being monitored Patient Re-evaluated:Patient Re-evaluated prior to induction Oxygen Delivery Method: Circle system utilized Preoxygenation: Pre-oxygenation with 100% oxygen Induction Type: IV induction Ventilation: Mask ventilation without difficulty LMA: LMA inserted LMA Size: 3.0 Tube type: Oral Number of attempts: 1 Placement Confirmation: positive ETCO2 and breath sounds checked- equal and bilateral Tube secured with: Tape Dental Injury: Teeth and Oropharynx as per pre-operative assessment

## 2016-12-14 NOTE — Op Note (Signed)
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 Dalbert Batman, M.D., FACS  Assistant:                      Dr. Donnie Mesa   Indication for Assistant: locally advanced cancer with complex exposure and retraction required, difficult axillary dissection  Operative Indications:   This is a 41 year old female who presents for elective surgery to treat her locally advanced left breast cancer. She was originally seen in the [facility] by Dr. Lindi Adie, Dr. Lisbeth Renshaw, and me. She is followed intermittently at [facility] for primary care.      She has substance abuse problems and does not get regular medical care. She recently noticed something in her left breast about 2 months ago, went to the emergency room, was referred for imaging studies. They found a spiculated mass in the superior left breast far posterior and scattered clusters of calcifications. 5 cm area of tissue involved. Right breast was said to be normal but very high density on both sides. Multiple abnormal lymph nodes on the left axilla. Second breast mass high in the upper outer quadrant,overlying the lateral border of the pectoralis major muscle. 2 breast masses and 2 axillary nodes were biopsied in all 4 biopsy shows grade 2 invasive carcinoma. ER 30%. PR 0. HER-2 positive. Ki-67 20%       We attempted to get an MRI but she could not sit still was very uncomfortable with huge motion artifact. Dr. Lindi Adie and I have decided to forego  that. She did keep her up over the genetics and genetic testing was drawn and the results are pending Her uncle Juanda Crumble questioned whether she has should have a prophylactic mastectomy on the right side and we had a long discussion about that. She does not want to do that. Admittedly we are all concerned about compliance with long-term follow-up.        Comorbidities include PTSD. Her husband was murdered. Bipolar on medications. Malnourished with BMI 14. Mother and father living but no medical details      Social history positive for cocaine and marijuana use as well as alcohol and tobacco abuse. Widowed. Lives with a roommate. Has her uncle Barnabas Lister in the area but owns a farm.She is supposed  to stay with her grandmother postop.      We had a long  conversation. Basically she will be scheduled for left modified radical mastectomy and Port-A-Cath insertion. I discussed the indications, details, techniques, and numerous risk of the surgery with the patient and her uncle Barnabas Lister.They agree with this plan.   Operative Findings:       There was a large 5 cm mass in the central left breast slightly upper outer but mostly retroareolar. There was a second large mass at very high in the upper outer quadrant almost into the axilla.  This required an oblique incision from inferior medial to superior lateral going all the way to the axilla to completely excise the mass in the breast.we were able to achieve primary wound closure.There are multiple palpably enlarged axillary lymph nodes.  The tumor masses in the breast were not invading the muscle, however we did a complete left axillary lymph node dissection and I removed numerous pathologically enlarged hard lymph nodes.  We preserved the thoracodorsal neurovascular bundle.  The Port-A-Cath was inserted through the right subclavian vein without difficulty.  Procedure in Detail:          Following the induction of general LMA anesthesia intravenous antibodies  were given.  Surgical timeout was performed.  The left breast and axilla were prepped and draped in a sterile fashion.  We studied the topography of the multiple cancers and shows an oblique incision from inferomedial to superolateral.  This incision was drawn with a marking pen and the incision made.  Skin flaps were raised superiorly, medially, in fairly, and laterally the usual anatomic landmarks.  We dissected the breast tissue and the pectoralis fascia off of the pectoralis.  We K carried our dissection up into the axilla.  We identified the axillary vein.  Dissection was somewhat tedious because of fibrosis and desmoplastic reaction.  Multiple lymphatics and venous territories were controlled metal clips and divided.  Ultimately completely cleaned out all the level I and level II lymph nodes preserving the thoracodorsal neurovascular bundle.  The specimen was sent to the lab.  There is no palpable adenopathy at the completion of the case.     The wound was copiously irrigated.  Hemostasis was excellent and achieved with electrocautery and metal clips.  The skin edges were a little tight but closed primarily with nylon sutures and skin staples.  Two 19 French Blake drains were placed,   one up into the axilla and one across the skin flaps and were brought out through separate stab incisions inferolaterally.  The drains were connected to suction bulbs and sutured to the skin with nylon sutures.  Mastectomy incision was closed with multiple interrupted sutures of 3-0 nylon and skin staples.  The wound was cleansed and dried and covered with sterile towels.      The patient was repositioned with her arms tucked at her sides and a small roll behind her shoulders.  The neck entire chest wall were then again prepped and draped in a sterile fashion.  We used new instruments gowns and gloves.  0.5% Marcaine with epinephrine was used as local infiltration anesthetic. A right subclavian venipuncture was performed and I  got good blood return on the first pass.  A guidewire was threaded without difficulty fluoroscopy confirmed the guidewire in the Vena cava.  Using a marking pin I drew a template on the chest wall to guide positioning of the catheter.a transverse incision was made below the midpoint of the clavicle and a subcutaneous pocket was created.  Using a tunneling device I passed the catheter  from the wire insertion site to the port pocket site.  Using the template I drawn on the chest wall I cut the catheter 20 cm in length.  The catheter was secured to the port with the locking device and flushed with heparinized saline.  The port was sutured to the pectoralis fashion with 3 interrupted sutures of 2-0 Prolene.  I threaded the dilator and peel-away sheath over the guidewire into the central circulation.  The guidewire and dilator were removed.  The catheter was threaded and peel-away sheath removed.  The catheter flushed easily and had excellent blood return.  Fluoroscopy confirmed that the tip of the catheter was in the superior vena cava near the right atrium and there is no deformity of the catheter anywhere.  The port and catheter were then flushed with concentrated heparin.  Subcutaneous tissue was closed with 3-0 Vicryl sutures and skin incisions closed with subcuticular 4-0 Monocryl and Dermabond.      The left mastectomy wound was then cleaned and dried and dressed with Adaptic gauze and dry 4 x 4's and ABDs and a breast binder. The patient tolerated the procedure well was taken to PACU in stable condition.  EBL 75-100 mL.  Counts correct.  Complications none.  A portable chest x-ray is planned.     Edsel Petrin. Dalbert Batman, M.D., FACS General and Minimally Invasive Surgery Breast and Colorectal Surgery   Addendum : I logged onto the [facility] website and reviewed her prescription medication history  12/14/2016 4:58 PM

## 2016-12-14 NOTE — Anesthesia Preprocedure Evaluation (Signed)
Anesthesia Evaluation  Patient identified by MRN, date of birth, ID band Patient awake    Reviewed: Allergy & Precautions, NPO status , Patient's Chart, lab work & pertinent test results  Airway Mallampati: II  TM Distance: >3 FB Neck ROM: Full    Dental no notable dental hx.    Pulmonary COPD, Current Smoker,    Pulmonary exam normal breath sounds clear to auscultation       Cardiovascular negative cardio ROS Normal cardiovascular exam Rhythm:Regular Rate:Normal     Neuro/Psych Bipolar Disorder negative neurological ROS     GI/Hepatic negative GI ROS, (+)     substance abuse  cocaine use,   Endo/Other  negative endocrine ROS  Renal/GU negative Renal ROS  negative genitourinary   Musculoskeletal negative musculoskeletal ROS (+)   Abdominal   Peds negative pediatric ROS (+)  Hematology negative hematology ROS (+)   Anesthesia Other Findings   Reproductive/Obstetrics negative OB ROS                             Anesthesia Physical Anesthesia Plan  ASA: III  Anesthesia Plan: General   Post-op Pain Management:  Regional for Post-op pain   Induction: Intravenous  PONV Risk Score and Plan: 2 and Ondansetron and Dexamethasone  Airway Management Planned: Oral ETT  Additional Equipment:   Intra-op Plan:   Post-operative Plan: Extubation in OR  Informed Consent: I have reviewed the patients History and Physical, chart, labs and discussed the procedure including the risks, benefits and alternatives for the proposed anesthesia with the patient or authorized representative who has indicated his/her understanding and acceptance.   Dental advisory given  Plan Discussed with: CRNA and Surgeon  Anesthesia Plan Comments:         Anesthesia Quick Evaluation

## 2016-12-14 NOTE — Interval H&P Note (Signed)
History and Physical Interval Note:  12/14/2016 1:10 PM  Diane Rosario  has presented today for surgery, with the diagnosis of LEFT BREAST CANCER  The various methods of treatment have been discussed with the patient and family. After consideration of risks, benefits and other options for treatment, the patient has consented to  Procedure(s): LEFT MODIFIED RADICAL MASTECTOMY WITH ERAS PATHWAY (Left) INSERTION PORT-A-CATH WITH ULTRASOUND (N/A) as a surgical intervention .  The patient's history has been reviewed, patient examined, no change in status, stable for surgery.  I have reviewed the patient's chart and labs.  Questions were answered to the patient's satisfaction.     [name]

## 2016-12-15 DIAGNOSIS — C50912 Malignant neoplasm of unspecified site of left female breast: Secondary | ICD-10-CM | POA: Diagnosis not present

## 2016-12-15 LAB — BASIC METABOLIC PANEL
Anion gap: 6 (ref 5–15)
BUN: 8 mg/dL (ref 6–20)
CO2: 27 mmol/L (ref 22–32)
Calcium: 9 mg/dL (ref 8.9–10.3)
Chloride: 104 mmol/L (ref 101–111)
Creatinine, Ser: 0.67 mg/dL (ref 0.44–1.00)
GFR calc Af Amer: >60 mL/min (ref >60)
GFR calc non Af Amer: >60 mL/min (ref >60)
Glucose, Bld: 111 mg/dL — ABNORMAL HIGH (ref 65–99)
Potassium: 4.5 mmol/L (ref 3.5–5.1)
Sodium: 137 mmol/L (ref 135–145)

## 2016-12-15 LAB — CBC
HCT: 32.3 % — ABNORMAL LOW (ref 36.0–46.0)
Hemoglobin: 10.7 g/dL — ABNORMAL LOW (ref 12.0–15.0)
MCH: 32 pg (ref 26.0–34.0)
MCHC: 33.1 g/dL (ref 30.0–36.0)
MCV: 96.7 fL (ref 78.0–100.0)
Platelets: 221 10*3/uL (ref 150–400)
RBC: 3.34 MIL/uL — ABNORMAL LOW (ref 3.87–5.11)
RDW: 12.8 % (ref 11.5–15.5)
WBC: 8.8 10*3/uL (ref 4.0–10.5)

## 2016-12-15 MED ORDER — ADULT MULTIVITAMIN W/MINERALS CH
1.0000 | ORAL_TABLET | Freq: Every day | ORAL | Status: DC
  Administered 2016-12-15: 1 via ORAL
  Filled 2016-12-15 (×2): qty 1

## 2016-12-15 MED ORDER — ENOXAPARIN SODIUM 30 MG/0.3ML ~~LOC~~ SOLN
30.0000 mg | SUBCUTANEOUS | Status: DC
  Administered 2016-12-16: 30 mg via SUBCUTANEOUS
  Filled 2016-12-15: qty 0.3

## 2016-12-15 MED ORDER — ENSURE ENLIVE PO LIQD
237.0000 mL | Freq: Two times a day (BID) | ORAL | Status: DC
  Administered 2016-12-15 – 2016-12-16 (×2): 237 mL via ORAL

## 2016-12-15 NOTE — Progress Notes (Signed)
Initial Nutrition Assessment  DOCUMENTATION CODES:   Severe malnutrition in context of chronic illness, Underweight  INTERVENTION:   -Provide Ensure Enlive po BID, each supplement provides 350 kcal and 20 grams of protein -Provide Multivitamin with minerals daily -RD will continue to monitor for needs  NUTRITION DIAGNOSIS:   Malnutrition(severe) related to chronic illness, cancer and cancer related treatments as evidenced by severe depletion of body fat, severe depletion of muscle mass.  GOAL:   Patient will meet greater than or equal to 90% of their needs  MONITOR:   PO intake, Supplement acceptance, Labs, Weight trends, I & O's  REASON FOR ASSESSMENT:   Malnutrition Screening Tool    ASSESSMENT:   Pt admitted with left breast cancer. 9/5: LEFT MODIFIED RADICAL MASTECTOMY WITH ERAS PATHWAY (Left) INSERTION PORT-A-CATH WITH ULTRASOUND  Patient was previously seen by [facility] on 8/1. Pt with history of underweight BMI and severe malnutrition. Pt has seen dietitians in the past to help with low weight.  Pt states she eats well but this does not help her gain weight. Pt with increased needs now s/p mastectomy. Will increase Ensure supplement order to BID. PO intake: 50% -which provided 140 kcal and 1g of protein.  Per chart review, pt's weight is stable. Nutrition-Focused physical exam completed. Findings are severe fat depletion, severe muscle depletion, and no edema.   Labs reviewed. Medications: IV Protonix daily, Senokot tablet BID, Lactated Ringers w/ KCl infusion at 100 ml/hr   Diet Order:  Diet regular Room service appropriate? Yes; Fluid consistency: Thin  Skin:   (8/5 L breast incision)  Last BM:  9/5  Height:   Ht Readings from Last 1 Encounters:  12/14/16 5\' 6"  (1.676 m)    Weight:   Wt Readings from Last 1 Encounters:  12/14/16 93 lb (42.2 kg)    Ideal Body Weight:  59.1 kg  BMI:  Body mass index is 15.01 kg/m.  Estimated Nutritional  Needs:   Kcal:  1500-1700  Protein:  65-75g  Fluid:  1.7L/day  EDUCATION NEEDS:   No education needs identified at this time  Clayton Bibles, MS, RD, LDN Pager: [phone] After Hours Pager: [phone]

## 2016-12-15 NOTE — Progress Notes (Signed)
Spoke with patient at bedside. Patient feels confident about being able to care for self at home. She has a roommate that she states assist as needed. She goes to the health dept for primary care needs, has access to medications, has access to pharmacies. CSW provided patient with transportation resources. Bedside nurse educating patient on care of drain, patient feels she will be able to manage these. Will continue to follow for d/c needs.

## 2016-12-15 NOTE — Progress Notes (Signed)
CSW consulted to assist with transportation needs and SA resources. CSW met briefly with pt this am. " I just got some pain medication and I'm very sleepy. " CSW provided SA resources, SCAT application, Medical transport resources, buss passes. CSW encouraged pt to have her nsg contact CSW when she was ready to review info provided.  Werner Lean LCSW [phone]

## 2016-12-15 NOTE — Progress Notes (Signed)
1 Day Post-Op  Subjective: Stable and alert.  States pain is manageable. No respiratory problems States she got up to bathroom Urine output 600 mL overnight Total drainage from JP is 10 5 mL, not excessive Morning lab work pending  Objective: Vital signs in last 24 hours: Temp:  [97.4 F (36.3 C)-98.7 F (37.1 C)] 97.9 F (36.6 C) (09/06 0434) Pulse Rate:  [64-87] 75 (09/06 0434) Resp:  [9-23] 14 (09/06 0434) BP: (85-112)/(61-80) 94/65 (09/06 0434) SpO2:  [90 %-100 %] 100 % (09/06 0434) Weight:  [42.2 kg (93 lb)] 42.2 kg (93 lb) (09/05 1246) Last BM Date: 12/14/16  Intake/Output from previous day: 09/05 0701 - 09/06 0700 In: 2100 [I.V.:2100] Out: 805 [Urine:600; Drains:105; Blood:100] Intake/Output this shift: Total I/O In: -  Out: 630 [Urine:550; Drains:80]  General appearance: Alert.  No obvious distress.  Mental status at baseline.  Appropriate. Resp: clear to auscultation bilaterally Chest wall: no tenderness, Port site right infraclavicular area looks good.  No hematoma mastectomy wound left chest wall looks good.  Skin flaps viable.  No hematoma or seroma.  Drainage serosanguineous.  Moves left shoulder around reasonably well  Lab Results:  Results for orders placed or performed during the hospital encounter of 12/14/16 (from the past 24 hour(s))  Rapid urine drug screen (hospital performed)     Status: Abnormal   Collection Time: 12/14/16 12:22 PM  Result Value Ref Range   Opiates NONE DETECTED NONE DETECTED   Cocaine POSITIVE (A) NONE DETECTED   Benzodiazepines NONE DETECTED NONE DETECTED   Amphetamines NONE DETECTED NONE DETECTED   Tetrahydrocannabinol NONE DETECTED NONE DETECTED   Barbiturates NONE DETECTED NONE DETECTED  CBC     Status: Abnormal   Collection Time: 12/14/16  6:53 PM  Result Value Ref Range   WBC 9.2 4.0 - 10.5 K/uL   RBC 3.42 (L) 3.87 - 5.11 MIL/uL   Hemoglobin 11.1 (L) 12.0 - 15.0 g/dL   HCT 33.5 (L) 36.0 - 46.0 %   MCV 98.0 78.0 -  100.0 fL   MCH 32.5 26.0 - 34.0 pg   MCHC 33.1 30.0 - 36.0 g/dL   RDW 13.1 11.5 - 15.5 %   Platelets 223 150 - 400 K/uL  Creatinine, serum     Status: None   Collection Time: 12/14/16  6:53 PM  Result Value Ref Range   Creatinine, Ser 0.88 0.44 - 1.00 mg/dL   GFR calc non Af Amer >60 >60 mL/min   GFR calc Af Amer >60 >60 mL/min     Studies/Results: X-ray Chest Pa Or Ap  Result Date: 12/14/2016 CLINICAL DATA:  Port-A-Cath placement.  Breast cancer. EXAM: CHEST 1 VIEW COMPARISON:  Two-view chest x-ray 12/06/2016. FINDINGS: Surgical clips are present in the left axilla. Skin staples are present over the left breast. A drain is in place. A new right subclavian Port-A-Cath is in place. The tip is in the mid SVC, above the cavoatrial junction. There is a right angle to the catheter just below the clavicle on this image. The heart size is normal. There is no pneumothorax. The lungs are clear. No edema or effusion is present. IMPRESSION: 1. Interval placement of right subclavian Port-A-Cath without evidence for immediate complication. 2. Postsurgical changes of the left breast and axilla. Electronically Signed   By: San Morelle M.D.   On: 12/14/2016 17:56   Dg C-arm 1-60 Min-no Report  Result Date: 12/14/2016 Fluoroscopy was utilized by the requesting physician.  No radiographic interpretation.    Marland Kitchen  enoxaparin (LOVENOX) injection  40 mg Subcutaneous Q24H  . feeding supplement (ENSURE ENLIVE)  237 mL Oral Daily  . haloperidol  10 mg Oral QHS  . pantoprazole (PROTONIX) IV  40 mg Intravenous QHS  . [START ON 12/21/2016] risperiDONE microspheres  50 mg Intramuscular Q14 Days  . senna  1 tablet Oral BID     Assessment/Plan: s/p Procedure(s): LEFT MODIFIED RADICAL MASTECTOMY WITH ERAS PATHWAY INSERTION PORT-A-CATH WITH ULTRASOUND    POD #1.  Left modified radical mastectomy and Port-A-Cath insertion.   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 I have asked case management to investigate and arrange home health needs I have asked social work to get involved  Medically she will possibly be ready for discharge tomorrow.  Whether social support system can be arranged is unclear.  @PROBHOSP @  LOS: 0 days    Kashmere Staffa M 12/15/2016  . .prob

## 2016-12-16 DIAGNOSIS — C50912 Malignant neoplasm of unspecified site of left female breast: Secondary | ICD-10-CM | POA: Diagnosis not present

## 2016-12-16 MED ORDER — HYDROCODONE-ACETAMINOPHEN 5-325 MG PO TABS: 1.0000 | ORAL_TABLET | ORAL | 0 refills | Status: DC | PRN

## 2016-12-16 NOTE — Progress Notes (Signed)
Patient is being discharged home. Discharge instructions were given to patient. Patient stated she understood all instructions and had no question at this time

## 2016-12-16 NOTE — Progress Notes (Signed)
Pt waiting on ride home, which will pick her up at 1600/RN.

## 2016-12-16 NOTE — Discharge Instructions (Signed)
[facility] [place] °[phone] ° °MASTECTOMY: POST OP INSTRUCTIONS ° °Always review your discharge instruction sheet given to you by the facility where your surgery was performed. °IF YOU HAVE DISABILITY OR FAMILY LEAVE FORMS, YOU MUST BRING THEM TO THE OFFICE FOR PROCESSING.   °DO NOT GIVE THEM TO YOUR DOCTOR. °A prescription for pain medication may be given to you upon discharge.  Take your pain medication as prescribed, if needed.  If narcotic pain medicine is not needed, then you may take acetaminophen (Tylenol) or ibuprofen (Advil) as needed. °1. Take your usually prescribed medications unless otherwise directed. °2. If you need a refill on your pain medication, please contact your pharmacy.  They will contact our office to request authorization.  Prescriptions will not be filled after 5pm or on week-ends. °3. You should follow a light diet the first few days after arrival home, such as soup and crackers, etc.  Resume your normal diet the day after surgery. °4. Most patients will experience some swelling and bruising on the chest and underarm.  Ice packs will help.  Swelling and bruising can take several days to resolve.  °5. It is common to experience some constipation if taking pain medication after surgery.  Increasing fluid intake and taking a stool softener (such as Colace) will usually help or prevent this problem from occurring.  A mild laxative (Milk of Magnesia or Miralax) should be taken according to package instructions if there are no bowel movements after 48 hours. °6. Unless discharge instructions indicate otherwise, leave your bandage dry and in place until your next appointment in 3-5 days.  You may take a limited sponge bath.  No tube baths or showers until the drains are removed.  You may have steri-strips (small skin tapes) in place directly over the incision.  These strips should be left on the skin for 7-10 days.  If your surgeon used skin glue on the incision, you may  shower in 24 hours.  The glue will flake off over the next 2-3 weeks.  Any sutures or staples will be removed at the office during your follow-up visit. °7. DRAINS:  If you have drains in place, it is important to keep a list of the amount of drainage produced each day in your drains.  Before leaving the hospital, you should be instructed on drain care.  Call our office if you have any questions about your drains. °8. ACTIVITIES:  You may resume regular (light) daily activities beginning the next day--such as daily self-care, walking, climbing stairs--gradually increasing activities as tolerated.  You may have sexual intercourse when it is comfortable.  Refrain from any heavy lifting or straining until approved by your doctor. °a. You may drive when you are no longer taking prescription pain medication, you can comfortably wear a seatbelt, and you can safely maneuver your car and apply brakes. °b. RETURN TO WORK:  __________________________________________________________ °9. You should see your doctor in the office for a follow-up appointment approximately 3-5 days after your surgery.  Your doctor’s nurse will typically make your follow-up appointment when she calls you with your pathology report.  Expect your pathology report 2-3 business days after your surgery.  You may call to check if you do not hear from us after three days.   °10. OTHER INSTRUCTIONS: ______________________________________________________________________________________________ ____________________________________________________________________________________________ °WHEN TO CALL YOUR DOCTOR: °1. Fever over 101.0 °2. Nausea and/or vomiting °3. Extreme swelling or bruising °4. Continued bleeding from incision. °5. Increased pain, redness, or drainage from the incision. °  The clinic staff is available to answer your questions during regular business hours.  Please don’t hesitate to call and ask to speak to one of the nurses for clinical  concerns.  If you have a medical emergency, go to the nearest emergency room or call 911.  A surgeon from [facility] is always on call at the hospital. °[address], [address], [place], [place]  [address] ? [address] [address], [place], [place]   [address] °[phone] ? [phone] ? FAX [phone] °Web site: [email or web address] °

## 2016-12-16 NOTE — Discharge Summary (Addendum)
Patient ID: Diane Rosario [id number] 41 y.o. 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                                         Bipolar disorder                                         PTSD                                         Cocaine abuse                                         Tobacco abuse                                          ETOH  abuse                                          Protein calorie malnutrition, moderate                                             Operations: Procedure(s): LEFT MODIFIED RADICAL MASTECTOMY WITH ERAS PATHWAY INSERTION PORT-A-CATH WITH ULTRASOUND  Admission Condition: fair  Discharged Condition: fair  Indication for Admission: This is a 41 year old female who presents for elective surgery to treat her locally advanced left breast cancer. She was originally seen in the [facility] by Dr. Lindi Adie, Dr. Lisbeth Renshaw, and me. She is followed intermittently at [facility] for primary care. She has substance abuse problems and does not get regular medical care. She recently noticed something in her left breast about 2 months ago, went to the emergency room, was referred for imaging studies. They found a spiculated mass in the superior left breast far posterior and scattered clusters of calcifications. 5 cm area of tissue involved. Right breast was said to be normal but very high density on both sides. Multiple abnormal lymph nodes on the left axilla. Second breast mass high in the upper outer quadrant,overlying the lateral border of the pectoralis major muscle. 2 breast masses and 2 axillary nodes were biopsied in all 4 biopsy shows grade 2 invasive carcinoma. ER 30%. PR 0. HER-2 positive.  Ki-67 20% We attempted to get an MRI but she could not sit still was very uncomfortable with huge motion artifact. Dr. Lindi Adie and I have decided to forego that. She did keep her up over the genetics and genetic testing was drawn and the results are pending Her uncle Diane Rosario questioned whether she has should have a prophylactic mastectomy on the  right side and we had a long discussion about that. She does not want to do that. Admittedly we are all concerned about compliance with long-term follow-up. Comorbidities include PTSD. Her husband was murdered. Bipolar on medications. Malnourished with BMI 14. Mother and father living but no medical details Social history positive for cocaine and marijuana use as well as alcohol and tobacco abuse. Widowed. Lives with a roommate. Has her uncle Diane Rosario in the area but owns a farm.She is supposed  to stay with her grandmother postop. We had a long conversation. Basically she will be scheduled for left modified radical mastectomy and Port-A-Cath insertion. I discussed the indications, details, techniques, and numerous risk of the surgery with the patient and her uncle Diane Rosario..They agree with this plan.  Hospital Course: On the day of admission the patient was taken to the operating room and underwent insertion of Port-A-Cath and left modified radical mastectomy.  Operative findings revealed that there was a large 5 cm mass in the central left breast slightly upper outer but mostly retroareolar. There was a second large mass at very high in the upper outer quadrant almost into the axilla.  This required an oblique incision from inferior medial to superior lateral going all the way to the axilla to completely excise the mass in the breast.we were able to achieve primary wound closure.There are multiple palpably enlarged axillary lymph nodes.  The tumor masses in the breast were not invading the muscle, however we did a complete left  axillary lymph node dissection and I removed numerous pathologically enlarged hard lymph nodes.  We preserved the thoracodorsal neurovascular bundle.  The Port-A-Cath was inserted through the right subclavian vein without difficulty.     The surgery was completed at  proximally 5 PM.  We kept her in the hospital for 2 nights to ensure that mobility, wound care, and nutrition were adequately addressed.  We also involved case management and social work.  They're going to help with transition to the home.  The patient insists discharge back to her home with her roommate.  Her Uncle  Diane Rosario is involved.  He requested that we arrange home health nursing which I did.    The patient remained stable.  She became ambulatory in the halls.  She tolerated her diet very well.  She was well-hydrated and was  diuresing.  Pain was well controlled and she looked good.  On the day of discharge she was alert and requesting to go home.  Left mastectomy wound skin flaps looked good.  Both drains were functioning well with a good seal and serosanguineous drainage.  She was given instruction in diet activities and drain care.  Home health nursing was arranged.  She was given a prescription for Norco for pain.  She was asked to return to see me in the office in one week.  Consults: Case management and social work services  Significant Diagnostic Studies: Surgical pathology, pending  Treatments: surgery: Left modified radical mastectomy and insertion of Port-A-Cath  Disposition: Home  Patient Instructions:  Allergies as of 12/16/2016      Reactions   Asa [aspirin] Hives      Medication List    TAKE these medications   ALPRAZolam 0.5 MG tablet Commonly known as:  XANAX Take 0.5 mg by mouth 2 (two) times daily as needed for anxiety.   ENSURE Take 237 mLs by mouth daily.   haloperidol 10 MG tablet Commonly known as:  HALDOL Take 10 mg by mouth at bedtime.  HYDROcodone-acetaminophen 5-325 MG tablet Commonly  known as:  NORCO/VICODIN Take 1-2 tablets by mouth every 4 (four) hours as needed for moderate pain.   risperiDONE microspheres 50 MG injection Commonly known as:  RISPERDAL CONSTA Inject 50 mg into the muscle every 14 (fourteen) days.            Discharge Care Instructions        Start     Ordered   12/16/16 0000  HYDROcodone-acetaminophen (NORCO/VICODIN) 5-325 MG tablet  Every 4 hours PRN     12/16/16 0540   12/16/16 0000  Increase activity slowly     12/16/16 0540   12/16/16 0000  Diet - low sodium heart healthy     12/16/16 0540   12/16/16 0000  May walk up steps     12/16/16 0540   12/16/16 0000  May shower / [place]     12/16/16 0540   12/16/16 0000  Driving Restrictions    Comments:  No driving   62/95/28 4132   12/16/16 0000  Lifting restrictions    Comments:  15 lbs.   12/16/16 0540   12/16/16 0000  Discharge wound care:    Comments:  Keep a written record of the drainage and bring that to the office with you at each appointment  Keep the bandages clean and dry Change the bandages if they get wet or dirty  You may take a shower if you wish but someone needs to help you replaced the bandage immediately after the shower   12/16/16 0540   12/16/16 0000  Call MD for:  persistant dizziness or light-headedness     12/16/16 0540   12/16/16 0000  Call MD for:  hives     12/16/16 0540   12/16/16 0000  Call MD for:  difficulty breathing, headache or visual disturbances     12/16/16 0540   12/16/16 0000  Call MD for:  redness, tenderness, or signs of infection (pain, swelling, redness, odor or green/yellow discharge around incision site)     12/16/16 0540   12/16/16 0000  Call MD for:  severe uncontrolled pain     12/16/16 0540   12/16/16 0000  Call MD for:  persistant nausea and vomiting     12/16/16 0540   12/16/16 0000  Call MD for:  temperature >100.4     12/16/16 0540      Activity: Ambulate frequently.  No heavy lifting.  No driving. Diet: regular  diet Wound Care: as directed  Follow-up:  With Dr. Dalbert Batman in 1 week.  Signed: Edsel Petrin. Dalbert Batman, M.D., FACS General and minimally invasive surgery Breast and Colorectal Surgery  Addendum: I logged onto the [facility] website and reviewed her medication history  12/16/2016, 5:41 AM

## 2016-12-20 ENCOUNTER — Ambulatory Visit (HOSPITAL_BASED_OUTPATIENT_CLINIC_OR_DEPARTMENT_OTHER): Payer: Medicaid Other | Admitting: Hematology and Oncology

## 2016-12-20 ENCOUNTER — Encounter: Payer: Self-pay | Admitting: *Deleted

## 2016-12-20 ENCOUNTER — Other Ambulatory Visit: Payer: Medicaid Other

## 2016-12-20 ENCOUNTER — Telehealth: Payer: Self-pay | Admitting: *Deleted

## 2016-12-20 ENCOUNTER — Telehealth: Payer: Self-pay | Admitting: Hematology and Oncology

## 2016-12-20 DIAGNOSIS — C50412 Malignant neoplasm of upper-outer quadrant of left female breast: Secondary | ICD-10-CM | POA: Diagnosis present

## 2016-12-20 DIAGNOSIS — R599 Enlarged lymph nodes, unspecified: Secondary | ICD-10-CM

## 2016-12-20 DIAGNOSIS — F319 Bipolar disorder, unspecified: Secondary | ICD-10-CM | POA: Diagnosis not present

## 2016-12-20 DIAGNOSIS — Z170 Estrogen receptor positive status [ER+]: Secondary | ICD-10-CM | POA: Diagnosis not present

## 2016-12-20 NOTE — Telephone Encounter (Signed)
No additional note

## 2016-12-20 NOTE — Progress Notes (Signed)
Patient Care Team: Department, [facility] as PCP - General [name], MD as Consulting Physician (General Surgery) Nicholas Lose, MD as Consulting Physician (Hematology and Oncology) Kyung Rudd, MD as Consulting Physician (Radiation Oncology)  DIAGNOSIS:  Encounter Diagnosis  Name Primary?  . Malignant neoplasm of upper-outer quadrant of left breast in female, estrogen receptor positive ([place])     SUMMARY OF ONCOLOGIC HISTORY:   Malignant neoplasm of upper-outer quadrant of left breast in female, estrogen receptor positive ([place])   11/01/2016 Initial Diagnosis    Palpable left breast mass with 4 enl axillary lymph nodes 4.8 x 0.8 x 3.1 cm; left axillary mass 1.1 x 0.5 cm, lymph node 1.4 x 0.8 cm biopsy grade 2-3 IDC ER 30% PR 0% HER-2 positive ratio 7.53, Ki-67 20%, T2 N2 MX stage IIIa clinical stage      12/02/2016 Genetic Testing    Negative genetic testing on the STAT panel.  The STAT Breast cancer panel offered by [facility] includes sequencing and rearrangement analysis for the following 9 genes:  ATM, BRCA1, BRCA2, CDH1, CHEK2, PALB2, PTEN, STK11 and TP53.   The report date is December 02, 2016.      12/14/2016 Surgery    Left modified radical mastectomy: IDC grade 3, spans 5 cm and a 2.2 cm with extensive DCIS grade 3, lymphovascular invasion present, margins negative, 17/17 lymph nodes positive, ER 30%, PR 0%, HER-2 positive, Ki-67 20%, T2 N3a M0 stage IIIB AJCC 8       CHIEF COMPLIANT: follow-up after left mastectomy and axillary lymph node dissection  INTERVAL HISTORY: Diane Rosario is a 41 year old with above-mentioned history of left breast cancer treated with mastectomy and axillary lymph node dissection. She is here today to discuss the pathology report. She complains of pain and discomfort in the breast and chest wall and axilla. She was found to 17 positive lymph nodes. There are multifocal disease in the breast 5 cm and 2.2 cm. She still has drains in  place.  REVIEW OF SYSTEMS:   Constitutional: Denies fevers, chills or abnormal weight loss Eyes: Denies blurriness of vision Ears, nose, mouth, throat, and face: Denies mucositis or sore throat Respiratory: Denies cough, dyspnea or wheezes Cardiovascular: Denies palpitation, chest discomfort Gastrointestinal:  Denies nausea, heartburn or change in bowel habits Skin: Denies abnormal skin rashes Lymphatics: Denies new lymphadenopathy or easy bruising Neurological:Denies numbness, tingling or new weaknesses Behavioral/Psych: Mood is stable, no new changes  Extremities: No lower extremity edema Breast: left mastectomy All other systems were reviewed with the patient and are negative.  I have reviewed the past medical history, past surgical history, social history and family history with the patient and they are unchanged from previous note.  ALLERGIES:  is allergic to asa [aspirin].  MEDICATIONS:  Current Outpatient Prescriptions  Medication Sig Dispense Refill  . ALPRAZolam (XANAX) 0.5 MG tablet Take 0.5 mg by mouth 2 (two) times daily as needed for anxiety.     . ENSURE (ENSURE) Take 237 mLs by mouth daily.    . haloperidol (HALDOL) 10 MG tablet Take 10 mg by mouth at bedtime.   1  . HYDROcodone-acetaminophen (NORCO/VICODIN) 5-325 MG tablet Take 1-2 tablets by mouth every 4 (four) hours as needed for moderate pain. 20 tablet 0  . risperiDONE microspheres (RISPERDAL CONSTA) 50 MG injection Inject 50 mg into the muscle every 14 (fourteen) days.     No current facility-administered medications for this visit.     PHYSICAL EXAMINATION: ECOG PERFORMANCE STATUS:  2 - Symptomatic, <50% confined to bed  Vitals:   12/20/16 1346  BP: 98/66  Pulse: 86  Resp: 18  Temp: 97.8 F (36.6 C)  SpO2: 99%   Filed Weights   12/20/16 1346  Weight: 92 lb 12.8 oz (42.1 kg)    GENERAL:alert, no distress and comfortable SKIN: skin color, texture, turgor are normal, no rashes or significant  lesions EYES: normal, Conjunctiva are pink and non-injected, sclera clear OROPHARYNX:no exudate, no erythema and lips, buccal mucosa, and tongue normal  NECK: supple, thyroid normal size, non-tender, without nodularity LYMPH:  no palpable lymphadenopathy in the cervical, axillary or inguinal LUNGS: clear to auscultation and percussion with normal breathing effort HEART: regular rate & rhythm and no murmurs and no lower extremity edema ABDOMEN:abdomen soft, non-tender and normal bowel sounds MUSCULOSKELETAL:no cyanosis of digits and no clubbing  NEURO: alert & oriented x 3 with fluent speech, no focal motor/sensory deficits EXTREMITIES: No lower extremity edema  LABORATORY DATA:  I have reviewed the data as listed   Chemistry      Component Value Date/Time   NA 137 12/15/2016 0532   NA 141 11/09/2016 1241   K 4.5 12/15/2016 0532   K 3.7 11/09/2016 1241   CL 104 12/15/2016 0532   CO2 27 12/15/2016 0532   CO2 31 (H) 11/09/2016 1241   BUN 8 12/15/2016 0532   BUN 13.8 11/09/2016 1241   CREATININE 0.67 12/15/2016 0532   CREATININE 1.1 11/09/2016 1241      Component Value Date/Time   CALCIUM 9.0 12/15/2016 0532   CALCIUM 10.0 11/09/2016 1241   ALKPHOS 62 12/06/2016 1339   ALKPHOS 74 11/09/2016 1241   AST 22 12/06/2016 1339   AST 26 11/09/2016 1241   ALT 13 (L) 12/06/2016 1339   ALT 11 11/09/2016 1241   BILITOT 0.3 12/06/2016 1339   BILITOT 0.28 11/09/2016 1241       Lab Results  Component Value Date   WBC 8.8 12/15/2016   HGB 10.7 (L) 12/15/2016   HCT 32.3 (L) 12/15/2016   MCV 96.7 12/15/2016   PLT 221 12/15/2016   NEUTROABS 3.9 12/06/2016    ASSESSMENT & PLAN:  Malignant neoplasm of upper-outer quadrant of left breast in female, estrogen receptor positive ([place]) 12/14/2016: Left modified radical mastectomy: IDC grade 3, spans 5 cm and a 2.2 cm with extensive DCIS grade 3, lymphovascular invasion present, margins negative, 17/17 lymph nodes positive, ER 30%, PR 0%,  HER-2 positive, Ki-67 20%, T2 N3a M0 stage IIIB AJCC 8  Patient has long-standing issues with bipolar disorder and substance abuse previously. CT CAP  and Bone Scan 11/17/2016: No metastases  11 mm left lower lobe groundglass nodule probably infectious etiology   Pathology counseling: I discussed the final pathology report of the patient provided  a copy of this report. I discussed the margins as well as lymph node surgeries. We also discussed the final staging along with previously performed ER/PR and HER-2/neu testing. It is very shocking to see that she has 17 positive lymph nodes and no evidence of distant metastases. I strongly suspect that they could be microscopic distant metastatic disease. We will have to be very diligent in monitoring her disease.  Treatment plan:  1. Adjuvant chemotherapy with [place] Perjeta every 3 weeks 6 followed by Herceptin Perjeta maintenance for 6 months 3. Followed by adjuvant radiation 4. Followed by adjuvant antiestrogen therapy 5-7 years  Return to clinic in 3 weeks to start chemotherapy We will plan to start chemotherapy  on 01/10/2017.  I spent 25 minutes talking to the patient of which more than half was spent in counseling and coordination of care.  No orders of the defined types were placed in this encounter.  The patient has a good understanding of the overall plan. she agrees with it. she will call with any problems that may develop before the next visit here.   Rulon Eisenmenger, MD 12/20/16

## 2016-12-20 NOTE — Telephone Encounter (Signed)
Gave patient avs and calendar with appts.  °

## 2016-12-20 NOTE — Assessment & Plan Note (Signed)
12/14/2016: Left modified radical mastectomy: IDC grade 3, spans 5 cm and a 2.2 cm with extensive DCIS grade 3, lymphovascular invasion present, margins negative, 17/17 lymph nodes positive, ER 30%, PR 0%, HER-2 positive, Ki-67 20%, T2 N3a M0 stage IIIB AJCC 8  Patient has long-standing issues with bipolar disorder and substance abuse previously. CT CAP  and Bone Scan 11/17/2016: No metastases  11 mm left lower lobe groundglass nodule probably infectious etiology   Pathology counseling: I discussed the final pathology report of the patient provided  a copy of this report. I discussed the margins as well as lymph node surgeries. We also discussed the final staging along with previously performed ER/PR and HER-2/neu testing.  Treatment plan:  1. Adjuvant chemotherapy with [place] Perjeta every 3 weeks 6 followed by Herceptin Perjeta maintenance for 6 months 3. Followed by adjuvant radiation 4. Followed by adjuvant antiestrogen therapy 5-7 years  Return to clinic in 3 weeks to start chemotherapy

## 2017-01-05 ENCOUNTER — Other Ambulatory Visit: Payer: Self-pay | Admitting: Hematology and Oncology

## 2017-01-05 DIAGNOSIS — Z170 Estrogen receptor positive status [ER+]: Principal | ICD-10-CM

## 2017-01-05 DIAGNOSIS — C50412 Malignant neoplasm of upper-outer quadrant of left female breast: Secondary | ICD-10-CM

## 2017-01-05 MED ORDER — ONDANSETRON HCL 8 MG PO TABS: 8.0000 mg | ORAL_TABLET | Freq: Two times a day (BID) | ORAL | 1 refills | Status: DC | PRN

## 2017-01-05 MED ORDER — LIDOCAINE-PRILOCAINE 2.5-2.5 % EX CREA: TOPICAL_CREAM | CUTANEOUS | 3 refills | Status: DC

## 2017-01-05 MED ORDER — LORAZEPAM 0.5 MG PO TABS: 0.5000 mg | ORAL_TABLET | Freq: Every day | ORAL | 0 refills | Status: DC

## 2017-01-05 MED ORDER — DEXAMETHASONE 4 MG PO TABS: 4.0000 mg | ORAL_TABLET | Freq: Every day | ORAL | 1 refills | Status: DC

## 2017-01-05 MED ORDER — PROCHLORPERAZINE MALEATE 10 MG PO TABS: 10.0000 mg | ORAL_TABLET | Freq: Four times a day (QID) | ORAL | 1 refills | Status: DC | PRN

## 2017-01-05 NOTE — Progress Notes (Signed)
START ON PATHWAY REGIMEN - Breast     A cycle is every 21 days:     Pertuzumab      Pertuzumab      Trastuzumab      Trastuzumab      Carboplatin      Docetaxel   **Always confirm dose/schedule in your pharmacy ordering system**    Patient Characteristics: Preoperative or Nonsurgical Candidate (Clinical Staging), Neoadjuvant Therapy followed by Surgery, Invasive Disease, Chemotherapy, HER2 Positive, ER Positive Therapeutic Status: Preoperative or Nonsurgical Candidate (Clinical Staging) AJCC M Category: cM0 AJCC Grade: G3 Breast Surgical Plan: Neoadjuvant Therapy followed by Surgery ER Status: Positive (+) AJCC 8 Stage Grouping: IIIB HER2 Status: Positive (+) AJCC T Category: cT2 AJCC N Category: cN3a PR Status: Negative (-) Intent of Therapy: Curative Intent, Discussed with Patient

## 2017-01-09 ENCOUNTER — Other Ambulatory Visit: Payer: Self-pay

## 2017-01-09 DIAGNOSIS — Z170 Estrogen receptor positive status [ER+]: Principal | ICD-10-CM

## 2017-01-09 DIAGNOSIS — C50412 Malignant neoplasm of upper-outer quadrant of left female breast: Secondary | ICD-10-CM

## 2017-01-09 MED ORDER — ONDANSETRON HCL 8 MG PO TABS: 8.0000 mg | ORAL_TABLET | Freq: Two times a day (BID) | ORAL | 1 refills | Status: DC | PRN

## 2017-01-09 MED ORDER — DEXAMETHASONE 4 MG PO TABS: 4.0000 mg | ORAL_TABLET | Freq: Every day | ORAL | 1 refills | Status: DC

## 2017-01-09 MED ORDER — LORAZEPAM 0.5 MG PO TABS: 0.5000 mg | ORAL_TABLET | Freq: Every day | ORAL | 0 refills | Status: DC

## 2017-01-09 MED ORDER — LIDOCAINE-PRILOCAINE 2.5-2.5 % EX CREA: TOPICAL_CREAM | CUTANEOUS | 3 refills | Status: DC

## 2017-01-09 MED ORDER — PROCHLORPERAZINE MALEATE 10 MG PO TABS: 10.0000 mg | ORAL_TABLET | Freq: Four times a day (QID) | ORAL | 1 refills | Status: DC | PRN

## 2017-01-10 ENCOUNTER — Encounter: Payer: Self-pay | Admitting: General Practice

## 2017-01-10 ENCOUNTER — Ambulatory Visit (HOSPITAL_BASED_OUTPATIENT_CLINIC_OR_DEPARTMENT_OTHER): Payer: Medicaid Other

## 2017-01-10 ENCOUNTER — Other Ambulatory Visit (HOSPITAL_BASED_OUTPATIENT_CLINIC_OR_DEPARTMENT_OTHER): Payer: Medicaid Other

## 2017-01-10 ENCOUNTER — Ambulatory Visit (HOSPITAL_BASED_OUTPATIENT_CLINIC_OR_DEPARTMENT_OTHER): Payer: Medicaid Other | Admitting: Hematology and Oncology

## 2017-01-10 ENCOUNTER — Encounter: Payer: Self-pay | Admitting: *Deleted

## 2017-01-10 ENCOUNTER — Ambulatory Visit: Payer: Medicaid Other

## 2017-01-10 VITALS — BP 101/72 | HR 92 | Temp 99.4°F | Resp 18

## 2017-01-10 VITALS — BP 98/70 | HR 75 | Temp 97.7°F | Resp 18

## 2017-01-10 DIAGNOSIS — C50412 Malignant neoplasm of upper-outer quadrant of left female breast: Secondary | ICD-10-CM

## 2017-01-10 DIAGNOSIS — Z170 Estrogen receptor positive status [ER+]: Principal | ICD-10-CM

## 2017-01-10 DIAGNOSIS — Z5111 Encounter for antineoplastic chemotherapy: Secondary | ICD-10-CM | POA: Diagnosis not present

## 2017-01-10 DIAGNOSIS — C50919 Malignant neoplasm of unspecified site of unspecified female breast: Secondary | ICD-10-CM

## 2017-01-10 DIAGNOSIS — R911 Solitary pulmonary nodule: Secondary | ICD-10-CM | POA: Diagnosis not present

## 2017-01-10 DIAGNOSIS — C773 Secondary and unspecified malignant neoplasm of axilla and upper limb lymph nodes: Secondary | ICD-10-CM

## 2017-01-10 DIAGNOSIS — Z5112 Encounter for antineoplastic immunotherapy: Secondary | ICD-10-CM

## 2017-01-10 DIAGNOSIS — F319 Bipolar disorder, unspecified: Secondary | ICD-10-CM

## 2017-01-10 LAB — CBC WITH DIFFERENTIAL/PLATELET
BASO%: 0.5 % (ref 0.0–2.0)
Basophils Absolute: 0 10*3/uL (ref 0.0–0.1)
EOS%: 3 % (ref 0.0–7.0)
Eosinophils Absolute: 0.2 10*3/uL (ref 0.0–0.5)
HCT: 37.3 % (ref 34.8–46.6)
HGB: 11.8 g/dL (ref 11.6–15.9)
LYMPH%: 47.6 % (ref 14.0–49.7)
MCH: 32.2 pg (ref 25.1–34.0)
MCHC: 31.6 g/dL (ref 31.5–36.0)
MCV: 101.9 fL — ABNORMAL HIGH (ref 79.5–101.0)
MONO#: 0.6 10*3/uL (ref 0.1–0.9)
MONO%: 9.3 % (ref 0.0–14.0)
NEUT#: 2.6 10*3/uL (ref 1.5–6.5)
NEUT%: 39.6 % (ref 38.4–76.8)
Platelets: 220 10*3/uL (ref 145–400)
RBC: 3.66 10*6/uL — ABNORMAL LOW (ref 3.70–5.45)
RDW: 13.1 % (ref 11.2–14.5)
WBC: 6.6 10*3/uL (ref 3.9–10.3)
lymph#: 3.1 10*3/uL (ref 0.9–3.3)

## 2017-01-10 LAB — COMPREHENSIVE METABOLIC PANEL
ALT: 17 U/L (ref 0–55)
AST: 17 U/L (ref 5–34)
Albumin: 3.6 g/dL (ref 3.5–5.0)
Alkaline Phosphatase: 68 U/L (ref 40–150)
Anion Gap: 8 mEq/L (ref 3–11)
BUN: 12.7 mg/dL (ref 7.0–26.0)
CO2: 26 mEq/L (ref 22–29)
Calcium: 9.4 mg/dL (ref 8.4–10.4)
Chloride: 110 mEq/L — ABNORMAL HIGH (ref 98–109)
Creatinine: 0.9 mg/dL (ref 0.6–1.1)
EGFR: 89 mL/min/{1.73_m2} — ABNORMAL LOW (ref >90)
Glucose: 87 mg/dl (ref 70–140)
Potassium: 3.9 mEq/L (ref 3.5–5.1)
Sodium: 143 mEq/L (ref 136–145)
Total Bilirubin: <0.22 mg/dL (ref 0.20–1.20)
Total Protein: 7.1 g/dL (ref 6.4–8.3)

## 2017-01-10 MED ORDER — SODIUM CHLORIDE 0.9 % IV SOLN
410.0000 mg | Freq: Once | INTRAVENOUS | Status: AC
  Administered 2017-01-10: 410 mg via INTRAVENOUS
  Filled 2017-01-10: qty 41

## 2017-01-10 MED ORDER — PERTUZUMAB CHEMO INJECTION 420 MG/14ML
840.0000 mg | Freq: Once | INTRAVENOUS | Status: AC
  Administered 2017-01-10: 840 mg via INTRAVENOUS
  Filled 2017-01-10: qty 28

## 2017-01-10 MED ORDER — ACETAMINOPHEN 325 MG PO TABS
650.0000 mg | ORAL_TABLET | Freq: Once | ORAL | Status: AC
  Administered 2017-01-10: 650 mg via ORAL

## 2017-01-10 MED ORDER — PALONOSETRON HCL INJECTION 0.25 MG/5ML
0.2500 mg | Freq: Once | INTRAVENOUS | Status: AC
  Administered 2017-01-10: 0.25 mg via INTRAVENOUS

## 2017-01-10 MED ORDER — DIPHENHYDRAMINE HCL 25 MG PO CAPS
ORAL_CAPSULE | ORAL | Status: AC
  Filled 2017-01-10: qty 2

## 2017-01-10 MED ORDER — SODIUM CHLORIDE 0.9% FLUSH
10.0000 mL | INTRAVENOUS | Status: AC | PRN
  Filled 2017-01-10: qty 10

## 2017-01-10 MED ORDER — SODIUM CHLORIDE 0.9 % IV SOLN
Freq: Once | INTRAVENOUS | Status: AC
  Administered 2017-01-10: 10:00:00 via INTRAVENOUS

## 2017-01-10 MED ORDER — DIPHENHYDRAMINE HCL 25 MG PO CAPS
50.0000 mg | ORAL_CAPSULE | Freq: Once | ORAL | Status: AC
  Administered 2017-01-10: 50 mg via ORAL

## 2017-01-10 MED ORDER — SODIUM CHLORIDE 0.9% FLUSH
10.0000 mL | INTRAVENOUS | Status: DC | PRN
  Administered 2017-01-10 (×2): 10 mL
  Filled 2017-01-10: qty 10

## 2017-01-10 MED ORDER — DEXAMETHASONE SODIUM PHOSPHATE 10 MG/ML IJ SOLN
10.0000 mg | Freq: Once | INTRAMUSCULAR | Status: AC
  Administered 2017-01-10: 10 mg via INTRAVENOUS

## 2017-01-10 MED ORDER — PALONOSETRON HCL INJECTION 0.25 MG/5ML
INTRAVENOUS | Status: AC
  Filled 2017-01-10: qty 5

## 2017-01-10 MED ORDER — TRASTUZUMAB CHEMO 150 MG IV SOLR
8.0000 mg/kg | Freq: Once | INTRAVENOUS | Status: AC
  Administered 2017-01-10: 336 mg via INTRAVENOUS
  Filled 2017-01-10: qty 16

## 2017-01-10 MED ORDER — ACETAMINOPHEN 325 MG PO TABS
ORAL_TABLET | ORAL | Status: AC
  Filled 2017-01-10: qty 2

## 2017-01-10 MED ORDER — DEXTROSE 5 % IV SOLN
65.0000 mg/m2 | Freq: Once | INTRAVENOUS | Status: AC
  Administered 2017-01-10: 90 mg via INTRAVENOUS
  Filled 2017-01-10: qty 9

## 2017-01-10 MED ORDER — DEXAMETHASONE SODIUM PHOSPHATE 10 MG/ML IJ SOLN
INTRAMUSCULAR | Status: AC
  Filled 2017-01-10: qty 1

## 2017-01-10 MED ORDER — SODIUM CHLORIDE 0.9 % IV SOLN: 10.0000 mg | Freq: Once | INTRAVENOUS | Status: DC

## 2017-01-10 MED ORDER — HEPARIN SOD (PORK) LOCK FLUSH 100 UNIT/ML IV SOLN
500.0000 [IU] | Freq: Once | INTRAVENOUS | Status: AC | PRN
  Administered 2017-01-10: 500 [IU]
  Filled 2017-01-10: qty 5

## 2017-01-10 MED ORDER — PEGFILGRASTIM 6 MG/0.6ML ~~LOC~~ PSKT
6.0000 mg | PREFILLED_SYRINGE | Freq: Once | SUBCUTANEOUS | Status: AC
  Administered 2017-01-10: 6 mg via SUBCUTANEOUS
  Filled 2017-01-10: qty 0.6

## 2017-01-10 NOTE — Patient Instructions (Signed)
[facility] Discharge Instructions for Patients Receiving Chemotherapy  Today you received the following chemotherapy agents Herceptin, Perjeta, Taxotere, Carboplatin  To help prevent nausea and vomiting after your treatment, we encourage you to take your nausea medication as directed   If you develop nausea and vomiting that is not controlled by your nausea medication, call the clinic.   BELOW ARE SYMPTOMS THAT SHOULD BE REPORTED IMMEDIATELY:  *FEVER GREATER THAN 100.5 F  *CHILLS WITH OR WITHOUT FEVER  NAUSEA AND VOMITING THAT IS NOT CONTROLLED WITH YOUR NAUSEA MEDICATION  *UNUSUAL SHORTNESS OF BREATH  *UNUSUAL BRUISING OR BLEEDING  TENDERNESS IN MOUTH AND THROAT WITH OR WITHOUT PRESENCE OF ULCERS  *URINARY PROBLEMS  *BOWEL PROBLEMS  UNUSUAL RASH Items with * indicate a potential emergency and should be followed up as soon as possible.  Feel free to call the clinic should you have any questions or concerns. The clinic phone number is [phone].  Please show the [place] at check-in to the Emergency Department and triage nurse.   Trastuzumab (Herceptin) injection for infusion What is this medicine? TRASTUZUMAB (tras TOO zoo mab) is a monoclonal antibody. It is used to treat breast cancer and stomach cancer. This medicine may be used for other purposes; ask your health care provider or pharmacist if you have questions. COMMON BRAND NAME(S): Herceptin What should I tell my health care provider before I take this medicine? They need to know if you have any of these conditions: -heart disease -heart failure -lung or breathing disease, like asthma -an unusual or allergic reaction to trastuzumab, benzyl alcohol, or other medications, foods, dyes, or preservatives -pregnant or trying to get pregnant -breast-feeding How should I use this medicine? This drug is given as an infusion into a vein. It is administered in a hospital or clinic by a  specially trained health care professional. Talk to your pediatrician regarding the use of this medicine in children. This medicine is not approved for use in children. Overdosage: If you think you have taken too much of this medicine contact a poison control center or emergency room at once. NOTE: This medicine is only for you. Do not share this medicine with others. What if I miss a dose? It is important not to miss a dose. Call your doctor or health care professional if you are unable to keep an appointment. What may interact with this medicine? This medicine may interact with the following medications: -certain types of chemotherapy, such as daunorubicin, doxorubicin, epirubicin, and idarubicin This list may not describe all possible interactions. Give your health care provider a list of all the medicines, herbs, non-prescription drugs, or dietary supplements you use. Also tell them if you smoke, drink alcohol, or use illegal drugs. Some items may interact with your medicine. What should I watch for while using this medicine? Visit your doctor for checks on your progress. Report any side effects. Continue your course of treatment even though you feel ill unless your doctor tells you to stop. Call your doctor or health care professional for advice if you get a fever, chills or sore throat, or other symptoms of a cold or flu. Do not treat yourself. Try to avoid being around people who are sick. You may experience fever, chills and shaking during your first infusion. These effects are usually mild and can be treated with other medicines. Report any side effects during the infusion to your health care professional. Fever and chills usually do not happen with later infusions.  Do not become pregnant while taking this medicine or for 7 months after stopping it. Women should inform their doctor if they wish to become pregnant or think they might be pregnant. Women of child-bearing potential will need to  have a negative pregnancy test before starting this medicine. There is a potential for serious side effects to an unborn child. Talk to your health care professional or pharmacist for more information. Do not breast-feed an infant while taking this medicine or for 7 months after stopping it. Women must use effective birth control with this medicine. What side effects may I notice from receiving this medicine? Side effects that you should report to your doctor or health care professional as soon as possible: -allergic reactions like skin rash, itching or hives, swelling of the face, lips, or tongue -chest pain or palpitations -cough -dizziness -feeling faint or lightheaded, falls -fever -general ill feeling or flu-like symptoms -signs of worsening heart failure like breathing problems; swelling in your legs and feet -unusually weak or tired Side effects that usually do not require medical attention (report to your doctor or health care professional if they continue or are bothersome): -bone pain -changes in taste -diarrhea -joint pain -nausea/vomiting -weight loss This list may not describe all possible side effects. Call your doctor for medical advice about side effects. You may report side effects to FDA at 1-800-FDA-1088. Where should I keep my medicine? This drug is given in a hospital or clinic and will not be stored at home. NOTE: This sheet is a summary. It may not cover all possible information. If you have questions about this medicine, talk to your doctor, pharmacist, or health care provider.  2018 Elsevier/Gold Standard (2016-03-22 14:37:52)   Pertuzumab (Perjeta) injection What is this medicine? PERTUZUMAB (per TOOZ ue mab) is a monoclonal antibody. It is used to treat breast cancer. This medicine may be used for other purposes; ask your health care provider or pharmacist if you have questions. COMMON BRAND NAME(S): PERJETA What should I tell my health care provider before  I take this medicine? They need to know if you have any of these conditions: -heart disease -heart failure -high blood pressure -history of irregular heart beat -recent or ongoing radiation therapy -an unusual or allergic reaction to pertuzumab, other medicines, foods, dyes, or preservatives -pregnant or trying to get pregnant -breast-feeding How should I use this medicine? This medicine is for infusion into a vein. It is given by a health care professional in a hospital or clinic setting. Talk to your pediatrician regarding the use of this medicine in children. Special care may be needed. Overdosage: If you think you have taken too much of this medicine contact a poison control center or emergency room at once. NOTE: This medicine is only for you. Do not share this medicine with others. What if I miss a dose? It is important not to miss your dose. Call your doctor or health care professional if you are unable to keep an appointment. What may interact with this medicine? Interactions are not expected. Give your health care provider a list of all the medicines, herbs, non-prescription drugs, or dietary supplements you use. Also tell them if you smoke, drink alcohol, or use illegal drugs. Some items may interact with your medicine. This list may not describe all possible interactions. Give your health care provider a list of all the medicines, herbs, non-prescription drugs, or dietary supplements you use. Also tell them if you smoke, drink alcohol, or use illegal drugs.  Some items may interact with your medicine. What should I watch for while using this medicine? Your condition will be monitored carefully while you are receiving this medicine. Report any side effects. Continue your course of treatment even though you feel ill unless your doctor tells you to stop. Do not become pregnant while taking this medicine or for 7 months after stopping it. Women should inform their doctor if they wish to  become pregnant or think they might be pregnant. Women of child-bearing potential will need to have a negative pregnancy test before starting this medicine. There is a potential for serious side effects to an unborn child. Talk to your health care professional or pharmacist for more information. Do not breast-feed an infant while taking this medicine or for 7 months after stopping it. Women must use effective birth control with this medicine. Call your doctor or health care professional for advice if you get a fever, chills or sore throat, or other symptoms of a cold or flu. Do not treat yourself. Try to avoid being around people who are sick. You may experience fever, chills, and headache during the infusion. Report any side effects during the infusion to your health care professional. What side effects may I notice from receiving this medicine? Side effects that you should report to your doctor or health care professional as soon as possible: -breathing problems -chest pain or palpitations -dizziness -feeling faint or lightheaded -fever or chills -skin rash, itching or hives -sore throat -swelling of the face, lips, or tongue -swelling of the legs or ankles -unusually weak or tired Side effects that usually do not require medical attention (report to your doctor or health care professional if they continue or are bothersome): -diarrhea -hair loss -nausea, vomiting -tiredness This list may not describe all possible side effects. Call your doctor for medical advice about side effects. You may report side effects to FDA at 1-800-FDA-1088. Where should I keep my medicine? This drug is given in a hospital or clinic and will not be stored at home. NOTE: This sheet is a summary. It may not cover all possible information. If you have questions about this medicine, talk to your doctor, pharmacist, or health care provider.  2018 Elsevier/Gold Standard (2015-04-30 12:08:50)   Docetaxel (Taxotere)  injection What is this medicine? DOCETAXEL (doe se TAX el) is a chemotherapy drug. It targets fast dividing cells, like cancer cells, and causes these cells to die. This medicine is used to treat many types of cancers like breast cancer, certain stomach cancers, head and neck cancer, lung cancer, and prostate cancer. This medicine may be used for other purposes; ask your health care provider or pharmacist if you have questions. COMMON BRAND NAME(S): Docefrez, Taxotere What should I tell my health care provider before I take this medicine? They need to know if you have any of these conditions: -infection (especially a virus infection such as chickenpox, cold sores, or herpes) -liver disease -low blood counts, like low white cell, platelet, or red cell counts -an unusual or allergic reaction to docetaxel, polysorbate 80, other chemotherapy agents, other medicines, foods, dyes, or preservatives -pregnant or trying to get pregnant -breast-feeding How should I use this medicine? This drug is given as an infusion into a vein. It is administered in a hospital or clinic by a specially trained health care professional. Talk to your pediatrician regarding the use of this medicine in children. Special care may be needed. Overdosage: If you think you have taken too much of  this medicine contact a poison control center or emergency room at once. NOTE: This medicine is only for you. Do not share this medicine with others. What if I miss a dose? It is important not to miss your dose. Call your doctor or health care professional if you are unable to keep an appointment. What may interact with this medicine? -cyclosporine -erythromycin -ketoconazole -medicines to increase blood counts like filgrastim, pegfilgrastim, sargramostim -vaccines Talk to your doctor or health care professional before taking any of these medicines: -acetaminophen -aspirin -ibuprofen -ketoprofen -naproxen This list may not  describe all possible interactions. Give your health care provider a list of all the medicines, herbs, non-prescription drugs, or dietary supplements you use. Also tell them if you smoke, drink alcohol, or use illegal drugs. Some items may interact with your medicine. What should I watch for while using this medicine? Your condition will be monitored carefully while you are receiving this medicine. You will need important blood work done while you are taking this medicine. This drug may make you feel generally unwell. This is not uncommon, as chemotherapy can affect healthy cells as well as cancer cells. Report any side effects. Continue your course of treatment even though you feel ill unless your doctor tells you to stop. In some cases, you may be given additional medicines to help with side effects. Follow all directions for their use. Call your doctor or health care professional for advice if you get a fever, chills or sore throat, or other symptoms of a cold or flu. Do not treat yourself. This drug decreases your body's ability to fight infections. Try to avoid being around people who are sick. This medicine may increase your risk to bruise or bleed. Call your doctor or health care professional if you notice any unusual bleeding. This medicine may contain alcohol in the product. You may get drowsy or dizzy. Do not drive, use machinery, or do anything that needs mental alertness until you know how this medicine affects you. Do not stand or sit up quickly, especially if you are an older patient. This reduces the risk of dizzy or fainting spells. Avoid alcoholic drinks. Do not become pregnant while taking this medicine. Women should inform their doctor if they wish to become pregnant or think they might be pregnant. There is a potential for serious side effects to an unborn child. Talk to your health care professional or pharmacist for more information. Do not breast-feed an infant while taking this  medicine. What side effects may I notice from receiving this medicine? Side effects that you should report to your doctor or health care professional as soon as possible: -allergic reactions like skin rash, itching or hives, swelling of the face, lips, or tongue -low blood counts - This drug may decrease the number of white blood cells, red blood cells and platelets. You may be at increased risk for infections and bleeding. -signs of infection - fever or chills, cough, sore throat, pain or difficulty passing urine -signs of decreased platelets or bleeding - bruising, pinpoint red spots on the skin, black, tarry stools, nosebleeds -signs of decreased red blood cells - unusually weak or tired, fainting spells, lightheadedness -breathing problems -fast or irregular heartbeat -low blood pressure -mouth sores -nausea and vomiting -pain, swelling, redness or irritation at the injection site -pain, tingling, numbness in the hands or feet -swelling of the ankle, feet, hands -weight gain Side effects that usually do not require medical attention (report to your doctor or health care  professional if they continue or are bothersome): -bone pain -complete hair loss including hair on your head, underarms, pubic hair, eyebrows, and eyelashes -diarrhea -excessive tearing -changes in the color of fingernails -loosening of the fingernails -nausea -muscle pain -red flush to skin -sweating -weak or tired This list may not describe all possible side effects. Call your doctor for medical advice about side effects. You may report side effects to FDA at 1-800-FDA-1088. Where should I keep my medicine? This drug is given in a hospital or clinic and will not be stored at home. NOTE: This sheet is a summary. It may not cover all possible information. If you have questions about this medicine, talk to your doctor, pharmacist, or health care provider.  2018 Elsevier/Gold Standard (2015-04-30  12:32:56)   Carboplatin injection What is this medicine? CARBOPLATIN (KAR boe pla tin) is a chemotherapy drug. It targets fast dividing cells, like cancer cells, and causes these cells to die. This medicine is used to treat ovarian cancer and many other cancers. This medicine may be used for other purposes; ask your health care provider or pharmacist if you have questions. COMMON BRAND NAME(S): Paraplatin What should I tell my health care provider before I take this medicine? They need to know if you have any of these conditions: -blood disorders -hearing problems -kidney disease -recent or ongoing radiation therapy -an unusual or allergic reaction to carboplatin, cisplatin, other chemotherapy, other medicines, foods, dyes, or preservatives -pregnant or trying to get pregnant -breast-feeding How should I use this medicine? This drug is usually given as an infusion into a vein. It is administered in a hospital or clinic by a specially trained health care professional. Talk to your pediatrician regarding the use of this medicine in children. Special care may be needed. Overdosage: If you think you have taken too much of this medicine contact a poison control center or emergency room at once. NOTE: This medicine is only for you. Do not share this medicine with others. What if I miss a dose? It is important not to miss a dose. Call your doctor or health care professional if you are unable to keep an appointment. What may interact with this medicine? -medicines for seizures -medicines to increase blood counts like filgrastim, pegfilgrastim, sargramostim -some antibiotics like amikacin, gentamicin, neomycin, streptomycin, tobramycin -vaccines Talk to your doctor or health care professional before taking any of these medicines: -acetaminophen -aspirin -ibuprofen -ketoprofen -naproxen This list may not describe all possible interactions. Give your health care provider a list of all the  medicines, herbs, non-prescription drugs, or dietary supplements you use. Also tell them if you smoke, drink alcohol, or use illegal drugs. Some items may interact with your medicine. What should I watch for while using this medicine? Your condition will be monitored carefully while you are receiving this medicine. You will need important blood work done while you are taking this medicine. This drug may make you feel generally unwell. This is not uncommon, as chemotherapy can affect healthy cells as well as cancer cells. Report any side effects. Continue your course of treatment even though you feel ill unless your doctor tells you to stop. In some cases, you may be given additional medicines to help with side effects. Follow all directions for their use. Call your doctor or health care professional for advice if you get a fever, chills or sore throat, or other symptoms of a cold or flu. Do not treat yourself. This drug decreases your body's ability to fight infections.  Try to avoid being around people who are sick. This medicine may increase your risk to bruise or bleed. Call your doctor or health care professional if you notice any unusual bleeding. Be careful brushing and flossing your teeth or using a toothpick because you may get an infection or bleed more easily. If you have any dental work done, tell your dentist you are receiving this medicine. Avoid taking products that contain aspirin, acetaminophen, ibuprofen, naproxen, or ketoprofen unless instructed by your doctor. These medicines may hide a fever. Do not become pregnant while taking this medicine. Women should inform their doctor if they wish to become pregnant or think they might be pregnant. There is a potential for serious side effects to an unborn child. Talk to your health care professional or pharmacist for more information. Do not breast-feed an infant while taking this medicine. What side effects may I notice from receiving this  medicine? Side effects that you should report to your doctor or health care professional as soon as possible: -allergic reactions like skin rash, itching or hives, swelling of the face, lips, or tongue -signs of infection - fever or chills, cough, sore throat, pain or difficulty passing urine -signs of decreased platelets or bleeding - bruising, pinpoint red spots on the skin, black, tarry stools, nosebleeds -signs of decreased red blood cells - unusually weak or tired, fainting spells, lightheadedness -breathing problems -changes in hearing -changes in vision -chest pain -high blood pressure -low blood counts - This drug may decrease the number of white blood cells, red blood cells and platelets. You may be at increased risk for infections and bleeding. -nausea and vomiting -pain, swelling, redness or irritation at the injection site -pain, tingling, numbness in the hands or feet -problems with balance, talking, walking -trouble passing urine or change in the amount of urine Side effects that usually do not require medical attention (report to your doctor or health care professional if they continue or are bothersome): -hair loss -loss of appetite -metallic taste in the mouth or changes in taste This list may not describe all possible side effects. Call your doctor for medical advice about side effects. You may report side effects to FDA at 1-800-FDA-1088. Where should I keep my medicine? This drug is given in a hospital or clinic and will not be stored at home. NOTE: This sheet is a summary. It may not cover all possible information. If you have questions about this medicine, talk to your doctor, pharmacist, or health care provider.  2018 Elsevier/Gold Standard (2007-07-03 14:38:05)   Pegfilgrastim (Neulasta) injection What is this medicine? PEGFILGRASTIM (PEG fil gra stim) is a long-acting granulocyte colony-stimulating factor that stimulates the growth of neutrophils, a type of  white blood cell important in the body's fight against infection. It is used to reduce the incidence of fever and infection in patients with certain types of cancer who are receiving chemotherapy that affects the bone marrow, and to increase survival after being exposed to high doses of radiation. This medicine may be used for other purposes; ask your health care provider or pharmacist if you have questions. COMMON BRAND NAME(S): Neulasta What should I tell my health care provider before I take this medicine? They need to know if you have any of these conditions: -kidney disease -latex allergy -ongoing radiation therapy -sickle cell disease -skin reactions to acrylic adhesives (On-Body Injector only) -an unusual or allergic reaction to pegfilgrastim, filgrastim, other medicines, foods, dyes, or preservatives -pregnant or trying to get pregnant -breast-feeding  How should I use this medicine? This medicine is for injection under the skin. If you get this medicine at home, you will be taught how to prepare and give the pre-filled syringe or how to use the On-body Injector. Refer to the patient Instructions for Use for detailed instructions. Use exactly as directed. Tell your healthcare provider immediately if you suspect that the On-body Injector may not have performed as intended or if you suspect the use of the On-body Injector resulted in a missed or partial dose. It is important that you put your used needles and syringes in a special sharps container. Do not put them in a trash can. If you do not have a sharps container, call your pharmacist or healthcare provider to get one. Talk to your pediatrician regarding the use of this medicine in children. While this drug may be prescribed for selected conditions, precautions do apply. Overdosage: If you think you have taken too much of this medicine contact a poison control center or emergency room at once. NOTE: This medicine is only for you. Do not  share this medicine with others. What if I miss a dose? It is important not to miss your dose. Call your doctor or health care professional if you miss your dose. If you miss a dose due to an On-body Injector failure or leakage, a new dose should be administered as soon as possible using a single prefilled syringe for manual use. What may interact with this medicine? Interactions have not been studied. Give your health care provider a list of all the medicines, herbs, non-prescription drugs, or dietary supplements you use. Also tell them if you smoke, drink alcohol, or use illegal drugs. Some items may interact with your medicine. This list may not describe all possible interactions. Give your health care provider a list of all the medicines, herbs, non-prescription drugs, or dietary supplements you use. Also tell them if you smoke, drink alcohol, or use illegal drugs. Some items may interact with your medicine. What should I watch for while using this medicine? You may need blood work done while you are taking this medicine. If you are going to need a MRI, CT scan, or other procedure, tell your doctor that you are using this medicine (On-Body Injector only). What side effects may I notice from receiving this medicine? Side effects that you should report to your doctor or health care professional as soon as possible: -allergic reactions like skin rash, itching or hives, swelling of the face, lips, or tongue -dizziness -fever -pain, redness, or irritation at site where injected -pinpoint red spots on the skin -red or dark-brown urine -shortness of breath or breathing problems -stomach or side pain, or pain at the shoulder -swelling -tiredness -trouble passing urine or change in the amount of urine Side effects that usually do not require medical attention (report to your doctor or health care professional if they continue or are bothersome): -bone pain -muscle pain This list may not describe  all possible side effects. Call your doctor for medical advice about side effects. You may report side effects to FDA at 1-800-FDA-1088. Where should I keep my medicine? Keep out of the reach of children. Store pre-filled syringes in a refrigerator between 2 and 8 degrees C (36 and 46 degrees F). Do not freeze. Keep in carton to protect from light. Throw away this medicine if it is left out of the refrigerator for more than 48 hours. Throw away any unused medicine after the expiration date. NOTE:  This sheet is a summary. It may not cover all possible information. If you have questions about this medicine, talk to your doctor, pharmacist, or health care provider.  2018 Elsevier/Gold Standard (2016-03-24 12:58:03)

## 2017-01-10 NOTE — Progress Notes (Signed)
[place] [facility]ual [facility] Note  Followed up with Diane Rosario in infusion to provide support and encouragement for her first chemo tx.  She was drowsy, but reported good care, positive outlook, and support from her uncle.  Per pt, no other needs at this time.  As always, please page as needs arise or circumstances change.  Thank you.   [place], [place], [facility] Pager [phone] Voicemail [phone]

## 2017-01-10 NOTE — Assessment & Plan Note (Addendum)
12/14/2016: Left modified radical mastectomy: IDC grade 3, spans 5 cm and a 2.2 cm with extensive DCIS grade 3, lymphovascular invasion present, margins negative, 17/17 lymph nodes positive, ER 30%, PR 0%, HER-2 positive, Ki-67 20%, T2 N3a M0 stage IIIB AJCC 8  Patient has long-standing issues with bipolar disorder and substance abuse previously. CT CAP  and Bone Scan 11/17/2016: No metastases  11 mm left lower lobe groundglass nodule probably infectious etiology   Treatment plan:  1. Adjuvant chemotherapy with [place] Perjeta every 3 weeks 6 followed by Herceptin Perjeta maintenance for 6 months 3. Followed by adjuvant radiation 4. Followed by adjuvant antiestrogen therapy 5-7 years ------------------------------------------------------------------------- Current treatment: Cycle 1 day 1 [place] Perjeta Chemotherapy education completed Consent obtained Echocardiogram reviewed: Ejection fraction 50% on 12/06/2016 Antiemetics were discussed in detail Labs have been reviewed Monitoring closely for toxicities  Return to clinic in one week for toxicity check and follow-up

## 2017-01-10 NOTE — Progress Notes (Signed)
Patient Care Team: Department, [facility] as PCP - General [name], MD as Consulting Physician (General Surgery) Nicholas Lose, MD as Consulting Physician (Hematology and Oncology) Kyung Rudd, MD as Consulting Physician (Radiation Oncology)  DIAGNOSIS:  Encounter Diagnosis  Name Primary?  . Malignant neoplasm of upper-outer quadrant of left breast in female, estrogen receptor positive ([place])     SUMMARY OF ONCOLOGIC HISTORY:   Malignant neoplasm of upper-outer quadrant of left breast in female, estrogen receptor positive ([place])   11/01/2016 Initial Diagnosis    Palpable left breast mass with 4 enl axillary lymph nodes 4.8 x 0.8 x 3.1 cm; left axillary mass 1.1 x 0.5 cm, lymph node 1.4 x 0.8 cm biopsy grade 2-3 IDC ER 30% PR 0% HER-2 positive ratio 7.53, Ki-67 20%, T2 N2 MX stage IIIa clinical stage      12/02/2016 Genetic Testing    Negative genetic testing on the STAT panel.  The STAT Breast cancer panel offered by [facility] includes sequencing and rearrangement analysis for the following 9 genes:  ATM, BRCA1, BRCA2, CDH1, CHEK2, PALB2, PTEN, STK11 and TP53.   The report date is December 02, 2016.      12/14/2016 Surgery    Left modified radical mastectomy: IDC grade 3, spans 5 cm and a 2.2 cm with extensive DCIS grade 3, lymphovascular invasion present, margins negative, 17/17 lymph nodes positive, ER 30%, PR 0%, HER-2 positive, Ki-67 20%, T2 N3a M0 stage IIIB AJCC 8      01/10/2017 -  Chemotherapy    Adjuvant chemotherapy with TCH Perjeta 6 cycles followed by Herceptin Perjeta maintenance        CHIEF COMPLIANT: Cycle 1 day 1 TCH Perjeta  INTERVAL HISTORY: Diane Rosario is a 41 year old with above-mentioned history of left breast cancer treated with mastectomy and is here to start her first cycle of chemotherapy with [place]. She is anxious about starting chemotherapy but otherwise doing well. She is extremely lean and slightly frail and weak. She did not  pick up her medication until late yesterday and did not take dexamethasone for today.  REVIEW OF SYSTEMS:   Constitutional: Denies fevers, chills or abnormal weight loss Eyes: Denies blurriness of vision Ears, nose, mouth, throat, and face: Denies mucositis or sore throat Respiratory: Denies cough, dyspnea or wheezes Cardiovascular: Denies palpitation, chest discomfort Gastrointestinal:  Denies nausea, heartburn or change in bowel habits Skin: Denies abnormal skin rashes Lymphatics: Denies new lymphadenopathy or easy bruising Neurological:Denies numbness, tingling or new weaknesses Behavioral/Psych: Mood is stable, no new changes  Extremities: No lower extremity edema All other systems were reviewed with the patient and are negative.  I have reviewed the past medical history, past surgical history, social history and family history with the patient and they are unchanged from previous note.  ALLERGIES:  is allergic to asa [aspirin].  MEDICATIONS:  Current Outpatient Prescriptions  Medication Sig Dispense Refill  . ALPRAZolam (XANAX) 0.5 MG tablet Take 0.5 mg by mouth 2 (two) times daily as needed for anxiety.     Marland Kitchen dexamethasone (DECADRON) 4 MG tablet Take 1 tablet (4 mg total) by mouth daily. Take 1 tablet day before chemotherapy and 1 tablet the day after chemotherapy 30 tablet 1  . ENSURE (ENSURE) Take 237 mLs by mouth daily.    . haloperidol (HALDOL) 10 MG tablet Take 10 mg by mouth at bedtime.   1  . HYDROcodone-acetaminophen (NORCO/VICODIN) 5-325 MG tablet Take 1-2 tablets by mouth every 4 (four) hours as  needed for moderate pain. 20 tablet 0  . lidocaine-prilocaine (EMLA) cream Apply to affected area once 30 g 3  . LORazepam (ATIVAN) 0.5 MG tablet Take 1 tablet (0.5 mg total) by mouth at bedtime. 30 tablet 0  . ondansetron (ZOFRAN) 8 MG tablet Take 1 tablet (8 mg total) by mouth 2 (two) times daily as needed for refractory nausea / vomiting. Start on day 3 after chemo. 30 tablet  1  . prochlorperazine (COMPAZINE) 10 MG tablet Take 1 tablet (10 mg total) by mouth every 6 (six) hours as needed (Nausea or vomiting). 30 tablet 1  . risperiDONE microspheres (RISPERDAL CONSTA) 50 MG injection Inject 50 mg into the muscle every 14 (fourteen) days.     No current facility-administered medications for this visit.    Facility-Administered Medications Ordered in Other Visits  Medication Dose Route Frequency Provider Last Rate Last Dose  . sodium chloride flush (NS) 0.9 % injection 10 mL  10 mL Intracatheter PRN Nicholas Lose, MD        PHYSICAL EXAMINATION: ECOG PERFORMANCE STATUS: 2 - Symptomatic, <50% confined to bed  Vitals:   01/10/17 0927  BP: 98/73  Pulse: 79  Resp: 18  Temp: 97.7 F (36.5 C)  SpO2: 100%   Filed Weights   01/10/17 0927  Weight: 96 lb 11.2 oz (43.9 kg)   GENERAL:alert, no distress and comfortable SKIN: skin color, texture, turgor are normal, no rashes or significant lesions EYES: normal, Conjunctiva are pink and non-injected, sclera clear OROPHARYNX:no exudate, no erythema and lips, buccal mucosa, and tongue normal  NECK: supple, thyroid normal size, non-tender, without nodularity LYMPH:  no palpable lymphadenopathy in the cervical, axillary or inguinal LUNGS: clear to auscultation and percussion with normal breathing effort HEART: regular rate & rhythm and no murmurs and no lower extremity edema ABDOMEN:abdomen soft, non-tender and normal bowel sounds MUSCULOSKELETAL:no cyanosis of digits and no clubbing  NEURO: alert & oriented x 3 with fluent speech, no focal motor/sensory deficits EXTREMITIES: No lower extremity edema  LABORATORY DATA:  I have reviewed the data as listed   Chemistry      Component Value Date/Time   NA 137 12/15/2016 0532   NA 141 11/09/2016 1241   K 4.5 12/15/2016 0532   K 3.7 11/09/2016 1241   CL 104 12/15/2016 0532   CO2 27 12/15/2016 0532   CO2 31 (H) 11/09/2016 1241   BUN 8 12/15/2016 0532   BUN 13.8  11/09/2016 1241   CREATININE 0.67 12/15/2016 0532   CREATININE 1.1 11/09/2016 1241      Component Value Date/Time   CALCIUM 9.0 12/15/2016 0532   CALCIUM 10.0 11/09/2016 1241   ALKPHOS 62 12/06/2016 1339   ALKPHOS 74 11/09/2016 1241   AST 22 12/06/2016 1339   AST 26 11/09/2016 1241   ALT 13 (L) 12/06/2016 1339   ALT 11 11/09/2016 1241   BILITOT 0.3 12/06/2016 1339   BILITOT 0.28 11/09/2016 1241       Lab Results  Component Value Date   WBC 6.6 01/10/2017   HGB 11.8 01/10/2017   HCT 37.3 01/10/2017   MCV 101.9 (H) 01/10/2017   PLT 220 01/10/2017   NEUTROABS 2.6 01/10/2017    ASSESSMENT & PLAN:  Malignant neoplasm of upper-outer quadrant of left breast in female, estrogen receptor positive ([place]) 12/14/2016: Left modified radical mastectomy: IDC grade 3, spans 5 cm and a 2.2 cm with extensive DCIS grade 3, lymphovascular invasion present, margins negative, 17/17 lymph nodes positive, ER 30%, PR  0%, HER-2 positive, Ki-67 20%, T2 N3a M0 stage IIIB AJCC 8  Patient has long-standing issues with bipolar disorder and substance abuse previously. CT CAP  and Bone Scan 11/17/2016: No metastases  11 mm left lower lobe groundglass nodule probably infectious etiology   Treatment plan:  1. Adjuvant chemotherapy with [place] Perjeta every 3 weeks 6 followed by Herceptin Perjeta maintenance for 6 months 3. Followed by adjuvant radiation 4. Followed by adjuvant antiestrogen therapy 5-7 years ------------------------------------------------------------------------- Current treatment: Cycle 1 day 1 [place] Perjeta Chemotherapy education completed Consent obtained Echocardiogram reviewed: Ejection fraction 50% on 12/06/2016 Antiemetics were discussed in detail Labs have been reviewed Monitoring closely for toxicities  Return to clinic in one week for toxicity check and follow-up  I spent 25 minutes talking to the patient of which more than half was spent in counseling and coordination of  care.  No orders of the defined types were placed in this encounter.  The patient has a good understanding of the overall plan. she agrees with it. she will call with any problems that may develop before the next visit here.   Rulon Eisenmenger, MD 01/10/17

## 2017-01-11 ENCOUNTER — Telehealth: Payer: Self-pay

## 2017-01-11 NOTE — Telephone Encounter (Signed)
Called and spoke with pt to follow up day 1 after chemo. Pt states that she is doing okay and has all her medication for nausea as needed. Reviewed home management after chemo and close monitoring of side effects and when to seek help. Pt hydrating well and has no current issue or concern at this time. Confirmed follow up appt for labs next week for nadir check. No further needs at this time.

## 2017-01-16 ENCOUNTER — Encounter: Payer: Self-pay | Admitting: Hematology and Oncology

## 2017-01-16 ENCOUNTER — Other Ambulatory Visit: Payer: Self-pay

## 2017-01-16 DIAGNOSIS — C50812 Malignant neoplasm of overlapping sites of left female breast: Secondary | ICD-10-CM

## 2017-01-16 NOTE — Progress Notes (Signed)
Called pt to introduce myself as her Arboriculturist and to inform her on the [employer].  Pt's insurance will pay for her treatment at 100%.  Left msg requesting she return my call at her earliest convenience.

## 2017-01-17 ENCOUNTER — Ambulatory Visit: Payer: Medicaid Other | Admitting: Hematology and Oncology

## 2017-01-17 ENCOUNTER — Other Ambulatory Visit: Payer: Medicaid Other

## 2017-01-17 ENCOUNTER — Telehealth: Payer: Self-pay

## 2017-01-17 ENCOUNTER — Encounter: Payer: Self-pay | Admitting: Hematology and Oncology

## 2017-01-17 ENCOUNTER — Telehealth: Payer: Self-pay | Admitting: Hematology and Oncology

## 2017-01-17 NOTE — Telephone Encounter (Signed)
Rescheduled appointments 10/9 to 10/11.

## 2017-01-17 NOTE — Telephone Encounter (Signed)
Called pt to follow up her missed appt today for 1 week nadir check. LVM with call back number to call and reschedule for this week.

## 2017-01-17 NOTE — Assessment & Plan Note (Deleted)
12/14/2016: Left modified radical mastectomy: IDC grade 3, spans 5 cm and a 2.2 cm with extensive DCIS grade 3, lymphovascular invasion present, margins negative, 17/17 lymph nodes positive, ER 30%, PR 0%, HER-2 positive, Ki-67 20%, T2 N3a M0 stage IIIB AJCC 8  Patient has long-standing issues with bipolar disorder and substance abuse previously. CT CAP and Bone Scan 11/17/2016: No metastases 11 mm left lower lobe groundglass nodule probably infectious etiology   Treatment plan:  1. Adjuvant chemotherapy with [place] Perjeta every 3 weeks 6 followed by Herceptin Perjeta maintenance for 6 months 3. Followed by adjuvant radiation 4. Followed by adjuvant antiestrogen therapy 5-7 years ------------------------------------------------------------------------- Current treatment: Cycle 1 day 8 TCH Perjeta  Chemotherapy toxicities:  Return to clinic in 2 weeks for cycle 2

## 2017-01-17 NOTE — Telephone Encounter (Signed)
error 

## 2017-01-17 NOTE — Progress Notes (Signed)
Pt returned my call so I informed her of the [employer] and went over what it covers.  Pt would like to apply so she will bring her proof of income on 01/19/17.  If approved I will give her an expense sheet.

## 2017-01-19 ENCOUNTER — Other Ambulatory Visit: Payer: Medicaid Other

## 2017-01-19 ENCOUNTER — Ambulatory Visit: Payer: Medicaid Other | Admitting: Hematology and Oncology

## 2017-01-20 ENCOUNTER — Other Ambulatory Visit: Payer: Self-pay | Admitting: *Deleted

## 2017-01-20 DIAGNOSIS — Z170 Estrogen receptor positive status [ER+]: Principal | ICD-10-CM

## 2017-01-20 DIAGNOSIS — C50412 Malignant neoplasm of upper-outer quadrant of left female breast: Secondary | ICD-10-CM

## 2017-01-23 ENCOUNTER — Ambulatory Visit: Payer: Medicaid Other

## 2017-01-23 ENCOUNTER — Other Ambulatory Visit (HOSPITAL_BASED_OUTPATIENT_CLINIC_OR_DEPARTMENT_OTHER): Payer: Medicaid Other

## 2017-01-23 ENCOUNTER — Ambulatory Visit (HOSPITAL_BASED_OUTPATIENT_CLINIC_OR_DEPARTMENT_OTHER): Payer: Medicaid Other | Admitting: Hematology and Oncology

## 2017-01-23 DIAGNOSIS — R110 Nausea: Secondary | ICD-10-CM | POA: Diagnosis not present

## 2017-01-23 DIAGNOSIS — Z170 Estrogen receptor positive status [ER+]: Secondary | ICD-10-CM

## 2017-01-23 DIAGNOSIS — R197 Diarrhea, unspecified: Secondary | ICD-10-CM

## 2017-01-23 DIAGNOSIS — R911 Solitary pulmonary nodule: Secondary | ICD-10-CM

## 2017-01-23 DIAGNOSIS — C50412 Malignant neoplasm of upper-outer quadrant of left female breast: Secondary | ICD-10-CM

## 2017-01-23 DIAGNOSIS — C773 Secondary and unspecified malignant neoplasm of axilla and upper limb lymph nodes: Secondary | ICD-10-CM

## 2017-01-23 DIAGNOSIS — F1410 Cocaine abuse, uncomplicated: Secondary | ICD-10-CM

## 2017-01-23 DIAGNOSIS — F1210 Cannabis abuse, uncomplicated: Secondary | ICD-10-CM

## 2017-01-23 LAB — COMPREHENSIVE METABOLIC PANEL
ALT: 13 U/L (ref 0–55)
AST: 19 U/L (ref 5–34)
Albumin: 3.5 g/dL (ref 3.5–5.0)
Alkaline Phosphatase: 67 U/L (ref 40–150)
Anion Gap: 7 mEq/L (ref 3–11)
BUN: 13.6 mg/dL (ref 7.0–26.0)
CO2: 30 mEq/L — ABNORMAL HIGH (ref 22–29)
Calcium: 8.8 mg/dL (ref 8.4–10.4)
Chloride: 104 mEq/L (ref 98–109)
Creatinine: 0.9 mg/dL (ref 0.6–1.1)
EGFR: >60 mL/min/{1.73_m2} (ref >60)
Glucose: 87 mg/dl (ref 70–140)
Potassium: 3.8 mEq/L (ref 3.5–5.1)
Sodium: 141 mEq/L (ref 136–145)
Total Bilirubin: <0.22 mg/dL (ref 0.20–1.20)
Total Protein: 7 g/dL (ref 6.4–8.3)

## 2017-01-23 LAB — CBC WITH DIFFERENTIAL/PLATELET
BASO%: 0.1 % (ref 0.0–2.0)
Basophils Absolute: 0 10*3/uL (ref 0.0–0.1)
EOS%: 0.1 % (ref 0.0–7.0)
Eosinophils Absolute: 0 10*3/uL (ref 0.0–0.5)
HCT: 35.1 % (ref 34.8–46.6)
HGB: 11.4 g/dL — ABNORMAL LOW (ref 11.6–15.9)
LYMPH%: 28.2 % (ref 14.0–49.7)
MCH: 32 pg (ref 25.1–34.0)
MCHC: 32.5 g/dL (ref 31.5–36.0)
MCV: 98.6 fL (ref 79.5–101.0)
MONO#: 1.2 10*3/uL — ABNORMAL HIGH (ref 0.1–0.9)
MONO%: 16.6 % — ABNORMAL HIGH (ref 0.0–14.0)
NEUT#: 3.9 10*3/uL (ref 1.5–6.5)
NEUT%: 55 % (ref 38.4–76.8)
Platelets: 313 10*3/uL (ref 145–400)
RBC: 3.56 10*6/uL — ABNORMAL LOW (ref 3.70–5.45)
RDW: 12.1 % (ref 11.2–14.5)
WBC: 7.1 10*3/uL (ref 3.9–10.3)
lymph#: 2 10*3/uL (ref 0.9–3.3)

## 2017-01-23 MED ORDER — HEPARIN SOD (PORK) LOCK FLUSH 100 UNIT/ML IV SOLN
500.0000 [IU] | Freq: Once | INTRAVENOUS | Status: AC
  Administered 2017-01-23: 500 [IU]
  Filled 2017-01-23: qty 5

## 2017-01-23 MED ORDER — SODIUM CHLORIDE 0.9 % IJ SOLN
10.0000 mL | Freq: Once | INTRAMUSCULAR | Status: AC
  Administered 2017-01-23: 10 mL
  Filled 2017-01-23: qty 10

## 2017-01-23 NOTE — Assessment & Plan Note (Signed)
12/14/2016: Left modified radical mastectomy: IDC grade 3, spans 5 cm and a 2.2 cm with extensive DCIS grade 3, lymphovascular invasion present, margins negative, 17/17 lymph nodes positive, ER 30%, PR 0%, HER-2 positive, Ki-67 20%, T2 N3a M0 stage IIIB AJCC 8  Patient has long-standing issues with bipolar disorder and substance abuse previously. CT CAP and Bone Scan 11/17/2016: No metastases 11 mm left lower lobe groundglass nodule probably infectious etiology   Treatment plan:  1. Adjuvant chemotherapy with [place] Perjeta every 3 weeks 6 followed by Herceptin Perjeta maintenance for 1 year 3. Followed by adjuvant radiation 4. Followed by adjuvant antiestrogen therapy 5-7 years ------------------------------------------------------------------------- Current treatment: Cycle 1 day 8 TCH Perjeta Echocardiogram: Ejection fraction 50% on 12/06/2016  Chemotherapy toxicities:  Return to clinic in 2 weeks for cycle 2

## 2017-01-23 NOTE — Progress Notes (Signed)
Patient Care Team: Department, [facility] as PCP - General [name], MD as Consulting Physician (General Surgery) Nicholas Lose, MD as Consulting Physician (Hematology and Oncology) Kyung Rudd, MD as Consulting Physician (Radiation Oncology)  DIAGNOSIS:  Encounter Diagnosis  Name Primary?  . Malignant neoplasm of upper-outer quadrant of left breast in female, estrogen receptor positive ([place])     SUMMARY OF ONCOLOGIC HISTORY:   Malignant neoplasm of upper-outer quadrant of left breast in female, estrogen receptor positive ([place])   11/01/2016 Initial Diagnosis    Palpable left breast mass with 4 enl axillary lymph nodes 4.8 x 0.8 x 3.1 cm; left axillary mass 1.1 x 0.5 cm, lymph node 1.4 x 0.8 cm biopsy grade 2-3 IDC ER 30% PR 0% HER-2 positive ratio 7.53, Ki-67 20%, T2 N2 MX stage IIIa clinical stage      12/02/2016 Genetic Testing    Negative genetic testing on the STAT panel.  The STAT Breast cancer panel offered by [facility] includes sequencing and rearrangement analysis for the following 9 genes:  ATM, BRCA1, BRCA2, CDH1, CHEK2, PALB2, PTEN, STK11 and TP53.   The report date is December 02, 2016.      12/14/2016 Surgery    Left modified radical mastectomy: IDC grade 3, spans 5 cm and a 2.2 cm with extensive DCIS grade 3, lymphovascular invasion present, margins negative, 17/17 lymph nodes positive, ER 30%, PR 0%, HER-2 positive, Ki-67 20%, T2 N3a M0 stage IIIB AJCC 8      01/10/2017 -  Chemotherapy    Adjuvant chemotherapy with TCH Perjeta 6 cycles followed by Herceptin Perjeta maintenance        CHIEF COMPLIANT: cycle 1 day 8 TCH Perjeta  INTERVAL HISTORY: ANEIRA Rosario is a 41 year old with above-mentioned history left breast cancer underwent modified mastectomy and is now on adjuvant chemotherapy with [place] she appears to have tolerated the treatment extremely well. She had nausea for a couple of days. She also had diarrhea for 2 days. However she did  not take Imodium.  REVIEW OF SYSTEMS:   Constitutional: Denies fevers, chills or abnormal weight loss Eyes: Denies blurriness of vision Ears, nose, mouth, throat, and face: Denies mucositis or sore throat Respiratory: Denies cough, dyspnea or wheezes Cardiovascular: Denies palpitation, chest discomfort Gastrointestinal:  Denies nausea, heartburn or change in bowel habits Skin: Denies abnormal skin rashes Lymphatics: Denies new lymphadenopathy or easy bruising Neurological:Denies numbness, tingling or new weaknesses Behavioral/Psych: Mood is stable, no new changes  Extremities: No lower extremity edema All other systems were reviewed with the patient and are negative.  I have reviewed the past medical history, past surgical history, social history and family history with the patient and they are unchanged from previous note.  ALLERGIES:  is allergic to asa [aspirin].  MEDICATIONS:  Current Outpatient Prescriptions  Medication Sig Dispense Refill  . ALPRAZolam (XANAX) 0.5 MG tablet Take 0.5 mg by mouth 2 (two) times daily as needed for anxiety.     Marland Kitchen dexamethasone (DECADRON) 4 MG tablet Take 1 tablet (4 mg total) by mouth daily. Take 1 tablet day before chemotherapy and 1 tablet the day after chemotherapy 30 tablet 1  . ENSURE (ENSURE) Take 237 mLs by mouth daily.    . haloperidol (HALDOL) 10 MG tablet Take 10 mg by mouth at bedtime.   1  . HYDROcodone-acetaminophen (NORCO/VICODIN) 5-325 MG tablet Take 1-2 tablets by mouth every 4 (four) hours as needed for moderate pain. 20 tablet 0  . lidocaine-prilocaine (EMLA)  cream Apply to affected area once 30 g 3  . LORazepam (ATIVAN) 0.5 MG tablet Take 1 tablet (0.5 mg total) by mouth at bedtime. 30 tablet 0  . ondansetron (ZOFRAN) 8 MG tablet Take 1 tablet (8 mg total) by mouth 2 (two) times daily as needed for refractory nausea / vomiting. Start on day 3 after chemo. 30 tablet 1  . prochlorperazine (COMPAZINE) 10 MG tablet Take 1 tablet (10 mg  total) by mouth every 6 (six) hours as needed (Nausea or vomiting). 30 tablet 1  . risperiDONE microspheres (RISPERDAL CONSTA) 50 MG injection Inject 50 mg into the muscle every 14 (fourteen) days.     No current facility-administered medications for this visit.    Facility-Administered Medications Ordered in Other Visits  Medication Dose Route Frequency Provider Last Rate Last Dose  . sodium chloride flush (NS) 0.9 % injection 10 mL  10 mL Intracatheter PRN Nicholas Lose, MD        PHYSICAL EXAMINATION: ECOG PERFORMANCE STATUS: 1 - Symptomatic but completely ambulatory  Vitals:   01/23/17 1530  BP: 94/63  Pulse: 94  Resp: 17  Temp: 98.1 F (36.7 C)  SpO2: 100%   Filed Weights   01/23/17 1530  Weight: 91 lb 6.4 oz (41.5 kg)    GENERAL:alert, no distress and comfortable SKIN: skin color, texture, turgor are normal, no rashes or significant lesions EYES: normal, Conjunctiva are pink and non-injected, sclera clear OROPHARYNX:no exudate, no erythema and lips, buccal mucosa, and tongue normal  NECK: supple, thyroid normal size, non-tender, without nodularity LYMPH:  no palpable lymphadenopathy in the cervical, axillary or inguinal LUNGS: clear to auscultation and percussion with normal breathing effort HEART: regular rate & rhythm and no murmurs and no lower extremity edema ABDOMEN:abdomen soft, non-tender and normal bowel sounds MUSCULOSKELETAL:no cyanosis of digits and no clubbing  NEURO: alert & oriented x 3 with fluent speech, no focal motor/sensory deficits EXTREMITIES: No lower extremity edema  LABORATORY DATA:  I have reviewed the data as listed   Chemistry      Component Value Date/Time   NA 143 01/10/2017 0850   K 3.9 01/10/2017 0850   CL 104 12/15/2016 0532   CO2 26 01/10/2017 0850   BUN 12.7 01/10/2017 0850   CREATININE 0.9 01/10/2017 0850      Component Value Date/Time   CALCIUM 9.4 01/10/2017 0850   ALKPHOS 68 01/10/2017 0850   AST 17 01/10/2017 0850    ALT 17 01/10/2017 0850   BILITOT <0.22 01/10/2017 0850       Lab Results  Component Value Date   WBC 7.1 01/23/2017   HGB 11.4 (L) 01/23/2017   HCT 35.1 01/23/2017   MCV 98.6 01/23/2017   PLT 313 01/23/2017   NEUTROABS 3.9 01/23/2017    ASSESSMENT & PLAN:  Malignant neoplasm of upper-outer quadrant of left breast in female, estrogen receptor positive ([place]) 12/14/2016: Left modified radical mastectomy: IDC grade 3, spans 5 cm and a 2.2 cm with extensive DCIS grade 3, lymphovascular invasion present, margins negative, 17/17 lymph nodes positive, ER 30%, PR 0%, HER-2 positive, Ki-67 20%, T2 N3a M0 stage IIIB AJCC 8  Patient has long-standing issues with bipolar disorder and substance abuse previously. CT CAP and Bone Scan 11/17/2016: No metastases 11 mm left lower lobe groundglass nodule probably infectious etiology   Treatment plan:  1. Adjuvant chemotherapy with [place] Perjeta every 3 weeks 6 followed by Herceptin Perjeta maintenance for 1 year 3. Followed by adjuvant radiation 4. Followed by  adjuvant antiestrogen therapy 5-7 years ------------------------------------------------------------------------- Current treatment: Cycle 1 day 8 TCH Perjeta Echocardiogram: Ejection fraction 50% on 12/06/2016  Chemotherapy toxicities: 1. Chemotherapy-induced nausea for 2 days 2. Perjeta induced diarrhea: Instructed her regarding pneumonia  Patient's caregiver discuss with me about her cocaine and marijuana use.I discussed with her that it no time she should be taking recreational drugs.  Return to clinic in 2 weeks for cycle 2   I spent 25 minutes talking to the patient of which more than half was spent in counseling and coordination of care.  No orders of the defined types were placed in this encounter.  The patient has a good understanding of the overall plan. she agrees with it. she will call with any problems that may develop before the next visit here.   Rulon Eisenmenger,  MD 01/23/17

## 2017-01-31 ENCOUNTER — Telehealth: Payer: Self-pay | Admitting: Adult Health

## 2017-01-31 ENCOUNTER — Ambulatory Visit (HOSPITAL_BASED_OUTPATIENT_CLINIC_OR_DEPARTMENT_OTHER): Payer: Medicaid Other | Admitting: Adult Health

## 2017-01-31 ENCOUNTER — Ambulatory Visit: Payer: Medicaid Other | Admitting: Hematology and Oncology

## 2017-01-31 ENCOUNTER — Encounter: Payer: Self-pay | Admitting: Adult Health

## 2017-01-31 ENCOUNTER — Other Ambulatory Visit (HOSPITAL_BASED_OUTPATIENT_CLINIC_OR_DEPARTMENT_OTHER): Payer: Medicaid Other

## 2017-01-31 ENCOUNTER — Ambulatory Visit (HOSPITAL_BASED_OUTPATIENT_CLINIC_OR_DEPARTMENT_OTHER): Payer: Medicaid Other

## 2017-01-31 ENCOUNTER — Ambulatory Visit: Payer: Medicaid Other

## 2017-01-31 ENCOUNTER — Other Ambulatory Visit (HOSPITAL_COMMUNITY)
Admission: RE | Admit: 2017-01-31 | Discharge: 2017-01-31 | Disposition: A | Discharge status: Home / Self Care | Payer: Medicaid Other | Source: Ambulatory Visit | Attending: Hematology and Oncology | Admitting: Hematology and Oncology

## 2017-01-31 VITALS — BP 85/55 | HR 88 | Temp 98.1°F | Resp 18 | Ht 66.0 in | Wt 93.6 lb

## 2017-01-31 DIAGNOSIS — C773 Secondary and unspecified malignant neoplasm of axilla and upper limb lymph nodes: Secondary | ICD-10-CM

## 2017-01-31 DIAGNOSIS — Z5112 Encounter for antineoplastic immunotherapy: Secondary | ICD-10-CM | POA: Diagnosis present

## 2017-01-31 DIAGNOSIS — Z170 Estrogen receptor positive status [ER+]: Secondary | ICD-10-CM | POA: Diagnosis not present

## 2017-01-31 DIAGNOSIS — C50812 Malignant neoplasm of overlapping sites of left female breast: Secondary | ICD-10-CM

## 2017-01-31 DIAGNOSIS — F319 Bipolar disorder, unspecified: Secondary | ICD-10-CM | POA: Diagnosis not present

## 2017-01-31 DIAGNOSIS — C50412 Malignant neoplasm of upper-outer quadrant of left female breast: Secondary | ICD-10-CM

## 2017-01-31 DIAGNOSIS — Z720 Tobacco use: Secondary | ICD-10-CM | POA: Diagnosis not present

## 2017-01-31 DIAGNOSIS — Z5111 Encounter for antineoplastic chemotherapy: Secondary | ICD-10-CM | POA: Diagnosis not present

## 2017-01-31 DIAGNOSIS — Z95828 Presence of other vascular implants and grafts: Secondary | ICD-10-CM

## 2017-01-31 DIAGNOSIS — R911 Solitary pulmonary nodule: Secondary | ICD-10-CM | POA: Diagnosis not present

## 2017-01-31 LAB — CBC WITH DIFFERENTIAL/PLATELET
BASO%: 0.4 % (ref 0.0–2.0)
Basophils Absolute: 0 10*3/uL (ref 0.0–0.1)
EOS%: 0 % (ref 0.0–7.0)
Eosinophils Absolute: 0 10*3/uL (ref 0.0–0.5)
HCT: 33.8 % — ABNORMAL LOW (ref 34.8–46.6)
HGB: 11.1 g/dL — ABNORMAL LOW (ref 11.6–15.9)
LYMPH%: 59.2 % — ABNORMAL HIGH (ref 14.0–49.7)
MCH: 32.3 pg (ref 25.1–34.0)
MCHC: 32.8 g/dL (ref 31.5–36.0)
MCV: 98.3 fL (ref 79.5–101.0)
MONO#: 0.4 10*3/uL (ref 0.1–0.9)
MONO%: 7.3 % (ref 0.0–14.0)
NEUT#: 1.8 10*3/uL (ref 1.5–6.5)
NEUT%: 33.1 % — ABNORMAL LOW (ref 38.4–76.8)
Platelets: 178 10*3/uL (ref 145–400)
RBC: 3.44 10*6/uL — ABNORMAL LOW (ref 3.70–5.45)
RDW: 12.2 % (ref 11.2–14.5)
WBC: 5.5 10*3/uL (ref 3.9–10.3)
lymph#: 3.3 10*3/uL (ref 0.9–3.3)

## 2017-01-31 LAB — COMPREHENSIVE METABOLIC PANEL
ALT: 14 U/L (ref 0–55)
AST: 21 U/L (ref 5–34)
Albumin: 3.4 g/dL — ABNORMAL LOW (ref 3.5–5.0)
Alkaline Phosphatase: 65 U/L (ref 40–150)
Anion Gap: 7 mEq/L (ref 3–11)
BUN: 15.2 mg/dL (ref 7.0–26.0)
CO2: 28 mEq/L (ref 22–29)
Calcium: 8.8 mg/dL (ref 8.4–10.4)
Chloride: 105 mEq/L (ref 98–109)
Creatinine: 0.9 mg/dL (ref 0.6–1.1)
EGFR: >60 mL/min/{1.73_m2} (ref >60)
Glucose: 99 mg/dl (ref 70–140)
Potassium: 3.3 mEq/L — ABNORMAL LOW (ref 3.5–5.1)
Sodium: 140 mEq/L (ref 136–145)
Total Bilirubin: <0.22 mg/dL (ref 0.20–1.20)
Total Protein: 6.5 g/dL (ref 6.4–8.3)

## 2017-01-31 LAB — PREGNANCY, URINE: Preg Test, Ur: NEGATIVE

## 2017-01-31 MED ORDER — CARBOPLATIN CHEMO INJECTION 600 MG/60ML
400.0000 mg | Freq: Once | INTRAVENOUS | Status: AC
  Administered 2017-01-31: 400 mg via INTRAVENOUS
  Filled 2017-01-31: qty 40

## 2017-01-31 MED ORDER — HEPARIN SOD (PORK) LOCK FLUSH 100 UNIT/ML IV SOLN
500.0000 [IU] | Freq: Once | INTRAVENOUS | Status: AC | PRN
  Administered 2017-01-31: 500 [IU]
  Filled 2017-01-31: qty 5

## 2017-01-31 MED ORDER — DEXAMETHASONE SODIUM PHOSPHATE 10 MG/ML IJ SOLN
INTRAMUSCULAR | Status: AC
  Filled 2017-01-31: qty 1

## 2017-01-31 MED ORDER — DEXAMETHASONE SODIUM PHOSPHATE 10 MG/ML IJ SOLN
10.0000 mg | Freq: Once | INTRAMUSCULAR | Status: AC
  Administered 2017-01-31: 10 mg via INTRAVENOUS

## 2017-01-31 MED ORDER — SODIUM CHLORIDE 0.9% FLUSH
10.0000 mL | INTRAVENOUS | Status: DC | PRN
  Administered 2017-01-31: 10 mL via INTRAVENOUS
  Filled 2017-01-31: qty 10

## 2017-01-31 MED ORDER — DIPHENHYDRAMINE HCL 25 MG PO CAPS
ORAL_CAPSULE | ORAL | Status: AC
  Filled 2017-01-31: qty 2

## 2017-01-31 MED ORDER — PALONOSETRON HCL INJECTION 0.25 MG/5ML
0.2500 mg | Freq: Once | INTRAVENOUS | Status: AC
  Administered 2017-01-31: 0.25 mg via INTRAVENOUS

## 2017-01-31 MED ORDER — DIPHENHYDRAMINE HCL 25 MG PO CAPS
50.0000 mg | ORAL_CAPSULE | Freq: Once | ORAL | Status: AC
  Administered 2017-01-31: 50 mg via ORAL

## 2017-01-31 MED ORDER — PEGFILGRASTIM 6 MG/0.6ML ~~LOC~~ PSKT
6.0000 mg | PREFILLED_SYRINGE | Freq: Once | SUBCUTANEOUS | Status: AC
  Administered 2017-01-31: 6 mg via SUBCUTANEOUS
  Filled 2017-01-31: qty 0.6

## 2017-01-31 MED ORDER — SODIUM CHLORIDE 0.9 % IV SOLN
Freq: Once | INTRAVENOUS | Status: AC
  Administered 2017-01-31: 12:00:00 via INTRAVENOUS

## 2017-01-31 MED ORDER — SODIUM CHLORIDE 0.9% FLUSH
10.0000 mL | INTRAVENOUS | Status: DC | PRN
  Administered 2017-01-31: 10 mL
  Filled 2017-01-31: qty 10

## 2017-01-31 MED ORDER — TRASTUZUMAB CHEMO 150 MG IV SOLR
6.0000 mg/kg | Freq: Once | INTRAVENOUS | Status: AC
  Administered 2017-01-31: 252 mg via INTRAVENOUS
  Filled 2017-01-31: qty 12

## 2017-01-31 MED ORDER — SODIUM CHLORIDE 0.9 % IV SOLN
65.0000 mg/m2 | Freq: Once | INTRAVENOUS | Status: AC
  Administered 2017-01-31: 90 mg via INTRAVENOUS
  Filled 2017-01-31: qty 9

## 2017-01-31 MED ORDER — SODIUM CHLORIDE 0.9 % IV SOLN
420.0000 mg | Freq: Once | INTRAVENOUS | Status: AC
  Administered 2017-01-31: 420 mg via INTRAVENOUS
  Filled 2017-01-31: qty 14

## 2017-01-31 MED ORDER — SODIUM CHLORIDE 0.9 % IV SOLN: Freq: Once | INTRAVENOUS | Status: DC

## 2017-01-31 MED ORDER — ACETAMINOPHEN 325 MG PO TABS
650.0000 mg | ORAL_TABLET | Freq: Once | ORAL | Status: AC
  Administered 2017-01-31: 650 mg via ORAL

## 2017-01-31 MED ORDER — PALONOSETRON HCL INJECTION 0.25 MG/5ML
INTRAVENOUS | Status: AC
  Filled 2017-01-31: qty 5

## 2017-01-31 MED ORDER — ACETAMINOPHEN 325 MG PO TABS
ORAL_TABLET | ORAL | Status: AC
  Filled 2017-01-31: qty 2

## 2017-01-31 NOTE — Telephone Encounter (Signed)
Scheduled appts per 10/23 los - patient will get updated schedule in infusion.

## 2017-01-31 NOTE — Assessment & Plan Note (Addendum)
12/14/2016: Left modified radical mastectomy: IDC grade 3, spans 5 cm and a 2.2 cm with extensive DCIS grade 3, lymphovascular invasion present, margins negative, 17/17 lymph nodes positive, ER 30%, PR 0%, HER-2 positive, Ki-67 20%, T2 N3a M0 stage IIIB AJCC 8  Patient has long-standing issues with bipolar disorder and substance abuse previously. CT CAP and Bone Scan 11/17/2016: No metastases 11 mm left lower lobe groundglass nodule probably infectious etiology   Treatment plan:  1. Adjuvant chemotherapy with [place] Perjeta every 3 weeks 6 followed by Herceptin Perjeta maintenance for 1 year 3. Followed by adjuvant radiation 4. Followed by adjuvant antiestrogen therapy 5-7 years ------------------------------------------------------------------------- Current treatment: Cycle 2 day 1 TCH Perjeta Echocardiogram: Ejection fraction 50% on 12/06/2016, f/u with Dr. Haroldine Laws in 02/2017  Chemotherapy toxicities:  She is beginning to lose her hair which is normal for this time period.    Diane Rosario is slightly hypotensive today.  She tells me that is normal for her bp to be in the 91Q or 94H systolic, and she is asymptomatic.  I did order for some fluids to run in during the down time when she is receiving her treatments.  She will proceed with her treatment today.  Diane Rosario will return in three weeks for labs, f/u with Dr. Lindi Adie, and cycle 3 of TCHP.

## 2017-01-31 NOTE — Progress Notes (Signed)
[facility] Follow up:    Department, [facility] [place] [address]   DIAGNOSIS: Cancer Staging Malignant neoplasm of upper-outer quadrant of left breast in female, estrogen receptor positive ([place]) Staging form: Breast, AJCC 8th Edition - Clinical stage from 11/09/2016: Stage IIB (cT2, cN1, cM0, G3, ER: Positive, PR: Negative, HER2: Positive) - Unsigned   SUMMARY OF ONCOLOGIC HISTORY:   Malignant neoplasm of upper-outer quadrant of left breast in female, estrogen receptor positive ([place])   11/01/2016 Initial Diagnosis    Palpable left breast mass with 4 enl axillary lymph nodes 4.8 x 0.8 x 3.1 cm; left axillary mass 1.1 x 0.5 cm, lymph node 1.4 x 0.8 cm biopsy grade 2-3 IDC ER 30% PR 0% HER-2 positive ratio 7.53, Ki-67 20%, T2 N2 MX stage IIIa clinical stage      12/02/2016 Genetic Testing    Negative genetic testing on the STAT panel.  The STAT Breast cancer panel offered by [facility] includes sequencing and rearrangement analysis for the following 9 genes:  ATM, BRCA1, BRCA2, CDH1, CHEK2, PALB2, PTEN, STK11 and TP53.   The report date is December 02, 2016.      12/14/2016 Surgery    Left modified radical mastectomy: IDC grade 3, spans 5 cm and a 2.2 cm with extensive DCIS grade 3, lymphovascular invasion present, margins negative, 17/17 lymph nodes positive, ER 30%, PR 0%, HER-2 positive, Ki-67 20%, T2 N3a M0 stage IIIB AJCC 8      01/10/2017 -  Chemotherapy    Adjuvant chemotherapy with TCH Perjeta 6 cycles followed by Herceptin Perjeta maintenance        CURRENT THERAPY: TCH Perjeta cycle 2 day 1  INTERVAL HISTORY: Diane Rosario 41 y.o. female returns for evaluation prior to receiving her chemotherapy today.  She is doing well today.  She says that she tolerated her first cycle of chemotherapy well.  She is starting to lose her hair.  She denies any other issues today.    Patient Active Problem List   Diagnosis Date Noted  .  Port-A-Cath in place 01/31/2017  . Cancer of overlapping sites of left female breast ([place]) 12/14/2016  . Genetic testing 12/07/2016  . Malignant neoplasm of upper-outer quadrant of left breast in female, estrogen receptor positive ([place]) 11/03/2016  . Posttraumatic stress disorder 09/06/2012  . Major depressive disorder, recurrent episode, severe, specified as with psychotic behavior 09/06/2012  . Cannabis abuse 09/06/2012  . Cocaine abuse ([place]) 09/06/2012    is allergic to asa [aspirin].  MEDICAL HISTORY: Past Medical History:  Diagnosis Date  . Anxiety   . Bipolar 1 disorder ([place])    pt reports bipolar, pt reports Auditory hallucinations  . COPD (chronic obstructive pulmonary disease) ([place])   . Depression   . Malignant neoplasm of upper-outer quadrant of left breast in female, estrogen receptor positive ([place]) 11/03/2016  . PTSD (post-traumatic stress disorder)    husband was murdered    SURGICAL HISTORY: Past Surgical History:  Procedure Laterality Date  . EYE SURGERY     both eyes ; correct weak eye muscles   . MASTECTOMY MODIFIED RADICAL Left 12/14/2016   Procedure: LEFT MODIFIED RADICAL MASTECTOMY WITH ERAS PATHWAY;  Surgeon: [name], MD;  Location: [facility];  Service: General;  Laterality: Left;  . PORTACATH PLACEMENT Right 12/14/2016   Procedure: INSERTION PORT-A-CATH WITH ULTRASOUND;  Surgeon: [name], MD;  Location: [facility];  Service: General;  Laterality: Right;    SOCIAL  HISTORY: Social History   Social History  . Marital status: Widowed    Spouse name: N/A  . Number of children: N/A  . Years of education: N/A   Occupational History  . Not on file.   Social History Main Topics  . Smoking status: Current Every Day Smoker    Packs/day: 0.25    Years: 10.00    Types: Cigarettes  . Smokeless tobacco: Never Used  . Alcohol use Yes     Comment: occasionally; 1 per week   . Drug use: Yes    Types: Marijuana, Cocaine     Comment: 1 month since use  of marijuana ; uses cocaine at least 1-2 times per weekl ; say s trying to wean off  . Sexual activity: Yes   Other Topics Concern  . Not on file   Social History Narrative  . No narrative on file    FAMILY HISTORY: Family History  Problem Relation Age of Onset  . Hypertension Maternal Aunt   . Hypertension Maternal Grandmother   . Cirrhosis Father   . Cancer Father        unknown type    Review of Systems  Constitutional: Negative for appetite change, chills, fatigue, fever and unexpected weight change.  HENT:   Negative for hearing loss and lump/mass.   Eyes: Negative for eye problems and icterus.  Respiratory: Negative for chest tightness, cough and shortness of breath.   Cardiovascular: Negative for chest pain, leg swelling and palpitations.  Gastrointestinal: Negative for abdominal distention, abdominal pain, constipation, diarrhea, nausea and vomiting.  Endocrine: Negative for hot flashes.  Musculoskeletal: Negative for arthralgias.  Skin: Negative for itching and rash.  Neurological: Negative for dizziness, extremity weakness, headaches, light-headedness and numbness.  Hematological: Negative for adenopathy. Does not bruise/bleed easily.  Psychiatric/Behavioral: Negative for depression. The patient is not nervous/anxious.       PHYSICAL EXAMINATION  ECOG PERFORMANCE STATUS: 1 - Symptomatic but completely ambulatory  Vitals:   01/31/17 1100  BP: (!) 85/55  Pulse: 88  Resp: 18  Temp: 98.1 F (36.7 C)  SpO2: 100%  BP recheck 88/52 manually  Physical Exam  Constitutional: She is oriented to person, place, and time and well-developed, well-nourished, and in no distress.  Appears cachectic   HENT:  Head: Normocephalic and atraumatic.  Mouth/Throat: Oropharynx is clear and moist. No oropharyngeal exudate.  Eyes: Pupils are equal, round, and reactive to light. No scleral icterus.  Neck: Neck supple.  Cardiovascular: Normal rate, regular rhythm and normal  heart sounds.  Exam reveals no gallop and no friction rub.   No murmur heard. Pulmonary/Chest: Effort normal and breath sounds normal. No respiratory distress. She has no wheezes. She has no rales.  Abdominal: Soft. Bowel sounds are normal. She exhibits no distension and no mass. There is no tenderness. There is no rebound and no guarding.  Musculoskeletal: She exhibits no edema.  Lymphadenopathy:    She has no cervical adenopathy.  Neurological: She is alert and oriented to person, place, and time.  Skin: Skin is warm and dry.  Psychiatric: Mood and affect normal.    LABORATORY DATA:  CBC    Component Value Date/Time   WBC 5.5 01/31/2017 1028   WBC 8.8 12/15/2016 0523   RBC 3.44 (L) 01/31/2017 1028   RBC 3.34 (L) 12/15/2016 0523   HGB 11.1 (L) 01/31/2017 1028   HCT 33.8 (L) 01/31/2017 1028   PLT 178 01/31/2017 1028   MCV 98.3 01/31/2017 1028  MCH 32.3 01/31/2017 1028   MCH 32.0 12/15/2016 0523   MCHC 32.8 01/31/2017 1028   MCHC 33.1 12/15/2016 0523   RDW 12.2 01/31/2017 1028   LYMPHSABS 3.3 01/31/2017 1028   MONOABS 0.4 01/31/2017 1028   EOSABS 0.0 01/31/2017 1028   BASOSABS 0.0 01/31/2017 1028    CMP     Component Value Date/Time   NA 140 01/31/2017 1028   K 3.3 (L) 01/31/2017 1028   CL 104 12/15/2016 0532   CO2 28 01/31/2017 1028   GLUCOSE 99 01/31/2017 1028   BUN 15.2 01/31/2017 1028   CREATININE 0.9 01/31/2017 1028   CALCIUM 8.8 01/31/2017 1028   PROT 6.5 01/31/2017 1028   ALBUMIN 3.4 (L) 01/31/2017 1028   AST 21 01/31/2017 1028   ALT 14 01/31/2017 1028   ALKPHOS 65 01/31/2017 1028   BILITOT <0.22 01/31/2017 1028   GFRNONAA >60 12/15/2016 0532   GFRAA >60 12/15/2016 0532      ASSESSMENT and PLAN:   Malignant neoplasm of upper-outer quadrant of left breast in female, estrogen receptor positive ([place]) 12/14/2016: Left modified radical mastectomy: IDC grade 3, spans 5 cm and a 2.2 cm with extensive DCIS grade 3, lymphovascular invasion present, margins  negative, 17/17 lymph nodes positive, ER 30%, PR 0%, HER-2 positive, Ki-67 20%, T2 N3a M0 stage IIIB AJCC 8  Patient has long-standing issues with bipolar disorder and substance abuse previously. CT CAP and Bone Scan 11/17/2016: No metastases 11 mm left lower lobe groundglass nodule probably infectious etiology   Treatment plan:  1. Adjuvant chemotherapy with [place] Perjeta every 3 weeks 6 followed by Herceptin Perjeta maintenance for 1 year 3. Followed by adjuvant radiation 4. Followed by adjuvant antiestrogen therapy 5-7 years ------------------------------------------------------------------------- Current treatment: Cycle 2 day 1 TCH Perjeta Echocardiogram: Ejection fraction 50% on 12/06/2016, f/u with Dr. Haroldine Laws in 02/2017  Chemotherapy toxicities:  She is beginning to lose her hair which is normal for this time period.    Christne is slightly hypotensive today.  She tells me that is normal for her bp to be in the 64Q or 03K systolic, and she is asymptomatic.  I did order for some fluids to run in during the down time when she is receiving her treatments.  She will proceed with her treatment today.  Aliah will return in three weeks for labs, f/u with Dr. Lindi Adie, and cycle 3 of TCHP.     All questions were answered. The patient knows to call the clinic with any problems, questions or concerns. We can certainly see the patient much sooner if necessary.  A total of (30) minutes of face-to-face time was spent with this patient with greater than 50% of that time in counseling and care-coordination.  This note was electronically signed. Scot Dock, NP 01/31/2017

## 2017-02-21 ENCOUNTER — Other Ambulatory Visit (HOSPITAL_BASED_OUTPATIENT_CLINIC_OR_DEPARTMENT_OTHER): Payer: Medicaid Other

## 2017-02-21 ENCOUNTER — Telehealth: Payer: Self-pay

## 2017-02-21 ENCOUNTER — Ambulatory Visit: Payer: Medicaid Other

## 2017-02-21 ENCOUNTER — Ambulatory Visit (HOSPITAL_BASED_OUTPATIENT_CLINIC_OR_DEPARTMENT_OTHER): Payer: Medicaid Other

## 2017-02-21 ENCOUNTER — Ambulatory Visit (HOSPITAL_BASED_OUTPATIENT_CLINIC_OR_DEPARTMENT_OTHER): Payer: Medicaid Other | Admitting: Hematology and Oncology

## 2017-02-21 DIAGNOSIS — Z5112 Encounter for antineoplastic immunotherapy: Secondary | ICD-10-CM | POA: Diagnosis not present

## 2017-02-21 DIAGNOSIS — C773 Secondary and unspecified malignant neoplasm of axilla and upper limb lymph nodes: Secondary | ICD-10-CM | POA: Diagnosis not present

## 2017-02-21 DIAGNOSIS — R531 Weakness: Secondary | ICD-10-CM | POA: Diagnosis not present

## 2017-02-21 DIAGNOSIS — Z170 Estrogen receptor positive status [ER+]: Secondary | ICD-10-CM | POA: Diagnosis not present

## 2017-02-21 DIAGNOSIS — Z5111 Encounter for antineoplastic chemotherapy: Secondary | ICD-10-CM | POA: Diagnosis not present

## 2017-02-21 DIAGNOSIS — C50412 Malignant neoplasm of upper-outer quadrant of left female breast: Secondary | ICD-10-CM

## 2017-02-21 DIAGNOSIS — Z95828 Presence of other vascular implants and grafts: Secondary | ICD-10-CM

## 2017-02-21 DIAGNOSIS — C50812 Malignant neoplasm of overlapping sites of left female breast: Secondary | ICD-10-CM

## 2017-02-21 DIAGNOSIS — R5381 Other malaise: Secondary | ICD-10-CM | POA: Diagnosis not present

## 2017-02-21 LAB — COMPREHENSIVE METABOLIC PANEL
ALT: 16 U/L (ref 0–55)
AST: 24 U/L (ref 5–34)
Albumin: 3.4 g/dL — ABNORMAL LOW (ref 3.5–5.0)
Alkaline Phosphatase: 59 U/L (ref 40–150)
Anion Gap: 6 mEq/L (ref 3–11)
BUN: 13.8 mg/dL (ref 7.0–26.0)
CO2: 27 mEq/L (ref 22–29)
Calcium: 8.8 mg/dL (ref 8.4–10.4)
Chloride: 106 mEq/L (ref 98–109)
Creatinine: 0.8 mg/dL (ref 0.6–1.1)
EGFR: >60 mL/min/{1.73_m2} (ref >60)
Glucose: 91 mg/dl (ref 70–140)
Potassium: 3.8 mEq/L (ref 3.5–5.1)
Sodium: 139 mEq/L (ref 136–145)
Total Bilirubin: <0.22 mg/dL (ref 0.20–1.20)
Total Protein: 6.9 g/dL (ref 6.4–8.3)

## 2017-02-21 LAB — CBC WITH DIFFERENTIAL/PLATELET
BASO%: 1.2 % (ref 0.0–2.0)
Basophils Absolute: 0.1 10*3/uL (ref 0.0–0.1)
EOS%: 0.2 % (ref 0.0–7.0)
Eosinophils Absolute: 0 10*3/uL (ref 0.0–0.5)
HCT: 33.7 % — ABNORMAL LOW (ref 34.8–46.6)
HGB: 11.2 g/dL — ABNORMAL LOW (ref 11.6–15.9)
LYMPH%: 39.6 % (ref 14.0–49.7)
MCH: 32.5 pg (ref 25.1–34.0)
MCHC: 33.2 g/dL (ref 31.5–36.0)
MCV: 98 fL (ref 79.5–101.0)
MONO#: 0.5 10*3/uL (ref 0.1–0.9)
MONO%: 8.1 % (ref 0.0–14.0)
NEUT#: 2.9 10*3/uL (ref 1.5–6.5)
NEUT%: 50.9 % (ref 38.4–76.8)
Platelets: 209 10*3/uL (ref 145–400)
RBC: 3.44 10*6/uL — ABNORMAL LOW (ref 3.70–5.45)
RDW: 13 % (ref 11.2–14.5)
WBC: 5.8 10*3/uL (ref 3.9–10.3)
lymph#: 2.3 10*3/uL (ref 0.9–3.3)

## 2017-02-21 MED ORDER — PALONOSETRON HCL INJECTION 0.25 MG/5ML
0.2500 mg | Freq: Once | INTRAVENOUS | Status: AC
  Administered 2017-02-21: 0.25 mg via INTRAVENOUS

## 2017-02-21 MED ORDER — SODIUM CHLORIDE 0.9 % IV SOLN
Freq: Once | INTRAVENOUS | Status: AC
  Administered 2017-02-21: 13:00:00 via INTRAVENOUS

## 2017-02-21 MED ORDER — DIPHENHYDRAMINE HCL 25 MG PO CAPS
ORAL_CAPSULE | ORAL | Status: AC
  Filled 2017-02-21: qty 2

## 2017-02-21 MED ORDER — DEXAMETHASONE SODIUM PHOSPHATE 10 MG/ML IJ SOLN
INTRAMUSCULAR | Status: AC
  Filled 2017-02-21: qty 1

## 2017-02-21 MED ORDER — SODIUM CHLORIDE 0.9% FLUSH
10.0000 mL | INTRAVENOUS | Status: DC | PRN
Start: 2017-02-21 — End: 2017-02-21
  Administered 2017-02-21: 10 mL via INTRAVENOUS
  Filled 2017-02-21: qty 10

## 2017-02-21 MED ORDER — TRASTUZUMAB CHEMO 150 MG IV SOLR
6.0000 mg/kg | Freq: Once | INTRAVENOUS | Status: AC
  Administered 2017-02-21: 252 mg via INTRAVENOUS
  Filled 2017-02-21: qty 12

## 2017-02-21 MED ORDER — DIPHENHYDRAMINE HCL 25 MG PO CAPS
50.0000 mg | ORAL_CAPSULE | Freq: Once | ORAL | Status: AC
  Administered 2017-02-21: 50 mg via ORAL

## 2017-02-21 MED ORDER — PEGFILGRASTIM 6 MG/0.6ML ~~LOC~~ PSKT
6.0000 mg | PREFILLED_SYRINGE | Freq: Once | SUBCUTANEOUS | Status: AC
  Administered 2017-02-21: 6 mg via SUBCUTANEOUS
  Filled 2017-02-21: qty 0.6

## 2017-02-21 MED ORDER — DEXAMETHASONE SODIUM PHOSPHATE 10 MG/ML IJ SOLN
10.0000 mg | Freq: Once | INTRAMUSCULAR | Status: AC
  Administered 2017-02-21: 10 mg via INTRAVENOUS

## 2017-02-21 MED ORDER — ACETAMINOPHEN 325 MG PO TABS
650.0000 mg | ORAL_TABLET | Freq: Once | ORAL | Status: AC
  Administered 2017-02-21: 650 mg via ORAL

## 2017-02-21 MED ORDER — SODIUM CHLORIDE 0.9% FLUSH
10.0000 mL | INTRAVENOUS | Status: DC | PRN
  Administered 2017-02-21: 10 mL
  Filled 2017-02-21: qty 10

## 2017-02-21 MED ORDER — SODIUM CHLORIDE 0.9 % IV SOLN
430.0000 mg | Freq: Once | INTRAVENOUS | Status: AC
  Administered 2017-02-21: 430 mg via INTRAVENOUS
  Filled 2017-02-21: qty 43

## 2017-02-21 MED ORDER — PALONOSETRON HCL INJECTION 0.25 MG/5ML
INTRAVENOUS | Status: AC
  Filled 2017-02-21: qty 5

## 2017-02-21 MED ORDER — ACETAMINOPHEN 325 MG PO TABS
ORAL_TABLET | ORAL | Status: AC
  Filled 2017-02-21: qty 2

## 2017-02-21 MED ORDER — PERTUZUMAB CHEMO INJECTION 420 MG/14ML
420.0000 mg | Freq: Once | INTRAVENOUS | Status: AC
  Administered 2017-02-21: 420 mg via INTRAVENOUS
  Filled 2017-02-21: qty 14

## 2017-02-21 MED ORDER — HEPARIN SOD (PORK) LOCK FLUSH 100 UNIT/ML IV SOLN
500.0000 [IU] | Freq: Once | INTRAVENOUS | Status: AC | PRN
  Administered 2017-02-21: 500 [IU]
  Filled 2017-02-21: qty 5

## 2017-02-21 MED ORDER — DOCETAXEL CHEMO INJECTION 160 MG/16ML
65.0000 mg/m2 | Freq: Once | INTRAVENOUS | Status: AC
  Administered 2017-02-21: 90 mg via INTRAVENOUS
  Filled 2017-02-21: qty 9

## 2017-02-21 NOTE — Progress Notes (Signed)
Patient Care Team: Department, [facility] as PCP - General [name], MD as Consulting Physician (General Surgery) Nicholas Lose, MD as Consulting Physician (Hematology and Oncology) Kyung Rudd, MD as Consulting Physician (Radiation Oncology)  DIAGNOSIS:  Encounter Diagnosis  Name Primary?  . Malignant neoplasm of upper-outer quadrant of left breast in female, estrogen receptor positive ([name])     SUMMARY OF ONCOLOGIC HISTORY:   Malignant neoplasm of upper-outer quadrant of left breast in female, estrogen receptor positive ([name])   11/01/2016 Initial Diagnosis    Palpable left breast mass with 4 enl axillary lymph nodes 4.8 x 0.8 x 3.1 cm; left axillary mass 1.1 x 0.5 cm, lymph node 1.4 x 0.8 cm biopsy grade 2-3 IDC ER 30% PR 0% HER-2 positive ratio 7.53, Ki-67 20%, T2 N2 MX stage IIIa clinical stage      12/02/2016 Genetic Testing    Negative genetic testing on the STAT panel.  The STAT Breast cancer panel offered by [facility] includes sequencing and rearrangement analysis for the following 9 genes:  ATM, BRCA1, BRCA2, CDH1, CHEK2, PALB2, PTEN, STK11 and TP53.   The report date is December 02, 2016.      12/14/2016 Surgery    Left modified radical mastectomy: IDC grade 3, spans 5 cm and a 2.2 cm with extensive DCIS grade 3, lymphovascular invasion present, margins negative, 17/17 lymph nodes positive, ER 30%, PR 0%, HER-2 positive, Ki-67 20%, T2 N3a M0 stage IIIB AJCC 8      01/10/2017 -  Chemotherapy    Adjuvant chemotherapy with TCH Perjeta 6 cycles followed by Herceptin Perjeta maintenance        CHIEF COMPLIANT: Cycle 3 TCH Perjeta  INTERVAL HISTORY: Diane Rosario is a 41 year old with above-mentioned history of left breast cancer who underwent mastectomy and is currently on adjuvant chemotherapy today cycle 3 of her treatment.  She tells me she does not feel too badly.  She denies any nausea vomiting.  She does have fatigue and generalized weakness.  REVIEW  OF SYSTEMS:   Constitutional: Denies fevers, chills or abnormal weight loss Eyes: Denies blurriness of vision Ears, nose, mouth, throat, and face: Denies mucositis or sore throat Respiratory: Denies cough, dyspnea or wheezes Cardiovascular: Denies palpitation, chest discomfort Gastrointestinal:  Denies nausea, heartburn or change in bowel habits Skin: Denies abnormal skin rashes Lymphatics: Denies new lymphadenopathy or easy bruising Neurological:Denies numbness, tingling or new weaknesses Behavioral/Psych: Mood is stable, no new changes  Extremities: No lower extremity edema Breast:  denies any pain or lumps or nodules in either breasts All other systems were reviewed with the patient and are negative.  I have reviewed the past medical history, past surgical history, social history and family history with the patient and they are unchanged from previous note.  ALLERGIES:  is allergic to asa [aspirin].  MEDICATIONS:  Current Outpatient Medications  Medication Sig Dispense Refill  . ALPRAZolam (XANAX) 0.5 MG tablet Take 0.5 mg by mouth 2 (two) times daily as needed for anxiety.     Marland Kitchen dexamethasone (DECADRON) 4 MG tablet Take 1 tablet (4 mg total) by mouth daily. Take 1 tablet day before chemotherapy and 1 tablet the day after chemotherapy 30 tablet 1  . ENSURE (ENSURE) Take 237 mLs by mouth daily.    . haloperidol (HALDOL) 10 MG tablet Take 10 mg by mouth at bedtime.   1  . HYDROcodone-acetaminophen (NORCO/VICODIN) 5-325 MG tablet Take 1-2 tablets by mouth every 4 (four) hours as needed for moderate  pain. 20 tablet 0  . lidocaine-prilocaine (EMLA) cream Apply to affected area once 30 g 3  . LORazepam (ATIVAN) 0.5 MG tablet Take 1 tablet (0.5 mg total) by mouth at bedtime. 30 tablet 0  . ondansetron (ZOFRAN) 8 MG tablet Take 1 tablet (8 mg total) by mouth 2 (two) times daily as needed for refractory nausea / vomiting. Start on day 3 after chemo. 30 tablet 1  . prochlorperazine (COMPAZINE)  10 MG tablet Take 1 tablet (10 mg total) by mouth every 6 (six) hours as needed (Nausea or vomiting). 30 tablet 1  . risperiDONE microspheres (RISPERDAL CONSTA) 50 MG injection Inject 50 mg into the muscle every 14 (fourteen) days.     No current facility-administered medications for this visit.    Facility-Administered Medications Ordered in Other Visits  Medication Dose Route Frequency Provider Last Rate Last Dose  . sodium chloride flush (NS) 0.9 % injection 10 mL  10 mL Intracatheter PRN Nicholas Lose, MD        PHYSICAL EXAMINATION: ECOG PERFORMANCE STATUS: 1 - Symptomatic but completely ambulatory  Vitals:   02/21/17 1141  BP: 92/66  Pulse: 88  Resp: 16  Temp: 97.8 F (36.6 C)  SpO2: 100%   Filed Weights   02/21/17 1141  Weight: 92 lb 12.8 oz (42.1 kg)    GENERAL:alert, no distress and comfortable SKIN: skin color, texture, turgor are normal, no rashes or significant lesions EYES: normal, Conjunctiva are pink and non-injected, sclera clear OROPHARYNX:no exudate, no erythema and lips, buccal mucosa, and tongue normal  NECK: supple, thyroid normal size, non-tender, without nodularity LYMPH:  no palpable lymphadenopathy in the cervical, axillary or inguinal LUNGS: clear to auscultation and percussion with normal breathing effort HEART: regular rate & rhythm and no murmurs and no lower extremity edema ABDOMEN:abdomen soft, non-tender and normal bowel sounds MUSCULOSKELETAL:no cyanosis of digits and no clubbing  NEURO: alert & oriented x 3 with fluent speech, no focal motor/sensory deficits EXTREMITIES: No lower extremity edema  LABORATORY DATA:  I have reviewed the data as listed   Chemistry      Component Value Date/Time   NA 140 01/31/2017 1028   K 3.3 (L) 01/31/2017 1028   CL 104 12/15/2016 0532   CO2 28 01/31/2017 1028   BUN 15.2 01/31/2017 1028   CREATININE 0.9 01/31/2017 1028      Component Value Date/Time   CALCIUM 8.8 01/31/2017 1028   ALKPHOS 65  01/31/2017 1028   AST 21 01/31/2017 1028   ALT 14 01/31/2017 1028   BILITOT <0.22 01/31/2017 1028       Lab Results  Component Value Date   WBC 5.8 02/21/2017   HGB 11.2 (L) 02/21/2017   HCT 33.7 (L) 02/21/2017   MCV 98.0 02/21/2017   PLT 209 02/21/2017   NEUTROABS 2.9 02/21/2017    ASSESSMENT & PLAN:  Malignant neoplasm of upper-outer quadrant of left breast in female, estrogen receptor positive ([place]) 12/14/2016: Left modified radical mastectomy: IDC grade 3, spans 5 cm and a 2.2 cm with extensive DCIS grade 3, lymphovascular invasion present, margins negative, 17/17 lymph nodes positive, ER 30%, PR 0%, HER-2 positive, Ki-67 20%, T2 N3a M0 stage IIIB AJCC 8  Patient has long-standing issues with bipolar disorder and substance abuse previously. CT CAP and Bone Scan 11/17/2016: No metastases 11 mm left lower lobe groundglass nodule probably infectious etiology   Treatment plan:  1. Adjuvant chemotherapy with [place] Perjeta every 3 weeks 6 followed by Herceptin Perjeta maintenance for  1 year 3. Followed by adjuvant radiation 4. Followed by adjuvant antiestrogen therapy 5-7 years ------------------------------------------------------------------------- Current treatment: Cycle 3 day 1 TCH Perjeta Echocardiogram: Ejection fraction 50% on 12/06/2016  Chemotherapy toxicities: 1. Chemotherapy-induced nausea for 2 days 2. Perjeta induced diarrhea: Improved  Return to clinic in 3 weeks for cycle 4   I spent 25 minutes talking to the patient of which more than half was spent in counseling and coordination of care.  No orders of the defined types were placed in this encounter.  The patient has a good understanding of the overall plan. she agrees with it. she will call with any problems that may develop before the next visit here.   Rulon Eisenmenger, MD 02/21/17

## 2017-02-21 NOTE — Telephone Encounter (Signed)
No los per 11/13 dar.

## 2017-02-21 NOTE — Assessment & Plan Note (Signed)
12/14/2016: Left modified radical mastectomy: IDC grade 3, spans 5 cm and a 2.2 cm with extensive DCIS grade 3, lymphovascular invasion present, margins negative, 17/17 lymph nodes positive, ER 30%, PR 0%, HER-2 positive, Ki-67 20%, T2 N3a M0 stage IIIB AJCC 8  Patient has long-standing issues with bipolar disorder and substance abuse previously. CT CAP and Bone Scan 11/17/2016: No metastases 11 mm left lower lobe groundglass nodule probably infectious etiology   Treatment plan:  1. Adjuvant chemotherapy with [place] Perjeta every 3 weeks 6 followed by Herceptin Perjeta maintenance for 1 year 3. Followed by adjuvant radiation 4. Followed by adjuvant antiestrogen therapy 5-7 years ------------------------------------------------------------------------- Current treatment: Cycle 3 day 1 TCH Perjeta Echocardiogram: Ejection fraction 50% on 12/06/2016  Chemotherapy toxicities: 1. Chemotherapy-induced nausea for 2 days 2. Perjeta induced diarrhea: Improved  Return to clinic in 3 weeks for cycle 4

## 2017-02-21 NOTE — Patient Instructions (Signed)
[facility] Discharge Instructions for Patients Receiving Chemotherapy  Today you received the following chemotherapy agents herceptin, perjeta, taxotere, and carboplatin  To help prevent nausea and vomiting after your treatment, we encourage you to take your nausea medication as directed   If you develop nausea and vomiting that is not controlled by your nausea medication, call the clinic.   BELOW ARE SYMPTOMS THAT SHOULD BE REPORTED IMMEDIATELY:  *FEVER GREATER THAN 100.5 F  *CHILLS WITH OR WITHOUT FEVER  NAUSEA AND VOMITING THAT IS NOT CONTROLLED WITH YOUR NAUSEA MEDICATION  *UNUSUAL SHORTNESS OF BREATH  *UNUSUAL BRUISING OR BLEEDING  TENDERNESS IN MOUTH AND THROAT WITH OR WITHOUT PRESENCE OF ULCERS  *URINARY PROBLEMS  *BOWEL PROBLEMS  UNUSUAL RASH Items with * indicate a potential emergency and should be followed up as soon as possible.  Feel free to call the clinic should you have any questions or concerns. The clinic phone number is [phone].  Please show the [place] at check-in to the Emergency Department and triage nurse.

## 2017-02-22 ENCOUNTER — Ambulatory Visit (HOSPITAL_COMMUNITY): Payer: Medicaid Other | Admitting: Internal Medicine

## 2017-02-22 ENCOUNTER — Ambulatory Visit (HOSPITAL_COMMUNITY): Payer: Medicaid Other

## 2017-02-27 ENCOUNTER — Telehealth (HOSPITAL_COMMUNITY): Payer: Self-pay | Admitting: Vascular Surgery

## 2017-02-27 NOTE — Telephone Encounter (Signed)
Left pt message to move NEW BRST APPT W/ ECHO 11/30, asked pt to call back to make these changes

## 2017-03-14 ENCOUNTER — Ambulatory Visit: Payer: Medicaid Other

## 2017-03-14 ENCOUNTER — Other Ambulatory Visit: Payer: Medicaid Other

## 2017-03-14 ENCOUNTER — Ambulatory Visit: Payer: Medicaid Other | Admitting: Hematology and Oncology

## 2017-03-14 NOTE — Assessment & Plan Note (Deleted)
12/14/2016: Left modified radical mastectomy: IDC grade 3, spans 5 cm and a 2.2 cm with extensive DCIS grade 3, lymphovascular invasion present, margins negative, 17/17 lymph nodes positive, ER 30%, PR 0%, HER-2 positive, Ki-67 20%, T2 N3a M0 stage IIIB AJCC 8  Patient has long-standing issues with bipolar disorder and substance abuse previously. CT CAP and Bone Scan 11/17/2016: No metastases 11 mm left lower lobe groundglass nodule probably infectious etiology   Treatment plan:  1. Adjuvant chemotherapy with [place] Perjeta every 3 weeks 6 followed by Herceptin Perjeta maintenance for 1 year 3. Followed by adjuvant radiation 4. Followed by adjuvant antiestrogen therapy 5-7 years ------------------------------------------------------------------------- Current treatment: Cycle 4 day 1 TCH Perjeta Echocardiogram: Ejection fraction 50% on 12/06/2016  Chemotherapy toxicities: 1. Chemotherapy-induced nausea for 2 days 2. Perjeta induced diarrhea: Improved  Return to clinic in 3 weeks for cycle 5

## 2017-03-15 ENCOUNTER — Telehealth: Payer: Self-pay | Admitting: Hematology and Oncology

## 2017-03-15 ENCOUNTER — Other Ambulatory Visit: Payer: Medicaid Other

## 2017-03-15 NOTE — Telephone Encounter (Signed)
Spoke with patient regarding appt that was added per 12/4 sch msg.

## 2017-03-20 ENCOUNTER — Ambulatory Visit: Payer: Medicaid Other | Admitting: Hematology and Oncology

## 2017-03-21 ENCOUNTER — Other Ambulatory Visit (HOSPITAL_BASED_OUTPATIENT_CLINIC_OR_DEPARTMENT_OTHER): Payer: Medicaid Other

## 2017-03-21 ENCOUNTER — Telehealth: Payer: Self-pay | Admitting: *Deleted

## 2017-03-21 ENCOUNTER — Ambulatory Visit: Payer: Medicaid Other

## 2017-03-21 ENCOUNTER — Ambulatory Visit (HOSPITAL_BASED_OUTPATIENT_CLINIC_OR_DEPARTMENT_OTHER): Payer: Medicaid Other

## 2017-03-21 ENCOUNTER — Ambulatory Visit (HOSPITAL_BASED_OUTPATIENT_CLINIC_OR_DEPARTMENT_OTHER): Payer: Medicaid Other | Admitting: Hematology and Oncology

## 2017-03-21 DIAGNOSIS — Z170 Estrogen receptor positive status [ER+]: Principal | ICD-10-CM

## 2017-03-21 DIAGNOSIS — C773 Secondary and unspecified malignant neoplasm of axilla and upper limb lymph nodes: Secondary | ICD-10-CM | POA: Diagnosis not present

## 2017-03-21 DIAGNOSIS — Z5111 Encounter for antineoplastic chemotherapy: Secondary | ICD-10-CM | POA: Diagnosis not present

## 2017-03-21 DIAGNOSIS — D6481 Anemia due to antineoplastic chemotherapy: Secondary | ICD-10-CM | POA: Diagnosis not present

## 2017-03-21 DIAGNOSIS — C50412 Malignant neoplasm of upper-outer quadrant of left female breast: Secondary | ICD-10-CM

## 2017-03-21 DIAGNOSIS — Z5112 Encounter for antineoplastic immunotherapy: Secondary | ICD-10-CM

## 2017-03-21 DIAGNOSIS — C50812 Malignant neoplasm of overlapping sites of left female breast: Secondary | ICD-10-CM

## 2017-03-21 DIAGNOSIS — R911 Solitary pulmonary nodule: Secondary | ICD-10-CM

## 2017-03-21 DIAGNOSIS — Z95828 Presence of other vascular implants and grafts: Secondary | ICD-10-CM

## 2017-03-21 LAB — CBC WITH DIFFERENTIAL/PLATELET
BASO%: 0.2 % (ref 0.0–2.0)
Basophils Absolute: 0 10*3/uL (ref 0.0–0.1)
EOS%: 1.4 % (ref 0.0–7.0)
Eosinophils Absolute: 0.1 10*3/uL (ref 0.0–0.5)
HCT: 31.9 % — ABNORMAL LOW (ref 34.8–46.6)
HGB: 10.2 g/dL — ABNORMAL LOW (ref 11.6–15.9)
LYMPH%: 46.2 % (ref 14.0–49.7)
MCH: 32.5 pg (ref 25.1–34.0)
MCHC: 32 g/dL (ref 31.5–36.0)
MCV: 101.6 fL — ABNORMAL HIGH (ref 79.5–101.0)
MONO#: 0.6 10*3/uL (ref 0.1–0.9)
MONO%: 11.9 % (ref 0.0–14.0)
NEUT#: 2.1 10*3/uL (ref 1.5–6.5)
NEUT%: 40.3 % (ref 38.4–76.8)
Platelets: 324 10*3/uL (ref 145–400)
RBC: 3.14 10*6/uL — ABNORMAL LOW (ref 3.70–5.45)
RDW: 15.8 % — ABNORMAL HIGH (ref 11.2–14.5)
WBC: 5.1 10*3/uL (ref 3.9–10.3)
lymph#: 2.4 10*3/uL (ref 0.9–3.3)

## 2017-03-21 LAB — COMPREHENSIVE METABOLIC PANEL
ALT: 25 U/L (ref 0–55)
AST: 41 U/L — ABNORMAL HIGH (ref 5–34)
Albumin: 3.2 g/dL — ABNORMAL LOW (ref 3.5–5.0)
Alkaline Phosphatase: 56 U/L (ref 40–150)
Anion Gap: 8 mEq/L (ref 3–11)
BUN: 14.7 mg/dL (ref 7.0–26.0)
CO2: 26 mEq/L (ref 22–29)
Calcium: 8.7 mg/dL (ref 8.4–10.4)
Chloride: 106 mEq/L (ref 98–109)
Creatinine: 0.9 mg/dL (ref 0.6–1.1)
EGFR: >60 mL/min/{1.73_m2} (ref >60)
Glucose: 77 mg/dl (ref 70–140)
Potassium: 3.8 mEq/L (ref 3.5–5.1)
Sodium: 140 mEq/L (ref 136–145)
Total Bilirubin: 0.25 mg/dL (ref 0.20–1.20)
Total Protein: 6.3 g/dL — ABNORMAL LOW (ref 6.4–8.3)

## 2017-03-21 MED ORDER — SODIUM CHLORIDE 0.9% FLUSH
10.0000 mL | INTRAVENOUS | Status: DC | PRN
  Administered 2017-03-21: 10 mL via INTRAVENOUS
  Filled 2017-03-21: qty 10

## 2017-03-21 MED ORDER — SODIUM CHLORIDE 0.9 % IV SOLN
Freq: Once | INTRAVENOUS | Status: AC
  Administered 2017-03-21: 13:00:00 via INTRAVENOUS

## 2017-03-21 MED ORDER — PALONOSETRON HCL INJECTION 0.25 MG/5ML
INTRAVENOUS | Status: AC
  Filled 2017-03-21: qty 5

## 2017-03-21 MED ORDER — ACETAMINOPHEN 325 MG PO TABS
650.0000 mg | ORAL_TABLET | Freq: Once | ORAL | Status: AC
  Administered 2017-03-21: 650 mg via ORAL

## 2017-03-21 MED ORDER — DEXAMETHASONE SODIUM PHOSPHATE 10 MG/ML IJ SOLN
INTRAMUSCULAR | Status: AC
  Filled 2017-03-21: qty 1

## 2017-03-21 MED ORDER — PERTUZUMAB CHEMO INJECTION 420 MG/14ML
420.0000 mg | Freq: Once | INTRAVENOUS | Status: AC
  Administered 2017-03-21: 420 mg via INTRAVENOUS
  Filled 2017-03-21: qty 14

## 2017-03-21 MED ORDER — PALONOSETRON HCL INJECTION 0.25 MG/5ML
0.2500 mg | Freq: Once | INTRAVENOUS | Status: AC
  Administered 2017-03-21: 0.25 mg via INTRAVENOUS

## 2017-03-21 MED ORDER — PEGFILGRASTIM 6 MG/0.6ML ~~LOC~~ PSKT
PREFILLED_SYRINGE | SUBCUTANEOUS | Status: AC
  Filled 2017-03-21: qty 0.6

## 2017-03-21 MED ORDER — ALTEPLASE 2 MG IJ SOLR
2.0000 mg | Freq: Once | INTRAMUSCULAR | Status: DC | PRN
  Filled 2017-03-21: qty 2

## 2017-03-21 MED ORDER — SODIUM CHLORIDE 0.9 % IV SOLN
65.0000 mg/m2 | Freq: Once | INTRAVENOUS | Status: AC
  Administered 2017-03-21: 90 mg via INTRAVENOUS
  Filled 2017-03-21: qty 9

## 2017-03-21 MED ORDER — DEXAMETHASONE SODIUM PHOSPHATE 10 MG/ML IJ SOLN
10.0000 mg | Freq: Once | INTRAMUSCULAR | Status: AC
  Administered 2017-03-21: 10 mg via INTRAVENOUS

## 2017-03-21 MED ORDER — SODIUM CHLORIDE 0.9% FLUSH
10.0000 mL | INTRAVENOUS | Status: DC | PRN
  Administered 2017-03-21: 10 mL
  Filled 2017-03-21: qty 10

## 2017-03-21 MED ORDER — DIPHENHYDRAMINE HCL 25 MG PO CAPS
ORAL_CAPSULE | ORAL | Status: AC
  Filled 2017-03-21: qty 2

## 2017-03-21 MED ORDER — ALTEPLASE 2 MG IJ SOLR
INTRAMUSCULAR | Status: AC
  Filled 2017-03-21: qty 2

## 2017-03-21 MED ORDER — SODIUM CHLORIDE 0.9 % IV SOLN
430.0000 mg | Freq: Once | INTRAVENOUS | Status: AC
  Administered 2017-03-21: 430 mg via INTRAVENOUS
  Filled 2017-03-21: qty 43

## 2017-03-21 MED ORDER — ACETAMINOPHEN 325 MG PO TABS
ORAL_TABLET | ORAL | Status: AC
  Filled 2017-03-21: qty 2

## 2017-03-21 MED ORDER — PEGFILGRASTIM 6 MG/0.6ML ~~LOC~~ PSKT
6.0000 mg | PREFILLED_SYRINGE | Freq: Once | SUBCUTANEOUS | Status: AC
  Administered 2017-03-21: 6 mg via SUBCUTANEOUS

## 2017-03-21 MED ORDER — DIPHENHYDRAMINE HCL 25 MG PO CAPS
50.0000 mg | ORAL_CAPSULE | Freq: Once | ORAL | Status: AC
  Administered 2017-03-21: 50 mg via ORAL

## 2017-03-21 MED ORDER — TRASTUZUMAB CHEMO 150 MG IV SOLR
6.0000 mg/kg | Freq: Once | INTRAVENOUS | Status: AC
  Administered 2017-03-21: 252 mg via INTRAVENOUS
  Filled 2017-03-21: qty 12

## 2017-03-21 MED ORDER — HEPARIN SOD (PORK) LOCK FLUSH 100 UNIT/ML IV SOLN
500.0000 [IU] | Freq: Once | INTRAVENOUS | Status: AC | PRN
  Administered 2017-03-21: 500 [IU]
  Filled 2017-03-21: qty 5

## 2017-03-21 NOTE — Assessment & Plan Note (Signed)
12/14/2016: Left modified radical mastectomy: IDC grade 3, spans 5 cm and a 2.2 cm with extensive DCIS grade 3, lymphovascular invasion present, margins negative, 17/17 lymph nodes positive, ER 30%, PR 0%, HER-2 positive, Ki-67 20%, T2 N3a M0 stage IIIB AJCC 8  Patient has long-standing issues with bipolar disorder and substance abuse previously. CT CAP and Bone Scan 11/17/2016: No metastases 11 mm left lower lobe groundglass nodule probably infectious etiology   Treatment plan:  1. Adjuvant chemotherapy with TCH Perjeta every 3 weeks 6 followed by Herceptin Perjeta maintenance for1 year 3. Followed by adjuvant radiation 4. Followed by adjuvant antiestrogen therapy 5-7 years ------------------------------------------------------------------------- Current treatment: Cycle 4 day1TCH Perjeta Echocardiogram: Ejection fraction 50% on 12/06/2016  Chemotherapy toxicities: 1. Chemotherapy-induced nausea for 2 days 2. Perjeta induced diarrhea: Improved  Return to clinic in 3 weeks for cycle 5

## 2017-03-21 NOTE — Telephone Encounter (Signed)
"  Could someone call to let us know about tomorrow's appointment.  Trying to arrange transportation."

## 2017-03-21 NOTE — Patient Instructions (Signed)
[facility] Discharge Instructions for Patients Receiving Chemotherapy  Today you received the following chemotherapy agents:  Herceptin, Perjeta, Taxotere, and Carboplatin.  To help prevent nausea and vomiting after your treatment, we encourage you to take your nausea medication as directed.   If you develop nausea and vomiting that is not controlled by your nausea medication, call the clinic.   BELOW ARE SYMPTOMS THAT SHOULD BE REPORTED IMMEDIATELY:  *FEVER GREATER THAN 100.5 F  *CHILLS WITH OR WITHOUT FEVER  NAUSEA AND VOMITING THAT IS NOT CONTROLLED WITH YOUR NAUSEA MEDICATION  *UNUSUAL SHORTNESS OF BREATH  *UNUSUAL BRUISING OR BLEEDING  TENDERNESS IN MOUTH AND THROAT WITH OR WITHOUT PRESENCE OF ULCERS  *URINARY PROBLEMS  *BOWEL PROBLEMS  UNUSUAL RASH Items with * indicate a potential emergency and should be followed up as soon as possible.  Feel free to call the clinic should you have any questions or concerns. The clinic phone number is [phone].  Please show the CHEMO ALERT CARD at check-in to the Emergency Department and triage nurse.   

## 2017-03-21 NOTE — Progress Notes (Signed)
Patient Care Team: Department, [facility] as PCP - General [name], MD as Consulting Physician (General Surgery) Nicholas Lose, MD as Consulting Physician (Hematology and Oncology) Kyung Rudd, MD as Consulting Physician (Radiation Oncology)  DIAGNOSIS:  Encounter Diagnosis  Name Primary?  . Malignant neoplasm of upper-outer quadrant of left breast in female, estrogen receptor positive ([place])     SUMMARY OF ONCOLOGIC HISTORY:   Malignant neoplasm of upper-outer quadrant of left breast in female, estrogen receptor positive ([place])   11/01/2016 Initial Diagnosis    Palpable left breast mass with 4 enl axillary lymph nodes 4.8 x 0.8 x 3.1 cm; left axillary mass 1.1 x 0.5 cm, lymph node 1.4 x 0.8 cm biopsy grade 2-3 IDC ER 30% PR 0% HER-2 positive ratio 7.53, Ki-67 20%, T2 N2 MX stage IIIa clinical stage      12/02/2016 Genetic Testing    Negative genetic testing on the STAT panel.  The STAT Breast cancer panel offered by [facility] includes sequencing and rearrangement analysis for the following 9 genes:  ATM, BRCA1, BRCA2, CDH1, CHEK2, PALB2, PTEN, STK11 and TP53.   The report date is December 02, 2016.      12/14/2016 Surgery    Left modified radical mastectomy: IDC grade 3, spans 5 cm and a 2.2 cm with extensive DCIS grade 3, lymphovascular invasion present, margins negative, 17/17 lymph nodes positive, ER 30%, PR 0%, HER-2 positive, Ki-67 20%, T2 N3a M0 stage IIIB AJCC 8      01/10/2017 -  Chemotherapy    Adjuvant chemotherapy with TCH Perjeta 6 cycles followed by Herceptin Perjeta maintenance        CHIEF COMPLIANT: Cycle 4 TCH Perjeta  INTERVAL HISTORY: Diane Rosario is a 41 year old with above-mentioned history of left breast cancer treated with mastectomy and is currently on adjuvant chemotherapy and today's cycle 4 of [place].  Last week she did not receive the treatment because she had cold and cough and did not want to come for treatment.  She has not had  any major problems from chemo toxicities.  She denies any nausea vomiting.  She does not have diarrhea either.  REVIEW OF SYSTEMS:   Constitutional: Denies fevers, chills or abnormal weight loss Eyes: Denies blurriness of vision Ears, nose, mouth, throat, and face: Denies mucositis or sore throat Respiratory: Denies cough, dyspnea or wheezes Cardiovascular: Denies palpitation, chest discomfort Gastrointestinal:  Denies nausea, heartburn or change in bowel habits Skin: Denies abnormal skin rashes Lymphatics: Denies new lymphadenopathy or easy bruising Neurological:Denies numbness, tingling or new weaknesses Behavioral/Psych: Mood is stable, no new changes  Extremities: No lower extremity edema  All other systems were reviewed with the patient and are negative.  I have reviewed the past medical history, past surgical history, social history and family history with the patient and they are unchanged from previous note.  ALLERGIES:  is allergic to asa [aspirin].  MEDICATIONS:  Current Outpatient Medications  Medication Sig Dispense Refill  . ALPRAZolam (XANAX) 0.5 MG tablet Take 0.5 mg by mouth 2 (two) times daily as needed for anxiety.     Mar[place] Kitchen dexamethasone (DECADRON) 4 MG tablet Take 1 tablet (4 mg total) by mouth daily. Take 1 tablet day before chemotherapy and 1 tablet the day after chemotherapy 30 tablet 1  . ENSURE (ENSURE) Take 237 mLs by mouth daily.    . haloperidol (HALDOL) 10 MG tablet Take 10 mg by mouth at bedtime.   1  . HYDROcodone-acetaminophen (NORCO/VICODIN) 5-325 MG tablet  Take 1-2 tablets by mouth every 4 (four) hours as needed for moderate pain. 20 tablet 0  . lidocaine-prilocaine (EMLA) cream Apply to affected area once 30 g 3  . LORazepam (ATIVAN) 0.5 MG tablet Take 1 tablet (0.5 mg total) by mouth at bedtime. 30 tablet 0  . ondansetron (ZOFRAN) 8 MG tablet Take 1 tablet (8 mg total) by mouth 2 (two) times daily as needed for refractory nausea / vomiting. Start on day  3 after chemo. 30 tablet 1  . prochlorperazine (COMPAZINE) 10 MG tablet Take 1 tablet (10 mg total) by mouth every 6 (six) hours as needed (Nausea or vomiting). 30 tablet 1  . risperiDONE microspheres (RISPERDAL CONSTA) 50 MG injection Inject 50 mg into the muscle every 14 (fourteen) days.     No current facility-administered medications for this visit.    Facility-Administered Medications Ordered in Other Visits  Medication Dose Route Frequency Provider Last Rate Last Dose  . sodium chloride flush (NS) 0.9 % injection 10 mL  10 mL Intracatheter PRN Nicholas Lose, MD        PHYSICAL EXAMINATION: ECOG PERFORMANCE STATUS: 1 - Symptomatic but completely ambulatory  Vitals:   03/21/17 1219  BP: 96/61  Pulse: 83  Resp: 20  Temp: 98.3 F (36.8 C)  SpO2: 97%   Filed Weights   03/21/17 1219  Weight: 94 lb 1.6 oz (42.7 kg)    GENERAL:alert, no distress and comfortable SKIN: skin color, texture, turgor are normal, no rashes or significant lesions EYES: normal, Conjunctiva are pink and non-injected, sclera clear OROPHARYNX:no exudate, no erythema and lips, buccal mucosa, and tongue normal  NECK: supple, thyroid normal size, non-tender, without nodularity LYMPH:  no palpable lymphadenopathy in the cervical, axillary or inguinal LUNGS: clear to auscultation and percussion with normal breathing effort HEART: regular rate & rhythm and no murmurs and no lower extremity edema ABDOMEN:abdomen soft, non-tender and normal bowel sounds MUSCULOSKELETAL:no cyanosis of digits and no clubbing  NEURO: alert & oriented x 3 with fluent speech, no focal motor/sensory deficits EXTREMITIES: No lower extremity edema  LABORATORY DATA:  I have reviewed the data as listed   Chemistry      Component Value Date/Time   NA 140 03/21/2017 1106   K 3.8 03/21/2017 1106   CL 104 12/15/2016 0532   CO2 26 03/21/2017 1106   BUN 14.7 03/21/2017 1106   CREATININE 0.9 03/21/2017 1106      Component Value  Date/Time   CALCIUM 8.7 03/21/2017 1106   ALKPHOS 56 03/21/2017 1106   AST 41 (H) 03/21/2017 1106   ALT 25 03/21/2017 1106   BILITOT 0.25 03/21/2017 1106       Lab Results  Component Value Date   WBC 5.1 03/21/2017   HGB 10.2 (L) 03/21/2017   HCT 31.9 (L) 03/21/2017   MCV 101.6 (H) 03/21/2017   PLT 324 03/21/2017   NEUTROABS 2.1 03/21/2017    ASSESSMENT & PLAN:  Malignant neoplasm of upper-outer quadrant of left breast in female, estrogen receptor positive ([place]) 12/14/2016: Left modified radical mastectomy: IDC grade 3, spans 5 cm and a 2.2 cm with extensive DCIS grade 3, lymphovascular invasion present, margins negative, 17/17 lymph nodes positive, ER 30%, PR 0%, HER-2 positive, Ki-67 20%, T2 N3a M0 stage IIIB AJCC 8  Patient has long-standing issues with bipolar disorder and substance abuse previously. CT CAP and Bone Scan 11/17/2016: No metastases 11 mm left lower lobe groundglass nodule probably infectious etiology   Treatment plan:  1. Adjuvant  chemotherapy with [place] Perjeta every 3 weeks 6 followed by Herceptin Perjeta maintenance for1 year 3. Followed by adjuvant radiation 4. Followed by adjuvant antiestrogen therapy 5-7 years ------------------------------------------------------------------------- Current treatment: Cycle 4 day1TCH Perjeta Echocardiogram: Ejection fraction 50% on 12/06/2016  Chemotherapy toxicities: 1. Chemotherapy-induced nausea for 2 days 2. Perjeta induced diarrhea: Improved 3.  Chemotherapy-induced anemia  Recent upper respiratory infection: Resolved Return to clinic in 3 weeks for cycle 5   I spent 25 minutes talking to the patient of which more than half was spent in counseling and coordination of care.  No orders of the defined types were placed in this encounter.  The patient has a good understanding of the overall plan. she agrees with it. she will call with any problems that may develop before the next visit here.    Rulon Eisenmenger, MD 03/21/17

## 2017-03-21 NOTE — Progress Notes (Signed)
Port was flushed several times with a small amount of blood flow (1cc) from port. Micki Riley desk nurse (May) not available but Val Data processing manager) will administer CATHFLOW after blood draw from phlebotomist. Carlene Coria LPN

## 2017-03-23 ENCOUNTER — Ambulatory Visit (HOSPITAL_COMMUNITY): Payer: Medicaid Other | Admitting: Cardiology

## 2017-03-23 ENCOUNTER — Ambulatory Visit (HOSPITAL_COMMUNITY): Payer: Medicaid Other

## 2017-03-26 ENCOUNTER — Telehealth: Payer: Self-pay | Admitting: Hematology and Oncology

## 2017-03-26 NOTE — Telephone Encounter (Signed)
Scheduled appt per 12/11 los - had to move appt to 1/4 due to treatment availability. Spoke with patient regarding appts.

## 2017-04-05 ENCOUNTER — Other Ambulatory Visit: Payer: Medicaid Other

## 2017-04-05 ENCOUNTER — Ambulatory Visit: Payer: Medicaid Other

## 2017-04-05 ENCOUNTER — Ambulatory Visit: Payer: Medicaid Other | Admitting: Hematology and Oncology

## 2017-04-13 NOTE — Assessment & Plan Note (Signed)
12/14/2016: Left modified radical mastectomy: IDC grade 3, spans 5 cm and a 2.2 cm with extensive DCIS grade 3, lymphovascular invasion present, margins negative, 17/17 lymph nodes positive, ER 30%, PR 0%, HER-2 positive, Ki-67 20%, T2 N3a M0 stage IIIB AJCC 8  Patient has long-standing issues with bipolar disorder and substance abuse previously. CT CAP and Bone Scan 11/17/2016: No metastases 11 mm left lower lobe groundglass nodule probably infectious etiology   Treatment plan:  1. Adjuvant chemotherapy with TCH Perjeta every 3 weeks 6 followed by Herceptin Perjeta maintenance for1 year 3. Followed by adjuvant radiation 4. Followed by adjuvant antiestrogen therapy 5-7 years ------------------------------------------------------------------------- Current treatment: [place] Perjeta Echocardiogram: Ejection fraction 50% on 12/06/2016  Chemotherapy toxicities: 1. Chemotherapy-induced nausea for 2 days 2. Perjeta induced diarrhea:Improved 3.  Chemotherapy-induced anemia  Recent upper respiratory infection: Resolved Return to clinic in 3 weeks for cycle 6

## 2017-04-14 ENCOUNTER — Other Ambulatory Visit (HOSPITAL_BASED_OUTPATIENT_CLINIC_OR_DEPARTMENT_OTHER): Payer: Medicaid Other

## 2017-04-14 ENCOUNTER — Ambulatory Visit: Payer: Medicaid Other

## 2017-04-14 ENCOUNTER — Ambulatory Visit (HOSPITAL_BASED_OUTPATIENT_CLINIC_OR_DEPARTMENT_OTHER): Payer: Medicaid Other

## 2017-04-14 ENCOUNTER — Inpatient Hospital Stay: Payer: Medicaid Other | Attending: Hematology and Oncology | Admitting: Hematology and Oncology

## 2017-04-14 VITALS — BP 107/82 | HR 92 | Temp 98.9°F | Resp 18

## 2017-04-14 DIAGNOSIS — Z170 Estrogen receptor positive status [ER+]: Secondary | ICD-10-CM | POA: Diagnosis not present

## 2017-04-14 DIAGNOSIS — D6481 Anemia due to antineoplastic chemotherapy: Secondary | ICD-10-CM | POA: Diagnosis not present

## 2017-04-14 DIAGNOSIS — R530 Neoplastic (malignant) related fatigue: Secondary | ICD-10-CM

## 2017-04-14 DIAGNOSIS — C50412 Malignant neoplasm of upper-outer quadrant of left female breast: Secondary | ICD-10-CM | POA: Diagnosis present

## 2017-04-14 DIAGNOSIS — Z9012 Acquired absence of left breast and nipple: Secondary | ICD-10-CM | POA: Insufficient documentation

## 2017-04-14 DIAGNOSIS — Z95828 Presence of other vascular implants and grafts: Secondary | ICD-10-CM

## 2017-04-14 DIAGNOSIS — Z5111 Encounter for antineoplastic chemotherapy: Secondary | ICD-10-CM

## 2017-04-14 DIAGNOSIS — C773 Secondary and unspecified malignant neoplasm of axilla and upper limb lymph nodes: Secondary | ICD-10-CM | POA: Diagnosis not present

## 2017-04-14 DIAGNOSIS — C50812 Malignant neoplasm of overlapping sites of left female breast: Secondary | ICD-10-CM

## 2017-04-14 DIAGNOSIS — Z5112 Encounter for antineoplastic immunotherapy: Secondary | ICD-10-CM

## 2017-04-14 DIAGNOSIS — R110 Nausea: Secondary | ICD-10-CM

## 2017-04-14 LAB — CBC WITH DIFFERENTIAL/PLATELET
BASO%: 0.1 % (ref 0.0–2.0)
Basophils Absolute: 0 10*3/uL (ref 0.0–0.1)
EOS%: 0.4 % (ref 0.0–7.0)
Eosinophils Absolute: 0 10*3/uL (ref 0.0–0.5)
HCT: 31.9 % — ABNORMAL LOW (ref 34.8–46.6)
HGB: 10.4 g/dL — ABNORMAL LOW (ref 11.6–15.9)
LYMPH%: 28.4 % (ref 14.0–49.7)
MCH: 33.4 pg (ref 25.1–34.0)
MCHC: 32.6 g/dL (ref 31.5–36.0)
MCV: 102.6 fL — ABNORMAL HIGH (ref 79.5–101.0)
MONO#: 0.4 10*3/uL (ref 0.1–0.9)
MONO%: 5.3 % (ref 0.0–14.0)
NEUT#: 4.7 10*3/uL (ref 1.5–6.5)
NEUT%: 65.8 % (ref 38.4–76.8)
Platelets: 150 10*3/uL (ref 145–400)
RBC: 3.11 10*6/uL — ABNORMAL LOW (ref 3.70–5.45)
RDW: 15.6 % — ABNORMAL HIGH (ref 11.2–14.5)
WBC: 7.1 10*3/uL (ref 3.9–10.3)
lymph#: 2 10*3/uL (ref 0.9–3.3)

## 2017-04-14 LAB — COMPREHENSIVE METABOLIC PANEL
ALT: 12 U/L (ref 0–55)
AST: 21 U/L (ref 5–34)
Albumin: 3.5 g/dL (ref 3.5–5.0)
Alkaline Phosphatase: 74 U/L (ref 40–150)
Anion Gap: 8 mEq/L (ref 3–11)
BUN: 15.5 mg/dL (ref 7.0–26.0)
CO2: 28 mEq/L (ref 22–29)
Calcium: 9.4 mg/dL (ref 8.4–10.4)
Chloride: 100 mEq/L (ref 98–109)
Creatinine: 1 mg/dL (ref 0.6–1.1)
EGFR: >60 mL/min/{1.73_m2} (ref >60)
Glucose: 80 mg/dl (ref 70–140)
Potassium: 4.3 mEq/L (ref 3.5–5.1)
Sodium: 136 mEq/L (ref 136–145)
Total Bilirubin: 0.59 mg/dL (ref 0.20–1.20)
Total Protein: 7.5 g/dL (ref 6.4–8.3)

## 2017-04-14 MED ORDER — TRASTUZUMAB CHEMO 150 MG IV SOLR
6.0000 mg/kg | Freq: Once | INTRAVENOUS | Status: AC
  Administered 2017-04-14: 252 mg via INTRAVENOUS
  Filled 2017-04-14: qty 12

## 2017-04-14 MED ORDER — ACETAMINOPHEN 325 MG PO TABS
ORAL_TABLET | ORAL | Status: AC
  Filled 2017-04-14: qty 2

## 2017-04-14 MED ORDER — DEXAMETHASONE SODIUM PHOSPHATE 10 MG/ML IJ SOLN
INTRAMUSCULAR | Status: AC
  Filled 2017-04-14: qty 1

## 2017-04-14 MED ORDER — SODIUM CHLORIDE 0.9% FLUSH
10.0000 mL | INTRAVENOUS | Status: DC | PRN
  Administered 2017-04-14: 10 mL via INTRAVENOUS
  Filled 2017-04-14: qty 10

## 2017-04-14 MED ORDER — ACETAMINOPHEN 325 MG PO TABS
650.0000 mg | ORAL_TABLET | Freq: Once | ORAL | Status: AC
  Administered 2017-04-14: 650 mg via ORAL

## 2017-04-14 MED ORDER — DIPHENHYDRAMINE HCL 25 MG PO CAPS
50.0000 mg | ORAL_CAPSULE | Freq: Once | ORAL | Status: AC
  Administered 2017-04-14: 50 mg via ORAL

## 2017-04-14 MED ORDER — SODIUM CHLORIDE 0.9% FLUSH
10.0000 mL | INTRAVENOUS | Status: DC | PRN
  Administered 2017-04-14: 10 mL
  Filled 2017-04-14: qty 10

## 2017-04-14 MED ORDER — SODIUM CHLORIDE 0.9 % IV SOLN
Freq: Once | INTRAVENOUS | Status: AC
  Administered 2017-04-14: 14:00:00 via INTRAVENOUS

## 2017-04-14 MED ORDER — PALONOSETRON HCL INJECTION 0.25 MG/5ML
INTRAVENOUS | Status: AC
  Filled 2017-04-14: qty 5

## 2017-04-14 MED ORDER — SODIUM CHLORIDE 0.9 % IV SOLN
370.0000 mg | Freq: Once | INTRAVENOUS | Status: AC
  Administered 2017-04-14: 370 mg via INTRAVENOUS
  Filled 2017-04-14: qty 37

## 2017-04-14 MED ORDER — SODIUM CHLORIDE 0.9 % IV SOLN
420.0000 mg | Freq: Once | INTRAVENOUS | Status: AC
  Administered 2017-04-14: 420 mg via INTRAVENOUS
  Filled 2017-04-14: qty 14

## 2017-04-14 MED ORDER — SODIUM CHLORIDE 0.9 % IV SOLN
65.0000 mg/m2 | Freq: Once | INTRAVENOUS | Status: AC
  Administered 2017-04-14: 90 mg via INTRAVENOUS
  Filled 2017-04-14: qty 9

## 2017-04-14 MED ORDER — HEPARIN SOD (PORK) LOCK FLUSH 100 UNIT/ML IV SOLN
500.0000 [IU] | Freq: Once | INTRAVENOUS | Status: AC | PRN
  Administered 2017-04-14: 500 [IU]
  Filled 2017-04-14: qty 5

## 2017-04-14 MED ORDER — DIPHENHYDRAMINE HCL 25 MG PO CAPS
ORAL_CAPSULE | ORAL | Status: AC
  Filled 2017-04-14: qty 2

## 2017-04-14 MED ORDER — PALONOSETRON HCL INJECTION 0.25 MG/5ML
0.2500 mg | Freq: Once | INTRAVENOUS | Status: AC
  Administered 2017-04-14: 0.25 mg via INTRAVENOUS

## 2017-04-14 MED ORDER — DEXAMETHASONE SODIUM PHOSPHATE 10 MG/ML IJ SOLN
10.0000 mg | Freq: Once | INTRAMUSCULAR | Status: AC
  Administered 2017-04-14: 10 mg via INTRAVENOUS

## 2017-04-14 MED ORDER — PEGFILGRASTIM 6 MG/0.6ML ~~LOC~~ PSKT: 6.0000 mg | PREFILLED_SYRINGE | Freq: Once | SUBCUTANEOUS | Status: DC

## 2017-04-14 NOTE — Patient Instructions (Signed)
[facility] Discharge Instructions for Patients Receiving Chemotherapy  Today you received the following chemotherapy agents Herceptin,Perjeta, Taxotere and carboplatin  To help prevent nausea and vomiting after your treatment, we encourage you to take your nausea medication as directed If you develop nausea and vomiting that is not controlled by your nausea medication, call the clinic.   BELOW ARE SYMPTOMS THAT SHOULD BE REPORTED IMMEDIATELY:  *FEVER GREATER THAN 100.5 F  *CHILLS WITH OR WITHOUT FEVER  NAUSEA AND VOMITING THAT IS NOT CONTROLLED WITH YOUR NAUSEA MEDICATION  *UNUSUAL SHORTNESS OF BREATH  *UNUSUAL BRUISING OR BLEEDING  TENDERNESS IN MOUTH AND THROAT WITH OR WITHOUT PRESENCE OF ULCERS  *URINARY PROBLEMS  *BOWEL PROBLEMS  UNUSUAL RASH Items with * indicate a potential emergency and should be followed up as soon as possible.  Feel free to call the clinic should you have any questions or concerns. The clinic phone number is [phone].  Please show the [place] at check-in to the Emergency Department and triage nurse.

## 2017-04-14 NOTE — Progress Notes (Signed)
Patient Care Team: Department, [facility] as PCP - General Diane Skates, MD as Consulting Physician (General Surgery) Diane Lose, MD as Consulting Physician (Hematology and Oncology) Kyung Rudd, MD as Consulting Physician (Radiation Oncology)  DIAGNOSIS:  Encounter Diagnosis  Name Primary?  . Malignant neoplasm of upper-outer quadrant of left breast in female, estrogen receptor positive ([place])     SUMMARY OF ONCOLOGIC HISTORY:   Malignant neoplasm of upper-outer quadrant of left breast in female, estrogen receptor positive ([place])   11/01/2016 Initial Diagnosis    Palpable left breast mass with 4 enl axillary lymph nodes 4.8 x 0.8 x 3.1 cm; left axillary mass 1.1 x 0.5 cm, lymph node 1.4 x 0.8 cm biopsy grade 2-3 IDC ER 30% PR 0% HER-2 positive ratio 7.53, Ki-67 20%, T2 N2 MX stage IIIa clinical stage      12/02/2016 Genetic Testing    Negative genetic testing on the STAT panel.  The STAT Breast cancer panel offered by [facility] includes sequencing and rearrangement analysis for the following 9 genes:  ATM, BRCA1, BRCA2, CDH1, CHEK2, PALB2, PTEN, STK11 and TP53.   The report date is December 02, 2016.      12/14/2016 Surgery    Left modified radical mastectomy: IDC grade 3, spans 5 cm and a 2.2 cm with extensive DCIS grade 3, lymphovascular invasion present, margins negative, 17/17 lymph nodes positive, ER 30%, PR 0%, HER-2 positive, Ki-67 20%, T2 N3a M0 stage IIIB AJCC 8      01/10/2017 -  Chemotherapy    Adjuvant chemotherapy with TCH Perjeta 6 cycles followed by Herceptin Perjeta maintenance        CHIEF COMPLIANT: Cycle 5 TCH Perjeta  INTERVAL HISTORY: Diane Rosario is a 42 year old with above-mentioned history left breast cancer treated with mastectomy followed by adjuvant chemotherapy and today is cycle 5 TCH Perjeta.  She appears to be tolerating chemotherapy reasonably well.  She does have fatigue and mild nausea as result of chemo.  She did have diarrhea  which is improved with supportive medications.  REVIEW OF SYSTEMS:   Constitutional: Denies fevers, chills or abnormal weight loss Eyes: Denies blurriness of vision Ears, nose, mouth, throat, and face: Denies mucositis or sore throat Respiratory: Denies cough, dyspnea or wheezes Cardiovascular: Denies palpitation, chest discomfort Gastrointestinal: Diarrhea has improved, mild nausea intermittent Skin: Denies abnormal skin rashes Lymphatics: Denies new lymphadenopathy or easy bruising Neurological:Denies numbness, tingling or new weaknesses Behavioral/Psych: Mood is stable, no new changes  Extremities: No lower extremity edema Breast:  denies any pain or lumps or nodules in either breasts All other systems were reviewed with the patient and are negative.  I have reviewed the past medical history, past surgical history, social history and family history with the patient and they are unchanged from previous note.  ALLERGIES:  is allergic to asa [aspirin].  MEDICATIONS:  Current Outpatient Medications  Medication Sig Dispense Refill  . ALPRAZolam (XANAX) 0.5 MG tablet Take 0.5 mg by mouth 2 (two) times daily as needed for anxiety.     Marland Kitchen dexamethasone (DECADRON) 4 MG tablet Take 1 tablet (4 mg total) by mouth daily. Take 1 tablet day before chemotherapy and 1 tablet the day after chemotherapy 30 tablet 1  . ENSURE (ENSURE) Take 237 mLs by mouth daily.    . haloperidol (HALDOL) 10 MG tablet Take 10 mg by mouth at bedtime.   1  . HYDROcodone-acetaminophen (NORCO/VICODIN) 5-325 MG tablet Take 1-2 tablets by mouth every 4 (four) hours as  needed for moderate pain. 20 tablet 0  . lidocaine-prilocaine (EMLA) cream Apply to affected area once 30 g 3  . LORazepam (ATIVAN) 0.5 MG tablet Take 1 tablet (0.5 mg total) by mouth at bedtime. 30 tablet 0  . ondansetron (ZOFRAN) 8 MG tablet Take 1 tablet (8 mg total) by mouth 2 (two) times daily as needed for refractory nausea / vomiting. Start on day 3  after chemo. 30 tablet 1  . prochlorperazine (COMPAZINE) 10 MG tablet Take 1 tablet (10 mg total) by mouth every 6 (six) hours as needed (Nausea or vomiting). 30 tablet 1  . risperiDONE microspheres (RISPERDAL CONSTA) 50 MG injection Inject 50 mg into the muscle every 14 (fourteen) days.     No current facility-administered medications for this visit.    Facility-Administered Medications Ordered in Other Visits  Medication Dose Route Frequency Provider Last Rate Last Dose  . sodium chloride flush (NS) 0.9 % injection 10 mL  10 mL Intracatheter PRN Diane Lose, MD        PHYSICAL EXAMINATION: ECOG PERFORMANCE STATUS: 1 - Symptomatic but completely ambulatory  Vitals:   04/14/17 1223  BP: 106/81  Pulse: 100  Resp: 18  Temp: 98.5 F (36.9 C)  SpO2: 100%   Filed Weights   04/14/17 1223  Weight: 92 lb (41.7 kg)    GENERAL:alert, no distress and comfortable SKIN: skin color, texture, turgor are normal, no rashes or significant lesions EYES: normal, Conjunctiva are pink and non-injected, sclera clear OROPHARYNX:no exudate, no erythema and lips, buccal mucosa, and tongue normal  NECK: supple, thyroid normal size, non-tender, without nodularity LYMPH:  no palpable lymphadenopathy in the cervical, axillary or inguinal LUNGS: clear to auscultation and percussion with normal breathing effort HEART: regular rate & rhythm and no murmurs and no lower extremity edema ABDOMEN:abdomen soft, non-tender and normal bowel sounds MUSCULOSKELETAL:no cyanosis of digits and no clubbing  NEURO: alert & oriented x 3 with fluent speech, no focal motor/sensory deficits EXTREMITIES: No lower extremity edema  LABORATORY DATA:  I have reviewed the data as listed   Chemistry      Component Value Date/Time   NA 140 03/21/2017 1106   K 3.8 03/21/2017 1106   CL 104 12/15/2016 0532   CO2 26 03/21/2017 1106   BUN 14.7 03/21/2017 1106   CREATININE 0.9 03/21/2017 1106      Component Value Date/Time    CALCIUM 8.7 03/21/2017 1106   ALKPHOS 56 03/21/2017 1106   AST 41 (H) 03/21/2017 1106   ALT 25 03/21/2017 1106   BILITOT 0.25 03/21/2017 1106       Lab Results  Component Value Date   WBC 7.1 04/14/2017   HGB 10.4 (L) 04/14/2017   HCT 31.9 (L) 04/14/2017   MCV 102.6 (H) 04/14/2017   PLT 150 04/14/2017   NEUTROABS 4.7 04/14/2017    ASSESSMENT & PLAN:  Malignant neoplasm of upper-outer quadrant of left breast in female, estrogen receptor positive ([place]) 12/14/2016: Left modified radical mastectomy: IDC grade 3, spans 5 cm and a 2.2 cm with extensive DCIS grade 3, lymphovascular invasion present, margins negative, 17/17 lymph nodes positive, ER 30%, PR 0%, HER-2 positive, Ki-67 20%, T2 N3a M0 stage IIIB AJCC 8  Patient has long-standing issues with bipolar disorder and substance abuse previously. CT CAP and Bone Scan 11/17/2016: No metastases 11 mm left lower lobe groundglass nodule probably infectious etiology   Treatment plan:  1. Adjuvant chemotherapy with [place] Perjeta every 3 weeks 6 followed by Herceptin Perjeta  maintenance for1 year 3. Followed by adjuvant radiation 4. Followed by adjuvant antiestrogen therapy 5-7 years ------------------------------------------------------------------------- Current treatment: [place] Perjeta Echocardiogram: Ejection fraction 50% on 12/06/2016  Chemotherapy toxicities: 1. Chemotherapy-induced nausea for 2 days 2. Perjeta induced diarrhea:Improved 3.  Chemotherapy-induced anemia: Being monitored  Return to clinic in 3 weeks for cycle  6 After that she will go on every 3-week Herceptin and Perjeta maintenance for 1 year  I spent 25 minutes talking to the patient of which more than half was spent in counseling and coordination of care.  No orders of the defined types were placed in this encounter.  The patient has a good understanding of the overall plan. she agrees with it. she will call with any problems that may  develop before the next visit here.   Harriette Ohara, MD 04/14/17

## 2017-04-19 ENCOUNTER — Other Ambulatory Visit (HOSPITAL_COMMUNITY): Payer: Medicaid Other

## 2017-04-19 ENCOUNTER — Ambulatory Visit (HOSPITAL_COMMUNITY): Payer: Medicaid Other | Admitting: Internal Medicine

## 2017-04-19 ENCOUNTER — Telehealth: Payer: Self-pay | Admitting: Hematology and Oncology

## 2017-04-19 NOTE — Telephone Encounter (Signed)
Spoke with patient re next appointment 1/25. Patient to get updated schedule at 1/5 visit.

## 2017-04-21 ENCOUNTER — Telehealth: Payer: Self-pay | Admitting: Hematology and Oncology

## 2017-04-21 NOTE — Telephone Encounter (Signed)
Lvm advising appt chg on 1/25 from 11am to 9.45am and from [facility] to [facility] due to md pal.

## 2017-04-24 ENCOUNTER — Ambulatory Visit (HOSPITAL_COMMUNITY): Admission: RE | Admit: 2017-04-24 | Payer: Medicaid Other | Source: Ambulatory Visit

## 2017-04-24 ENCOUNTER — Ambulatory Visit (HOSPITAL_COMMUNITY): Payer: Medicaid Other | Admitting: Cardiology

## 2017-04-26 ENCOUNTER — Ambulatory Visit: Payer: Medicaid Other

## 2017-04-26 ENCOUNTER — Other Ambulatory Visit: Payer: Medicaid Other

## 2017-04-26 ENCOUNTER — Ambulatory Visit: Payer: Medicaid Other | Admitting: Hematology and Oncology

## 2017-05-05 ENCOUNTER — Encounter: Payer: Self-pay | Admitting: Adult Health

## 2017-05-05 ENCOUNTER — Telehealth: Payer: Self-pay | Admitting: Adult Health

## 2017-05-05 ENCOUNTER — Inpatient Hospital Stay (HOSPITAL_BASED_OUTPATIENT_CLINIC_OR_DEPARTMENT_OTHER): Payer: Medicaid Other | Admitting: Adult Health

## 2017-05-05 ENCOUNTER — Inpatient Hospital Stay: Payer: Medicaid Other

## 2017-05-05 VITALS — BP 105/63 | HR 97 | Temp 98.2°F | Resp 20 | Ht 66.0 in | Wt 91.9 lb

## 2017-05-05 DIAGNOSIS — C50812 Malignant neoplasm of overlapping sites of left female breast: Secondary | ICD-10-CM

## 2017-05-05 DIAGNOSIS — C50412 Malignant neoplasm of upper-outer quadrant of left female breast: Secondary | ICD-10-CM | POA: Diagnosis not present

## 2017-05-05 DIAGNOSIS — Z170 Estrogen receptor positive status [ER+]: Principal | ICD-10-CM

## 2017-05-05 DIAGNOSIS — Z9012 Acquired absence of left breast and nipple: Secondary | ICD-10-CM

## 2017-05-05 DIAGNOSIS — D6481 Anemia due to antineoplastic chemotherapy: Secondary | ICD-10-CM | POA: Diagnosis not present

## 2017-05-05 DIAGNOSIS — Z5112 Encounter for antineoplastic immunotherapy: Secondary | ICD-10-CM | POA: Diagnosis not present

## 2017-05-05 DIAGNOSIS — Z95828 Presence of other vascular implants and grafts: Secondary | ICD-10-CM

## 2017-05-05 LAB — CBC WITH DIFFERENTIAL/PLATELET
Basophils Absolute: 0 10*3/uL (ref 0.0–0.1)
Basophils Relative: 1 %
Eosinophils Absolute: 0 10*3/uL (ref 0.0–0.5)
Eosinophils Relative: 0 %
HCT: 25.7 % — ABNORMAL LOW (ref 34.8–46.6)
Hemoglobin: 8.4 g/dL — ABNORMAL LOW (ref 11.6–15.9)
Lymphocytes Relative: 47 %
Lymphs Abs: 2 10*3/uL (ref 0.9–3.3)
MCH: 34.6 pg — ABNORMAL HIGH (ref 25.1–34.0)
MCHC: 32.9 g/dL (ref 31.5–36.0)
MCV: 105.2 fL — ABNORMAL HIGH (ref 79.5–101.0)
Monocytes Absolute: 0.2 10*3/uL (ref 0.1–0.9)
Monocytes Relative: 6 %
Neutro Abs: 2 10*3/uL (ref 1.5–6.5)
Neutrophils Relative %: 46 %
Platelets: 168 10*3/uL (ref 145–400)
RBC: 2.44 MIL/uL — ABNORMAL LOW (ref 3.70–5.45)
RDW: 15.4 % (ref 11.2–16.1)
WBC: 4.3 10*3/uL (ref 3.9–10.3)

## 2017-05-05 LAB — COMPREHENSIVE METABOLIC PANEL
ALT: 5 U/L (ref 0–55)
AST: 15 U/L (ref 5–34)
Albumin: 3 g/dL — ABNORMAL LOW (ref 3.5–5.0)
Alkaline Phosphatase: 64 U/L (ref 40–150)
Anion gap: 8 (ref 3–11)
BUN: 14 mg/dL (ref 7–26)
CO2: 29 mmol/L (ref 22–29)
Calcium: 9.1 mg/dL (ref 8.4–10.4)
Chloride: 103 mmol/L (ref 98–109)
Creatinine, Ser: 0.84 mg/dL (ref 0.60–1.10)
GFR calc Af Amer: >60 mL/min (ref >60)
GFR calc non Af Amer: >60 mL/min (ref >60)
Glucose, Bld: 120 mg/dL (ref 70–140)
Potassium: 3.8 mmol/L (ref 3.3–4.7)
Sodium: 140 mmol/L (ref 136–145)
Total Bilirubin: <0.2 mg/dL — ABNORMAL LOW (ref 0.2–1.2)
Total Protein: 7.1 g/dL (ref 6.4–8.3)

## 2017-05-05 MED ORDER — SODIUM CHLORIDE 0.9 % IV SOLN
6.0000 mg/kg | Freq: Once | INTRAVENOUS | Status: AC
  Administered 2017-05-05: 252 mg via INTRAVENOUS
  Filled 2017-05-05: qty 12

## 2017-05-05 MED ORDER — PALONOSETRON HCL INJECTION 0.25 MG/5ML
0.2500 mg | Freq: Once | INTRAVENOUS | Status: AC
  Administered 2017-05-05: 0.25 mg via INTRAVENOUS

## 2017-05-05 MED ORDER — ACETAMINOPHEN 325 MG PO TABS
650.0000 mg | ORAL_TABLET | Freq: Once | ORAL | Status: AC
  Administered 2017-05-05: 650 mg via ORAL

## 2017-05-05 MED ORDER — DIPHENHYDRAMINE HCL 25 MG PO CAPS
50.0000 mg | ORAL_CAPSULE | Freq: Once | ORAL | Status: AC
Start: 2017-05-05 — End: 2017-05-05
  Administered 2017-05-05: 50 mg via ORAL

## 2017-05-05 MED ORDER — SODIUM CHLORIDE 0.9% FLUSH
10.0000 mL | INTRAVENOUS | Status: DC | PRN
  Administered 2017-05-05: 10 mL
  Filled 2017-05-05: qty 10

## 2017-05-05 MED ORDER — SODIUM CHLORIDE 0.9 % IV SOLN
420.0000 mg | Freq: Once | INTRAVENOUS | Status: AC
  Administered 2017-05-05: 420 mg via INTRAVENOUS
  Filled 2017-05-05: qty 14

## 2017-05-05 MED ORDER — SODIUM CHLORIDE 0.9% FLUSH
10.0000 mL | INTRAVENOUS | Status: DC | PRN
  Administered 2017-05-05: 10 mL via INTRAVENOUS
  Filled 2017-05-05: qty 10

## 2017-05-05 MED ORDER — ACETAMINOPHEN 325 MG PO TABS
ORAL_TABLET | ORAL | Status: AC
  Filled 2017-05-05: qty 2

## 2017-05-05 MED ORDER — HEPARIN SOD (PORK) LOCK FLUSH 100 UNIT/ML IV SOLN
500.0000 [IU] | Freq: Once | INTRAVENOUS | Status: AC | PRN
  Administered 2017-05-05: 500 [IU]
  Filled 2017-05-05: qty 5

## 2017-05-05 MED ORDER — SODIUM CHLORIDE 0.9 % IV SOLN
65.0000 mg/m2 | Freq: Once | INTRAVENOUS | Status: AC
  Administered 2017-05-05: 90 mg via INTRAVENOUS
  Filled 2017-05-05: qty 9

## 2017-05-05 MED ORDER — PEGFILGRASTIM 6 MG/0.6ML ~~LOC~~ PSKT
6.0000 mg | PREFILLED_SYRINGE | Freq: Once | SUBCUTANEOUS | Status: AC
  Administered 2017-05-05: 6 mg via SUBCUTANEOUS

## 2017-05-05 MED ORDER — DEXAMETHASONE SODIUM PHOSPHATE 10 MG/ML IJ SOLN
10.0000 mg | Freq: Once | INTRAMUSCULAR | Status: AC
  Administered 2017-05-05: 10 mg via INTRAVENOUS

## 2017-05-05 MED ORDER — SODIUM CHLORIDE 0.9 % IV SOLN
420.0000 mg | Freq: Once | INTRAVENOUS | Status: AC
  Administered 2017-05-05: 420 mg via INTRAVENOUS
  Filled 2017-05-05: qty 42

## 2017-05-05 MED ORDER — DIPHENHYDRAMINE HCL 25 MG PO CAPS
ORAL_CAPSULE | ORAL | Status: AC
  Filled 2017-05-05: qty 2

## 2017-05-05 MED ORDER — SODIUM CHLORIDE 0.9 % IV SOLN
Freq: Once | INTRAVENOUS | Status: AC
  Administered 2017-05-05: 12:00:00 via INTRAVENOUS

## 2017-05-05 MED ORDER — DOCETAXEL CHEMO INJECTION 160 MG/16ML: 65.0000 mg/m2 | Freq: Once | INTRAVENOUS | Status: DC

## 2017-05-05 MED ORDER — PEGFILGRASTIM 6 MG/0.6ML ~~LOC~~ PSKT
PREFILLED_SYRINGE | SUBCUTANEOUS | Status: AC
  Filled 2017-05-05: qty 0.6

## 2017-05-05 MED ORDER — DEXAMETHASONE SODIUM PHOSPHATE 10 MG/ML IJ SOLN
INTRAMUSCULAR | Status: AC
  Filled 2017-05-05: qty 1

## 2017-05-05 MED ORDER — PALONOSETRON HCL INJECTION 0.25 MG/5ML
INTRAVENOUS | Status: AC
  Filled 2017-05-05: qty 5

## 2017-05-05 NOTE — Assessment & Plan Note (Addendum)
12/14/2016: Left modified radical mastectomy: IDC grade 3, spans 5 cm and a 2.2 cm with extensive DCIS grade 3, lymphovascular invasion present, margins negative, 17/17 lymph nodes positive, ER 30%, PR 0%, HER-2 positive, Ki-67 20%, T2 N3a M0 stage IIIB AJCC 8  Patient has long-standing issues with bipolar disorder and substance abuse previously. CT CAP and Bone Scan 11/17/2016: No metastases 11 mm left lower lobe groundglass nodule probably infectious etiology   Treatment plan:  1. Adjuvant chemotherapy with [place] Perjeta every 3 weeks 6 followed by Herceptin Perjeta maintenance for1 year 2. Followed by adjuvant radiation 3. Followed by adjuvant antiestrogen therapy 5-7 years ------------------------------------------------------------------------- Current treatment: [place] Perjeta Echocardiogram: Ejection fraction 50% on 12/06/2016  Chemotherapy toxicities: 1. Chemotherapy-induced nausea for 2 days 2. Perjeta induced diarrhea:Improved 3.  Chemotherapy-induced anemia  She is stable today and tolerating treatment well.  She will proceed with her final cycle of TCHP.  I requested appt with Dr. Lisbeth Renshaw to discuss radiation.    Recent upper respiratory infection: Resolved  Return to clinic in 3 weeks for labs/Herceptin, 6 weeks for labs, f/u with Dr. Lindi Adie, Herceptin.

## 2017-05-05 NOTE — Telephone Encounter (Signed)
Patient should be contacted by Dr. Ida Rogue office for an appt.

## 2017-05-05 NOTE — Patient Instructions (Signed)
[facility] Discharge Instructions for Patients Receiving Chemotherapy  Today you received the following chemotherapy agents Herceptin, Perjeta, Taxotere and Carboplatin   To help prevent nausea and vomiting after your treatment, we encourage you to take your nausea medication as directed.    If you develop nausea and vomiting that is not controlled by your nausea medication, call the clinic.   BELOW ARE SYMPTOMS THAT SHOULD BE REPORTED IMMEDIATELY:  *FEVER GREATER THAN 100.5 F  *CHILLS WITH OR WITHOUT FEVER  NAUSEA AND VOMITING THAT IS NOT CONTROLLED WITH YOUR NAUSEA MEDICATION  *UNUSUAL SHORTNESS OF BREATH  *UNUSUAL BRUISING OR BLEEDING  TENDERNESS IN MOUTH AND THROAT WITH OR WITHOUT PRESENCE OF ULCERS  *URINARY PROBLEMS  *BOWEL PROBLEMS  UNUSUAL RASH Items with * indicate a potential emergency and should be followed up as soon as possible.  Feel free to call the clinic should you have any questions or concerns. The clinic phone number is [phone].  Please show the CHEMO ALERT CARD at check-in to the Emergency Department and triage nurse.   

## 2017-05-05 NOTE — Progress Notes (Signed)
[facility] Follow up:    Department, [facility] [place] [address]   DIAGNOSIS: Cancer Staging Malignant neoplasm of upper-outer quadrant of left breast in female, estrogen receptor positive ([place]) Staging form: Breast, AJCC 8th Edition - Clinical stage from 11/09/2016: Stage IIB (cT2, cN1, cM0, G3, ER: Positive, PR: Negative, HER2: Positive) - Unsigned   SUMMARY OF ONCOLOGIC HISTORY:   Malignant neoplasm of upper-outer quadrant of left breast in female, estrogen receptor positive ([place])   11/01/2016 Initial Diagnosis    Palpable left breast mass with 4 enl axillary lymph nodes 4.8 x 0.8 x 3.1 cm; left axillary mass 1.1 x 0.5 cm, lymph node 1.4 x 0.8 cm biopsy grade 2-3 IDC ER 30% PR 0% HER-2 positive ratio 7.53, Ki-67 20%, T2 N2 MX stage IIIa clinical stage      12/02/2016 Genetic Testing    Negative genetic testing on the STAT panel.  The STAT Breast cancer panel offered by [facility] includes sequencing and rearrangement analysis for the following 9 genes:  ATM, BRCA1, BRCA2, CDH1, CHEK2, PALB2, PTEN, STK11 and TP53.   The report date is December 02, 2016.      12/14/2016 Surgery    Left modified radical mastectomy: IDC grade 3, spans 5 cm and a 2.2 cm with extensive DCIS grade 3, lymphovascular invasion present, margins negative, 17/17 lymph nodes positive, ER 30%, PR 0%, HER-2 positive, Ki-67 20%, T2 N3a M0 stage IIIB AJCC 8      01/10/2017 -  Chemotherapy    Adjuvant chemotherapy with TCH Perjeta 6 cycles followed by Herceptin Perjeta maintenance        CURRENT THERAPY: Cycle 6 of TCHP  INTERVAL HISTORY: Diane Rosario 42 y.o. female returns for evaluation prior to her TCHP.  She continues to do well and has no complaints, particularly denying peripheral neuropathy.     Patient Active Problem List   Diagnosis Date Noted  . Port-A-Cath in place 01/31/2017  . Cancer of overlapping sites of left female breast ([place]) 12/14/2016   . Genetic testing 12/07/2016  . Malignant neoplasm of upper-outer quadrant of left breast in female, estrogen receptor positive ([place]) 11/03/2016  . Posttraumatic stress disorder 09/06/2012  . Major depressive disorder, recurrent episode, severe, specified as with psychotic behavior 09/06/2012  . Cannabis abuse 09/06/2012  . Cocaine abuse ([place]) 09/06/2012    is allergic to asa [aspirin].  MEDICAL HISTORY: Past Medical History:  Diagnosis Date  . Anxiety   . Bipolar 1 disorder ([place])    pt reports bipolar, pt reports Auditory hallucinations  . COPD (chronic obstructive pulmonary disease) ([place])   . Depression   . Malignant neoplasm of upper-outer quadrant of left breast in female, estrogen receptor positive ([place]) 11/03/2016  . PTSD (post-traumatic stress disorder)    husband was murdered    SURGICAL HISTORY: Past Surgical History:  Procedure Laterality Date  . EYE SURGERY     both eyes ; correct weak eye muscles   . MASTECTOMY MODIFIED RADICAL Left 12/14/2016   Procedure: LEFT MODIFIED RADICAL MASTECTOMY WITH ERAS PATHWAY;  Surgeon: [name], MD;  Location: [facility];  Service: General;  Laterality: Left;  . PORTACATH PLACEMENT Right 12/14/2016   Procedure: INSERTION PORT-A-CATH WITH ULTRASOUND;  Surgeon: [name], MD;  Location: [facility];  Service: General;  Laterality: Right;    SOCIAL HISTORY: Social History   Socioeconomic History  . Marital status: Widowed    Spouse name: Not on file  .  Number of children: Not on file  . Years of education: Not on file  . Highest education level: Not on file  Social Needs  . Financial resource strain: Not on file  . Food insecurity - worry: Not on file  . Food insecurity - inability: Not on file  . Transportation needs - medical: Not on file  . Transportation needs - non-medical: Not on file  Occupational History  . Not on file  Tobacco Use  . Smoking status: Current Every Day Smoker    Packs/day: 0.25    Years: 10.00     Pack years: 2.50    Types: Cigarettes  . Smokeless tobacco: Never Used  Substance and Sexual Activity  . Alcohol use: Yes    Comment: occasionally; 1 per week   . Drug use: Yes    Types: Marijuana, Cocaine    Comment: 1 month since use of marijuana ; uses cocaine at least 1-2 times per weekl ; say s trying to wean off  . Sexual activity: Yes  Other Topics Concern  . Not on file  Social History Narrative  . Not on file    FAMILY HISTORY: Family History  Problem Relation Age of Onset  . Hypertension Maternal Aunt   . Hypertension Maternal Grandmother   . Cirrhosis Father   . Cancer Father        unknown type    Review of Systems  Constitutional: Negative for appetite change, chills, fatigue, fever and unexpected weight change.  HENT:   Negative for hearing loss, lump/mass and mouth sores.   Eyes: Negative for eye problems and icterus.  Respiratory: Negative for chest tightness, cough and shortness of breath.   Cardiovascular: Negative for chest pain, leg swelling and palpitations.  Gastrointestinal: Negative for abdominal distention, abdominal pain, constipation, diarrhea, nausea and vomiting.  Endocrine: Negative for hot flashes.  Genitourinary: Negative for difficulty urinating.   Musculoskeletal: Negative for arthralgias.  Skin: Negative for itching and rash.  Neurological: Negative for dizziness, extremity weakness, headaches and numbness.  Hematological: Negative for adenopathy. Does not bruise/bleed easily.  Psychiatric/Behavioral: Negative for depression. The patient is not nervous/anxious.       PHYSICAL EXAMINATION  ECOG PERFORMANCE STATUS: 0 - Asymptomatic  Vitals:   05/05/17 1033  BP: 105/63  Pulse: 97  Resp: 20  Temp: 98.2 F (36.8 C)  SpO2: 100%    Physical Exam  Constitutional: She is oriented to person, place, and time and well-developed, well-nourished, and in no distress.  HENT:  Head: Normocephalic and atraumatic.  Mouth/Throat: No  oropharyngeal exudate.  Eyes: Pupils are equal, round, and reactive to light. No scleral icterus.  Neck: Neck supple.  Cardiovascular: Normal rate, regular rhythm, normal heart sounds and intact distal pulses.  Pulmonary/Chest: Effort normal and breath sounds normal.  Abdominal: Soft. Bowel sounds are normal. She exhibits no distension and no mass. There is no tenderness. There is no rebound and no guarding.  Musculoskeletal: She exhibits no edema.  Lymphadenopathy:    She has no cervical adenopathy.  Neurological: She is alert and oriented to person, place, and time.  Skin: Skin is warm and dry. No rash noted.  Psychiatric: Mood and affect normal.    LABORATORY DATA:  CBC    Component Value Date/Time   WBC 4.3 05/05/2017 0941   RBC 2.44 (L) 05/05/2017 0941   HGB 8.4 (L) 05/05/2017 0941   HGB 10.4 (L) 04/14/2017 1144   HCT 25.7 (L) 05/05/2017 0941   HCT 31.9 (L)  04/14/2017 1144   PLT 168 05/05/2017 0941   PLT 150 04/14/2017 1144   MCV 105.2 (H) 05/05/2017 0941   MCV 102.6 (H) 04/14/2017 1144   MCH 34.6 (H) 05/05/2017 0941   MCHC 32.9 05/05/2017 0941   RDW 15.4 05/05/2017 0941   RDW 15.6 (H) 04/14/2017 1144   LYMPHSABS 2.0 05/05/2017 0941   LYMPHSABS 2.0 04/14/2017 1144   MONOABS 0.2 05/05/2017 0941   MONOABS 0.4 04/14/2017 1144   EOSABS 0.0 05/05/2017 0941   EOSABS 0.0 04/14/2017 1144   BASOSABS 0.0 05/05/2017 0941   BASOSABS 0.0 04/14/2017 1144    CMP     Component Value Date/Time   NA 140 05/05/2017 0941   NA 136 04/14/2017 1144   K 3.8 05/05/2017 0941   K 4.3 04/14/2017 1144   CL 103 05/05/2017 0941   CO2 29 05/05/2017 0941   CO2 28 04/14/2017 1144   GLUCOSE 120 05/05/2017 0941   GLUCOSE 80 04/14/2017 1144   BUN 14 05/05/2017 0941   BUN 15.5 04/14/2017 1144   CREATININE 0.84 05/05/2017 0941   CREATININE 1.0 04/14/2017 1144   CALCIUM 9.1 05/05/2017 0941   CALCIUM 9.4 04/14/2017 1144   PROT 7.1 05/05/2017 0941   PROT 7.5 04/14/2017 1144   ALBUMIN 3.0  (L) 05/05/2017 0941   ALBUMIN 3.5 04/14/2017 1144   AST 15 05/05/2017 0941   AST 21 04/14/2017 1144   ALT 5 05/05/2017 0941   ALT 12 04/14/2017 1144   ALKPHOS 64 05/05/2017 0941   ALKPHOS 74 04/14/2017 1144   BILITOT <0.2 (L) 05/05/2017 0941   BILITOT 0.59 04/14/2017 1144   GFRNONAA >60 05/05/2017 0941   GFRAA >60 05/05/2017 0941     ASSESSMENT and PLAN:   Malignant neoplasm of upper-outer quadrant of left breast in female, estrogen receptor positive ([place]) 12/14/2016: Left modified radical mastectomy: IDC grade 3, spans 5 cm and a 2.2 cm with extensive DCIS grade 3, lymphovascular invasion present, margins negative, 17/17 lymph nodes positive, ER 30%, PR 0%, HER-2 positive, Ki-67 20%, T2 N3a M0 stage IIIB AJCC 8  Patient has long-standing issues with bipolar disorder and substance abuse previously. CT CAP and Bone Scan 11/17/2016: No metastases 11 mm left lower lobe groundglass nodule probably infectious etiology   Treatment plan:  1. Adjuvant chemotherapy with [place] Perjeta every 3 weeks 6 followed by Herceptin Perjeta maintenance for1 year 2. Followed by adjuvant radiation 3. Followed by adjuvant antiestrogen therapy 5-7 years ------------------------------------------------------------------------- Current treatment: [place] Perjeta Echocardiogram: Ejection fraction 50% on 12/06/2016  Chemotherapy toxicities: 1. Chemotherapy-induced nausea for 2 days 2. Perjeta induced diarrhea:Improved 3.  Chemotherapy-induced anemia  She is stable today and tolerating treatment well.  She will proceed with her final cycle of TCHP.  I requested appt with Dr. Lisbeth Renshaw to discuss radiation.    Recent upper respiratory infection: Resolved  Return to clinic in 3 weeks for labs/Herceptin, 6 weeks for labs, f/u with Dr. Lindi Adie, Herceptin.     All questions were answered. The patient knows to call the clinic with any problems, questions or concerns. We can certainly see the  patient much sooner if necessary.  A total of (30) minutes of face-to-face time was spent with this patient with greater than 50% of that time in counseling and care-coordination.  This note was electronically signed. Scot Dock, NP 05/05/2017

## 2017-05-08 ENCOUNTER — Ambulatory Visit (HOSPITAL_COMMUNITY): Admission: RE | Admit: 2017-05-08 | Payer: Medicaid Other | Source: Ambulatory Visit

## 2017-05-08 ENCOUNTER — Ambulatory Visit (HOSPITAL_COMMUNITY): Payer: Medicaid Other | Admitting: Internal Medicine

## 2017-05-09 ENCOUNTER — Other Ambulatory Visit: Payer: Self-pay | Admitting: *Deleted

## 2017-05-09 ENCOUNTER — Encounter: Payer: Self-pay | Admitting: Radiation Oncology

## 2017-05-09 DIAGNOSIS — Z170 Estrogen receptor positive status [ER+]: Principal | ICD-10-CM

## 2017-05-09 DIAGNOSIS — C50412 Malignant neoplasm of upper-outer quadrant of left female breast: Secondary | ICD-10-CM

## 2017-05-16 NOTE — Progress Notes (Signed)
Location of Breast Cancer: Upper-outer quadrant of left breast in female   Histology per Pathology Report: Diagnosis  11-01-16 1. Breast, left, needle core biopsy, upper outer quadrant - INVASIVE DUCTAL CARCINOMA. - DUCTAL CARCINOMA IN SITU WITH CALCIFICATIONS. - LYMPHOVASCULAR INVASION IS IDENTIFIED. - SEE COMMENT. 2. Breast, left, needle core biopsy, 1:00 o'clock - INVASIVE DUCTAL CARCINOMA. - DUCTAL CARCINOMA IN SITU. - SEE COMMENT. 3. Lymph node, needle/core biopsy, left axilla - SMALL FOCUS OF CARCINOMA. - SEE COMMENT. 4. Lymph node, needle/core biopsy, left axilla - METASTATIC CARCINOMA IN 1 OF 1 LYMPH NODE (1/1).   Receptor Status: ER(30 % +), PR (0 % -), Her2-neu (+), Ki-(20 %)  Did patient present with symptoms (if so, please note symptoms) or was this found on screening mammography?: Patient palpated a mass in the left breast and mammogram  Past/Anticipated interventions by surgeon, if any: Diagnosis 12-14-16 Dr. [name] 1. Breast, modified radical mastectomy , left INVASIVE DUCTAL CARCINOMA, GRADE 3, SPANNING 5.0 CM AND 2.2 CM EXTENSIVE DUCTAL CARCINOMA IN SITU, GRADE 3 LYMPHOVASCULAR INVASION IS IDENTIFIED MARGINS OF RESECTION ARE NEGATIVE FOR CARCINOMA DUCTAL CARCINOMA IN SITU IS FOCALLY WITHIN 1 MM FROM THE DEEPER MARGIN METASTATIC DUCTAL CARCINOMA IN SIXTEEN LYMPH NODES (16/16) 2. Lymph nodes, regional resection, additional axillary contents METASTATIC DUCTAL CARCINOMA IN ONE OF ONE LYMPH NODE (1/1) Receptor Status: ER(30 % +), PR (0 % -), Her2-neu (+), Ki-(20 %)    Past/Anticipated interventions by medical oncology, if any: Chemotherapy yes 01-10-17 Adjuvant chemotherapy with TCH Perjeta 6 cycles followed by Herceptin Perjeta maintenance for one year                                                                                                                                               Followed by adjuvant radiation                                                                                                                Followed by adjuvant antiestrogen therapy x 5-7 years  12-02-16 Genetic Testing negative result   Lymphedema issues, if any: No Rom to left arm good. Skin to left  breast healed well post op had follow up appointment two weeks post op and in October 2018 with [name] will see again in six months.  She plans to call Dr. Nicholaus Corolla office about removing her port-a-cath.  Pain issues, if any: No  SAFETY ISSUES:  Prior radiation? : No  Pacemaker/ICD? : No  Possible current pregnancy? :No  Is the patient on methotrexate? : No  Menarche 13   G1 P1    BC 3 years   Menopause 40 HRT N/A  Current Complaints / other details:     Wt Readings from Last 3 Encounters:  05/18/17 93 lb 6.4 oz (42.4 kg)  05/05/17 91 lb 14.4 oz (41.7 kg)  04/14/17 92 lb (41.7 kg)  BP (!) 97/49 (BP Location: Right Arm, Patient Position: Sitting, Cuff Size: Small)   Pulse 98   Temp 98.5 F (36.9 C) (Oral)   Resp 18   Ht _0  (1.676 m)   Wt 93 lb 6.4 oz (42.4 kg)   LMP 01/10/2008   SpO2 100%   BMI 15.08 kg/m   Georgena Spurling, RN 05/16/2017,3:57 PM

## 2017-05-18 ENCOUNTER — Encounter: Payer: Self-pay | Admitting: Radiation Oncology

## 2017-05-18 ENCOUNTER — Other Ambulatory Visit: Payer: Self-pay

## 2017-05-18 ENCOUNTER — Ambulatory Visit
Admission: RE | Admit: 2017-05-18 | Discharge: 2017-05-18 | Disposition: A | Discharge status: Home / Self Care | Payer: Medicaid Other | Source: Ambulatory Visit | Attending: Radiation Oncology | Admitting: Radiation Oncology

## 2017-05-18 VITALS — BP 97/49 | HR 98 | Temp 98.5°F | Resp 18 | Ht 66.0 in | Wt 93.4 lb

## 2017-05-18 DIAGNOSIS — Z9012 Acquired absence of left breast and nipple: Secondary | ICD-10-CM | POA: Insufficient documentation

## 2017-05-18 DIAGNOSIS — Z886 Allergy status to analgesic agent status: Secondary | ICD-10-CM | POA: Insufficient documentation

## 2017-05-18 DIAGNOSIS — Z9221 Personal history of antineoplastic chemotherapy: Secondary | ICD-10-CM | POA: Diagnosis not present

## 2017-05-18 DIAGNOSIS — Z8719 Personal history of other diseases of the digestive system: Secondary | ICD-10-CM | POA: Insufficient documentation

## 2017-05-18 DIAGNOSIS — F419 Anxiety disorder, unspecified: Secondary | ICD-10-CM | POA: Insufficient documentation

## 2017-05-18 DIAGNOSIS — F17210 Nicotine dependence, cigarettes, uncomplicated: Secondary | ICD-10-CM | POA: Diagnosis not present

## 2017-05-18 DIAGNOSIS — F319 Bipolar disorder, unspecified: Secondary | ICD-10-CM | POA: Insufficient documentation

## 2017-05-18 DIAGNOSIS — C50412 Malignant neoplasm of upper-outer quadrant of left female breast: Secondary | ICD-10-CM

## 2017-05-18 DIAGNOSIS — Z170 Estrogen receptor positive status [ER+]: Secondary | ICD-10-CM | POA: Insufficient documentation

## 2017-05-18 DIAGNOSIS — Z79899 Other long term (current) drug therapy: Secondary | ICD-10-CM | POA: Diagnosis not present

## 2017-05-18 DIAGNOSIS — F4310 Post-traumatic stress disorder, unspecified: Secondary | ICD-10-CM | POA: Diagnosis not present

## 2017-05-18 DIAGNOSIS — J449 Chronic obstructive pulmonary disease, unspecified: Secondary | ICD-10-CM | POA: Insufficient documentation

## 2017-05-18 NOTE — Addendum Note (Signed)
Encounter addended by: Malena Edman, RN on: 05/18/2017 2:30 PM  Actions taken: Charge Capture section accepted

## 2017-05-18 NOTE — Progress Notes (Signed)
Radiation Oncology         [phone] ________________________________  Name: Diane Rosario        MRN: [id number]  Date of Service: 05/18/2017 DOB: Jun 24, 1975  JO:[id number], [facility]  Nicholas Lose, MD     REFERRING PHYSICIAN: Nicholas Lose, MD   DIAGNOSIS: The encounter diagnosis was Malignant neoplasm of upper-outer quadrant of left breast in female, estrogen receptor positive ([place]).   HISTORY OF PRESENT ILLNESS: Diane Rosario is a 42 y.o. female originally seen in the multidisciplinary breast conference for a new diagnosis of left  breast cancer. The patient palpated a mass in the left breast and underwent diagnostic mammogram and ultrasound. Calcifications and dense breast tissue was noted. ultrasound revealed a 4.8 x .8 x 3.1 cm mass at 1:00, and a 1.1 x .5 cm axillary node, a separate node measuring 1.4 x .8 cm. Two breast biopsies were performed as well as biopsies of the abnormalities in the axilla and each revealed at least grade 2, per pathology appeared more consistent with grade 3, ER weakly positive at 30%, PR negative, and HER2 amplified with a ratio of 7.53. Her Ki 67 was 20%.   She underwent genetic testing which was noted to be negative.  She then underwent modified left radical mastectomy on 12/14/2016 revealing a grade 3 invasive ductal carcinoma measuring 5 cm and a 2.2 cm area of DCIS, high-grade with LVSI and negative margins.  17 out of the 17 nodes that were sampled during axillary dissection were positive for disease, and she began adjuvant chemotherapy with Taxol Cytoxan and Herceptin and per [place] x6 cycles followed by Herceptin maintenance, she began this regimen on 10-18, and completed her course of Taxol and Cytoxan on 05/05/2017.  She comes today to discuss the role of adjuvant radiotherapy to the chest wall and regional lymph nodes.  PREVIOUS RADIATION THERAPY: No   PAST MEDICAL HISTORY:  Past Medical History:  Diagnosis Date  . Anxiety   .  Bipolar 1 disorder ([place])    pt reports bipolar, pt reports Auditory hallucinations  . COPD (chronic obstructive pulmonary disease) ([place])   . Depression   . Malignant neoplasm of upper-outer quadrant of left breast in female, estrogen receptor positive ([place]) 11/03/2016  . PTSD (post-traumatic stress disorder)    husband was murdered       PAST SURGICAL HISTORY: Past Surgical History:  Procedure Laterality Date  . EYE SURGERY     both eyes ; correct weak eye muscles   . MASTECTOMY MODIFIED RADICAL Left 12/14/2016   Procedure: LEFT MODIFIED RADICAL MASTECTOMY WITH ERAS PATHWAY;  Surgeon: [name], MD;  Location: [facility];  Service: General;  Laterality: Left;  . PORTACATH PLACEMENT Right 12/14/2016   Procedure: INSERTION PORT-A-CATH WITH ULTRASOUND;  Surgeon: [name], MD;  Location: [facility];  Service: General;  Laterality: Right;     FAMILY HISTORY:  Family History  Problem Relation Age of Onset  . Hypertension Maternal Aunt   . Hypertension Maternal Grandmother   . Cirrhosis Father   . Cancer Father        unknown type     SOCIAL HISTORY:  reports that she has been smoking cigarettes.  She has a 2.50 pack-year smoking history. she has never used smokeless tobacco. She reports that she drinks alcohol. She reports that she uses drugs. Drugs: Marijuana and Cocaine.The patient is widowed and lives in [place]. She lives in [place] and is accompanied by her maternal uncle "[place]." She  has one son who lives in [place] also. She lives with a roommate.  ALLERGIES: Asa [aspirin]   MEDICATIONS:  Current Outpatient Medications  Medication Sig Dispense Refill  . ALPRAZolam (XANAX) 0.5 MG tablet Take 0.5 mg by mouth 2 (two) times daily as needed for anxiety.     . ENSURE (ENSURE) Take 237 mLs by mouth daily.    Marland Kitchen lidocaine-prilocaine (EMLA) cream Apply to affected area once 30 g 3  . LORazepam (ATIVAN) 0.5 MG tablet Take 1 tablet (0.5 mg total) by mouth at bedtime. 30  tablet 0  . risperiDONE microspheres (RISPERDAL CONSTA) 50 MG injection Inject 50 mg into the muscle every 14 (fourteen) days.    . ondansetron (ZOFRAN) 8 MG tablet Take 1 tablet (8 mg total) by mouth 2 (two) times daily as needed for refractory nausea / vomiting. Start on day 3 after chemo. (Patient not taking: Reported on 05/18/2017) 30 tablet 1  . prochlorperazine (COMPAZINE) 10 MG tablet Take 1 tablet (10 mg total) by mouth every 6 (six) hours as needed (Nausea or vomiting). (Patient not taking: Reported on 05/18/2017) 30 tablet 1   No current facility-administered medications for this encounter.    Facility-Administered Medications Ordered in Other Encounters  Medication Dose Route Frequency Provider Last Rate Last Dose  . sodium chloride flush (NS) 0.9 % injection 10 mL  10 mL Intracatheter PRN Nicholas Lose, MD         REVIEW OF SYSTEMS: On review of systems, the patient reports that she is doing well overall. She denies any chest pain, shortness of breath, cough, fevers, chills, night sweats, unintended weight changes. She denies any bowel or bladder disturbances, and denies abdominal pain, nausea or vomiting. She denies any new musculoskeletal or joint aches or pains, new skin lesions or concerns. A complete review of systems is obtained and is otherwise negative.    PHYSICAL EXAM:  Wt Readings from Last 3 Encounters:  05/18/17 93 lb 6.4 oz (42.4 kg)  05/05/17 91 lb 14.4 oz (41.7 kg)  04/14/17 92 lb (41.7 kg)   Temp Readings from Last 3 Encounters:  05/18/17 98.5 F (36.9 C) (Oral)  05/05/17 98.2 F (36.8 C) (Oral)  04/14/17 98.9 F (37.2 C) (Oral)   BP Readings from Last 3 Encounters:  05/18/17 (!) 97/49  05/05/17 105/63  04/14/17 107/82   Pulse Readings from Last 3 Encounters:  05/18/17 98  05/05/17 97  04/14/17 92    In general this is a cachectic appearing African American female in no acute distress. She is alert and oriented x4 and appropriate throughout the  examination. HEENT reveals that the patient is normocephalic, atraumatic. EOMs are intact. PERRLA. Skin is intact without any evidence of gross lesions. Cardiopulmonary assessment is negative for acute distress and she exhibits normal effort.   ECOG = 1  0 - Asymptomatic (Fully active, able to carry on all predisease activities without restriction)  1 - Symptomatic but completely ambulatory (Restricted in physically strenuous activity but ambulatory and able to carry out work of a light or sedentary nature. For example, light housework, office work)  2 - Symptomatic, <50% in bed during the day (Ambulatory and capable of all self care but unable to carry out any work activities. Up and about more than 50% of waking hours)  3 - Symptomatic, >50% in bed, but not bedbound (Capable of only limited self-care, confined to bed or chair 50% or more of waking hours)  4 - Bedbound (Completely disabled. Cannot carry  on any self-care. Totally confined to bed or chair)  5 - Death   Eustace Pen MM, Creech RH, Tormey DC, et al. [phone]). "Toxicity and response criteria of the [facility]". [place] Oncol. 5 (6): 649-55    LABORATORY DATA:  Lab Results  Component Value Date   WBC 4.3 05/05/2017   HGB 8.4 (L) 05/05/2017   HCT 25.7 (L) 05/05/2017   MCV 105.2 (H) 05/05/2017   PLT 168 05/05/2017   Lab Results  Component Value Date   NA 140 05/05/2017   K 3.8 05/05/2017   CL 103 05/05/2017   CO2 29 05/05/2017   Lab Results  Component Value Date   ALT 5 05/05/2017   AST 15 05/05/2017   ALKPHOS 64 05/05/2017   BILITOT <0.2 (L) 05/05/2017      RADIOGRAPHY: No results found.     IMPRESSION/PLAN: 1. 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 Dr. Lisbeth Renshaw reviews her prior pathology and course since her last visit with [place] and discusses that he would recommend proceeding with external radiotherapy to the breast and regional  nodes followed by antiestrogen therapy. We discussed the risks, benefits, short, and long term effects of radiotherapy, and the patient is interested in proceeding. Dr. Lisbeth Renshaw discusses the delivery and logistics of radiotherapy and would anticipate a course of 6 1/2 weeks of radiotherapy to the breast and regional nodes with deep inspiration breath hold technique. Written consent is obtained and placed in the chart, a copy was provided to the patient. She will simulate in the next week and will be contacted by our staff to schedule this appointment.   in a visit lasting 25 minutes, greater than 50% of the time was spent face to face discussing her case, and coordinating the patient's care.  The above documentation reflects my direct findings during this shared patient visit. Please see the separate note by Dr. Lisbeth Renshaw on this date for the remainder of the patient's plan of care.    Carola Rhine, PAC

## 2017-05-25 ENCOUNTER — Other Ambulatory Visit: Payer: Self-pay | Admitting: *Deleted

## 2017-05-25 ENCOUNTER — Ambulatory Visit
Admission: RE | Admit: 2017-05-25 | Discharge: 2017-05-25 | Disposition: A | Discharge status: Home / Self Care | Payer: Medicaid Other | Source: Ambulatory Visit | Attending: Radiation Oncology | Admitting: Radiation Oncology

## 2017-05-25 ENCOUNTER — Telehealth: Payer: Self-pay | Admitting: Radiation Oncology

## 2017-05-25 DIAGNOSIS — C50412 Malignant neoplasm of upper-outer quadrant of left female breast: Secondary | ICD-10-CM | POA: Insufficient documentation

## 2017-05-25 DIAGNOSIS — Z170 Estrogen receptor positive status [ER+]: Secondary | ICD-10-CM | POA: Diagnosis not present

## 2017-05-25 DIAGNOSIS — Z510 Encounter for antineoplastic radiation therapy: Secondary | ICD-10-CM | POA: Insufficient documentation

## 2017-05-25 NOTE — Telephone Encounter (Signed)
I LM for Mr. Diane Sills, Ms. Ferrick's uncle who called yesterday and I asked him to call me back to review his concerns.

## 2017-05-26 ENCOUNTER — Other Ambulatory Visit: Payer: Medicaid Other

## 2017-05-26 ENCOUNTER — Telehealth: Payer: Self-pay

## 2017-05-26 ENCOUNTER — Inpatient Hospital Stay: Payer: Medicaid Other | Admitting: Nutrition

## 2017-05-26 ENCOUNTER — Ambulatory Visit: Payer: Medicaid Other

## 2017-05-26 NOTE — Telephone Encounter (Signed)
Called pt to confirm that she will not get infusion treatment today until she gets an updated echocardiogram, per Dr.Gudena. Pt understands the need for echo and willing to get it done as soon as possible. Scheduled pt for next week and pt will be back in March for her next herceptin/perjeta infusion. Notified pharmacy and is aware.  Pt confirmed time/date of echo next week.

## 2017-05-27 NOTE — Progress Notes (Signed)
  Radiation Oncology         [phone] ________________________________  Name: Diane Rosario MRN: [id number]  Date: 05/25/2017  DOB: 10-Feb-1976  Optical Surface Tracking Plan:  Since intensity modulated radiotherapy (IMRT) and 3D conformal radiation treatment methods are predicated on accurate and precise positioning for treatment, intrafraction motion monitoring is medically necessary to ensure accurate and safe treatment delivery.  The ability to quantify intrafraction motion without excessive ionizing radiation dose can only be performed with optical surface tracking. Accordingly, surface imaging offers the opportunity to obtain 3D measurements of patient position throughout IMRT and 3D treatments without excessive radiation exposure.  I am ordering optical surface tracking for this patient's upcoming course of radiotherapy. ________________________________  Diane Rudd, MD 05/27/2017 8:35 PM    Reference:   Diane Rosario, J, et al. Surface imaging-based analysis of intrafraction motion for breast radiotherapy patients.Journal of [place], n. 6, nov. 2014. ISSN [id number].   Available at: <http://www.jacmp.org/index.php/jacmp/article/view/4957>.

## 2017-05-27 NOTE — Progress Notes (Signed)
  Radiation Oncology         [phone]) [phone] ________________________________  Name: Diane Rosario MRN: [id number]  Date: 05/25/2017  DOB: 1976/02/03  Diagnosis DIAGNOSIS:     ICD-10-CM   1. Malignant neoplasm of upper-outer quadrant of left breast in female, estrogen receptor positive ([place]) C50.412    Z17.0      SIMULATION AND TREATMENT PLANNING NOTE  The patient presented for simulation prior to beginning her course of radiation treatment for her diagnosis of left-sided breast cancer. The patient was placed in a supine position on a breast board. A customized vac-lock bag was also constructed and this complex treatment device will be used on a daily basis during her treatment. In this fashion, a CT scan was obtained through the chest area and an isocenter was placed near the chest wall at the upper aspect of the right chest. A breath-hold technique has also been evaluated to determine if this significantly improves the spatial relationship between the target region and the heart. Based on this analysis, a breath-hold technique has been ordered for the patient's treatment.  The patient will be planned to receive a course of radiation initially to a dose of 50.4 gray. This will consist of a 4 field technique targeting the left chest wall as well as the supraclavicular region. Therefore 2 customized medial and lateral tangent fields have been created targeting the chest wall, and also 2 additional customized fields have been designed to treat the supraclavicular region both with a left supraclavicular field and a left posterior axillary boost field. A forward planning/reduced field technique will also be evaluated to determine if this significantly improves the dose homogeneity of the overall plan. Therefore, additional customized blocks/fields may be necessary.  This initial treatment will be accomplished at 1.8 gray per fraction.   The initial plan will consist of a 3-D conformal technique. The  target volume/scar, heart and lungs have been contoured and dose volume histograms of each of these structures will be evaluated as part of the 3-D conformal treatment planning process.   It is anticipated that the patient will then receive a 10 gray boost to the surgical scar. This will be accomplished at 2 gray per fraction. The final anticipated total dose therefore will correspond to 60.4 gray.   Special treatment procedure was performed today due to the extra time and effort required by myself to plan and prepare this patient for deep inspiration breath hold technique.  I have determined cardiac sparing to be of benefit to this patient to prevent long term cardiac damage due to radiation of the heart.  Bellows were placed on the patient's abdomen. To facilitate cardiac sparing, the patient was coached by the radiation therapists on breath hold techniques and breathing practice was performed. Practice waveforms were obtained. The patient was then scanned while maintaining breath hold in the treatment position.  This image was then transferred over to the imaging specialist. The imaging specialist then created a fusion of the free breathing and breath hold scans using the chest wall as the stable structure. I personally reviewed the fusion in axial, coronal and sagittal image planes.  Excellent cardiac sparing was obtained.  I felt the patient is an appropriate candidate for breath hold and the patient will be treated as such.  The image fusion was then reviewed with the patient to reinforce the necessity of reproducible breath hold.    _______________________________   Jodelle Gross, MD, PhD

## 2017-05-29 ENCOUNTER — Inpatient Hospital Stay (HOSPITAL_COMMUNITY)
Admission: RE | Admit: 2017-05-29 | Discharge: 2017-05-29 | Disposition: A | Discharge status: Home / Self Care | Payer: Medicaid Other | Source: Ambulatory Visit | Attending: Internal Medicine | Admitting: Internal Medicine

## 2017-05-29 NOTE — Progress Notes (Signed)
Patient did not show up today for scheduled outpatient echo at [facility].

## 2017-05-30 DIAGNOSIS — Z510 Encounter for antineoplastic radiation therapy: Secondary | ICD-10-CM | POA: Diagnosis not present

## 2017-06-01 ENCOUNTER — Telehealth: Payer: Self-pay

## 2017-06-01 ENCOUNTER — Ambulatory Visit: Payer: Medicaid Other

## 2017-06-01 NOTE — Telephone Encounter (Signed)
Rescheduled pt ECHO for 02/28 at 1p at [facility].  Patient aware of date/time. Instructions of where to check in given. After ECHO she is to register and check in for radiation.  No further questions.  Cyndia Bent RN

## 2017-06-05 ENCOUNTER — Ambulatory Visit
Admission: RE | Admit: 2017-06-05 | Discharge: 2017-06-05 | Disposition: A | Discharge status: Home / Self Care | Payer: Medicaid Other | Source: Ambulatory Visit | Attending: Radiation Oncology | Admitting: Radiation Oncology

## 2017-06-05 DIAGNOSIS — Z510 Encounter for antineoplastic radiation therapy: Secondary | ICD-10-CM | POA: Diagnosis not present

## 2017-06-06 ENCOUNTER — Ambulatory Visit: Payer: Medicaid Other

## 2017-06-07 ENCOUNTER — Ambulatory Visit
Admission: RE | Admit: 2017-06-07 | Discharge: 2017-06-07 | Disposition: A | Discharge status: Home / Self Care | Payer: Medicaid Other | Source: Ambulatory Visit | Attending: Radiation Oncology | Admitting: Radiation Oncology

## 2017-06-07 ENCOUNTER — Ambulatory Visit: Payer: Medicaid Other

## 2017-06-07 DIAGNOSIS — Z510 Encounter for antineoplastic radiation therapy: Secondary | ICD-10-CM | POA: Diagnosis not present

## 2017-06-08 ENCOUNTER — Ambulatory Visit: Payer: Medicaid Other

## 2017-06-08 ENCOUNTER — Ambulatory Visit (HOSPITAL_COMMUNITY)
Admission: RE | Admit: 2017-06-08 | Discharge: 2017-06-08 | Disposition: A | Discharge status: Home / Self Care | Payer: Medicaid Other | Source: Ambulatory Visit | Attending: Internal Medicine | Admitting: Internal Medicine

## 2017-06-08 ENCOUNTER — Ambulatory Visit
Admission: RE | Admit: 2017-06-08 | Discharge: 2017-06-08 | Disposition: A | Discharge status: Home / Self Care | Payer: Medicaid Other | Source: Ambulatory Visit | Attending: Radiation Oncology | Admitting: Radiation Oncology

## 2017-06-08 DIAGNOSIS — C50412 Malignant neoplasm of upper-outer quadrant of left female breast: Secondary | ICD-10-CM | POA: Diagnosis not present

## 2017-06-08 DIAGNOSIS — F319 Bipolar disorder, unspecified: Secondary | ICD-10-CM | POA: Insufficient documentation

## 2017-06-08 DIAGNOSIS — Z510 Encounter for antineoplastic radiation therapy: Secondary | ICD-10-CM | POA: Diagnosis not present

## 2017-06-08 DIAGNOSIS — F4310 Post-traumatic stress disorder, unspecified: Secondary | ICD-10-CM | POA: Diagnosis not present

## 2017-06-08 DIAGNOSIS — Z170 Estrogen receptor positive status [ER+]: Secondary | ICD-10-CM | POA: Diagnosis not present

## 2017-06-08 NOTE — Progress Notes (Signed)
  Echocardiogram 2D Echocardiogram has been performed.  Diane Rosario 06/08/2017, 2:38 PM

## 2017-06-09 ENCOUNTER — Ambulatory Visit: Payer: Medicaid Other

## 2017-06-09 ENCOUNTER — Ambulatory Visit
Admission: RE | Admit: 2017-06-09 | Discharge: 2017-06-09 | Disposition: A | Discharge status: Home / Self Care | Payer: Medicaid Other | Source: Ambulatory Visit | Attending: Radiation Oncology | Admitting: Radiation Oncology

## 2017-06-09 DIAGNOSIS — C50412 Malignant neoplasm of upper-outer quadrant of left female breast: Secondary | ICD-10-CM | POA: Diagnosis not present

## 2017-06-09 DIAGNOSIS — Z170 Estrogen receptor positive status [ER+]: Secondary | ICD-10-CM | POA: Insufficient documentation

## 2017-06-09 DIAGNOSIS — Z510 Encounter for antineoplastic radiation therapy: Secondary | ICD-10-CM | POA: Diagnosis present

## 2017-06-09 MED ORDER — ALRA NON-METALLIC DEODORANT (RAD-ONC)
1.0000 "application " | Freq: Once | TOPICAL | Status: AC
  Administered 2017-06-09: 1 via TOPICAL

## 2017-06-09 MED ORDER — RADIAPLEXRX EX GEL
Freq: Two times a day (BID) | CUTANEOUS | Status: DC
  Administered 2017-06-09: 17:00:00 via TOPICAL

## 2017-06-09 MED ORDER — OXYCODONE-ACETAMINOPHEN 5-325 MG PO TABS: 1.0000 | ORAL_TABLET | ORAL | 0 refills | Status: DC | PRN

## 2017-06-09 NOTE — Progress Notes (Signed)

## 2017-06-12 ENCOUNTER — Ambulatory Visit: Payer: Medicaid Other

## 2017-06-13 ENCOUNTER — Ambulatory Visit: Payer: Medicaid Other

## 2017-06-14 ENCOUNTER — Inpatient Hospital Stay: Payer: Medicaid Other

## 2017-06-14 ENCOUNTER — Inpatient Hospital Stay: Payer: Medicaid Other | Attending: Hematology and Oncology | Admitting: Hematology and Oncology

## 2017-06-14 ENCOUNTER — Ambulatory Visit
Admission: RE | Admit: 2017-06-14 | Discharge: 2017-06-14 | Disposition: A | Discharge status: Home / Self Care | Payer: Medicaid Other | Source: Ambulatory Visit | Attending: Radiation Oncology | Admitting: Radiation Oncology

## 2017-06-14 DIAGNOSIS — F319 Bipolar disorder, unspecified: Secondary | ICD-10-CM | POA: Insufficient documentation

## 2017-06-14 DIAGNOSIS — Z170 Estrogen receptor positive status [ER+]: Secondary | ICD-10-CM | POA: Insufficient documentation

## 2017-06-14 DIAGNOSIS — C773 Secondary and unspecified malignant neoplasm of axilla and upper limb lymph nodes: Secondary | ICD-10-CM | POA: Diagnosis not present

## 2017-06-14 DIAGNOSIS — Z95828 Presence of other vascular implants and grafts: Secondary | ICD-10-CM

## 2017-06-14 DIAGNOSIS — R197 Diarrhea, unspecified: Secondary | ICD-10-CM

## 2017-06-14 DIAGNOSIS — C50412 Malignant neoplasm of upper-outer quadrant of left female breast: Secondary | ICD-10-CM

## 2017-06-14 DIAGNOSIS — C50812 Malignant neoplasm of overlapping sites of left female breast: Secondary | ICD-10-CM

## 2017-06-14 LAB — COMPREHENSIVE METABOLIC PANEL
ALT: 9 U/L (ref 0–55)
AST: 13 U/L (ref 5–34)
Albumin: 3.3 g/dL — ABNORMAL LOW (ref 3.5–5.0)
Alkaline Phosphatase: 58 U/L (ref 40–150)
Anion gap: 7 (ref 3–11)
BUN: 13 mg/dL (ref 7–26)
CO2: 30 mmol/L — ABNORMAL HIGH (ref 22–29)
Calcium: 9.5 mg/dL (ref 8.4–10.4)
Chloride: 106 mmol/L (ref 98–109)
Creatinine, Ser: 0.98 mg/dL (ref 0.60–1.10)
GFR calc Af Amer: >60 mL/min (ref >60)
GFR calc non Af Amer: >60 mL/min (ref >60)
Glucose, Bld: 88 mg/dL (ref 70–140)
Potassium: 3.6 mmol/L (ref 3.5–5.1)
Sodium: 143 mmol/L (ref 136–145)
Total Bilirubin: 0.2 mg/dL (ref 0.2–1.2)
Total Protein: 7.3 g/dL (ref 6.4–8.3)

## 2017-06-14 LAB — CBC WITH DIFFERENTIAL/PLATELET
Basophils Absolute: 0 10*3/uL (ref 0.0–0.1)
Basophils Relative: 1 %
Eosinophils Absolute: 0 10*3/uL (ref 0.0–0.5)
Eosinophils Relative: 1 %
HCT: 32.3 % — ABNORMAL LOW (ref 34.8–46.6)
Hemoglobin: 10.4 g/dL — ABNORMAL LOW (ref 11.6–15.9)
Lymphocytes Relative: 40 %
Lymphs Abs: 1.7 10*3/uL (ref 0.9–3.3)
MCH: 34.8 pg — ABNORMAL HIGH (ref 25.1–34.0)
MCHC: 32.2 g/dL (ref 31.5–36.0)
MCV: 108.1 fL — ABNORMAL HIGH (ref 79.5–101.0)
Monocytes Absolute: 0.3 10*3/uL (ref 0.1–0.9)
Monocytes Relative: 7 %
Neutro Abs: 2.2 10*3/uL (ref 1.5–6.5)
Neutrophils Relative %: 51 %
Platelets: 126 10*3/uL — ABNORMAL LOW (ref 145–400)
RBC: 2.99 MIL/uL — ABNORMAL LOW (ref 3.70–5.45)
RDW: 15.5 % — ABNORMAL HIGH (ref 11.2–14.5)
WBC: 4.2 10*3/uL (ref 3.9–10.3)

## 2017-06-14 MED ORDER — HEPARIN SOD (PORK) LOCK FLUSH 100 UNIT/ML IV SOLN
500.0000 [IU] | Freq: Once | INTRAVENOUS | Status: AC | PRN
  Administered 2017-06-14: 500 [IU] via INTRAVENOUS
  Filled 2017-06-14: qty 5

## 2017-06-14 MED ORDER — SODIUM CHLORIDE 0.9% FLUSH
10.0000 mL | INTRAVENOUS | Status: DC | PRN
  Administered 2017-06-14: 10 mL via INTRAVENOUS
  Filled 2017-06-14: qty 10

## 2017-06-14 NOTE — Progress Notes (Signed)
Patient Care Team: Department, [facility] as PCP - General [name], MD as Consulting Physician (General Surgery) Nicholas Lose, MD as Consulting Physician (Hematology and Oncology) Kyung Rudd, MD as Consulting Physician (Radiation Oncology)  DIAGNOSIS:  Encounter Diagnosis  Name Primary?  . Malignant neoplasm of upper-outer quadrant of left breast in female, estrogen receptor positive ([place])     SUMMARY OF ONCOLOGIC HISTORY:   Malignant neoplasm of upper-outer quadrant of left breast in female, estrogen receptor positive ([place])   11/01/2016 Initial Diagnosis    Palpable left breast mass with 4 enl axillary lymph nodes 4.8 x 0.8 x 3.1 cm; left axillary mass 1.1 x 0.5 cm, lymph node 1.4 x 0.8 cm biopsy grade 2-3 IDC ER 30% PR 0% HER-2 positive ratio 7.53, Ki-67 20%, T2 N2 MX stage IIIa clinical stage      12/02/2016 Genetic Testing    Negative genetic testing on the STAT panel.  The STAT Breast cancer panel offered by [facility] includes sequencing and rearrangement analysis for the following 9 genes:  ATM, BRCA1, BRCA2, CDH1, CHEK2, PALB2, PTEN, STK11 and TP53.   The report date is December 02, 2016.      12/14/2016 Surgery    Left modified radical mastectomy: IDC grade 3, spans 5 cm and a 2.2 cm with extensive DCIS grade 3, lymphovascular invasion present, margins negative, 17/17 lymph nodes positive, ER 30%, PR 0%, HER-2 positive, Ki-67 20%, T2 N3a M0 stage IIIB AJCC 8      01/10/2017 -  Chemotherapy    Adjuvant chemotherapy with TCH Perjeta 6 cycles followed by Herceptin Perjeta maintenance        CHIEF COMPLIANT: Herceptin and Perjeta maintenance therapy  INTERVAL HISTORY: DEARA BOBER is a 42 year old with above-mentioned history of left breast cancer who completed 6 cycles of [place].  She will resume Herceptin and Perjeta treatment starting this Friday.  The diarrhea appears to have improved.  She is currently undergoing adjuvant radiation  therapy.  REVIEW OF SYSTEMS:   Constitutional: Denies fevers, chills or abnormal weight loss Eyes: Denies blurriness of vision Ears, nose, mouth, throat, and face: Denies mucositis or sore throat Respiratory: Denies cough, dyspnea or wheezes Cardiovascular: Denies palpitation, chest discomfort Gastrointestinal: Nausea and diarrhea Skin: Denies abnormal skin rashes Lymphatics: Denies new lymphadenopathy or easy bruising Neurological:Denies numbness, tingling or new weaknesses Behavioral/Psych: Mood is stable, no new changes  Extremities: No lower extremity edema All other systems were reviewed with the patient and are negative.  I have reviewed the past medical history, past surgical history, social history and family history with the patient and they are unchanged from previous note.  ALLERGIES:  is allergic to asa [aspirin].  MEDICATIONS:  Current Outpatient Medications  Medication Sig Dispense Refill  . ALPRAZolam (XANAX) 0.5 MG tablet Take 0.5 mg by mouth 2 (two) times daily as needed for anxiety.     . ENSURE (ENSURE) Take 237 mLs by mouth daily.    Marland Kitchen lidocaine-prilocaine (EMLA) cream Apply to affected area once 30 g 3  . LORazepam (ATIVAN) 0.5 MG tablet Take 1 tablet (0.5 mg total) by mouth at bedtime. 30 tablet 0  . ondansetron (ZOFRAN) 8 MG tablet Take 1 tablet (8 mg total) by mouth 2 (two) times daily as needed for refractory nausea / vomiting. Start on day 3 after chemo. (Patient not taking: Reported on 05/18/2017) 30 tablet 1  . oxyCODONE-acetaminophen (PERCOCET/ROXICET) 5-325 MG tablet Take 1 tablet by mouth every 4 (four) hours as needed  for severe pain. 40 tablet 0  . prochlorperazine (COMPAZINE) 10 MG tablet Take 1 tablet (10 mg total) by mouth every 6 (six) hours as needed (Nausea or vomiting). (Patient not taking: Reported on 05/18/2017) 30 tablet 1  . risperiDONE microspheres (RISPERDAL CONSTA) 50 MG injection Inject 50 mg into the muscle every 14 (fourteen) days.     No  current facility-administered medications for this visit.    Facility-Administered Medications Ordered in Other Visits  Medication Dose Route Frequency Provider Last Rate Last Dose  . sodium chloride flush (NS) 0.9 % injection 10 mL  10 mL Intracatheter PRN Nicholas Lose, MD        PHYSICAL EXAMINATION: ECOG PERFORMANCE STATUS: 1 - Symptomatic but completely ambulatory  Vitals:   06/14/17 1406  BP: 112/73  Pulse: 88  Resp: 18  Temp: 98.2 F (36.8 C)  SpO2: 100%   Filed Weights   06/14/17 1406  Weight: 89 lb 11.2 oz (40.7 kg)    GENERAL:alert, no distress and comfortable SKIN: skin color, texture, turgor are normal, no rashes or significant lesions EYES: normal, Conjunctiva are pink and non-injected, sclera clear OROPHARYNX:no exudate, no erythema and lips, buccal mucosa, and tongue normal  NECK: supple, thyroid normal size, non-tender, without nodularity LYMPH:  no palpable lymphadenopathy in the cervical, axillary or inguinal LUNGS: clear to auscultation and percussion with normal breathing effort HEART: regular rate & rhythm and no murmurs and no lower extremity edema ABDOMEN:abdomen soft, non-tender and normal bowel sounds MUSCULOSKELETAL:no cyanosis of digits and no clubbing  NEURO: alert & oriented x 3 with fluent speech, no focal motor/sensory deficits EXTREMITIES: No lower extremity edema  LABORATORY DATA:  I have reviewed the data as listed CMP Latest Ref Rng & Units 05/05/2017 04/14/2017 03/21/2017  Glucose 70 - 140 mg/dL 120 80 77  BUN 7 - 26 mg/dL 14 15.5 14.7  Creatinine 0.60 - 1.10 mg/dL 0.84 1.0 0.9  Sodium 136 - 145 mmol/L 140 136 140  Potassium 3.3 - 4.7 mmol/L 3.8 4.3 3.8  Chloride 98 - 109 mmol/L 103 - -  CO2 22 - 29 mmol/L _0 Calcium 8.4 - 10.4 mg/dL 9.1 9.4 8.7  Total Protein 6.4 - 8.3 g/dL 7.1 7.5 6.3(L)  Total Bilirubin 0.2 - 1.2 mg/dL <0.2(L) 0.59 0.25  Alkaline Phos 40 - 150 U/L 64 74 56  AST 5 - 34 U/L 15 21 41(H)  ALT 0 - 55 U/L _1 Lab Results  Component Value Date   WBC 4.3 05/05/2017   HGB 8.4 (L) 05/05/2017   HCT 25.7 (L) 05/05/2017   MCV 105.2 (H) 05/05/2017   PLT 168 05/05/2017   NEUTROABS 2.0 05/05/2017    ASSESSMENT & PLAN:  Malignant neoplasm of upper-outer quadrant of left breast in female, estrogen receptor positive ([place]) 12/14/2016: Left modified radical mastectomy: IDC grade 3, spans 5 cm and a 2.2 cm with extensive DCIS grade 3, lymphovascular invasion present, margins negative, 17/17 lymph nodes positive, ER 30%, PR 0%, HER-2 positive, Ki-67 20%, T2 N3a M0 stage IIIB AJCC 8  Patient has long-standing issues with bipolar disorder and substance abuse previously. CT CAP and Bone Scan 11/17/2016: No metastases 11 mm left lower lobe groundglass nodule probably infectious etiology   Treatment plan:  1. Adjuvant chemotherapy with TCH Perjeta every 3 weeks 6 followed by Herceptin Perjeta maintenance for1 year 3. Followed by adjuvant radiation 4. Followed by adjuvant antiestrogen therapy 5-7 years ------------------------------------------------------------------------- Current treatment: Herceptin and  Perjeta Echocardiogram: Ejection fraction 50% on 12/06/2016  Currently undergoing adjuvant radiation Return to clinic in 3 weeks for every 3-week Herceptin and Perjeta maintenance for 1 year   I spent 25 minutes talking to the patient of which more than half was spent in counseling and coordination of care.  No orders of the defined types were placed in this encounter.  The patient has a good understanding of the overall plan. she agrees with it. she will call with any problems that may develop before the next visit here.   Harriette Ohara, MD 06/14/17

## 2017-06-14 NOTE — Assessment & Plan Note (Signed)
12/14/2016: Left modified radical mastectomy: IDC grade 3, spans 5 cm and a 2.2 cm with extensive DCIS grade 3, lymphovascular invasion present, margins negative, 17/17 lymph nodes positive, ER 30%, PR 0%, HER-2 positive, Ki-67 20%, T2 N3a M0 stage IIIB AJCC 8  Patient has long-standing issues with bipolar disorder and substance abuse previously. CT CAP and Bone Scan 11/17/2016: No metastases 11 mm left lower lobe groundglass nodule probably infectious etiology   Treatment plan:  1. Adjuvant chemotherapy with TCH Perjeta every 3 weeks 6 followed by Herceptin Perjeta maintenance for1 year 3. Followed by adjuvant radiation 4. Followed by adjuvant antiestrogen therapy 5-7 years ------------------------------------------------------------------------- Current treatment: [place] Perjeta Echocardiogram: Ejection fraction 50% on 12/06/2016  Chemotherapy toxicities: 1. Chemotherapy-induced nausea for 2 days 2. Perjeta induced diarrhea:Improved 3.Chemotherapy-induced anemia: Being monitored  Return to clinic in 3 weeks for every 3-week Herceptin and Perjeta maintenance for 1 year

## 2017-06-15 ENCOUNTER — Ambulatory Visit: Payer: Medicaid Other

## 2017-06-15 ENCOUNTER — Ambulatory Visit
Admission: RE | Admit: 2017-06-15 | Discharge: 2017-06-15 | Disposition: A | Discharge status: Home / Self Care | Payer: Medicaid Other | Source: Ambulatory Visit | Attending: Radiation Oncology | Admitting: Radiation Oncology

## 2017-06-15 DIAGNOSIS — C50412 Malignant neoplasm of upper-outer quadrant of left female breast: Secondary | ICD-10-CM | POA: Diagnosis not present

## 2017-06-16 ENCOUNTER — Ambulatory Visit: Payer: Medicaid Other

## 2017-06-19 ENCOUNTER — Ambulatory Visit
Admission: RE | Admit: 2017-06-19 | Discharge: 2017-06-19 | Disposition: A | Discharge status: Home / Self Care | Payer: Medicaid Other | Source: Ambulatory Visit | Attending: Radiation Oncology | Admitting: Radiation Oncology

## 2017-06-19 DIAGNOSIS — C50412 Malignant neoplasm of upper-outer quadrant of left female breast: Secondary | ICD-10-CM | POA: Diagnosis not present

## 2017-06-20 ENCOUNTER — Telehealth: Payer: Self-pay | Admitting: Hematology and Oncology

## 2017-06-20 ENCOUNTER — Telehealth: Payer: Self-pay | Admitting: *Deleted

## 2017-06-20 ENCOUNTER — Ambulatory Visit: Payer: Medicaid Other

## 2017-06-20 NOTE — Telephone Encounter (Signed)
Spoke to patient regarding upcoming march and April appointments.  °

## 2017-06-20 NOTE — Telephone Encounter (Signed)
Patients appointments in April are scheduled per provider availability.

## 2017-06-20 NOTE — Telephone Encounter (Signed)
Spoke with Mrs. Egelston regarding cancellation of her treatment appointments and she states she has a cold and will be at the next appointment.  Will follow as necessary.  Cori Razor, RN

## 2017-06-21 ENCOUNTER — Ambulatory Visit: Admission: RE | Admit: 2017-06-21 | Payer: Medicaid Other | Source: Ambulatory Visit

## 2017-06-22 ENCOUNTER — Ambulatory Visit
Admission: RE | Admit: 2017-06-22 | Discharge: 2017-06-22 | Disposition: A | Discharge status: Home / Self Care | Payer: Medicaid Other | Source: Ambulatory Visit | Attending: Radiation Oncology | Admitting: Radiation Oncology

## 2017-06-22 ENCOUNTER — Ambulatory Visit: Payer: Medicaid Other

## 2017-06-23 ENCOUNTER — Ambulatory Visit: Payer: Medicaid Other

## 2017-06-23 ENCOUNTER — Ambulatory Visit
Admission: RE | Admit: 2017-06-23 | Discharge: 2017-06-23 | Disposition: A | Discharge status: Home / Self Care | Payer: Medicaid Other | Source: Ambulatory Visit | Attending: Radiation Oncology | Admitting: Radiation Oncology

## 2017-06-23 DIAGNOSIS — C50412 Malignant neoplasm of upper-outer quadrant of left female breast: Secondary | ICD-10-CM | POA: Diagnosis not present

## 2017-06-26 ENCOUNTER — Ambulatory Visit: Admission: RE | Admit: 2017-06-26 | Payer: Medicaid Other | Source: Ambulatory Visit

## 2017-06-27 ENCOUNTER — Ambulatory Visit: Admission: RE | Admit: 2017-06-27 | Payer: Medicaid Other | Source: Ambulatory Visit

## 2017-06-28 ENCOUNTER — Ambulatory Visit: Payer: Medicaid Other

## 2017-06-29 ENCOUNTER — Telehealth: Payer: Self-pay | Admitting: *Deleted

## 2017-06-29 ENCOUNTER — Ambulatory Visit: Payer: Medicaid Other

## 2017-06-29 ENCOUNTER — Encounter: Payer: Self-pay | Admitting: General Practice

## 2017-06-29 NOTE — Telephone Encounter (Signed)
Spoke to her about cancellation of her radiation treatments this week.  She acknowledged the importance of her coming every day for treatment and she voiced concerns that " I don't know if I am going to keep having radiation because it is so hard for me to find a ride".  I let her know that there are resources available for her and we can get them to contact her. She stated the Dr. told her that she needed to be here Monday through Friday and she promised that she would be here tomorrow for treatment.  Left voicemail for Vernie Shanks and Lelon Frohlich in social work.  Will continue to follow as necessary.  Cori Razor, RN

## 2017-06-29 NOTE — Progress Notes (Signed)
[place] CSW Progress Notes  Referral received from [facility] RN/Radiation Clinic.  Patient has missed multiple appointments due to transportation issues.  CSW problem solved w patient.  She is eligible for Medicaid transport, which is free but must be scheduled in advance by calling [phone]. Patient will need to call her Medicaid worker Laurann Montana [phone]) to complete requalification interview. Patient will also be given a 31 day disability bus pass to assist w transport needs.  Patient will be given [employer] application to sign, CSW will submit and patient can ride [employer] for $1.60/ride.  Patient also referred to [facility]) for increased assistance in community setting.  Per patient, she also receives mental health care from [facility] (Dr Loman Brooklyn), used to have [employer] but no longer qualifies for this service as she has not had frequent behavioral health hospitalizations.    Edwyna Shell, LCSW Clinical Social Worker Phone:  [phone]

## 2017-06-30 ENCOUNTER — Ambulatory Visit: Payer: Medicaid Other

## 2017-06-30 ENCOUNTER — Encounter: Payer: Self-pay | Admitting: Radiation Oncology

## 2017-06-30 ENCOUNTER — Ambulatory Visit
Admission: RE | Admit: 2017-06-30 | Discharge: 2017-06-30 | Disposition: A | Discharge status: Home / Self Care | Payer: Medicaid Other | Source: Ambulatory Visit | Attending: Radiation Oncology | Admitting: Radiation Oncology

## 2017-06-30 DIAGNOSIS — C50412 Malignant neoplasm of upper-outer quadrant of left female breast: Secondary | ICD-10-CM | POA: Diagnosis not present

## 2017-06-30 NOTE — Progress Notes (Signed)
Diane Rosario came for her treatment today and I talked with her.  I gave her a 31 day bus pass and told her that we could still help with gas cards if she was getting rides as well.  I attempted to get her to sign the SCAT forms but she stated she did not want to at this time.  I encouraged her to stop by or call anytime to see myself or Diane Rosario if she needed help with transportation or had any questions.  Let me know if I can help with anything else.

## 2017-07-03 ENCOUNTER — Telehealth: Payer: Self-pay | Admitting: *Deleted

## 2017-07-03 ENCOUNTER — Ambulatory Visit: Admission: RE | Admit: 2017-07-03 | Payer: Medicaid Other | Source: Ambulatory Visit

## 2017-07-03 NOTE — Telephone Encounter (Signed)
Left voicemail requesting call back in regards to missed treatment appointment today.  Will continue to follow.  Cori Razor, RN

## 2017-07-04 ENCOUNTER — Ambulatory Visit: Payer: Medicaid Other | Admitting: Radiation Oncology

## 2017-07-04 ENCOUNTER — Ambulatory Visit
Admission: RE | Admit: 2017-07-04 | Discharge: 2017-07-04 | Disposition: A | Discharge status: Home / Self Care | Payer: Medicaid Other | Source: Ambulatory Visit | Attending: Radiation Oncology | Admitting: Radiation Oncology

## 2017-07-04 DIAGNOSIS — C50412 Malignant neoplasm of upper-outer quadrant of left female breast: Secondary | ICD-10-CM | POA: Diagnosis not present

## 2017-07-05 ENCOUNTER — Ambulatory Visit: Payer: Medicaid Other

## 2017-07-06 ENCOUNTER — Ambulatory Visit
Admission: RE | Admit: 2017-07-06 | Discharge: 2017-07-06 | Disposition: A | Discharge status: Home / Self Care | Payer: Medicaid Other | Source: Ambulatory Visit | Attending: Radiation Oncology | Admitting: Radiation Oncology

## 2017-07-06 ENCOUNTER — Ambulatory Visit: Payer: Medicaid Other

## 2017-07-06 DIAGNOSIS — C50412 Malignant neoplasm of upper-outer quadrant of left female breast: Secondary | ICD-10-CM | POA: Diagnosis not present

## 2017-07-07 ENCOUNTER — Ambulatory Visit: Payer: Medicaid Other

## 2017-07-07 ENCOUNTER — Other Ambulatory Visit: Payer: Medicaid Other

## 2017-07-10 ENCOUNTER — Ambulatory Visit: Payer: Medicaid Other

## 2017-07-10 DIAGNOSIS — Z510 Encounter for antineoplastic radiation therapy: Secondary | ICD-10-CM | POA: Insufficient documentation

## 2017-07-10 DIAGNOSIS — Z170 Estrogen receptor positive status [ER+]: Secondary | ICD-10-CM | POA: Insufficient documentation

## 2017-07-10 DIAGNOSIS — C50412 Malignant neoplasm of upper-outer quadrant of left female breast: Secondary | ICD-10-CM | POA: Insufficient documentation

## 2017-07-11 ENCOUNTER — Ambulatory Visit
Admission: RE | Admit: 2017-07-11 | Discharge: 2017-07-11 | Disposition: A | Discharge status: Home / Self Care | Payer: Medicaid Other | Source: Ambulatory Visit | Attending: Radiation Oncology | Admitting: Radiation Oncology

## 2017-07-11 ENCOUNTER — Ambulatory Visit: Payer: Medicaid Other | Admitting: Radiation Oncology

## 2017-07-11 DIAGNOSIS — C50412 Malignant neoplasm of upper-outer quadrant of left female breast: Secondary | ICD-10-CM | POA: Diagnosis not present

## 2017-07-11 DIAGNOSIS — Z170 Estrogen receptor positive status [ER+]: Secondary | ICD-10-CM | POA: Diagnosis not present

## 2017-07-11 DIAGNOSIS — Z510 Encounter for antineoplastic radiation therapy: Secondary | ICD-10-CM | POA: Diagnosis present

## 2017-07-12 ENCOUNTER — Ambulatory Visit: Payer: Medicaid Other

## 2017-07-13 ENCOUNTER — Ambulatory Visit: Payer: Medicaid Other

## 2017-07-13 ENCOUNTER — Ambulatory Visit
Admission: RE | Admit: 2017-07-13 | Discharge: 2017-07-13 | Disposition: A | Discharge status: Home / Self Care | Payer: Medicaid Other | Source: Ambulatory Visit | Attending: Radiation Oncology | Admitting: Radiation Oncology

## 2017-07-13 DIAGNOSIS — Z510 Encounter for antineoplastic radiation therapy: Secondary | ICD-10-CM | POA: Diagnosis not present

## 2017-07-14 ENCOUNTER — Ambulatory Visit: Payer: Medicaid Other

## 2017-07-14 ENCOUNTER — Ambulatory Visit
Admission: RE | Admit: 2017-07-14 | Discharge: 2017-07-14 | Disposition: A | Discharge status: Home / Self Care | Payer: Medicaid Other | Source: Ambulatory Visit | Attending: Radiation Oncology | Admitting: Radiation Oncology

## 2017-07-14 DIAGNOSIS — Z510 Encounter for antineoplastic radiation therapy: Secondary | ICD-10-CM | POA: Diagnosis not present

## 2017-07-17 ENCOUNTER — Ambulatory Visit: Payer: Medicaid Other

## 2017-07-17 ENCOUNTER — Ambulatory Visit
Admission: RE | Admit: 2017-07-17 | Discharge: 2017-07-17 | Disposition: A | Discharge status: Home / Self Care | Payer: Medicaid Other | Source: Ambulatory Visit | Attending: Radiation Oncology | Admitting: Radiation Oncology

## 2017-07-17 DIAGNOSIS — Z510 Encounter for antineoplastic radiation therapy: Secondary | ICD-10-CM | POA: Diagnosis not present

## 2017-07-18 ENCOUNTER — Ambulatory Visit: Payer: Medicaid Other

## 2017-07-18 ENCOUNTER — Ambulatory Visit: Payer: Medicaid Other | Admitting: Radiation Oncology

## 2017-07-19 ENCOUNTER — Ambulatory Visit: Payer: Medicaid Other | Admitting: Radiation Oncology

## 2017-07-19 ENCOUNTER — Ambulatory Visit: Payer: Medicaid Other

## 2017-07-20 ENCOUNTER — Ambulatory Visit
Admission: RE | Admit: 2017-07-20 | Discharge: 2017-07-20 | Disposition: A | Discharge status: Home / Self Care | Payer: Medicaid Other | Source: Ambulatory Visit | Attending: Radiation Oncology | Admitting: Radiation Oncology

## 2017-07-20 ENCOUNTER — Ambulatory Visit: Payer: Medicaid Other

## 2017-07-20 DIAGNOSIS — Z510 Encounter for antineoplastic radiation therapy: Secondary | ICD-10-CM | POA: Diagnosis not present

## 2017-07-21 ENCOUNTER — Ambulatory Visit: Payer: Medicaid Other

## 2017-07-24 ENCOUNTER — Ambulatory Visit
Admission: RE | Admit: 2017-07-24 | Discharge: 2017-07-24 | Disposition: A | Discharge status: Home / Self Care | Payer: Medicaid Other | Source: Ambulatory Visit | Attending: Radiation Oncology | Admitting: Radiation Oncology

## 2017-07-24 ENCOUNTER — Ambulatory Visit: Payer: Medicaid Other

## 2017-07-24 DIAGNOSIS — Z510 Encounter for antineoplastic radiation therapy: Secondary | ICD-10-CM | POA: Diagnosis not present

## 2017-07-25 ENCOUNTER — Ambulatory Visit: Payer: Medicaid Other | Admitting: Radiation Oncology

## 2017-07-25 ENCOUNTER — Ambulatory Visit: Payer: Medicaid Other

## 2017-07-25 ENCOUNTER — Other Ambulatory Visit: Payer: Medicaid Other

## 2017-07-25 ENCOUNTER — Ambulatory Visit: Payer: Medicaid Other | Admitting: Hematology and Oncology

## 2017-07-25 DIAGNOSIS — Z510 Encounter for antineoplastic radiation therapy: Secondary | ICD-10-CM | POA: Diagnosis not present

## 2017-07-25 NOTE — Assessment & Plan Note (Deleted)
12/14/2016: Left modified radical mastectomy: IDC grade 3, spans 5 cm and a 2.2 cm with extensive DCIS grade 3, lymphovascular invasion present, margins negative, 17/17 lymph nodes positive, ER 30%, PR 0%, HER-2 positive, Ki-67 20%, T2 N3a M0 stage IIIB AJCC 8  Patient has long-standing issues with bipolar disorder and substance abuse previously. CT CAP and Bone Scan 11/17/2016: No metastases 11 mm left lower lobe groundglass nodule probably infectious etiology   Treatment plan:  1. Adjuvant chemotherapy with TCH Perjeta every 3 weeks 6 followed by Herceptin Perjeta maintenance for1 year 3. Followed by adjuvant radiation 4. Followed by adjuvant antiestrogen therapy 5-7 years ------------------------------------------------------------------------- Current treatment: Herceptin and Perjeta Echocardiogram: Ejection fraction 50% on 12/06/2016  Adjuvant radiation: 06/07/2017 to 07/25/2017 Monitoring closely for Herceptin and Perjeta toxicities.  Once radiation is completed she will start antiestrogen therapy.  We discussed the role of ovarian suppression along with antiestrogen therapy. For the time being she will start on tamoxifen therapy 08/09/2017  Tamoxifen counseling: We discussed the risks and benefits of tamoxifen. These include but not limited to insomnia, hot flashes, mood changes, vaginal dryness, and weight gain. Although rare, serious side effects including endometrial cancer, risk of blood clots were also discussed. We strongly believe that the benefits far outweigh the risks. Patient understands these risks and consented to starting treatment. Planned treatment duration is 10 years.   Return to clinic in 3 weeks for every 3-week Herceptin and Perjeta maintenance for 1 year

## 2017-07-25 NOTE — Progress Notes (Deleted)
Patient Care Team: Department, [facility] as PCP - General Diane Skates, MD as Consulting Physician (General Surgery) Diane Lose, MD as Consulting Physician (Hematology and Oncology) Diane Rudd, MD as Consulting Physician (Radiation Oncology)  DIAGNOSIS:  Encounter Diagnosis  Name Primary?  . Malignant neoplasm of upper-outer quadrant of left breast in female, estrogen receptor positive ([place])     SUMMARY OF ONCOLOGIC HISTORY:   Malignant neoplasm of upper-outer quadrant of left breast in female, estrogen receptor positive ([place])   11/01/2016 Initial Diagnosis    Palpable left breast mass with 4 enl axillary lymph nodes 4.8 x 0.8 x 3.1 cm; left axillary mass 1.1 x 0.5 cm, lymph node 1.4 x 0.8 cm biopsy grade 2-3 IDC ER 30% PR 0% HER-2 positive ratio 7.53, Ki-67 20%, T2 N2 MX stage IIIa clinical stage      12/02/2016 Genetic Testing    Negative genetic testing on the STAT panel.  The STAT Breast cancer panel offered by [facility] includes sequencing and rearrangement analysis for the following 9 genes:  ATM, BRCA1, BRCA2, CDH1, CHEK2, PALB2, PTEN, STK11 and TP53.   The report date is December 02, 2016.      12/14/2016 Surgery    Left modified radical mastectomy: IDC grade 3, spans 5 cm and a 2.2 cm with extensive DCIS grade 3, lymphovascular invasion present, margins negative, 17/17 lymph nodes positive, ER 30%, PR 0%, HER-2 positive, Ki-67 20%, T2 N3a M0 stage IIIB AJCC 8      01/10/2017 -  Chemotherapy    Adjuvant chemotherapy with TCH Perjeta 6 cycles followed by Herceptin Perjeta maintenance       06/07/2017 - 07/25/2017 Radiation Therapy    Adjuvant radiation therapy       CHIEF COMPLIANT: Follow-up at the end of radiation, currently on Herceptin and Perjeta maintenance  INTERVAL HISTORY: Diane Rosario is a 42 year old with above-mentioned history left breast cancer treated with mastectomy and is currently on Herceptin and Perjeta maintenance appears to be  tolerating that it very well.  She is also receiving adjuvant radiation therapy.  She does have radiation dermatitis but otherwise doing quite well.  Denies any diarrhea.  Does have fatigue.  REVIEW OF SYSTEMS:   Constitutional: Denies fevers, chills or abnormal weight loss Eyes: Denies blurriness of vision Ears, nose, mouth, throat, and face: Denies mucositis or sore throat Respiratory: Denies cough, dyspnea or wheezes Cardiovascular: Denies palpitation, chest discomfort Gastrointestinal:  Denies nausea, heartburn or change in bowel habits Skin: Denies abnormal skin rashes Lymphatics: Denies new lymphadenopathy or easy bruising Neurological:Denies numbness, tingling or new weaknesses Behavioral/Psych: Mood is stable, no new changes  Extremities: No lower extremity edema Breast: Radiation dermatitis All other systems were reviewed with the patient and are negative.  I have reviewed the past medical history, past surgical history, social history and family history with the patient and they are unchanged from previous note.  ALLERGIES:  is allergic to asa [aspirin].  MEDICATIONS:  Current Outpatient Medications  Medication Sig Dispense Refill  . ALPRAZolam (XANAX) 0.5 MG tablet Take 0.5 mg by mouth 2 (two) times daily as needed for anxiety.     . ENSURE (ENSURE) Take 237 mLs by mouth daily.    Marland Kitchen lidocaine-prilocaine (EMLA) cream Apply to affected area once 30 g 3  . LORazepam (ATIVAN) 0.5 MG tablet Take 1 tablet (0.5 mg total) by mouth at bedtime. 30 tablet 0  . ondansetron (ZOFRAN) 8 MG tablet Take 1 tablet (8 mg total) by mouth  2 (two) times daily as needed for refractory nausea / vomiting. Start on day 3 after chemo. (Patient not taking: Reported on 05/18/2017) 30 tablet 1  . oxyCODONE-acetaminophen (PERCOCET/ROXICET) 5-325 MG tablet Take 1 tablet by mouth every 4 (four) hours as needed for severe pain. 40 tablet 0  . prochlorperazine (COMPAZINE) 10 MG tablet Take 1 tablet (10 mg total)  by mouth every 6 (six) hours as needed (Nausea or vomiting). (Patient not taking: Reported on 05/18/2017) 30 tablet 1  . risperiDONE microspheres (RISPERDAL CONSTA) 50 MG injection Inject 50 mg into the muscle every 14 (fourteen) days.     No current facility-administered medications for this visit.    Facility-Administered Medications Ordered in Other Visits  Medication Dose Route Frequency Provider Last Rate Last Dose  . sodium chloride flush (NS) 0.9 % injection 10 mL  10 mL Intracatheter PRN Diane Lose, MD        PHYSICAL EXAMINATION: ECOG PERFORMANCE STATUS: 1 - Symptomatic but completely ambulatory  There were no vitals filed for this visit. There were no vitals filed for this visit.  GENERAL:alert, no distress and comfortable SKIN: skin color, texture, turgor are normal, no rashes or significant lesions EYES: normal, Conjunctiva are pink and non-injected, sclera clear OROPHARYNX:no exudate, no erythema and lips, buccal mucosa, and tongue normal  NECK: supple, thyroid normal size, non-tender, without nodularity LYMPH:  no palpable lymphadenopathy in the cervical, axillary or inguinal LUNGS: clear to auscultation and percussion with normal breathing effort HEART: regular rate & rhythm and no murmurs and no lower extremity edema ABDOMEN:abdomen soft, non-tender and normal bowel sounds MUSCULOSKELETAL:no cyanosis of digits and no clubbing  NEURO: alert & oriented x 3 with fluent speech, no focal motor/sensory deficits EXTREMITIES: No lower extremity edema  LABORATORY DATA:  I have reviewed the data as listed CMP Latest Ref Rng & Units 06/14/2017 05/05/2017 04/14/2017  Glucose 70 - 140 mg/dL 88 120 80  BUN 7 - 26 mg/dL 13 14 15.5  Creatinine 0.60 - 1.10 mg/dL 0.98 0.84 1.0  Sodium 136 - 145 mmol/L 143 140 136  Potassium 3.5 - 5.1 mmol/L 3.6 3.8 4.3  Chloride 98 - 109 mmol/L 106 103 -  CO2 22 - 29 mmol/L 30(H) 29 28  Calcium 8.4 - 10.4 mg/dL 9.5 9.1 9.4  Total Protein 6.4 - 8.3  g/dL 7.3 7.1 7.5  Total Bilirubin 0.2 - 1.2 mg/dL 0.2 <0.2(L) 0.59  Alkaline Phos 40 - 150 U/L 58 64 74  AST 5 - 34 U/L '13 15 21  '$ ALT 0 - 55 U/L '9 5 12    '$ Lab Results  Component Value Date   WBC 4.2 06/14/2017   HGB 10.4 (L) 06/14/2017   HCT 32.3 (L) 06/14/2017   MCV 108.1 (H) 06/14/2017   PLT 126 (L) 06/14/2017   NEUTROABS 2.2 06/14/2017    ASSESSMENT & PLAN:  Malignant neoplasm of upper-outer quadrant of left breast in female, estrogen receptor positive ([place]) 12/14/2016: Left modified radical mastectomy: IDC grade 3, spans 5 cm and a 2.2 cm with extensive DCIS grade 3, lymphovascular invasion present, margins negative, 17/17 lymph nodes positive, ER 30%, PR 0%, HER-2 positive, Ki-67 20%, T2 N3a M0 stage IIIB AJCC 8  Patient has long-standing issues with bipolar disorder and substance abuse previously. CT CAP and Bone Scan 11/17/2016: No metastases 11 mm left lower lobe groundglass nodule probably infectious etiology   Treatment plan:  1. Adjuvant chemotherapy with Ritchey Perjeta every 3 weeks 6 followed by Herceptin Perjeta maintenance  for1 year 3. Followed by adjuvant radiation 4. Followed by adjuvant antiestrogen therapy 5-7 years ------------------------------------------------------------------------- Current treatment: Herceptin and Perjeta Echocardiogram: Ejection fraction 50% on 12/06/2016  Adjuvant radiation: 06/07/2017 to 07/25/2017 Monitoring closely for Herceptin and Perjeta toxicities.  Once radiation is completed she will start antiestrogen therapy.  We discussed the role of ovarian suppression along with antiestrogen therapy. For the time being she will start on tamoxifen therapy 08/09/2017  Tamoxifen counseling: We discussed the risks and benefits of tamoxifen. These include but not limited to insomnia, hot flashes, mood changes, vaginal dryness, and weight gain. Although rare, serious side effects including endometrial cancer, risk of blood clots were also  discussed. We strongly believe that the benefits far outweigh the risks. Patient understands these risks and consented to starting treatment. Planned treatment duration is 10 years.   Return to clinic in 3 weeks for every 3-week Herceptin and Perjeta maintenance for 1 year      No orders of the defined types were placed in this encounter.  The patient has a good understanding of the overall plan. she agrees with it. she will call with any problems that may develop before the next visit here.   Harriette Ohara, MD 07/25/17

## 2017-07-26 ENCOUNTER — Ambulatory Visit: Payer: Medicaid Other

## 2017-07-26 ENCOUNTER — Ambulatory Visit: Payer: Medicaid Other | Admitting: Radiation Oncology

## 2017-07-26 ENCOUNTER — Ambulatory Visit
Admission: RE | Admit: 2017-07-26 | Discharge: 2017-07-26 | Disposition: A | Discharge status: Home / Self Care | Payer: Medicaid Other | Source: Ambulatory Visit | Attending: Radiation Oncology | Admitting: Radiation Oncology

## 2017-07-26 DIAGNOSIS — Z510 Encounter for antineoplastic radiation therapy: Secondary | ICD-10-CM | POA: Diagnosis not present

## 2017-07-26 MED ORDER — OXYCODONE-ACETAMINOPHEN 5-325 MG PO TABS: 1.0000 | ORAL_TABLET | ORAL | 0 refills | Status: DC | PRN

## 2017-07-26 NOTE — Progress Notes (Signed)
Patient came around following her radiation treatment to have her oxycodone prescription refilled.  Dr. Lisbeth Renshaw refilled and sent electronically to [facility] on [address].  Will continue to follow as necessary.  Cori Razor, RN

## 2017-07-27 ENCOUNTER — Ambulatory Visit: Payer: Medicaid Other | Admitting: Radiation Oncology

## 2017-07-27 ENCOUNTER — Ambulatory Visit: Payer: Medicaid Other

## 2017-07-27 ENCOUNTER — Ambulatory Visit
Admission: RE | Admit: 2017-07-27 | Discharge: 2017-07-27 | Disposition: A | Discharge status: Home / Self Care | Payer: Medicaid Other | Source: Ambulatory Visit | Attending: Radiation Oncology | Admitting: Radiation Oncology

## 2017-07-27 DIAGNOSIS — Z510 Encounter for antineoplastic radiation therapy: Secondary | ICD-10-CM | POA: Diagnosis not present

## 2017-07-28 ENCOUNTER — Ambulatory Visit: Payer: Medicaid Other

## 2017-07-28 ENCOUNTER — Ambulatory Visit: Payer: Medicaid Other | Admitting: Radiation Oncology

## 2017-07-28 ENCOUNTER — Telehealth: Payer: Self-pay

## 2017-07-28 ENCOUNTER — Inpatient Hospital Stay: Payer: Medicaid Other | Attending: Hematology and Oncology

## 2017-07-28 NOTE — Telephone Encounter (Signed)
LVM for patient regarding missed infusion appointment today. Left call back number.  Cyndia Bent RN

## 2017-07-31 ENCOUNTER — Ambulatory Visit: Payer: Medicaid Other

## 2017-08-01 ENCOUNTER — Ambulatory Visit: Payer: Medicaid Other

## 2017-08-01 ENCOUNTER — Ambulatory Visit
Admission: RE | Admit: 2017-08-01 | Discharge: 2017-08-01 | Disposition: A | Discharge status: Home / Self Care | Payer: Medicaid Other | Source: Ambulatory Visit | Attending: Radiation Oncology | Admitting: Radiation Oncology

## 2017-08-01 DIAGNOSIS — Z510 Encounter for antineoplastic radiation therapy: Secondary | ICD-10-CM | POA: Diagnosis not present

## 2017-08-02 ENCOUNTER — Ambulatory Visit: Payer: Medicaid Other

## 2017-08-03 ENCOUNTER — Ambulatory Visit: Payer: Medicaid Other

## 2017-08-04 ENCOUNTER — Ambulatory Visit: Payer: Medicaid Other

## 2017-08-04 ENCOUNTER — Ambulatory Visit: Payer: Medicaid Other | Admitting: Radiation Oncology

## 2017-08-07 ENCOUNTER — Ambulatory Visit: Payer: Medicaid Other

## 2017-08-08 ENCOUNTER — Ambulatory Visit: Payer: Medicaid Other

## 2017-08-09 ENCOUNTER — Ambulatory Visit: Payer: Medicaid Other

## 2017-08-09 ENCOUNTER — Ambulatory Visit: Payer: Medicaid Other | Attending: Radiation Oncology

## 2017-08-10 ENCOUNTER — Ambulatory Visit: Payer: Medicaid Other

## 2017-08-11 ENCOUNTER — Ambulatory Visit: Payer: Medicaid Other | Admitting: Radiation Oncology

## 2017-08-11 ENCOUNTER — Ambulatory Visit: Payer: Medicaid Other

## 2017-08-12 ENCOUNTER — Ambulatory Visit: Payer: Medicaid Other

## 2017-08-14 ENCOUNTER — Ambulatory Visit: Payer: Medicaid Other

## 2017-08-14 ENCOUNTER — Other Ambulatory Visit: Payer: Self-pay

## 2017-08-14 MED ORDER — OXYCODONE-ACETAMINOPHEN 5-325 MG PO TABS: 1.0000 | ORAL_TABLET | ORAL | 0 refills | Status: DC | PRN

## 2017-08-14 NOTE — Telephone Encounter (Signed)
Pt came in for her radiation today and had stopped by the breast center lobby. She would like to get a refill for her percocet. Per Dr.Gudena, okay to refill. Gave pt refill script.

## 2017-08-15 ENCOUNTER — Ambulatory Visit: Payer: Medicaid Other

## 2017-08-15 ENCOUNTER — Encounter: Payer: Self-pay | Admitting: Radiation Oncology

## 2017-08-16 ENCOUNTER — Ambulatory Visit: Payer: Medicaid Other

## 2017-08-17 ENCOUNTER — Ambulatory Visit: Payer: Medicaid Other

## 2017-08-18 ENCOUNTER — Inpatient Hospital Stay: Payer: Medicaid Other

## 2017-08-18 ENCOUNTER — Ambulatory Visit: Payer: Medicaid Other

## 2017-08-18 ENCOUNTER — Inpatient Hospital Stay: Payer: Medicaid Other | Attending: Hematology and Oncology

## 2017-08-18 ENCOUNTER — Telehealth: Payer: Self-pay | Admitting: *Deleted

## 2017-08-18 ENCOUNTER — Ambulatory Visit: Payer: Medicaid Other | Admitting: Radiation Oncology

## 2017-08-18 DIAGNOSIS — C773 Secondary and unspecified malignant neoplasm of axilla and upper limb lymph nodes: Secondary | ICD-10-CM | POA: Insufficient documentation

## 2017-08-18 DIAGNOSIS — C50812 Malignant neoplasm of overlapping sites of left female breast: Secondary | ICD-10-CM

## 2017-08-18 DIAGNOSIS — Z170 Estrogen receptor positive status [ER+]: Secondary | ICD-10-CM | POA: Diagnosis not present

## 2017-08-18 DIAGNOSIS — Z95828 Presence of other vascular implants and grafts: Secondary | ICD-10-CM

## 2017-08-18 DIAGNOSIS — C50412 Malignant neoplasm of upper-outer quadrant of left female breast: Secondary | ICD-10-CM | POA: Diagnosis present

## 2017-08-18 LAB — CBC WITH DIFFERENTIAL/PLATELET
Basophils Absolute: 0 10*3/uL (ref 0.0–0.1)
Basophils Relative: 1 %
Eosinophils Absolute: 0.1 10*3/uL (ref 0.0–0.5)
Eosinophils Relative: 3 %
HCT: 32.7 % — ABNORMAL LOW (ref 34.8–46.6)
Hemoglobin: 10.8 g/dL — ABNORMAL LOW (ref 11.6–15.9)
Lymphocytes Relative: 48 %
Lymphs Abs: 1.5 10*3/uL (ref 0.9–3.3)
MCH: 34.6 pg — ABNORMAL HIGH (ref 25.1–34.0)
MCHC: 33.2 g/dL (ref 31.5–36.0)
MCV: 104.4 fL — ABNORMAL HIGH (ref 79.5–101.0)
Monocytes Absolute: 0.3 10*3/uL (ref 0.1–0.9)
Monocytes Relative: 10 %
Neutro Abs: 1.2 10*3/uL — ABNORMAL LOW (ref 1.5–6.5)
Neutrophils Relative %: 38 %
Platelets: 236 10*3/uL (ref 145–400)
RBC: 3.13 MIL/uL — ABNORMAL LOW (ref 3.70–5.45)
RDW: 13 % (ref 11.2–14.5)
WBC: 3.2 10*3/uL — ABNORMAL LOW (ref 3.9–10.3)

## 2017-08-18 LAB — COMPREHENSIVE METABOLIC PANEL
ALT: 11 U/L (ref 0–55)
AST: 17 U/L (ref 5–34)
Albumin: 3.6 g/dL (ref 3.5–5.0)
Alkaline Phosphatase: 59 U/L (ref 40–150)
Anion gap: 5 (ref 3–11)
BUN: 14 mg/dL (ref 7–26)
CO2: 29 mmol/L (ref 22–29)
Calcium: 9.2 mg/dL (ref 8.4–10.4)
Chloride: 105 mmol/L (ref 98–109)
Creatinine, Ser: 1.04 mg/dL (ref 0.60–1.10)
GFR calc Af Amer: >60 mL/min (ref >60)
GFR calc non Af Amer: >60 mL/min (ref >60)
Glucose, Bld: 95 mg/dL (ref 70–140)
Potassium: 3.7 mmol/L (ref 3.5–5.1)
Sodium: 139 mmol/L (ref 136–145)
Total Bilirubin: 0.3 mg/dL (ref 0.2–1.2)
Total Protein: 7 g/dL (ref 6.4–8.3)

## 2017-08-18 MED ORDER — SODIUM CHLORIDE 0.9% FLUSH
10.0000 mL | INTRAVENOUS | Status: DC | PRN
  Administered 2017-08-18: 10 mL via INTRAVENOUS
  Filled 2017-08-18: qty 10

## 2017-08-18 MED ORDER — HEPARIN SOD (PORK) LOCK FLUSH 100 UNIT/ML IV SOLN
500.0000 [IU] | Freq: Once | INTRAVENOUS | Status: AC | PRN
  Administered 2017-08-18: 500 [IU] via INTRAVENOUS
  Filled 2017-08-18: qty 5

## 2017-08-18 NOTE — Telephone Encounter (Signed)
This RN was contacted by the treatment room nurse stating pt is here but " her orders are dated in Feb of this year " .  Pt is scheduled for perjeta and herceptin.  Per chart review noted pt had chemo with herceptin / perjeta - completed in January 2019 - she was to continue the herceptin/perjeta for 1 year.  Per last MD note pt had diarrhea which held her treatment in Feb 2019.  She cancelled her appointment on 3/12 due to " have a cold " and then " forgot I had a treatment for April "  Nichole stated " I thought I finished my chemo because I rang the bell in January "  Noted concern in review with pharmacy is pt will need to be reloaded due to absence of therapy. Per review with covering MD recommendation given due to absence of therapy and need for MD assessment and further implement ion of therapy best to schedule pt for follow up visit.  This RN spoke directly to the patient in the chemo room- reviewed the above - with Latese still thinking " I really don't know what I am supposed to be getting because I thought I finished my treatments in January"  This RN validated her statement and informed her of appointment time for this coming Monday as well appointment given in writing.  No further needs at this time.

## 2017-08-21 ENCOUNTER — Ambulatory Visit: Payer: Medicaid Other

## 2017-08-21 ENCOUNTER — Inpatient Hospital Stay (HOSPITAL_BASED_OUTPATIENT_CLINIC_OR_DEPARTMENT_OTHER): Payer: Medicaid Other | Admitting: Hematology and Oncology

## 2017-08-21 DIAGNOSIS — Z170 Estrogen receptor positive status [ER+]: Secondary | ICD-10-CM | POA: Diagnosis not present

## 2017-08-21 DIAGNOSIS — C773 Secondary and unspecified malignant neoplasm of axilla and upper limb lymph nodes: Secondary | ICD-10-CM

## 2017-08-21 DIAGNOSIS — C50412 Malignant neoplasm of upper-outer quadrant of left female breast: Secondary | ICD-10-CM

## 2017-08-21 MED ORDER — TAMOXIFEN CITRATE 10 MG PO TABS: 10.0000 mg | ORAL_TABLET | Freq: Two times a day (BID) | ORAL | 3 refills | Status: DC

## 2017-08-21 NOTE — Assessment & Plan Note (Addendum)
12/14/2016: Left modified radical mastectomy: IDC grade 3, spans 5 cm and a 2.2 cm with extensive DCIS grade 3, lymphovascular invasion present, margins negative, 17/17 lymph nodes positive, ER 30%, PR 0%, HER-2 positive, Ki-67 20%, T2 N3a M0 stage IIIB AJCC 8  Patient has long-standing issues with bipolar disorder and substance abuse previously. CT CAP and Bone Scan 11/17/2016: No metastases 11 mm left lower lobe groundglass nodule probably infectious etiology   Treatment plan:  1. Adjuvant chemotherapy with TCH Perjeta every 3 weeks 6 followed by Herceptin Perjeta maintenance for1 year 3. Followed by adjuvant radiation 06/07/2017-08/01/2017 4. Followed by adjuvant antiestrogen therapy 5-7 years started 08/21/2017 ------------------------------------------------------------------------- Current treatment: Herceptin and Perjeta Echocardiogram: Ejection fraction 50% on 12/06/2016  Since he completed adjuvant radiation therapy, I recommended starting her on tamoxifen 20 mg daily. We discussed the risks and benefits of tamoxifen. These include but not limited to insomnia, hot flashes, mood changes, vaginal dryness, and weight gain. Although rare, serious side effects including endometrial cancer, risk of blood clots were also discussed. We strongly believe that the benefits far outweigh the risks. Patient understands these risks and consented to starting treatment. Planned treatment duration is 10 years.  Return to clinic in 3 weeks for every 3-week Herceptin and Perjeta maintenance and now on tamoxifen.

## 2017-08-21 NOTE — Progress Notes (Signed)
Patient Care Team: Department, [facility] as PCP - General [name], MD as Consulting Physician (General Surgery) Nicholas Lose, MD as Consulting Physician (Hematology and Oncology) Kyung Rudd, MD as Consulting Physician (Radiation Oncology)  DIAGNOSIS:  Encounter Diagnosis  Name Primary?  . Malignant neoplasm of upper-outer quadrant of left breast in female, estrogen receptor positive ([place])     SUMMARY OF ONCOLOGIC HISTORY:   Malignant neoplasm of upper-outer quadrant of left breast in female, estrogen receptor positive ([place])   11/01/2016 Initial Diagnosis    Palpable left breast mass with 4 enl axillary lymph nodes 4.8 x 0.8 x 3.1 cm; left axillary mass 1.1 x 0.5 cm, lymph node 1.4 x 0.8 cm biopsy grade 2-3 IDC ER 30% PR 0% HER-2 positive ratio 7.53, Ki-67 20%, T2 N2 MX stage IIIa clinical stage      12/02/2016 Genetic Testing    Negative genetic testing on the STAT panel.  The STAT Breast cancer panel offered by [facility] includes sequencing and rearrangement analysis for the following 9 genes:  ATM, BRCA1, BRCA2, CDH1, CHEK2, PALB2, PTEN, STK11 and TP53.   The report date is December 02, 2016.      12/14/2016 Surgery    Left modified radical mastectomy: IDC grade 3, spans 5 cm and a 2.2 cm with extensive DCIS grade 3, lymphovascular invasion present, margins negative, 17/17 lymph nodes positive, ER 30%, PR 0%, HER-2 positive, Ki-67 20%, T2 N3a M0 stage IIIB AJCC 8      01/10/2017 -  Chemotherapy    Adjuvant chemotherapy with TCH Perjeta 6 cycles followed by Herceptin Perjeta maintenance       06/07/2017 - 07/25/2017 Radiation Therapy    Adjuvant radiation therapy       CHIEF COMPLIANT: Follow-up to restart Herceptin and Perjeta  INTERVAL HISTORY: Diane Rosario is a 42 year old with above-mentioned history of left breast cancer treated with mastectomy followed by adjuvant chemotherapy and radiation therapy.  She is currently on Herceptin and Perjeta.  She is  also here to discuss starting tamoxifen antiestrogen therapy.  She has recovered fairly well from the effects of radiation.  REVIEW OF SYSTEMS:   Constitutional: Denies fevers, chills or abnormal weight loss Eyes: Denies blurriness of vision Ears, nose, mouth, throat, and face: Denies mucositis or sore throat Respiratory: Denies cough, dyspnea or wheezes Cardiovascular: Denies palpitation, chest discomfort Gastrointestinal:  Denies nausea, heartburn or change in bowel habits Skin: Denies abnormal skin rashes Lymphatics: Denies new lymphadenopathy or easy bruising Neurological:Denies numbness, tingling or new weaknesses Behavioral/Psych: Mood is stable, no new changes  Extremities: No lower extremity edema Breast:  denies any pain or lumps or nodules in either breasts All other systems were reviewed with the patient and are negative.  I have reviewed the past medical history, past surgical history, social history and family history with the patient and they are unchanged from previous note.  ALLERGIES:  is allergic to asa [aspirin].  MEDICATIONS:  Current Outpatient Medications  Medication Sig Dispense Refill  . ALPRAZolam (XANAX) 0.5 MG tablet Take 0.5 mg by mouth 2 (two) times daily as needed for anxiety.     . ENSURE (ENSURE) Take 237 mLs by mouth daily.    Marland Kitchen lidocaine-prilocaine (EMLA) cream Apply to affected area once 30 g 3  . LORazepam (ATIVAN) 0.5 MG tablet Take 1 tablet (0.5 mg total) by mouth at bedtime. 30 tablet 0  . ondansetron (ZOFRAN) 8 MG tablet Take 1 tablet (8 mg total) by mouth 2 (two)  times daily as needed for refractory nausea / vomiting. Start on day 3 after chemo. (Patient not taking: Reported on 05/18/2017) 30 tablet 1  . oxyCODONE-acetaminophen (PERCOCET/ROXICET) 5-325 MG tablet Take 1 tablet by mouth every 4 (four) hours as needed for severe pain. 40 tablet 0  . prochlorperazine (COMPAZINE) 10 MG tablet Take 1 tablet (10 mg total) by mouth every 6 (six) hours as  needed (Nausea or vomiting). (Patient not taking: Reported on 05/18/2017) 30 tablet 1  . risperiDONE microspheres (RISPERDAL CONSTA) 50 MG injection Inject 50 mg into the muscle every 14 (fourteen) days.    . tamoxifen (NOLVADEX) 10 MG tablet Take 1 tablet (10 mg total) by mouth 2 (two) times daily. 30 tablet 3   No current facility-administered medications for this visit.    Facility-Administered Medications Ordered in Other Visits  Medication Dose Route Frequency Provider Last Rate Last Dose  . sodium chloride flush (NS) 0.9 % injection 10 mL  10 mL Intracatheter PRN Nicholas Lose, MD        PHYSICAL EXAMINATION: ECOG PERFORMANCE STATUS: 1 - Symptomatic but completely ambulatory  Vitals:   08/21/17 1438  BP: 104/72  Pulse: 95  Resp: 18  Temp: 98.6 F (37 C)  SpO2: 99%   Filed Weights   08/21/17 1438  Weight: 93 lb 4.8 oz (42.3 kg)    GENERAL:alert, no distress and comfortable SKIN: skin color, texture, turgor are normal, no rashes or significant lesions EYES: normal, Conjunctiva are pink and non-injected, sclera clear OROPHARYNX:no exudate, no erythema and lips, buccal mucosa, and tongue normal  NECK: supple, thyroid normal size, non-tender, without nodularity LYMPH:  no palpable lymphadenopathy in the cervical, axillary or inguinal LUNGS: clear to auscultation and percussion with normal breathing effort HEART: regular rate & rhythm and no murmurs and no lower extremity edema ABDOMEN:abdomen soft, non-tender and normal bowel sounds MUSCULOSKELETAL:no cyanosis of digits and no clubbing  NEURO: alert & oriented x 3 with fluent speech, no focal motor/sensory deficits EXTREMITIES: No lower extremity edema  LABORATORY DATA:  I have reviewed the data as listed CMP Latest Ref Rng & Units 08/18/2017 06/14/2017 05/05/2017  Glucose 70 - 140 mg/dL 95 88 120  BUN 7 - 26 mg/dL '14 13 14  '$ Creatinine 0.60 - 1.10 mg/dL 1.04 0.98 0.84  Sodium 136 - 145 mmol/L 139 143 140  Potassium 3.5 -  5.1 mmol/L 3.7 3.6 3.8  Chloride 98 - 109 mmol/L 105 106 103  CO2 22 - 29 mmol/L 29 30(H) 29  Calcium 8.4 - 10.4 mg/dL 9.2 9.5 9.1  Total Protein 6.4 - 8.3 g/dL 7.0 7.3 7.1  Total Bilirubin 0.2 - 1.2 mg/dL 0.3 0.2 <0.2(L)  Alkaline Phos 40 - 150 U/L 59 58 64  AST 5 - 34 U/L '17 13 15  '$ ALT 0 - 55 U/L '11 9 5    '$ Lab Results  Component Value Date   WBC 3.2 (L) 08/18/2017   HGB 10.8 (L) 08/18/2017   HCT 32.7 (L) 08/18/2017   MCV 104.4 (H) 08/18/2017   PLT 236 08/18/2017   NEUTROABS 1.2 (L) 08/18/2017    ASSESSMENT & PLAN:  Malignant neoplasm of upper-outer quadrant of left breast in female, estrogen receptor positive ([place]) 12/14/2016: Left modified radical mastectomy: IDC grade 3, spans 5 cm and a 2.2 cm with extensive DCIS grade 3, lymphovascular invasion present, margins negative, 17/17 lymph nodes positive, ER 30%, PR 0%, HER-2 positive, Ki-67 20%, T2 N3a M0 stage IIIB AJCC 8  Patient has long-standing  issues with bipolar disorder and substance abuse previously. CT CAP and Bone Scan 11/17/2016: No metastases 11 mm left lower lobe groundglass nodule probably infectious etiology   Treatment plan:  1. Adjuvant chemotherapy with TCH Perjeta every 3 weeks 6 followed by Herceptin Perjeta maintenance for1 year 3. Followed by adjuvant radiation 06/07/2017-08/01/2017 4. Followed by adjuvant antiestrogen therapy 5-7 years started 08/21/2017 ------------------------------------------------------------------------- Current treatment: Herceptin and Perjeta Echocardiogram: Ejection fraction 50% on 12/06/2016  Since he completed adjuvant radiation therapy, I recommended starting her on tamoxifen 20 mg daily. We discussed the risks and benefits of tamoxifen. These include but not limited to insomnia, hot flashes, mood changes, vaginal dryness, and weight gain. Although rare, serious side effects including endometrial cancer, risk of blood clots were also discussed. We strongly believe that the  benefits far outweigh the risks. Patient understands these risks and consented to starting treatment. Planned treatment duration is 10 years.  Return to clinic in 3 weeks for every 3-week Herceptin and Perjeta maintenance and now on tamoxifen.      No orders of the defined types were placed in this encounter.  The patient has a good understanding of the overall plan. she agrees with it. she will call with any problems that may develop before the next visit here.   Harriette Ohara, MD 08/21/17

## 2017-08-22 ENCOUNTER — Ambulatory Visit: Payer: Medicaid Other

## 2017-08-23 ENCOUNTER — Telehealth: Payer: Self-pay | Admitting: Hematology and Oncology

## 2017-08-23 ENCOUNTER — Ambulatory Visit: Payer: Medicaid Other

## 2017-08-23 NOTE — Telephone Encounter (Signed)
Spoke to patient regarding upcoming may and June appointments

## 2017-08-24 ENCOUNTER — Ambulatory Visit: Payer: Medicaid Other

## 2017-08-25 ENCOUNTER — Ambulatory Visit: Payer: Medicaid Other

## 2017-08-28 ENCOUNTER — Ambulatory Visit: Payer: Medicaid Other

## 2017-08-29 ENCOUNTER — Telehealth: Payer: Self-pay | Admitting: Hematology and Oncology

## 2017-08-29 ENCOUNTER — Inpatient Hospital Stay: Payer: Medicaid Other

## 2017-08-29 ENCOUNTER — Ambulatory Visit: Payer: Medicaid Other

## 2017-08-29 NOTE — Telephone Encounter (Signed)
Returned call to patient re message left yesterday asking that herc appointment for today be moved to later on this week. We are at capacity in infusion this week as well as next week. Left message for patient asking that she call office re rescheduling options. Left message for [facility] desk nurse re this message.

## 2017-08-30 ENCOUNTER — Ambulatory Visit: Payer: Medicaid Other

## 2017-08-31 ENCOUNTER — Ambulatory Visit: Payer: Medicaid Other

## 2017-08-31 ENCOUNTER — Other Ambulatory Visit: Payer: Self-pay

## 2017-08-31 DIAGNOSIS — Z170 Estrogen receptor positive status [ER+]: Principal | ICD-10-CM

## 2017-08-31 DIAGNOSIS — C50412 Malignant neoplasm of upper-outer quadrant of left female breast: Secondary | ICD-10-CM

## 2017-08-31 MED ORDER — OXYCODONE-ACETAMINOPHEN 5-325 MG PO TABS: 1.0000 | ORAL_TABLET | ORAL | 0 refills | Status: DC | PRN

## 2017-08-31 NOTE — Telephone Encounter (Signed)
Returned pt's call regarding wanting to know her upcoming appts and a refill on Oxcodone.  Let pt be aware of upcoming appts on 6/11 and 7/2.  Voiced understanding. Prescription submitted for approval per Dr Lindi Adie and she will pick up in cancer center tomorrow. No other needs per pt at this time.

## 2017-09-01 ENCOUNTER — Other Ambulatory Visit: Payer: Self-pay

## 2017-09-01 ENCOUNTER — Telehealth: Payer: Self-pay

## 2017-09-01 DIAGNOSIS — C50412 Malignant neoplasm of upper-outer quadrant of left female breast: Secondary | ICD-10-CM

## 2017-09-01 DIAGNOSIS — Z170 Estrogen receptor positive status [ER+]: Principal | ICD-10-CM

## 2017-09-01 MED ORDER — GABAPENTIN 300 MG PO CAPS: 300.0000 mg | ORAL_CAPSULE | Freq: Every day | ORAL | 0 refills | Status: DC

## 2017-09-01 NOTE — Telephone Encounter (Signed)
Roz RN triage nurse came over to desk for patient - she is in lobby picking up prescription for Oxycodone.  Pt reports she has to take 2 tabs every 4 hours for pain in her feet, "they hurt so bad".  Notified Dr Lindi Adie.  He ordered Gabapentin 300 mg at bedtime ( #90 quantity ) for patient to try for pain for a month, after that, she can call us and Dr Lindi Adie said he may go up to three times a day if it is helping.    Pt with questions about what is Gabapentin and explained for nerve pain and cautioned her that it can cause drowsiness, ie regarding if she drives, but that it is ordered for bedtime for now. Pt voiced understanding. Per Dr Lindi Adie, ok to give the prescription as ordered for Oxcodone but explained to pt that by adding Gabapentin, she may eventually not need Oxycodone.  Pt voiced understanding. She has appt on 6/11 with Dr Lindi Adie and will discuss if Gabapentin is helping.  No other needs per pt at this time.

## 2017-09-12 ENCOUNTER — Encounter: Payer: Self-pay | Admitting: Radiation Oncology

## 2017-09-12 NOTE — Progress Notes (Signed)
  Radiation Oncology         [phone] ________________________________  Name: Diane Rosario MRN: [id number]  Date: 09/12/2017  DOB: 04-26-1975  End of Treatment Note  Diagnosis:   IDC grade 3, spans 5 cm and a 2.2 cm with extensive DCIS grade 3, lymphovascular invasion present, margins negative, 17/17 lymph nodes positive, ER 30%, PR 0%, HER-2 positive, Ki-67 20%, T2 N3a M0 stage IIIB AJCC 8     Indication for treatment:  Curative        Radiation treatment dates:   06/07/17 - 08/01/17  Site/dose:   Left chest wall and subclavian treated to 50.4 Gy with 28 fx of 1.8 Gy followed by a boost of 10 Gy with 5 fx of 2 Gy.  This was the planned dose. However, the patient ultimately received a total of 19 treatments out of a planned 33 treatments. See below.  Beams/energy:   3D / 6X, 15X  Narrative: The patient tolerated radiation treatment relatively well, but the patient was unable to calm to the daily treatments consistently. Social services and other members of the treatment team reach out to the patient to try to provide whatever support was available, but ultimately the patient was not able to make the treatments in a manner that is consistent with effective radiation treatment. We therefore discontinued treatment with there being room to give additional radiation treatment in the future if necessary.   Plan: The patient has completed radiation treatment. The patient will return to radiation oncology clinic for routine followup in one month. I advised them to call or return sooner if they have any questions or concerns related to their recovery or treatment.  ------------------------------------------------  Jodelle Gross, MD, PhD   This document serves as a record of services personally performed by Kyung Rudd, MD. It was created on his behalf by Linward Natal, a trained medical scribe. The creation of this record is based on the scribe's personal observations and the provider's  statements to them. This document has been checked and approved by the attending provider.

## 2017-09-19 ENCOUNTER — Inpatient Hospital Stay (HOSPITAL_BASED_OUTPATIENT_CLINIC_OR_DEPARTMENT_OTHER): Payer: Medicaid Other | Admitting: Hematology and Oncology

## 2017-09-19 ENCOUNTER — Inpatient Hospital Stay: Payer: Medicaid Other

## 2017-09-19 ENCOUNTER — Other Ambulatory Visit: Payer: Self-pay

## 2017-09-19 ENCOUNTER — Inpatient Hospital Stay: Payer: Medicaid Other | Attending: Hematology and Oncology

## 2017-09-19 ENCOUNTER — Telehealth: Payer: Self-pay | Admitting: Hematology and Oncology

## 2017-09-19 DIAGNOSIS — R252 Cramp and spasm: Secondary | ICD-10-CM | POA: Diagnosis not present

## 2017-09-19 DIAGNOSIS — Z170 Estrogen receptor positive status [ER+]: Secondary | ICD-10-CM

## 2017-09-19 DIAGNOSIS — N951 Menopausal and female climacteric states: Secondary | ICD-10-CM | POA: Diagnosis not present

## 2017-09-19 DIAGNOSIS — Z5112 Encounter for antineoplastic immunotherapy: Secondary | ICD-10-CM | POA: Insufficient documentation

## 2017-09-19 DIAGNOSIS — Z5181 Encounter for therapeutic drug level monitoring: Secondary | ICD-10-CM

## 2017-09-19 DIAGNOSIS — C50412 Malignant neoplasm of upper-outer quadrant of left female breast: Secondary | ICD-10-CM

## 2017-09-19 DIAGNOSIS — Z95828 Presence of other vascular implants and grafts: Secondary | ICD-10-CM

## 2017-09-19 DIAGNOSIS — C773 Secondary and unspecified malignant neoplasm of axilla and upper limb lymph nodes: Secondary | ICD-10-CM | POA: Diagnosis not present

## 2017-09-19 DIAGNOSIS — C50812 Malignant neoplasm of overlapping sites of left female breast: Secondary | ICD-10-CM

## 2017-09-19 DIAGNOSIS — Z79899 Other long term (current) drug therapy: Principal | ICD-10-CM

## 2017-09-19 LAB — CBC WITH DIFFERENTIAL/PLATELET
Basophils Absolute: 0 10*3/uL (ref 0.0–0.1)
Basophils Relative: 1 %
Eosinophils Absolute: 0.1 10*3/uL (ref 0.0–0.5)
Eosinophils Relative: 1 %
HCT: 37.3 % (ref 34.8–46.6)
Hemoglobin: 12.1 g/dL (ref 11.6–15.9)
Lymphocytes Relative: 40 %
Lymphs Abs: 1.7 10*3/uL (ref 0.9–3.3)
MCH: 33.7 pg (ref 25.1–34.0)
MCHC: 32.4 g/dL (ref 31.5–36.0)
MCV: 103.9 fL — ABNORMAL HIGH (ref 79.5–101.0)
Monocytes Absolute: 0.4 10*3/uL (ref 0.1–0.9)
Monocytes Relative: 9 %
Neutro Abs: 2.1 10*3/uL (ref 1.5–6.5)
Neutrophils Relative %: 49 %
Platelets: 261 10*3/uL (ref 145–400)
RBC: 3.59 MIL/uL — ABNORMAL LOW (ref 3.70–5.45)
RDW: 12.9 % (ref 11.2–14.5)
WBC: 4.3 10*3/uL (ref 3.9–10.3)

## 2017-09-19 LAB — COMPREHENSIVE METABOLIC PANEL
ALT: 7 U/L (ref 0–55)
AST: 17 U/L (ref 5–34)
Albumin: 3.9 g/dL (ref 3.5–5.0)
Alkaline Phosphatase: 72 U/L (ref 40–150)
Anion gap: 9 (ref 3–11)
BUN: 13 mg/dL (ref 7–26)
CO2: 28 mmol/L (ref 22–29)
Calcium: 9.6 mg/dL (ref 8.4–10.4)
Chloride: 103 mmol/L (ref 98–109)
Creatinine, Ser: 1 mg/dL (ref 0.60–1.10)
GFR calc Af Amer: >60 mL/min (ref >60)
GFR calc non Af Amer: >60 mL/min (ref >60)
Glucose, Bld: 83 mg/dL (ref 70–140)
Potassium: 3.8 mmol/L (ref 3.5–5.1)
Sodium: 140 mmol/L (ref 136–145)
Total Bilirubin: 0.4 mg/dL (ref 0.2–1.2)
Total Protein: 7.5 g/dL (ref 6.4–8.3)

## 2017-09-19 MED ORDER — DIPHENHYDRAMINE HCL 25 MG PO CAPS
50.0000 mg | ORAL_CAPSULE | Freq: Once | ORAL | Status: AC
  Administered 2017-09-19: 50 mg via ORAL

## 2017-09-19 MED ORDER — HEPARIN SOD (PORK) LOCK FLUSH 100 UNIT/ML IV SOLN
500.0000 [IU] | Freq: Once | INTRAVENOUS | Status: AC | PRN
  Administered 2017-09-19: 500 [IU]
  Filled 2017-09-19: qty 5

## 2017-09-19 MED ORDER — SODIUM CHLORIDE 0.9 % IV SOLN
8.0000 mg/kg | Freq: Once | INTRAVENOUS | Status: AC
  Administered 2017-09-19: 336 mg via INTRAVENOUS
  Filled 2017-09-19: qty 16

## 2017-09-19 MED ORDER — ACETAMINOPHEN 325 MG PO TABS
650.0000 mg | ORAL_TABLET | Freq: Once | ORAL | Status: AC
  Administered 2017-09-19: 650 mg via ORAL

## 2017-09-19 MED ORDER — SODIUM CHLORIDE 0.9% FLUSH
10.0000 mL | INTRAVENOUS | Status: DC | PRN
  Administered 2017-09-19: 10 mL via INTRAVENOUS
  Filled 2017-09-19: qty 10

## 2017-09-19 MED ORDER — SODIUM CHLORIDE 0.9 % IV SOLN: 420.0000 mg | Freq: Once | INTRAVENOUS | Status: DC

## 2017-09-19 MED ORDER — SODIUM CHLORIDE 0.9 % IV SOLN
840.0000 mg | Freq: Once | INTRAVENOUS | Status: AC
  Administered 2017-09-19: 840 mg via INTRAVENOUS
  Filled 2017-09-19: qty 28

## 2017-09-19 MED ORDER — SODIUM CHLORIDE 0.9 % IV SOLN
Freq: Once | INTRAVENOUS | Status: AC
  Administered 2017-09-19: 16:00:00 via INTRAVENOUS

## 2017-09-19 MED ORDER — TRASTUZUMAB CHEMO 150 MG IV SOLR: 6.0000 mg/kg | Freq: Once | INTRAVENOUS | Status: DC

## 2017-09-19 MED ORDER — SODIUM CHLORIDE 0.9% FLUSH
10.0000 mL | INTRAVENOUS | Status: DC | PRN
  Administered 2017-09-19: 10 mL
  Filled 2017-09-19: qty 10

## 2017-09-19 MED ORDER — ACETAMINOPHEN 325 MG PO TABS
ORAL_TABLET | ORAL | Status: AC
  Filled 2017-09-19: qty 2

## 2017-09-19 MED ORDER — DIPHENHYDRAMINE HCL 25 MG PO CAPS
ORAL_CAPSULE | ORAL | Status: AC
  Filled 2017-09-19: qty 2

## 2017-09-19 MED ORDER — TAMOXIFEN CITRATE 20 MG PO TABS: 20.0000 mg | ORAL_TABLET | Freq: Two times a day (BID) | ORAL | 3 refills | Status: DC

## 2017-09-19 NOTE — Assessment & Plan Note (Signed)
12/14/2016: Left modified radical mastectomy: IDC grade 3, spans 5 cm and a 2.2 cm with extensive DCIS grade 3, lymphovascular invasion present, margins negative, 17/17 lymph nodes positive, ER 30%, PR 0%, HER-2 positive, Ki-67 20%, T2 N3a M0 stage IIIB AJCC 8  Patient has long-standing issues with bipolar disorder and substance abuse previously. CT CAP and Bone Scan 11/17/2016: No metastases 11 mm left lower lobe groundglass nodule probably infectious etiology   Treatment plan:  1. Adjuvant chemotherapy with TCH Perjeta every 3 weeks 6 followed by Herceptin Perjeta maintenance for1 year 3. Adjuvant radiation 06/07/2017-08/01/2017 4. Followed by adjuvant antiestrogen therapy with tamoxifen 10 years started 08/21/2017 ------------------------------------------------------------------------- Current treatment:Herceptin andPerjeta; tamoxifen Echocardiogram: Ejection fraction 50% on 12/06/2016  Tamoxifen toxicities:  Herceptin and Perjeta toxicities: Denies any diarrhea  Return to clinic every 3 weeks for Herceptin and Perjeta in every 6 weeks for follow-up with me.  Treatment will be completed in September 2019.

## 2017-09-19 NOTE — Progress Notes (Signed)
Pt will receive re-loading doses of Herceptin & Perjeta today. Pt last received both drugs on 05/05/17. MD gave ok for reload back in March 2019 but pt did not come until today; multiple "no shows". Kennith Center, Pharm.D., CPP 09/19/2017@3 :07 PM

## 2017-09-19 NOTE — Telephone Encounter (Signed)
Gave avs and calender per May put MD on 9th visit

## 2017-09-19 NOTE — Progress Notes (Signed)
Per Dr. Lindi Adie okay to treat with echo from 06/08/17

## 2017-09-19 NOTE — Progress Notes (Signed)
Patient Care Team: Department, [facility] as PCP - General [name], MD as Consulting Physician (General Surgery) Nicholas Lose, MD as Consulting Physician (Hematology and Oncology) Kyung Rudd, MD as Consulting Physician (Radiation Oncology)  DIAGNOSIS:  Encounter Diagnosis  Name Primary?  . Malignant neoplasm of upper-outer quadrant of left breast in female, estrogen receptor positive ([place])     SUMMARY OF ONCOLOGIC HISTORY:   Malignant neoplasm of upper-outer quadrant of left breast in female, estrogen receptor positive ([place])   11/01/2016 Initial Diagnosis    Palpable left breast mass with 4 enl axillary lymph nodes 4.8 x 0.8 x 3.1 cm; left axillary mass 1.1 x 0.5 cm, lymph node 1.4 x 0.8 cm biopsy grade 2-3 IDC ER 30% PR 0% HER-2 positive ratio 7.53, Ki-67 20%, T2 N2 MX stage IIIa clinical stage      12/02/2016 Genetic Testing    Negative genetic testing on the STAT panel.  The STAT Breast cancer panel offered by [facility] includes sequencing and rearrangement analysis for the following 9 genes:  ATM, BRCA1, BRCA2, CDH1, CHEK2, PALB2, PTEN, STK11 and TP53.   The report date is December 02, 2016.      12/14/2016 Surgery    Left modified radical mastectomy: IDC grade 3, spans 5 cm and a 2.2 cm with extensive DCIS grade 3, lymphovascular invasion present, margins negative, 17/17 lymph nodes positive, ER 30%, PR 0%, HER-2 positive, Ki-67 20%, T2 N3a M0 stage IIIB AJCC 8      01/10/2017 -  Chemotherapy    Adjuvant chemotherapy with TCH Perjeta 6 cycles followed by Herceptin Perjeta maintenance       06/07/2017 - 07/25/2017 Radiation Therapy    Adjuvant radiation therapy       CHIEF COMPLIANT: Follow-up on Herceptin Perjeta and tamoxifen  INTERVAL HISTORY: Diane Rosario is a 42 year old with above-mentioned history of left breast cancer treated with mastectomy followed by adjuvant chemotherapy and radiation.  She is currently on Herceptin Perjeta maintenance which  will be completed in September 2019.  She appears to be tolerating both of these drugs extremely well.  She was started on tamoxifen therapy in May and is also here to discuss the side effects of tamoxifen.  She has been tolerating tamoxifen fairly well.  She does not have any hot flashes or arthralgias or myalgias.  REVIEW OF SYSTEMS:   Constitutional: Denies fevers, chills or abnormal weight loss Eyes: Denies blurriness of vision Ears, nose, mouth, throat, and face: Denies mucositis or sore throat Respiratory: Denies cough, dyspnea or wheezes Cardiovascular: Denies palpitation, chest discomfort Gastrointestinal:  Denies nausea, heartburn or change in bowel habits Skin: Denies abnormal skin rashes Lymphatics: Denies new lymphadenopathy or easy bruising Neurological:Denies numbness, tingling or new weaknesses Behavioral/Psych: Mood is stable, no new changes  Extremities: No lower extremity edema  All other systems were reviewed with the patient and are negative.  I have reviewed the past medical history, past surgical history, social history and family history with the patient and they are unchanged from previous note.  ALLERGIES:  is allergic to asa [aspirin].  MEDICATIONS:  Current Outpatient Medications  Medication Sig Dispense Refill  . ALPRAZolam (XANAX) 0.5 MG tablet Take 0.5 mg by mouth 2 (two) times daily as needed for anxiety.     . ENSURE (ENSURE) Take 237 mLs by mouth daily.    Marland Kitchen gabapentin (NEURONTIN) 300 MG capsule Take 1 capsule (300 mg total) by mouth at bedtime. 90 capsule 0  . lidocaine-prilocaine (EMLA) cream  Apply to affected area once 30 g 3  . LORazepam (ATIVAN) 0.5 MG tablet Take 1 tablet (0.5 mg total) by mouth at bedtime. 30 tablet 0  . ondansetron (ZOFRAN) 8 MG tablet Take 1 tablet (8 mg total) by mouth 2 (two) times daily as needed for refractory nausea / vomiting. Start on day 3 after chemo. (Patient not taking: Reported on 05/18/2017) 30 tablet 1  .  oxyCODONE-acetaminophen (PERCOCET/ROXICET) 5-325 MG tablet Take 1 tablet by mouth every 4 (four) hours as needed for severe pain. 40 tablet 0  . prochlorperazine (COMPAZINE) 10 MG tablet Take 1 tablet (10 mg total) by mouth every 6 (six) hours as needed (Nausea or vomiting). (Patient not taking: Reported on 05/18/2017) 30 tablet 1  . risperiDONE microspheres (RISPERDAL CONSTA) 50 MG injection Inject 50 mg into the muscle every 14 (fourteen) days.    . tamoxifen (NOLVADEX) 20 MG tablet Take 1 tablet (20 mg total) by mouth 2 (two) times daily. 90 tablet 3   No current facility-administered medications for this visit.    Facility-Administered Medications Ordered in Other Visits  Medication Dose Route Frequency Provider Last Rate Last Dose  . sodium chloride flush (NS) 0.9 % injection 10 mL  10 mL Intracatheter PRN Nicholas Lose, MD        PHYSICAL EXAMINATION: ECOG PERFORMANCE STATUS: 1 - Symptomatic but completely ambulatory  Vitals:   09/19/17 1336  BP: 96/72  Pulse: 91  Resp: 18  Temp: 98.1 F (36.7 C)  SpO2: 99%   Filed Weights   09/19/17 1336  Weight: 94 lb 9.6 oz (42.9 kg)    GENERAL:alert, no distress and comfortable SKIN: skin color, texture, turgor are normal, no rashes or significant lesions EYES: normal, Conjunctiva are pink and non-injected, sclera clear OROPHARYNX:no exudate, no erythema and lips, buccal mucosa, and tongue normal  NECK: supple, thyroid normal size, non-tender, without nodularity LYMPH:  no palpable lymphadenopathy in the cervical, axillary or inguinal LUNGS: clear to auscultation and percussion with normal breathing effort HEART: regular rate & rhythm and no murmurs and no lower extremity edema ABDOMEN:abdomen soft, non-tender and normal bowel sounds MUSCULOSKELETAL:no cyanosis of digits and no clubbing  NEURO: alert & oriented x 3 with fluent speech, no focal motor/sensory deficits EXTREMITIES: No lower extremity edema  LABORATORY DATA:  I have  reviewed the data as listed CMP Latest Ref Rng & Units 08/18/2017 06/14/2017 05/05/2017  Glucose 70 - 140 mg/dL 95 88 120  BUN 7 - 26 mg/dL '14 13 14  '$ Creatinine 0.60 - 1.10 mg/dL 1.04 0.98 0.84  Sodium 136 - 145 mmol/L 139 143 140  Potassium 3.5 - 5.1 mmol/L 3.7 3.6 3.8  Chloride 98 - 109 mmol/L 105 106 103  CO2 22 - 29 mmol/L 29 30(H) 29  Calcium 8.4 - 10.4 mg/dL 9.2 9.5 9.1  Total Protein 6.4 - 8.3 g/dL 7.0 7.3 7.1  Total Bilirubin 0.2 - 1.2 mg/dL 0.3 0.2 <0.2(L)  Alkaline Phos 40 - 150 U/L 59 58 64  AST 5 - 34 U/L '17 13 15  '$ ALT 0 - 55 U/L '11 9 5    '$ Lab Results  Component Value Date   WBC 4.3 09/19/2017   HGB 12.1 09/19/2017   HCT 37.3 09/19/2017   MCV 103.9 (H) 09/19/2017   PLT 261 09/19/2017   NEUTROABS 2.1 09/19/2017    ASSESSMENT & PLAN:  Malignant neoplasm of upper-outer quadrant of left breast in female, estrogen receptor positive ([place]) 12/14/2016: Left modified radical mastectomy: IDC grade  3, spans 5 cm and a 2.2 cm with extensive DCIS grade 3, lymphovascular invasion present, margins negative, 17/17 lymph nodes positive, ER 30%, PR 0%, HER-2 positive, Ki-67 20%, T2 N3a M0 stage IIIB AJCC 8  Patient has Rosario-standing issues with bipolar disorder and substance abuse previously. CT CAP and Bone Scan 11/17/2016: No metastases 11 mm left lower lobe groundglass nodule probably infectious etiology   Treatment plan:  1. Adjuvant chemotherapy with TCH Perjeta every 3 weeks 6 followed by Herceptin Perjeta maintenance for1 year 3. Adjuvant radiation 06/07/2017-08/01/2017 4. Followed by adjuvant antiestrogen therapy with tamoxifen  10 years started 08/21/2017 ------------------------------------------------------------------------- Current treatment:Herceptin andPerjeta; tamoxifen Echocardiogram: Ejection fraction 50% on 12/06/2016  Tamoxifen toxicities: Hot flashes and muscle cramps were present even before starting tamoxifen.  She tells me that she takes Percocets  because of those symptoms. I discussed with her that she needs to take nonsteroidal anti-inflammatories instead of Percocets. I instructed her that we will renew Percocet for 1 more time in June 23 and after that she will have to transition to oral nonsteroidal anti-inflammatory drugs.  Herceptin and Perjeta toxicities: Denies any diarrhea  Return to clinic every 3 weeks for Herceptin and Perjeta in every 6 weeks for follow-up with me.  Treatment will be completed in November 2019.  No orders of the defined types were placed in this encounter.  The patient has a good understanding of the overall plan. she agrees with it. she will call with any problems that may develop before the next visit here.   Harriette Ohara, MD 09/19/17

## 2017-09-19 NOTE — Patient Instructions (Signed)
[facility] Discharge Instructions for Patients Receiving Chemotherapy  Today you received the following chemotherapy agents Herceptin and Perjeta.   To help prevent nausea and vomiting after your treatment, we encourage you to take your nausea medication as directed.   If you develop nausea and vomiting that is not controlled by your nausea medication, call the clinic.   BELOW ARE SYMPTOMS THAT SHOULD BE REPORTED IMMEDIATELY:  *FEVER GREATER THAN 100.5 F  *CHILLS WITH OR WITHOUT FEVER  NAUSEA AND VOMITING THAT IS NOT CONTROLLED WITH YOUR NAUSEA MEDICATION  *UNUSUAL SHORTNESS OF BREATH  *UNUSUAL BRUISING OR BLEEDING  TENDERNESS IN MOUTH AND THROAT WITH OR WITHOUT PRESENCE OF ULCERS  *URINARY PROBLEMS  *BOWEL PROBLEMS  UNUSUAL RASH Items with * indicate a potential emergency and should be followed up as soon as possible.  Feel free to call the clinic should you have any questions or concerns. The clinic phone number is [phone].  Please show the CHEMO ALERT CARD at check-in to the Emergency Department and triage nurse.   

## 2017-09-19 NOTE — Progress Notes (Signed)
Okay to treat with Herceptin/ Perjeta with previous echo per Dr.Gudena. Pt will be scheduled for echo prior to next treatment.

## 2017-10-02 ENCOUNTER — Other Ambulatory Visit: Payer: Self-pay

## 2017-10-02 ENCOUNTER — Telehealth: Payer: Self-pay | Admitting: *Deleted

## 2017-10-02 DIAGNOSIS — Z170 Estrogen receptor positive status [ER+]: Principal | ICD-10-CM

## 2017-10-02 DIAGNOSIS — C50412 Malignant neoplasm of upper-outer quadrant of left female breast: Secondary | ICD-10-CM

## 2017-10-02 MED ORDER — OXYCODONE-ACETAMINOPHEN 5-325 MG PO TABS: 1.0000 | ORAL_TABLET | ORAL | 0 refills | Status: DC | PRN

## 2017-10-02 NOTE — Telephone Encounter (Signed)
Pt calling to request refill on her pain medication. Pt will have a relative come today to pick it up. Per Dr.Gudena's last note, last refill this month, then switch to NSAID for pain management. Pt aware.

## 2017-10-02 NOTE — Telephone Encounter (Signed)
Received call from pt asking for refill on her oxycodone.  She reports being out of med.  Message routed to [place]

## 2017-10-02 NOTE — Telephone Encounter (Signed)
Will call pt regarding refill thank you

## 2017-10-03 ENCOUNTER — Inpatient Hospital Stay (HOSPITAL_COMMUNITY): Admission: RE | Admit: 2017-10-03 | Payer: Medicaid Other | Source: Ambulatory Visit

## 2017-10-10 ENCOUNTER — Inpatient Hospital Stay: Payer: Medicaid Other | Attending: Hematology and Oncology

## 2017-10-13 ENCOUNTER — Inpatient Hospital Stay (HOSPITAL_COMMUNITY): Admission: RE | Admit: 2017-10-13 | Payer: Medicaid Other | Source: Ambulatory Visit

## 2017-10-31 ENCOUNTER — Ambulatory Visit: Payer: Medicaid Other

## 2017-10-31 ENCOUNTER — Other Ambulatory Visit: Payer: Medicaid Other

## 2017-11-21 ENCOUNTER — Telehealth: Payer: Self-pay | Admitting: Hematology and Oncology

## 2017-11-21 ENCOUNTER — Inpatient Hospital Stay: Payer: Medicaid Other | Attending: Hematology and Oncology

## 2017-11-21 ENCOUNTER — Inpatient Hospital Stay: Payer: Medicaid Other

## 2017-11-21 ENCOUNTER — Inpatient Hospital Stay (HOSPITAL_BASED_OUTPATIENT_CLINIC_OR_DEPARTMENT_OTHER): Payer: Medicaid Other | Admitting: Hematology and Oncology

## 2017-11-21 DIAGNOSIS — Z5112 Encounter for antineoplastic immunotherapy: Secondary | ICD-10-CM | POA: Insufficient documentation

## 2017-11-21 DIAGNOSIS — G620 Drug-induced polyneuropathy: Secondary | ICD-10-CM | POA: Insufficient documentation

## 2017-11-21 DIAGNOSIS — C773 Secondary and unspecified malignant neoplasm of axilla and upper limb lymph nodes: Secondary | ICD-10-CM

## 2017-11-21 DIAGNOSIS — Z170 Estrogen receptor positive status [ER+]: Secondary | ICD-10-CM | POA: Diagnosis not present

## 2017-11-21 DIAGNOSIS — T451X5A Adverse effect of antineoplastic and immunosuppressive drugs, initial encounter: Secondary | ICD-10-CM | POA: Insufficient documentation

## 2017-11-21 DIAGNOSIS — G629 Polyneuropathy, unspecified: Secondary | ICD-10-CM | POA: Diagnosis not present

## 2017-11-21 DIAGNOSIS — C50412 Malignant neoplasm of upper-outer quadrant of left female breast: Secondary | ICD-10-CM

## 2017-11-21 LAB — COMPREHENSIVE METABOLIC PANEL
ALT: 10 U/L (ref 0–44)
AST: 21 U/L (ref 15–41)
Albumin: 3.8 g/dL (ref 3.5–5.0)
Alkaline Phosphatase: 70 U/L (ref 38–126)
Anion gap: 10 (ref 5–15)
BUN: 21 mg/dL — ABNORMAL HIGH (ref 6–20)
CO2: 28 mmol/L (ref 22–32)
Calcium: 9.5 mg/dL (ref 8.9–10.3)
Chloride: 102 mmol/L (ref 98–111)
Creatinine, Ser: 1.18 mg/dL — ABNORMAL HIGH (ref 0.44–1.00)
GFR calc Af Amer: >60 mL/min (ref >60)
GFR calc non Af Amer: 56 mL/min — ABNORMAL LOW (ref >60)
Glucose, Bld: 105 mg/dL — ABNORMAL HIGH (ref 70–99)
Potassium: 3.6 mmol/L (ref 3.5–5.1)
Sodium: 140 mmol/L (ref 135–145)
Total Bilirubin: 0.4 mg/dL (ref 0.3–1.2)
Total Protein: 7.6 g/dL (ref 6.5–8.1)

## 2017-11-21 LAB — CBC WITH DIFFERENTIAL/PLATELET
Basophils Absolute: 0.1 10*3/uL (ref 0.0–0.1)
Basophils Relative: 1 %
Eosinophils Absolute: 0 10*3/uL (ref 0.0–0.5)
Eosinophils Relative: 1 %
HCT: 35.9 % (ref 34.8–46.6)
Hemoglobin: 12.1 g/dL (ref 11.6–15.9)
Lymphocytes Relative: 53 %
Lymphs Abs: 2.1 10*3/uL (ref 0.9–3.3)
MCH: 33.7 pg (ref 25.1–34.0)
MCHC: 33.6 g/dL (ref 31.5–36.0)
MCV: 100.3 fL (ref 79.5–101.0)
Monocytes Absolute: 0.3 10*3/uL (ref 0.1–0.9)
Monocytes Relative: 9 %
Neutro Abs: 1.4 10*3/uL — ABNORMAL LOW (ref 1.5–6.5)
Neutrophils Relative %: 36 %
Platelets: 264 10*3/uL (ref 145–400)
RBC: 3.58 MIL/uL — ABNORMAL LOW (ref 3.70–5.45)
RDW: 12.6 % (ref 11.2–14.5)
WBC: 3.9 10*3/uL (ref 3.9–10.3)

## 2017-11-21 MED ORDER — TRASTUZUMAB CHEMO 150 MG IV SOLR
6.0000 mg/kg | Freq: Once | INTRAVENOUS | Status: AC
  Administered 2017-11-21: 252 mg via INTRAVENOUS
  Filled 2017-11-21: qty 12

## 2017-11-21 MED ORDER — DIPHENHYDRAMINE HCL 25 MG PO CAPS
ORAL_CAPSULE | ORAL | Status: AC
  Filled 2017-11-21: qty 2

## 2017-11-21 MED ORDER — HEPARIN SOD (PORK) LOCK FLUSH 100 UNIT/ML IV SOLN
500.0000 [IU] | Freq: Once | INTRAVENOUS | Status: AC | PRN
  Administered 2017-11-21: 500 [IU]
  Filled 2017-11-21: qty 5

## 2017-11-21 MED ORDER — ACETAMINOPHEN 325 MG PO TABS
ORAL_TABLET | ORAL | Status: AC
  Filled 2017-11-21: qty 2

## 2017-11-21 MED ORDER — ACETAMINOPHEN 325 MG PO TABS
650.0000 mg | ORAL_TABLET | Freq: Once | ORAL | Status: AC
  Administered 2017-11-21: 650 mg via ORAL

## 2017-11-21 MED ORDER — SODIUM CHLORIDE 0.9 % IV SOLN
Freq: Once | INTRAVENOUS | Status: AC
  Administered 2017-11-21: 13:00:00 via INTRAVENOUS
  Filled 2017-11-21: qty 250

## 2017-11-21 MED ORDER — OXYCODONE-ACETAMINOPHEN 5-325 MG PO TABS: 1.0000 | ORAL_TABLET | ORAL | 0 refills | Status: DC | PRN

## 2017-11-21 MED ORDER — SODIUM CHLORIDE 0.9% FLUSH
10.0000 mL | INTRAVENOUS | Status: DC | PRN
  Administered 2017-11-21: 10 mL
  Filled 2017-11-21: qty 10

## 2017-11-21 MED ORDER — SODIUM CHLORIDE 0.9 % IV SOLN
420.0000 mg | Freq: Once | INTRAVENOUS | Status: AC
  Administered 2017-11-21: 420 mg via INTRAVENOUS
  Filled 2017-11-21: qty 14

## 2017-11-21 MED ORDER — DIPHENHYDRAMINE HCL 25 MG PO CAPS
50.0000 mg | ORAL_CAPSULE | Freq: Once | ORAL | Status: AC
  Administered 2017-11-21: 50 mg via ORAL

## 2017-11-21 NOTE — Patient Instructions (Signed)
[facility] Discharge Instructions for Patients Receiving Chemotherapy  Today you received the following chemotherapy agents :  Herceptin, Perjeta.  To help prevent nausea and vomiting after your treatment, we encourage you to take your nausea medication as prescribed.   If you develop nausea and vomiting that is not controlled by your nausea medication, call the clinic.   BELOW ARE SYMPTOMS THAT SHOULD BE REPORTED IMMEDIATELY:  *FEVER GREATER THAN 100.5 F  *CHILLS WITH OR WITHOUT FEVER  NAUSEA AND VOMITING THAT IS NOT CONTROLLED WITH YOUR NAUSEA MEDICATION  *UNUSUAL SHORTNESS OF BREATH  *UNUSUAL BRUISING OR BLEEDING  TENDERNESS IN MOUTH AND THROAT WITH OR WITHOUT PRESENCE OF ULCERS  *URINARY PROBLEMS  *BOWEL PROBLEMS  UNUSUAL RASH Items with * indicate a potential emergency and should be followed up as soon as possible.  Feel free to call the clinic should you have any questions or concerns. The clinic phone number is [phone].  Please show the CHEMO ALERT CARD at check-in to the Emergency Department and triage nurse.   

## 2017-11-21 NOTE — Progress Notes (Signed)
Per Dr Lindi Adie, ok for Herceptin Perjeta treatment today with Echo from February.

## 2017-11-21 NOTE — Telephone Encounter (Signed)
Per 8/13 los.  Gave patient avs and calendar.

## 2017-11-21 NOTE — Progress Notes (Signed)
Ok to treat with echo from Feb 2019 per Dr.Gudena. Pt had been scheduled for echo several times but did not show for her appt. Send message to scheduling to set her up with an appt with echo this week.

## 2017-11-21 NOTE — Assessment & Plan Note (Signed)
12/14/2016: Left modified radical mastectomy: IDC grade 3, spans 5 cm and a 2.2 cm with extensive DCIS grade 3, lymphovascular invasion present, margins negative, 17/17 lymph nodes positive, ER 30%, PR 0%, HER-2 positive, Ki-67 20%, T2 N3a M0 stage IIIB AJCC 8  Patient has long-standing issues with bipolar disorder and substance abuse previously. CT CAP and Bone Scan 11/17/2016: No metastases 11 mm left lower lobe groundglass nodule probably infectious etiology   Treatment plan:  1. Adjuvant chemotherapy with TCH Perjeta every 3 weeks 6 followed by Herceptin Perjeta maintenance for1 year 3. Adjuvant radiation2/27/2019-08/01/2017 4. Followed by adjuvant antiestrogen therapy with tamoxifen  10 yearsstarted 08/21/2017 ------------------------------------------------------------------------- Current treatment:Herceptin andPerjeta; tamoxifen Echocardiogram: Ejection fraction 50% on 12/06/2016  Tamoxifen toxicities: Hot flashes and muscle cramps were present even before starting tamoxifen.  She tells me that she takes Percocets because of those symptoms. I discussed with her that she needs to take nonsteroidal anti-inflammatories instead of Percocets.  Herceptin and Perjeta toxicities: Denies any diarrhea  Return to clinic every 3 weeks for Herceptin and Perjeta in every 6 weeks for follow-up with me.  Treatment will be completed in November 2019.

## 2017-11-21 NOTE — Progress Notes (Signed)
Patient Care Team: Department, [facility] as PCP - General [name], MD as Consulting Physician (General Surgery) Nicholas Lose, MD as Consulting Physician (Hematology and Oncology) Kyung Rudd, MD as Consulting Physician (Radiation Oncology)  DIAGNOSIS:  Encounter Diagnosis  Name Primary?  . Malignant neoplasm of upper-outer quadrant of left breast in female, estrogen receptor positive ([place])     SUMMARY OF ONCOLOGIC HISTORY:   Malignant neoplasm of upper-outer quadrant of left breast in female, estrogen receptor positive ([place])   11/01/2016 Initial Diagnosis    Palpable left breast mass with 4 enl axillary lymph nodes 4.8 x 0.8 x 3.1 cm; left axillary mass 1.1 x 0.5 cm, lymph node 1.4 x 0.8 cm biopsy grade 2-3 IDC ER 30% PR 0% HER-2 positive ratio 7.53, Ki-67 20%, T2 N2 MX stage IIIa clinical stage    12/02/2016 Genetic Testing    Negative genetic testing on the STAT panel.  The STAT Breast cancer panel offered by [facility] includes sequencing and rearrangement analysis for the following 9 genes:  ATM, BRCA1, BRCA2, CDH1, CHEK2, PALB2, PTEN, STK11 and TP53.   The report date is December 02, 2016.    12/14/2016 Surgery    Left modified radical mastectomy: IDC grade 3, spans 5 cm and a 2.2 cm with extensive DCIS grade 3, lymphovascular invasion present, margins negative, 17/17 lymph nodes positive, ER 30%, PR 0%, HER-2 positive, Ki-67 20%, T2 N3a M0 stage IIIB AJCC 8    01/10/2017 -  Chemotherapy    Adjuvant chemotherapy with TCH Perjeta 6 cycles followed by Herceptin Perjeta maintenance     06/07/2017 - 07/25/2017 Radiation Therapy    Adjuvant radiation therapy     CHIEF COMPLIANT: Herceptin Perjeta maintenance  INTERVAL HISTORY: Diane Rosario is a 42 year old with above-mentioned history of left breast cancer currently on Herceptin and Perjeta maintenance.  She has no side effects.  She missed her last appointment because she slept and did not wake up in time for  treatment.  She denies any diarrhea.  Denies any nausea vomiting.  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  REVIEW OF SYSTEMS:   Constitutional: Denies fevers, chills or abnormal weight loss Eyes: Denies blurriness of vision Ears, nose, mouth, throat, and face: Denies mucositis or sore throat Respiratory: Denies cough, dyspnea or wheezes Cardiovascular: Denies palpitation, chest discomfort Gastrointestinal:  Denies nausea, heartburn or change in bowel habits Skin: Denies abnormal skin rashes Lymphatics: Denies new lymphadenopathy or easy bruising Neurological: Neuropathy in the feet Behavioral/Psych: Mood is stable, no new changes  Extremities: No lower extremity edema Breast:  denies any pain or lumps or nodules in either breasts All other systems were reviewed with the patient and are negative.  I have reviewed the past medical history, past surgical history, social history and family history with the patient and they are unchanged from previous note.  ALLERGIES:  is allergic to asa [aspirin].  MEDICATIONS:  Current Outpatient Medications  Medication Sig Dispense Refill  . ALPRAZolam (XANAX) 0.5 MG tablet Take 0.5 mg by mouth 2 (two) times daily as needed for anxiety.     . ENSURE (ENSURE) Take 237 mLs by mouth daily.    Marland Kitchen gabapentin (NEURONTIN) 300 MG capsule Take 1 capsule (300 mg total) by mouth at bedtime. 90 capsule 0  . lidocaine-prilocaine (EMLA) cream Apply to affected area once 30 g 3  . LORazepam (ATIVAN) 0.5 MG tablet Take 1 tablet (0.5 mg total) by mouth at bedtime. 30 tablet 0  .  ondansetron (ZOFRAN) 8 MG tablet Take 1 tablet (8 mg total) by mouth 2 (two) times daily as needed for refractory nausea / vomiting. Start on day 3 after chemo. (Patient not taking: Reported on 05/18/2017) 30 tablet 1  . oxyCODONE-acetaminophen (PERCOCET/ROXICET) 5-325 MG tablet Take 1 tablet by mouth every 4 (four) hours as needed for severe pain. 40 tablet 0  . prochlorperazine (COMPAZINE)  10 MG tablet Take 1 tablet (10 mg total) by mouth every 6 (six) hours as needed (Nausea or vomiting). (Patient not taking: Reported on 05/18/2017) 30 tablet 1  . risperiDONE microspheres (RISPERDAL CONSTA) 50 MG injection Inject 50 mg into the muscle every 14 (fourteen) days.    . tamoxifen (NOLVADEX) 20 MG tablet Take 1 tablet (20 mg total) by mouth 2 (two) times daily. 90 tablet 3   No current facility-administered medications for this visit.    Facility-Administered Medications Ordered in Other Visits  Medication Dose Route Frequency Provider Last Rate Last Dose  . sodium chloride flush (NS) 0.9 % injection 10 mL  10 mL Intracatheter PRN Nicholas Lose, MD        PHYSICAL EXAMINATION: ECOG PERFORMANCE STATUS: 1 - Symptomatic but completely ambulatory  Vitals:   11/21/17 1134  BP: 90/71  Pulse: 73  Resp: 20  Temp: 98.4 F (36.9 C)  SpO2: 100%   Filed Weights   11/21/17 1134  Weight: 90 lb (40.8 kg)    GENERAL:alert, no distress and comfortable SKIN: skin color, texture, turgor are normal, no rashes or significant lesions EYES: normal, Conjunctiva are pink and non-injected, sclera clear OROPHARYNX:no exudate, no erythema and lips, buccal mucosa, and tongue normal  NECK: supple, thyroid normal size, non-tender, without nodularity LYMPH:  no palpable lymphadenopathy in the cervical, axillary or inguinal LUNGS: clear to auscultation and percussion with normal breathing effort HEART: regular rate & rhythm and no murmurs and no lower extremity edema ABDOMEN:abdomen soft, non-tender and normal bowel sounds MUSCULOSKELETAL:no cyanosis of digits and no clubbing  NEURO: alert & oriented x 3 with fluent speech, neuropathy in the feet EXTREMITIES: No lower extremity edema   LABORATORY DATA:  I have reviewed the data as listed CMP Latest Ref Rng & Units 09/19/2017 08/18/2017 06/14/2017  Glucose 70 - 140 mg/dL 83 95 88  BUN 7 - 26 mg/dL '13 14 13  '$ Creatinine 0.60 - 1.10 mg/dL 1.00 1.04 0.98   Sodium 136 - 145 mmol/L 140 139 143  Potassium 3.5 - 5.1 mmol/L 3.8 3.7 3.6  Chloride 98 - 109 mmol/L 103 105 106  CO2 22 - 29 mmol/L 28 29 30(H)  Calcium 8.4 - 10.4 mg/dL 9.6 9.2 9.5  Total Protein 6.4 - 8.3 g/dL 7.5 7.0 7.3  Total Bilirubin 0.2 - 1.2 mg/dL 0.4 0.3 0.2  Alkaline Phos 40 - 150 U/L 72 59 58  AST 5 - 34 U/L '17 17 13  '$ ALT 0 - 55 U/L '7 11 9    '$ Lab Results  Component Value Date   WBC 3.9 11/21/2017   HGB 12.1 11/21/2017   HCT 35.9 11/21/2017   MCV 100.3 11/21/2017   PLT 264 11/21/2017   NEUTROABS 1.4 (L) 11/21/2017    ASSESSMENT & PLAN:  Malignant neoplasm of upper-outer quadrant of left breast in female, estrogen receptor positive ([place]) 12/14/2016: Left modified radical mastectomy: IDC grade 3, spans 5 cm and a 2.2 cm with extensive DCIS grade 3, lymphovascular invasion present, margins negative, 17/17 lymph nodes positive, ER 30%, PR 0%, HER-2 positive, Ki-67 20%, T2  N3a M0 stage IIIB AJCC 8  Patient has long-standing issues with bipolar disorder and substance abuse previously. CT CAP and Bone Scan 11/17/2016: No metastases 11 mm left lower lobe groundglass nodule probably infectious etiology   Treatment plan:  1. Adjuvant chemotherapy with TCH Perjeta every 3 weeks 6 followed by Herceptin Perjeta maintenance for1 year 3. Adjuvant radiation2/27/2019-08/01/2017 4. Followed by adjuvant antiestrogen therapy with tamoxifen  10 yearsstarted 08/21/2017 ------------------------------------------------------------------------- Current treatment:Herceptin andPerjeta; tamoxifen Echocardiogram: Ejection fraction 50% on 12/06/2016  Tamoxifen toxicities: Hot flashes and muscle cramps were present even before starting tamoxifen.  She tells me that she takes Percocets because of those symptoms. I discussed with her that she needs to take nonsteroidal anti-inflammatories instead of Percocets.  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  I renewed her prescription for Percocets today.  I instructed her to take over-the-counter NSAIDs.  We do not plan to renew her Percocets any further.  Return to clinic every 3 weeks for Herceptin and Perjeta in every 6 weeks for follow-up with me.  Treatment will be completed in December 2019.    No orders of the defined types were placed in this encounter.  The patient has a good understanding of the overall plan. she agrees with it. she will call with any problems that may develop before the next visit here.   Harriette Ohara, MD 11/21/17

## 2017-11-22 ENCOUNTER — Telehealth: Payer: Self-pay

## 2017-11-22 ENCOUNTER — Telehealth: Payer: Self-pay | Admitting: Hematology and Oncology

## 2017-11-22 ENCOUNTER — Other Ambulatory Visit: Payer: Self-pay

## 2017-11-22 DIAGNOSIS — Z5181 Encounter for therapeutic drug level monitoring: Secondary | ICD-10-CM

## 2017-11-22 NOTE — Telephone Encounter (Signed)
Per 8/13 sch message - not able to r/s ECHO per message - new order needed - message sent to RN and Dr. Lindi Adie.

## 2017-11-22 NOTE — Telephone Encounter (Signed)
Informed patient of Echocardiogram scheduled for 11/28/2017 @ 11am.  Nurse educated patient on importance of this test, Per Dr. Lindi Adie patient must complete echo prior to future chemotherapy.  Patient voiced understanding and agreement.

## 2017-11-28 ENCOUNTER — Ambulatory Visit (HOSPITAL_COMMUNITY)
Admission: RE | Admit: 2017-11-28 | Discharge: 2017-11-28 | Disposition: A | Discharge status: Home / Self Care | Payer: Medicaid Other | Source: Ambulatory Visit | Attending: Hematology and Oncology | Admitting: Hematology and Oncology

## 2017-11-28 DIAGNOSIS — I071 Rheumatic tricuspid insufficiency: Secondary | ICD-10-CM | POA: Insufficient documentation

## 2017-11-28 DIAGNOSIS — Z5181 Encounter for therapeutic drug level monitoring: Secondary | ICD-10-CM | POA: Diagnosis present

## 2017-11-28 NOTE — Progress Notes (Signed)
  Echocardiogram 2D Echocardiogram has been performed.  Madelaine Etienne 11/28/2017, 12:16 PM

## 2017-12-13 ENCOUNTER — Inpatient Hospital Stay: Payer: Medicaid Other | Attending: Hematology and Oncology

## 2017-12-13 ENCOUNTER — Inpatient Hospital Stay: Payer: Medicaid Other

## 2017-12-13 DIAGNOSIS — M79671 Pain in right foot: Secondary | ICD-10-CM | POA: Insufficient documentation

## 2017-12-13 DIAGNOSIS — M79672 Pain in left foot: Secondary | ICD-10-CM | POA: Insufficient documentation

## 2017-12-13 DIAGNOSIS — C50412 Malignant neoplasm of upper-outer quadrant of left female breast: Secondary | ICD-10-CM | POA: Insufficient documentation

## 2017-12-13 DIAGNOSIS — Z5112 Encounter for antineoplastic immunotherapy: Secondary | ICD-10-CM | POA: Insufficient documentation

## 2017-12-13 DIAGNOSIS — Z170 Estrogen receptor positive status [ER+]: Secondary | ICD-10-CM | POA: Insufficient documentation

## 2017-12-13 DIAGNOSIS — Z79899 Other long term (current) drug therapy: Secondary | ICD-10-CM | POA: Insufficient documentation

## 2017-12-14 ENCOUNTER — Inpatient Hospital Stay: Admission: RE | Admit: 2017-12-14 | Payer: Medicaid Other | Source: Ambulatory Visit

## 2017-12-15 ENCOUNTER — Telehealth: Payer: Self-pay

## 2017-12-15 NOTE — Telephone Encounter (Signed)
Patient has been rescheduled for infusion on 12/16/2017 at 9am.  Nurse spoke with patient, patient aware and voiced agreement.  No labs needed for treatment per Dr. Lindi Adie.

## 2017-12-16 ENCOUNTER — Ambulatory Visit: Payer: Medicaid Other

## 2017-12-16 ENCOUNTER — Telehealth: Payer: Self-pay | Admitting: *Deleted

## 2017-12-16 NOTE — Telephone Encounter (Signed)
Called pt since she has not shown for treatment today.  She states that her mother had a stroke & she is having surgery today & can't come for treatment. Message routed to Dr Lorinda Creed

## 2017-12-19 ENCOUNTER — Telehealth: Payer: Self-pay | Admitting: Hematology and Oncology

## 2017-12-19 NOTE — Telephone Encounter (Signed)
Appts scheduled LMVM for patient with new date/time per 9/9 msg

## 2017-12-22 ENCOUNTER — Inpatient Hospital Stay: Payer: Medicaid Other

## 2018-01-01 NOTE — Assessment & Plan Note (Signed)
12/14/2016: Left modified radical mastectomy: IDC grade 3, spans 5 cm and a 2.2 cm with extensive DCIS grade 3, lymphovascular invasion present, margins negative, 17/17 lymph nodes positive, ER 30%, PR 0%, HER-2 positive, Ki-67 20%, T2 N3a M0 stage IIIB AJCC 8  Patient has long-standing issues with bipolar disorder and substance abuse previously. CT CAP and Bone Scan 11/17/2016: No metastases 11 mm left lower lobe groundglass nodule probably infectious etiology   Treatment plan:  1. Adjuvant chemotherapy with TCH Perjeta every 3 weeks 6 followed by Herceptin Perjeta maintenance for1 year 3. Adjuvant radiation2/27/2019-08/01/2017 4. Followed by adjuvant antiestrogen therapy with tamoxifen  10 yearsstarted 08/21/2017 ------------------------------------------------------------------------- Current treatment:Herceptin andPerjeta; tamoxifen Echocardiogram: Ejection fraction 50% on 12/06/2016  Tamoxifen toxicities: Hot flashes and muscle cramps were present even before starting tamoxifen. She tells me that she takes Percocets because of those symptoms. I discussed with her that she needs to take nonsteroidal anti-inflammatories instead of Percocets.  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  I renewed her prescription for Percocets today.  I instructed her to take over-the-counter NSAIDs.  We do not plan to renew her Percocets any further.  Return to clinic every 3 weeks for Herceptin and Perjeta in every 6 weeks for follow-up with me. Treatment will be completed in December 2019.

## 2018-01-02 ENCOUNTER — Inpatient Hospital Stay (HOSPITAL_BASED_OUTPATIENT_CLINIC_OR_DEPARTMENT_OTHER): Payer: Medicaid Other | Admitting: Hematology and Oncology

## 2018-01-02 ENCOUNTER — Inpatient Hospital Stay: Payer: Medicaid Other

## 2018-01-02 ENCOUNTER — Other Ambulatory Visit: Payer: Self-pay | Admitting: Hematology and Oncology

## 2018-01-02 ENCOUNTER — Telehealth: Payer: Self-pay | Admitting: Hematology and Oncology

## 2018-01-02 DIAGNOSIS — Z5112 Encounter for antineoplastic immunotherapy: Secondary | ICD-10-CM | POA: Diagnosis not present

## 2018-01-02 DIAGNOSIS — C50412 Malignant neoplasm of upper-outer quadrant of left female breast: Secondary | ICD-10-CM

## 2018-01-02 DIAGNOSIS — Z170 Estrogen receptor positive status [ER+]: Secondary | ICD-10-CM | POA: Diagnosis not present

## 2018-01-02 DIAGNOSIS — M79671 Pain in right foot: Secondary | ICD-10-CM

## 2018-01-02 DIAGNOSIS — R911 Solitary pulmonary nodule: Secondary | ICD-10-CM | POA: Diagnosis not present

## 2018-01-02 DIAGNOSIS — M79672 Pain in left foot: Secondary | ICD-10-CM | POA: Diagnosis not present

## 2018-01-02 DIAGNOSIS — Z9012 Acquired absence of left breast and nipple: Secondary | ICD-10-CM

## 2018-01-02 DIAGNOSIS — Z79899 Other long term (current) drug therapy: Secondary | ICD-10-CM | POA: Diagnosis not present

## 2018-01-02 LAB — COMPREHENSIVE METABOLIC PANEL
ALT: 11 U/L (ref 0–44)
AST: 19 U/L (ref 15–41)
Albumin: 3.8 g/dL (ref 3.5–5.0)
Alkaline Phosphatase: 78 U/L (ref 38–126)
Anion gap: 10 (ref 5–15)
BUN: 18 mg/dL (ref 6–20)
CO2: 28 mmol/L (ref 22–32)
Calcium: 9.2 mg/dL (ref 8.9–10.3)
Chloride: 103 mmol/L (ref 98–111)
Creatinine, Ser: 1.1 mg/dL — ABNORMAL HIGH (ref 0.44–1.00)
GFR calc Af Amer: >60 mL/min (ref >60)
GFR calc non Af Amer: >60 mL/min (ref >60)
Glucose, Bld: 62 mg/dL — ABNORMAL LOW (ref 70–99)
Potassium: 3.9 mmol/L (ref 3.5–5.1)
Sodium: 141 mmol/L (ref 135–145)
Total Bilirubin: 0.2 mg/dL — ABNORMAL LOW (ref 0.3–1.2)
Total Protein: 7.4 g/dL (ref 6.5–8.1)

## 2018-01-02 LAB — CBC WITH DIFFERENTIAL/PLATELET
Basophils Absolute: 0.1 10*3/uL (ref 0.0–0.1)
Basophils Relative: 1 %
Eosinophils Absolute: 0.1 10*3/uL (ref 0.0–0.5)
Eosinophils Relative: 1 %
HCT: 35.4 % (ref 34.8–46.6)
Hemoglobin: 11.6 g/dL (ref 11.6–15.9)
Lymphocytes Relative: 42 %
Lymphs Abs: 2.2 10*3/uL (ref 0.9–3.3)
MCH: 32.6 pg (ref 25.1–34.0)
MCHC: 32.7 g/dL (ref 31.5–36.0)
MCV: 99.8 fL (ref 79.5–101.0)
Monocytes Absolute: 0.4 10*3/uL (ref 0.1–0.9)
Monocytes Relative: 7 %
Neutro Abs: 2.5 10*3/uL (ref 1.5–6.5)
Neutrophils Relative %: 49 %
Platelets: 257 10*3/uL (ref 145–400)
RBC: 3.55 MIL/uL — ABNORMAL LOW (ref 3.70–5.45)
RDW: 13.5 % (ref 11.2–14.5)
WBC: 5.3 10*3/uL (ref 3.9–10.3)

## 2018-01-02 MED ORDER — SODIUM CHLORIDE 0.9% FLUSH
10.0000 mL | INTRAVENOUS | Status: DC | PRN
  Administered 2018-01-02: 10 mL via INTRAVENOUS
  Filled 2018-01-02: qty 10

## 2018-01-02 MED ORDER — DIPHENHYDRAMINE HCL 25 MG PO CAPS
ORAL_CAPSULE | ORAL | Status: AC
  Filled 2018-01-02: qty 2

## 2018-01-02 MED ORDER — SODIUM CHLORIDE 0.9 % IV SOLN
Freq: Once | INTRAVENOUS | Status: AC
  Administered 2018-01-02: 11:00:00 via INTRAVENOUS
  Filled 2018-01-02: qty 250

## 2018-01-02 MED ORDER — TRASTUZUMAB CHEMO 150 MG IV SOLR
6.0000 mg/kg | Freq: Once | INTRAVENOUS | Status: AC
  Administered 2018-01-02: 252 mg via INTRAVENOUS
  Filled 2018-01-02: qty 12

## 2018-01-02 MED ORDER — OXYCODONE-ACETAMINOPHEN 5-325 MG PO TABS: 1.0000 | ORAL_TABLET | ORAL | 0 refills | Status: DC | PRN

## 2018-01-02 MED ORDER — ACETAMINOPHEN 325 MG PO TABS
650.0000 mg | ORAL_TABLET | Freq: Once | ORAL | Status: AC
  Administered 2018-01-02: 650 mg via ORAL

## 2018-01-02 MED ORDER — HEPARIN SOD (PORK) LOCK FLUSH 100 UNIT/ML IV SOLN
500.0000 [IU] | Freq: Once | INTRAVENOUS | Status: AC | PRN
  Administered 2018-01-02: 500 [IU]
  Filled 2018-01-02: qty 5

## 2018-01-02 MED ORDER — SODIUM CHLORIDE 0.9% FLUSH
10.0000 mL | INTRAVENOUS | Status: DC | PRN
  Administered 2018-01-02: 10 mL
  Filled 2018-01-02: qty 10

## 2018-01-02 MED ORDER — SODIUM CHLORIDE 0.9 % IV SOLN
420.0000 mg | Freq: Once | INTRAVENOUS | Status: AC
  Administered 2018-01-02: 420 mg via INTRAVENOUS
  Filled 2018-01-02: qty 14

## 2018-01-02 MED ORDER — ACETAMINOPHEN 325 MG PO TABS
ORAL_TABLET | ORAL | Status: AC
  Filled 2018-01-02: qty 2

## 2018-01-02 MED ORDER — DIPHENHYDRAMINE HCL 25 MG PO CAPS
50.0000 mg | ORAL_CAPSULE | Freq: Once | ORAL | Status: AC
  Administered 2018-01-02: 50 mg via ORAL

## 2018-01-02 NOTE — Progress Notes (Signed)
Patient Care Team: Department, [facility] as PCP - General Diane Skates, MD as Consulting Physician (General Surgery) Nicholas Lose, MD as Consulting Physician (Hematology and Oncology) Kyung Rudd, MD as Consulting Physician (Radiation Oncology)  DIAGNOSIS:  Encounter Diagnosis  Name Primary?  . Malignant neoplasm of upper-outer quadrant of left breast in female, estrogen receptor positive ([place])     SUMMARY OF ONCOLOGIC HISTORY:   Malignant neoplasm of upper-outer quadrant of left breast in female, estrogen receptor positive ([place])   11/01/2016 Initial Diagnosis    Palpable left breast mass with 4 enl axillary lymph nodes 4.8 x 0.8 x 3.1 cm; left axillary mass 1.1 x 0.5 cm, lymph node 1.4 x 0.8 cm biopsy grade 2-3 IDC ER 30% PR 0% HER-2 positive ratio 7.53, Ki-67 20%, T2 N2 MX stage IIIa clinical stage    12/02/2016 Genetic Testing    Negative genetic testing on the STAT panel.  The STAT Breast cancer panel offered by [facility] includes sequencing and rearrangement analysis for the following 9 genes:  ATM, BRCA1, BRCA2, CDH1, CHEK2, PALB2, PTEN, STK11 and TP53.   The report date is December 02, 2016.    12/14/2016 Surgery    Left modified radical mastectomy: IDC grade 3, spans 5 cm and a 2.2 cm with extensive DCIS grade 3, lymphovascular invasion present, margins negative, 17/17 lymph nodes positive, ER 30%, PR 0%, HER-2 positive, Ki-67 20%, T2 N3a M0 stage IIIB AJCC 8    01/10/2017 -  Chemotherapy    Adjuvant chemotherapy with TCH Perjeta 6 cycles followed by Herceptin Perjeta maintenance     06/07/2017 - 07/25/2017 Radiation Therapy    Adjuvant radiation therapy     CHIEF COMPLIANT: Herceptin Perjeta maintenance  INTERVAL HISTORY: Diane Rosario is a 42 year old with above-mentioned history of left breast cancer who underwent adjuvant chemotherapy and is now on Herceptin Perjeta maintenance.  She has missed several appointments because of her mother having had stroke.   She tells me that after she gets infusion she has a lot of cramps in the legs.  Because of this she is taking her narcotic pain medication.  We discussed previously that our goal is to get her off narcotics and use over-the-counter nonsteroidals.  She has been taking more of the nonsteroidals but requests another refill for the last time off the Percocets.  REVIEW OF SYSTEMS:   Constitutional: Denies fevers, chills or abnormal weight loss Eyes: Denies blurriness of vision Ears, nose, mouth, throat, and face: Denies mucositis or sore throat Respiratory: Denies cough, dyspnea or wheezes Cardiovascular: Denies palpitation, chest discomfort Gastrointestinal:  Denies nausea, heartburn or change in bowel habits Skin: Denies abnormal skin rashes Lymphatics: Denies new lymphadenopathy or easy bruising Neurological:Denies numbness, tingling or new weaknesses Behavioral/Psych: Mood is stable, no new changes  Extremities: No lower extremity edema   All other systems were reviewed with the patient and are negative.  I have reviewed the past medical history, past surgical history, social history and family history with the patient and they are unchanged from previous note.  ALLERGIES:  is allergic to asa [aspirin].  MEDICATIONS:  Current Outpatient Medications  Medication Sig Dispense Refill  . ALPRAZolam (XANAX) 0.5 MG tablet Take 0.5 mg by mouth 2 (two) times daily as needed for anxiety.     . ENSURE (ENSURE) Take 237 mLs by mouth daily.    Marland Kitchen gabapentin (NEURONTIN) 300 MG capsule Take 1 capsule (300 mg total) by mouth at bedtime. 90 capsule 0  . lidocaine-prilocaine (  EMLA) cream Apply to affected area once 30 g 3  . LORazepam (ATIVAN) 0.5 MG tablet Take 1 tablet (0.5 mg total) by mouth at bedtime. 30 tablet 0  . ondansetron (ZOFRAN) 8 MG tablet Take 1 tablet (8 mg total) by mouth 2 (two) times daily as needed for refractory nausea / vomiting. Start on day 3 after chemo. (Patient not taking:  Reported on 05/18/2017) 30 tablet 1  . oxyCODONE-acetaminophen (PERCOCET/ROXICET) 5-325 MG tablet Take 1 tablet by mouth every 4 (four) hours as needed for severe pain. 30 tablet 0  . prochlorperazine (COMPAZINE) 10 MG tablet Take 1 tablet (10 mg total) by mouth every 6 (six) hours as needed (Nausea or vomiting). (Patient not taking: Reported on 05/18/2017) 30 tablet 1  . risperiDONE microspheres (RISPERDAL CONSTA) 50 MG injection Inject 50 mg into the muscle every 14 (fourteen) days.    . tamoxifen (NOLVADEX) 20 MG tablet Take 1 tablet (20 mg total) by mouth 2 (two) times daily. 90 tablet 3   No current facility-administered medications for this visit.    Facility-Administered Medications Ordered in Other Visits  Medication Dose Route Frequency Provider Last Rate Last Dose  . sodium chloride flush (NS) 0.9 % injection 10 mL  10 mL Intracatheter PRN Nicholas Lose, MD        PHYSICAL EXAMINATION: ECOG PERFORMANCE STATUS: 1 - Symptomatic but completely ambulatory  Vitals:   01/02/18 0948  BP: 106/74  Pulse: 70  Resp: 17  Temp: 98.1 F (36.7 C)  SpO2: 100%   Filed Weights   01/02/18 0948  Weight: 85 lb 8 oz (38.8 kg)    GENERAL:alert, no distress and comfortable SKIN: skin color, texture, turgor are normal, no rashes or significant lesions EYES: normal, Conjunctiva are pink and non-injected, sclera clear OROPHARYNX:no exudate, no erythema and lips, buccal mucosa, and tongue normal  NECK: supple, thyroid normal size, non-tender, without nodularity LYMPH:  no palpable lymphadenopathy in the cervical, axillary or inguinal LUNGS: clear to auscultation and percussion with normal breathing effort HEART: regular rate & rhythm and no murmurs and no lower extremity edema ABDOMEN:abdomen soft, non-tender and normal bowel sounds MUSCULOSKELETAL:no cyanosis of digits and no clubbing  NEURO: alert & oriented x 3 with fluent speech, no focal motor/sensory deficits EXTREMITIES: No lower extremity  edema   LABORATORY DATA:  I have reviewed the data as listed CMP Latest Ref Rng & Units 11/21/2017 09/19/2017 08/18/2017  Glucose 70 - 99 mg/dL 105(H) 83 95  BUN 6 - 20 mg/dL 21(H) 13 14  Creatinine 0.44 - 1.00 mg/dL 1.18(H) 1.00 1.04  Sodium 135 - 145 mmol/L 140 140 139  Potassium 3.5 - 5.1 mmol/L 3.6 3.8 3.7  Chloride 98 - 111 mmol/L 102 103 105  CO2 22 - 32 mmol/L '28 28 29  '$ Calcium 8.9 - 10.3 mg/dL 9.5 9.6 9.2  Total Protein 6.5 - 8.1 g/dL 7.6 7.5 7.0  Total Bilirubin 0.3 - 1.2 mg/dL 0.4 0.4 0.3  Alkaline Phos 38 - 126 U/L 70 72 59  AST 15 - 41 U/L '21 17 17  '$ ALT 0 - 44 U/L '10 7 11    '$ Lab Results  Component Value Date   WBC 5.3 01/02/2018   HGB 11.6 01/02/2018   HCT 35.4 01/02/2018   MCV 99.8 01/02/2018   PLT 257 01/02/2018   NEUTROABS 2.5 01/02/2018    ASSESSMENT & PLAN:  Malignant neoplasm of upper-outer quadrant of left breast in female, estrogen receptor positive ([place]) 12/14/2016: Left modified radical mastectomy:  IDC grade 3, spans 5 cm and a 2.2 cm with extensive DCIS grade 3, lymphovascular invasion present, margins negative, 17/17 lymph nodes positive, ER 30%, PR 0%, HER-2 positive, Ki-67 20%, T2 N3a M0 stage IIIB AJCC 8  Patient has long-standing issues with bipolar disorder and substance abuse previously. CT CAP and Bone Scan 11/17/2016: No metastases 11 mm left lower lobe groundglass nodule probably infectious etiology   Treatment plan:  1. Adjuvant chemotherapy with TCH Perjeta every 3 weeks 6 followed by Herceptin Perjeta maintenance for1 year 3. Adjuvant radiation2/27/2019-08/01/2017 4. Followed by adjuvant antiestrogen therapy with tamoxifen  10 yearsstarted 08/21/2017 ------------------------------------------------------------------------- Current treatment:Herceptin andPerjeta; tamoxifen Echocardiogram: Ejection fraction 50% on 12/06/2016  Tamoxifen toxicities: Hot flashes and muscle cramps were present even before starting tamoxifen. She  tells me that she takes Percocets because of those symptoms. I discussed with her that she needs to take nonsteroidal anti-inflammatories instead of Percocets.  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  I renewed her prescription for Percocets today.  I instructed her to take over-the-counter NSAIDs.  We do not plan to renew her Percocets any further.  Return to clinic every 3 weeks for Herceptin and Perjeta in every 6 weeks for follow-up with me. Treatment will be completed in  January 2020   No orders of the defined types were placed in this encounter.  The patient has a good understanding of the overall plan. she agrees with it. she will call with any problems that may develop before the next visit here.   Harriette Ohara, MD 01/02/18

## 2018-01-02 NOTE — Patient Instructions (Signed)
[facility] Discharge Instructions for Patients Receiving Chemotherapy  Today you received the following chemotherapy agents :  Herceptin, Perjeta.  To help prevent nausea and vomiting after your treatment, we encourage you to take your nausea medication as prescribed.   If you develop nausea and vomiting that is not controlled by your nausea medication, call the clinic.   BELOW ARE SYMPTOMS THAT SHOULD BE REPORTED IMMEDIATELY:  *FEVER GREATER THAN 100.5 F  *CHILLS WITH OR WITHOUT FEVER  NAUSEA AND VOMITING THAT IS NOT CONTROLLED WITH YOUR NAUSEA MEDICATION  *UNUSUAL SHORTNESS OF BREATH  *UNUSUAL BRUISING OR BLEEDING  TENDERNESS IN MOUTH AND THROAT WITH OR WITHOUT PRESENCE OF ULCERS  *URINARY PROBLEMS  *BOWEL PROBLEMS  UNUSUAL RASH Items with * indicate a potential emergency and should be followed up as soon as possible.  Feel free to call the clinic should you have any questions or concerns. The clinic phone number is [phone].  Please show the CHEMO ALERT CARD at check-in to the Emergency Department and triage nurse.   

## 2018-01-02 NOTE — Telephone Encounter (Signed)
LVM for pt regarding upcoming appts  °

## 2018-01-23 ENCOUNTER — Inpatient Hospital Stay: Payer: Medicaid Other | Attending: Hematology and Oncology

## 2018-01-23 ENCOUNTER — Inpatient Hospital Stay: Payer: Medicaid Other

## 2018-01-23 ENCOUNTER — Other Ambulatory Visit: Payer: Self-pay

## 2018-01-23 VITALS — BP 89/63 | HR 80 | Temp 98.5°F | Resp 14

## 2018-01-23 DIAGNOSIS — Z5112 Encounter for antineoplastic immunotherapy: Secondary | ICD-10-CM | POA: Diagnosis present

## 2018-01-23 DIAGNOSIS — C50812 Malignant neoplasm of overlapping sites of left female breast: Secondary | ICD-10-CM

## 2018-01-23 DIAGNOSIS — Z95828 Presence of other vascular implants and grafts: Secondary | ICD-10-CM

## 2018-01-23 DIAGNOSIS — Z170 Estrogen receptor positive status [ER+]: Principal | ICD-10-CM

## 2018-01-23 DIAGNOSIS — C50412 Malignant neoplasm of upper-outer quadrant of left female breast: Secondary | ICD-10-CM | POA: Diagnosis present

## 2018-01-23 MED ORDER — SODIUM CHLORIDE 0.9 % IV SOLN
420.0000 mg | Freq: Once | INTRAVENOUS | Status: AC
  Administered 2018-01-23: 420 mg via INTRAVENOUS
  Filled 2018-01-23: qty 14

## 2018-01-23 MED ORDER — HEPARIN SOD (PORK) LOCK FLUSH 100 UNIT/ML IV SOLN
500.0000 [IU] | Freq: Once | INTRAVENOUS | Status: AC | PRN
  Administered 2018-01-23: 500 [IU]
  Filled 2018-01-23: qty 5

## 2018-01-23 MED ORDER — ACETAMINOPHEN 325 MG PO TABS
650.0000 mg | ORAL_TABLET | Freq: Once | ORAL | Status: AC
  Administered 2018-01-23: 650 mg via ORAL

## 2018-01-23 MED ORDER — SODIUM CHLORIDE 0.9% FLUSH
10.0000 mL | INTRAVENOUS | Status: DC | PRN
  Administered 2018-01-23: 10 mL
  Filled 2018-01-23: qty 10

## 2018-01-23 MED ORDER — DIPHENHYDRAMINE HCL 25 MG PO CAPS
ORAL_CAPSULE | ORAL | Status: AC
  Filled 2018-01-23: qty 2

## 2018-01-23 MED ORDER — SODIUM CHLORIDE 0.9% FLUSH
10.0000 mL | INTRAVENOUS | Status: DC | PRN
  Administered 2018-01-23: 10 mL via INTRAVENOUS
  Filled 2018-01-23: qty 10

## 2018-01-23 MED ORDER — DIPHENHYDRAMINE HCL 25 MG PO CAPS
50.0000 mg | ORAL_CAPSULE | Freq: Once | ORAL | Status: AC
  Administered 2018-01-23: 50 mg via ORAL

## 2018-01-23 MED ORDER — TRASTUZUMAB CHEMO 150 MG IV SOLR
6.0000 mg/kg | Freq: Once | INTRAVENOUS | Status: AC
  Administered 2018-01-23: 252 mg via INTRAVENOUS
  Filled 2018-01-23: qty 12

## 2018-01-23 MED ORDER — SODIUM CHLORIDE 0.9 % IV SOLN
Freq: Once | INTRAVENOUS | Status: AC
  Administered 2018-01-23: 14:00:00 via INTRAVENOUS
  Filled 2018-01-23: qty 250

## 2018-01-23 MED ORDER — ACETAMINOPHEN 325 MG PO TABS
ORAL_TABLET | ORAL | Status: AC
  Filled 2018-01-23: qty 2

## 2018-02-13 ENCOUNTER — Inpatient Hospital Stay: Payer: Medicaid Other

## 2018-02-13 ENCOUNTER — Inpatient Hospital Stay (HOSPITAL_BASED_OUTPATIENT_CLINIC_OR_DEPARTMENT_OTHER): Payer: Medicaid Other | Admitting: Hematology and Oncology

## 2018-02-13 ENCOUNTER — Inpatient Hospital Stay: Payer: Medicaid Other | Attending: Hematology and Oncology

## 2018-02-13 VITALS — BP 93/73 | HR 58 | Temp 97.9°F | Resp 17 | Ht 66.0 in | Wt 86.5 lb

## 2018-02-13 DIAGNOSIS — Z5112 Encounter for antineoplastic immunotherapy: Secondary | ICD-10-CM | POA: Diagnosis present

## 2018-02-13 DIAGNOSIS — Z95828 Presence of other vascular implants and grafts: Secondary | ICD-10-CM

## 2018-02-13 DIAGNOSIS — R911 Solitary pulmonary nodule: Secondary | ICD-10-CM | POA: Diagnosis not present

## 2018-02-13 DIAGNOSIS — Z170 Estrogen receptor positive status [ER+]: Secondary | ICD-10-CM | POA: Insufficient documentation

## 2018-02-13 DIAGNOSIS — Z79899 Other long term (current) drug therapy: Principal | ICD-10-CM

## 2018-02-13 DIAGNOSIS — C50812 Malignant neoplasm of overlapping sites of left female breast: Secondary | ICD-10-CM

## 2018-02-13 DIAGNOSIS — Z5181 Encounter for therapeutic drug level monitoring: Secondary | ICD-10-CM

## 2018-02-13 DIAGNOSIS — C50412 Malignant neoplasm of upper-outer quadrant of left female breast: Secondary | ICD-10-CM

## 2018-02-13 DIAGNOSIS — Z9012 Acquired absence of left breast and nipple: Secondary | ICD-10-CM

## 2018-02-13 DIAGNOSIS — R197 Diarrhea, unspecified: Secondary | ICD-10-CM | POA: Diagnosis not present

## 2018-02-13 LAB — CMP (CANCER CENTER ONLY)
ALT: 10 U/L (ref 0–44)
AST: 17 U/L (ref 15–41)
Albumin: 3.8 g/dL (ref 3.5–5.0)
Alkaline Phosphatase: 81 U/L (ref 38–126)
Anion gap: 9 (ref 5–15)
BUN: 13 mg/dL (ref 6–20)
CO2: 29 mmol/L (ref 22–32)
Calcium: 9.4 mg/dL (ref 8.9–10.3)
Chloride: 103 mmol/L (ref 98–111)
Creatinine: 1.05 mg/dL — ABNORMAL HIGH (ref 0.44–1.00)
GFR, Est AFR Am: >60 mL/min (ref >60)
GFR, Estimated: >60 mL/min (ref >60)
Glucose, Bld: 77 mg/dL (ref 70–99)
Potassium: 3.6 mmol/L (ref 3.5–5.1)
Sodium: 141 mmol/L (ref 135–145)
Total Bilirubin: 0.3 mg/dL (ref 0.3–1.2)
Total Protein: 7.3 g/dL (ref 6.5–8.1)

## 2018-02-13 LAB — CBC WITH DIFFERENTIAL (CANCER CENTER ONLY)
Abs Immature Granulocytes: 0 10*3/uL (ref 0.00–0.07)
Basophils Absolute: 0 10*3/uL (ref 0.0–0.1)
Basophils Relative: 0 %
Eosinophils Absolute: 0.1 10*3/uL (ref 0.0–0.5)
Eosinophils Relative: 1 %
HCT: 37.9 % (ref 36.0–46.0)
Hemoglobin: 12 g/dL (ref 12.0–15.0)
Immature Granulocytes: 0 %
Lymphocytes Relative: 48 %
Lymphs Abs: 1.9 10*3/uL (ref 0.7–4.0)
MCH: 32.6 pg (ref 26.0–34.0)
MCHC: 31.7 g/dL (ref 30.0–36.0)
MCV: 103 fL — ABNORMAL HIGH (ref 80.0–100.0)
Monocytes Absolute: 0.3 10*3/uL (ref 0.1–1.0)
Monocytes Relative: 7 %
Neutro Abs: 1.8 10*3/uL (ref 1.7–7.7)
Neutrophils Relative %: 44 %
Platelet Count: 238 10*3/uL (ref 150–400)
RBC: 3.68 MIL/uL — ABNORMAL LOW (ref 3.87–5.11)
RDW: 13.2 % (ref 11.5–15.5)
WBC Count: 4 10*3/uL (ref 4.0–10.5)
nRBC: 0 % (ref 0.0–0.2)

## 2018-02-13 MED ORDER — SODIUM CHLORIDE 0.9 % IV SOLN
420.0000 mg | Freq: Once | INTRAVENOUS | Status: AC
  Administered 2018-02-13: 420 mg via INTRAVENOUS
  Filled 2018-02-13: qty 14

## 2018-02-13 MED ORDER — DIPHENHYDRAMINE HCL 25 MG PO CAPS
ORAL_CAPSULE | ORAL | Status: AC
  Filled 2018-02-13: qty 2

## 2018-02-13 MED ORDER — SODIUM CHLORIDE 0.9 % IV SOLN
Freq: Once | INTRAVENOUS | Status: AC
  Administered 2018-02-13: 13:00:00 via INTRAVENOUS
  Filled 2018-02-13: qty 250

## 2018-02-13 MED ORDER — SODIUM CHLORIDE 0.9% FLUSH
10.0000 mL | INTRAVENOUS | Status: DC | PRN
  Administered 2018-02-13: 10 mL
  Filled 2018-02-13: qty 10

## 2018-02-13 MED ORDER — HEPARIN SOD (PORK) LOCK FLUSH 100 UNIT/ML IV SOLN
500.0000 [IU] | Freq: Once | INTRAVENOUS | Status: AC | PRN
  Administered 2018-02-13: 500 [IU]
  Filled 2018-02-13: qty 5

## 2018-02-13 MED ORDER — ACETAMINOPHEN 325 MG PO TABS
ORAL_TABLET | ORAL | Status: AC
  Filled 2018-02-13: qty 2

## 2018-02-13 MED ORDER — DIPHENHYDRAMINE HCL 25 MG PO CAPS
50.0000 mg | ORAL_CAPSULE | Freq: Once | ORAL | Status: AC
  Administered 2018-02-13: 50 mg via ORAL

## 2018-02-13 MED ORDER — TRASTUZUMAB CHEMO 150 MG IV SOLR
6.0000 mg/kg | Freq: Once | INTRAVENOUS | Status: AC
  Administered 2018-02-13: 252 mg via INTRAVENOUS
  Filled 2018-02-13: qty 12

## 2018-02-13 MED ORDER — ACETAMINOPHEN 325 MG PO TABS
650.0000 mg | ORAL_TABLET | Freq: Once | ORAL | Status: AC
  Administered 2018-02-13: 650 mg via ORAL

## 2018-02-13 MED ORDER — SODIUM CHLORIDE 0.9% FLUSH
10.0000 mL | INTRAVENOUS | Status: DC | PRN
  Administered 2018-02-13: 10 mL via INTRAVENOUS
  Filled 2018-02-13: qty 10

## 2018-02-13 NOTE — Progress Notes (Signed)
Patient Care Team: Department, [facility] as PCP - General Diane Skates, MD as Consulting Physician (General Surgery) Diane Lose, MD as Consulting Physician (Hematology and Oncology) Diane Rudd, MD as Consulting Physician (Radiation Oncology)  DIAGNOSIS:    ICD-10-CM   1. Encounter for monitoring cardiotoxic drug therapy Z51.81 ECHOCARDIOGRAM COMPLETE   Z79.899   2. Malignant neoplasm of upper-outer quadrant of left breast in female, estrogen receptor positive ([place]) C50.412    Z17.0     SUMMARY OF ONCOLOGIC HISTORY:   Malignant neoplasm of upper-outer quadrant of left breast in female, estrogen receptor positive ([place])   11/01/2016 Initial Diagnosis    Palpable left breast mass with 4 enl axillary lymph nodes 4.8 x 0.8 x 3.1 cm; left axillary mass 1.1 x 0.5 cm, lymph node 1.4 x 0.8 cm biopsy grade 2-3 IDC ER 30% PR 0% HER-2 positive ratio 7.53, Ki-67 20%, T2 N2 MX stage IIIa clinical stage    12/02/2016 Genetic Testing    Negative genetic testing on the STAT panel.  The STAT Breast cancer panel offered by [facility] includes sequencing and rearrangement analysis for the following 9 genes:  ATM, BRCA1, BRCA2, CDH1, CHEK2, PALB2, PTEN, STK11 and TP53.   The report date is December 02, 2016.    12/14/2016 Surgery    Left modified radical mastectomy: IDC grade 3, spans 5 cm and a 2.2 cm with extensive DCIS grade 3, lymphovascular invasion present, margins negative, 17/17 lymph nodes positive, ER 30%, PR 0%, HER-2 positive, Ki-67 20%, T2 N3a M0 stage IIIB AJCC 8    01/10/2017 -  Chemotherapy    Adjuvant chemotherapy with TCH Perjeta 6 cycles followed by Herceptin Perjeta maintenance     06/07/2017 - 07/25/2017 Radiation Therapy    Adjuvant radiation therapy     CHIEF COMPLIANT: Follow up of Herceptin Perjeta maintenance therapy   INTERVAL HISTORY: Diane Rosario is a 42 y.o. with above-mentioned history of left breast cancer and was treated with mastectomy and adjuvant  chemotherapy and is now on Herceptin Perjeta maintenance She notes she has been doing well. She notes having watery diarrhea 2-3 times a day, 2 days a week. She has not taken imodium, but is willing to try imodium. She notes her appetite is adequate, but her weight is still decreasing. She notes she will eat all day, but not always the best food. She drinks about 2 bottles of a water a day. Her caregiver notes wanting to maintain her electrolytes and protein.  She will get an ECHO this month.    REVIEW OF SYSTEMS:   Constitutional: Denies fevers, chills or abnormal weight loss Eyes: Denies blurriness of vision Ears, nose, mouth, throat, and face: Denies mucositis or sore throat Respiratory: Denies cough, dyspnea or wheezes Cardiovascular: Denies palpitation, chest discomfort Gastrointestinal:  Denies nausea, heartburn (+) mild to moderate diarrhea  Skin: Denies abnormal skin rashes Lymphatics: Denies new lymphadenopathy or easy bruising Neurological:Denies numbness, tingling or new weaknesses Behavioral/Psych: Mood is stable, no new changes  Extremities: No lower extremity edema Breast: Denies any pain or lumps or nodules in either breasts All other systems were reviewed with the patient and are negative.  I have reviewed the past medical history, past surgical history, social history and family history with the patient and they are unchanged from previous note.  ALLERGIES:  is allergic to asa [aspirin].  MEDICATIONS:  Current Outpatient Medications  Medication Sig Dispense Refill  . ALPRAZolam (XANAX) 0.5 MG tablet Take 0.5 mg by mouth  2 (two) times daily as needed for anxiety.     . ENSURE (ENSURE) Take 237 mLs by mouth daily.    Marland Kitchen gabapentin (NEURONTIN) 300 MG capsule Take 1 capsule (300 mg total) by mouth at bedtime. 90 capsule 0  . lidocaine-prilocaine (EMLA) cream Apply to affected area once 30 g 3  . LORazepam (ATIVAN) 0.5 MG tablet Take 1 tablet (0.5 mg total) by mouth at  bedtime. 30 tablet 0  . ondansetron (ZOFRAN) 8 MG tablet Take 1 tablet (8 mg total) by mouth 2 (two) times daily as needed for refractory nausea / vomiting. Start on day 3 after chemo. (Patient not taking: Reported on 05/18/2017) 30 tablet 1  . oxyCODONE-acetaminophen (PERCOCET/ROXICET) 5-325 MG tablet Take 1 tablet by mouth every 4 (four) hours as needed for severe pain. 30 tablet 0  . prochlorperazine (COMPAZINE) 10 MG tablet Take 1 tablet (10 mg total) by mouth every 6 (six) hours as needed (Nausea or vomiting). (Patient not taking: Reported on 05/18/2017) 30 tablet 1  . risperiDONE microspheres (RISPERDAL CONSTA) 50 MG injection Inject 50 mg into the muscle every 14 (fourteen) days.    . tamoxifen (NOLVADEX) 20 MG tablet Take 1 tablet (20 mg total) by mouth 2 (two) times daily. 90 tablet 3   No current facility-administered medications for this visit.    Facility-Administered Medications Ordered in Other Visits  Medication Dose Route Frequency Provider Last Rate Last Dose  . sodium chloride flush (NS) 0.9 % injection 10 mL  10 mL Intracatheter PRN Diane Lose, MD        PHYSICAL EXAMINATION: ECOG PERFORMANCE STATUS: 1 - Symptomatic but completely ambulatory  Vitals:   02/13/18 1144  BP: 93/73  Pulse: (!) 58  Resp: 17  Temp: 97.9 F (36.6 C)  SpO2: 100%   Filed Weights   02/13/18 1144  Weight: 86 lb 8 oz (39.2 kg)    GENERAL:alert, no distress and comfortable SKIN: skin color, texture, turgor are normal, no rashes or significant lesions EYES: normal, Conjunctiva are pink and non-injected, sclera clear OROPHARYNX:no exudate, no erythema and lips, buccal mucosa, and tongue normal  NECK: supple, thyroid normal size, non-tender, without nodularity LYMPH:  no palpable lymphadenopathy in the cervical, axillary or inguinal LUNGS: clear to auscultation and percussion with normal breathing effort HEART: regular rate & rhythm and no murmurs and no lower extremity edema ABDOMEN:abdomen  soft, non-tender and normal bowel sounds MUSCULOSKELETAL:no cyanosis of digits and no clubbing  NEURO: alert & oriented x 3 with fluent speech, no focal motor/sensory deficits EXTREMITIES: No lower extremity edema  LABORATORY DATA:  I have reviewed the data as listed CMP Latest Ref Rng & Units 01/02/2018 11/21/2017 09/19/2017  Glucose 70 - 99 mg/dL 62(L) 105(H) 83  BUN 6 - 20 mg/dL 18 21(H) 13  Creatinine 0.44 - 1.00 mg/dL 1.10(H) 1.18(H) 1.00  Sodium 135 - 145 mmol/L 141 140 140  Potassium 3.5 - 5.1 mmol/L 3.9 3.6 3.8  Chloride 98 - 111 mmol/L 103 102 103  CO2 22 - 32 mmol/L '28 28 28  '$ Calcium 8.9 - 10.3 mg/dL 9.2 9.5 9.6  Total Protein 6.5 - 8.1 g/dL 7.4 7.6 7.5  Total Bilirubin 0.3 - 1.2 mg/dL 0.2(L) 0.4 0.4  Alkaline Phos 38 - 126 U/L 78 70 72  AST 15 - 41 U/L '19 21 17  '$ ALT 0 - 44 U/L '11 10 7    '$ Lab Results  Component Value Date   WBC 4.0 02/13/2018   HGB 12.0 02/13/2018  HCT 37.9 02/13/2018   MCV 103.0 (H) 02/13/2018   PLT 238 02/13/2018   NEUTROABS 1.8 02/13/2018    ASSESSMENT & PLAN:  Malignant neoplasm of upper-outer quadrant of left breast in female, estrogen receptor positive ([place]) 12/14/2016: Left modified radical mastectomy: IDC grade 3, spans 5 cm and a 2.2 cm with extensive DCIS grade 3, lymphovascular invasion present, margins negative, 17/17 lymph nodes positive, ER 30%, PR 0%, HER-2 positive, Ki-67 20%, T2 N3a M0 stage IIIB AJCC 8  Patient has long-standing issues with bipolar disorder and substance abuse previously. CT CAP and Bone Scan 11/17/2016: No metastases 11 mm left lower lobe groundglass nodule probably infectious etiology   Treatment plan:  1. Adjuvant chemotherapy with TCH Perjeta every 3 weeks 6 followed by Herceptin Perjeta maintenance for1 year 3. Adjuvant radiation2/27/2019-08/01/2017 4. Followed by adjuvant antiestrogen therapy with tamoxifen  10 yearsstarted  08/21/2017 ------------------------------------------------------------------------- Current treatment:Herceptin andPerjeta; tamoxifen Echocardiogram: Ejection fraction 50% on 12/06/2016  Tamoxifen toxicities: Hot flashes and muscle cramps were present even before starting tamoxifen. She tells me that she takes Percocets because of those symptoms. I discussed with her that she needs to take nonsteroidal anti-inflammatories instead of Percocets.  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 Treatment will be completed in January 2020    Orders Placed This Encounter  Procedures  . ECHOCARDIOGRAM COMPLETE    Standing Status:   Future    Standing Expiration Date:   05/17/2019    Order Specific Question:   Where should this test be performed    Answer:   [facility]    Order Specific Question:   Perflutren DEFINITY (image enhancing agent) should be administered unless hypersensitivity or allergy exist    Answer:   Administer Perflutren    Order Specific Question:   Other Comments    Answer:   On Herceptin   The patient has a good understanding of the overall plan. she agrees with it. she will call with any problems that may develop before the next visit here.  Diane Lose, MD 02/13/2018  Oneal Deputy, am acting as scribe for Diane Lose, MD.  I have reviewed the above documentation for accuracy and completeness, and I agree with the above.

## 2018-02-13 NOTE — Patient Instructions (Signed)
[facility] Discharge Instructions for Patients Receiving Chemotherapy  Today you received the following chemotherapy agents :  Herceptin, Perjeta.  To help prevent nausea and vomiting after your treatment, we encourage you to take your nausea medication as prescribed.   If you develop nausea and vomiting that is not controlled by your nausea medication, call the clinic.   BELOW ARE SYMPTOMS THAT SHOULD BE REPORTED IMMEDIATELY:  *FEVER GREATER THAN 100.5 F  *CHILLS WITH OR WITHOUT FEVER  NAUSEA AND VOMITING THAT IS NOT CONTROLLED WITH YOUR NAUSEA MEDICATION  *UNUSUAL SHORTNESS OF BREATH  *UNUSUAL BRUISING OR BLEEDING  TENDERNESS IN MOUTH AND THROAT WITH OR WITHOUT PRESENCE OF ULCERS  *URINARY PROBLEMS  *BOWEL PROBLEMS  UNUSUAL RASH Items with * indicate a potential emergency and should be followed up as soon as possible.  Feel free to call the clinic should you have any questions or concerns. The clinic phone number is [phone].  Please show the CHEMO ALERT CARD at check-in to the Emergency Department and triage nurse.   

## 2018-02-13 NOTE — Assessment & Plan Note (Signed)
12/14/2016: Left modified radical mastectomy: IDC grade 3, spans 5 cm and a 2.2 cm with extensive DCIS grade 3, lymphovascular invasion present, margins negative, 17/17 lymph nodes positive, ER 30%, PR 0%, HER-2 positive, Ki-67 20%, T2 N3a M0 stage IIIB AJCC 8  Patient has long-standing issues with bipolar disorder and substance abuse previously. CT CAP and Bone Scan 11/17/2016: No metastases 11 mm left lower lobe groundglass nodule probably infectious etiology   Treatment plan:  1. Adjuvant chemotherapy with TCH Perjeta every 3 weeks 6 followed by Herceptin Perjeta maintenance for1 year 3. Adjuvant radiation2/27/2019-08/01/2017 4. Followed by adjuvant antiestrogen therapy with tamoxifen  10 yearsstarted 08/21/2017 ------------------------------------------------------------------------- Current treatment:Herceptin andPerjeta; tamoxifen Echocardiogram: Ejection fraction 50% on 12/06/2016  Tamoxifen toxicities: Hot flashes and muscle cramps were present even before starting tamoxifen. She tells me that she takes Percocets because of those symptoms. I discussed with her that she needs to take nonsteroidal anti-inflammatories instead of Percocets.  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 Treatment will be completed in January 2020

## 2018-02-14 ENCOUNTER — Telehealth: Payer: Self-pay | Admitting: Hematology and Oncology

## 2018-02-14 NOTE — Telephone Encounter (Signed)
Spoke with patient and verified appointment for Jan 2020.

## 2018-03-06 ENCOUNTER — Ambulatory Visit (HOSPITAL_COMMUNITY)
Admission: RE | Admit: 2018-03-06 | Discharge: 2018-03-06 | Disposition: A | Discharge status: Home / Self Care | Payer: Medicaid Other | Source: Ambulatory Visit | Attending: Hematology and Oncology | Admitting: Hematology and Oncology

## 2018-03-06 ENCOUNTER — Inpatient Hospital Stay: Payer: Medicaid Other

## 2018-03-06 ENCOUNTER — Other Ambulatory Visit: Payer: Self-pay

## 2018-03-06 VITALS — BP 101/78 | HR 70 | Temp 98.6°F | Resp 16

## 2018-03-06 DIAGNOSIS — F1910 Other psychoactive substance abuse, uncomplicated: Secondary | ICD-10-CM | POA: Insufficient documentation

## 2018-03-06 DIAGNOSIS — G894 Chronic pain syndrome: Secondary | ICD-10-CM

## 2018-03-06 DIAGNOSIS — Z79899 Other long term (current) drug therapy: Secondary | ICD-10-CM | POA: Diagnosis not present

## 2018-03-06 DIAGNOSIS — Z853 Personal history of malignant neoplasm of breast: Secondary | ICD-10-CM | POA: Diagnosis not present

## 2018-03-06 DIAGNOSIS — Z5181 Encounter for therapeutic drug level monitoring: Secondary | ICD-10-CM | POA: Diagnosis present

## 2018-03-06 DIAGNOSIS — C50412 Malignant neoplasm of upper-outer quadrant of left female breast: Secondary | ICD-10-CM

## 2018-03-06 DIAGNOSIS — Z5112 Encounter for antineoplastic immunotherapy: Secondary | ICD-10-CM | POA: Diagnosis not present

## 2018-03-06 DIAGNOSIS — Z170 Estrogen receptor positive status [ER+]: Secondary | ICD-10-CM

## 2018-03-06 MED ORDER — DIPHENHYDRAMINE HCL 25 MG PO CAPS
ORAL_CAPSULE | ORAL | Status: AC
  Filled 2018-03-06: qty 2

## 2018-03-06 MED ORDER — SODIUM CHLORIDE 0.9 % IV SOLN
420.0000 mg | Freq: Once | INTRAVENOUS | Status: AC
  Administered 2018-03-06: 420 mg via INTRAVENOUS
  Filled 2018-03-06: qty 14

## 2018-03-06 MED ORDER — TRASTUZUMAB CHEMO 150 MG IV SOLR
6.0000 mg/kg | Freq: Once | INTRAVENOUS | Status: AC
  Administered 2018-03-06: 252 mg via INTRAVENOUS
  Filled 2018-03-06: qty 12

## 2018-03-06 MED ORDER — HEPARIN SOD (PORK) LOCK FLUSH 100 UNIT/ML IV SOLN
500.0000 [IU] | Freq: Once | INTRAVENOUS | Status: AC | PRN
  Administered 2018-03-06: 500 [IU]
  Filled 2018-03-06: qty 5

## 2018-03-06 MED ORDER — ACETAMINOPHEN 325 MG PO TABS
650.0000 mg | ORAL_TABLET | Freq: Once | ORAL | Status: AC
  Administered 2018-03-06: 650 mg via ORAL

## 2018-03-06 MED ORDER — SODIUM CHLORIDE 0.9 % IV SOLN
Freq: Once | INTRAVENOUS | Status: AC
  Administered 2018-03-06: 14:00:00 via INTRAVENOUS
  Filled 2018-03-06: qty 250

## 2018-03-06 MED ORDER — DIPHENHYDRAMINE HCL 25 MG PO CAPS
50.0000 mg | ORAL_CAPSULE | Freq: Once | ORAL | Status: AC
  Administered 2018-03-06: 50 mg via ORAL

## 2018-03-06 MED ORDER — ACETAMINOPHEN 325 MG PO TABS
ORAL_TABLET | ORAL | Status: AC
  Filled 2018-03-06: qty 2

## 2018-03-06 MED ORDER — SODIUM CHLORIDE 0.9% FLUSH
10.0000 mL | INTRAVENOUS | Status: DC | PRN
  Administered 2018-03-06: 10 mL
  Filled 2018-03-06: qty 10

## 2018-03-06 MED ORDER — GABAPENTIN 300 MG PO CAPS: 300.0000 mg | ORAL_CAPSULE | Freq: Three times a day (TID) | ORAL | 0 refills | Status: DC

## 2018-03-06 NOTE — Progress Notes (Signed)
2D Echocardiogram has been performed.  Matilde Bash 03/06/2018, 10:47 AM

## 2018-03-06 NOTE — Telephone Encounter (Signed)
Pt came in the lobby to request more percocet refills. It has been 2-3 months since last fill. Per Dr.Gudena, pt would like for pt to try NSAIDS. Pt states that she tried them all and none works for her feet pain. Pt had gabapentin ordered. Questioned if pt still taking. Pt denies taking gabapentin since it did not work. Told pt that Dr.Gudena could increase the dose. Pt refused and wants to take percocet only. She requested that this will be the last fill and will seek help from pain specialist to manage her pain. This was the same thing she told MD the last appointment.   Per Dr.Gudena, if she does not want to try the NSAID, she can have gabapentin, increased dose to TID. Told pt what MD suggested, and pt walked off the lobby and left. Will send Gabapentin dose to pharmacy and follow up with patient next week.

## 2018-03-06 NOTE — Patient Instructions (Signed)
[facility] Discharge Instructions for Patients Receiving Chemotherapy  Today you received the following chemotherapy agents Herceptin and Perjeta.   To help prevent nausea and vomiting after your treatment, we encourage you to take your nausea medication as directed.   If you develop nausea and vomiting that is not controlled by your nausea medication, call the clinic.   BELOW ARE SYMPTOMS THAT SHOULD BE REPORTED IMMEDIATELY:  *FEVER GREATER THAN 100.5 F  *CHILLS WITH OR WITHOUT FEVER  NAUSEA AND VOMITING THAT IS NOT CONTROLLED WITH YOUR NAUSEA MEDICATION  *UNUSUAL SHORTNESS OF BREATH  *UNUSUAL BRUISING OR BLEEDING  TENDERNESS IN MOUTH AND THROAT WITH OR WITHOUT PRESENCE OF ULCERS  *URINARY PROBLEMS  *BOWEL PROBLEMS  UNUSUAL RASH Items with * indicate a potential emergency and should be followed up as soon as possible.  Feel free to call the clinic should you have any questions or concerns. The clinic phone number is [phone].  Please show the CHEMO ALERT CARD at check-in to the Emergency Department and triage nurse.   

## 2018-03-15 NOTE — Progress Notes (Signed)
Patient Care Team: Department, [facility] as PCP - Frederich Chick, MD as Consulting Physician (General Surgery) Nicholas Lose, MD as Consulting Physician (Hematology and Oncology) Kyung Rudd, MD as Consulting Physician (Radiation Oncology)  DIAGNOSIS:    ICD-10-CM   1. Malignant neoplasm of upper-outer quadrant of left breast in female, estrogen receptor positive ([place]) C50.412    Z17.0     SUMMARY OF ONCOLOGIC HISTORY:   Malignant neoplasm of upper-outer quadrant of left breast in female, estrogen receptor positive ([place])   11/01/2016 Initial Diagnosis    Palpable left breast mass with 4 enl axillary lymph nodes 4.8 x 0.8 x 3.1 cm; left axillary mass 1.1 x 0.5 cm, lymph node 1.4 x 0.8 cm biopsy grade 2-3 IDC ER 30% PR 0% HER-2 positive ratio 7.53, Ki-67 20%, T2 N2 MX stage IIIa clinical stage    12/02/2016 Genetic Testing    Negative genetic testing on the STAT panel.  The STAT Breast cancer panel offered by [facility] includes sequencing and rearrangement analysis for the following 9 genes:  ATM, BRCA1, BRCA2, CDH1, CHEK2, PALB2, PTEN, STK11 and TP53.   The report date is December 02, 2016.    12/14/2016 Surgery    Left modified radical mastectomy: IDC grade 3, spans 5 cm and a 2.2 cm with extensive DCIS grade 3, lymphovascular invasion present, margins negative, 17/17 lymph nodes positive, ER 30%, PR 0%, HER-2 positive, Ki-67 20%, T2 N3a M0 stage IIIB AJCC 8    01/10/2017 -  Chemotherapy    Adjuvant chemotherapy with TCH Perjeta 6 cycles followed by Herceptin Perjeta maintenance     06/07/2017 - 07/25/2017 Radiation Therapy    Adjuvant radiation therapy     CHIEF COMPLIANT: Follow up of Herceptin Perjeta maintenance therapy  INTERVAL HISTORY: Diane Rosario is a 42 y.o. with above-mentioned history of left breast cancer and was treated with mastectomy and adjuvant chemotherapy and is now on Herceptin Perjeta maintenance and Tamoxifen. Her most recent ECHO on  03/06/18 shows global longitudinal strain is worse than an ECHO in 11/2017 and shows an ejection fraction in the range of 60% to 65%. She presents to the clinic today alone and notes she has been having bad cramps in her legs, feet, and hands. She reports diarrhea once or twice weekly for which she takes Imodium. She denies any excessive bleeding. She noted concern about eating red meat. Her last treatment is 04/17/18.   REVIEW OF SYSTEMS:   Constitutional: Denies fevers, chills or abnormal weight loss Eyes: Denies blurriness of vision Ears, nose, mouth, throat, and face: Denies mucositis or sore throat Respiratory: Denies cough, dyspnea or wheezes Cardiovascular: Denies palpitation, chest discomfort Gastrointestinal:  Denies nausea, heartburn (+) diarrhea, 1-2x weekly Skin: Denies abnormal skin rashes MSK: (+) cramps in hands, feet, and legs Lymphatics: Denies new lymphadenopathy or easy bruising Neurological:Denies numbness, tingling or new weaknesses Behavioral/Psych: Mood is stable, no new changes  Extremities: No lower extremity edema Breast: denies any pain or lumps or nodules in either breasts All other systems were reviewed with the patient and are negative.  I have reviewed the past medical history, past surgical history, social history and family history with the patient and they are unchanged from previous note.  ALLERGIES:  is allergic to asa [aspirin].  MEDICATIONS:  Current Outpatient Medications  Medication Sig Dispense Refill  . ALPRAZolam (XANAX) 0.5 MG tablet Take 0.5 mg by mouth 2 (two) times daily as needed for anxiety.     Randall Hiss (  ENSURE) Take 237 mLs by mouth daily.    Marland Kitchen gabapentin (NEURONTIN) 300 MG capsule Take 1 capsule (300 mg total) by mouth 3 (three) times daily. 90 capsule 0  . HYDROcodone-acetaminophen (NORCO/VICODIN) 5-325 MG tablet Take 1 tablet by mouth every 6 (six) hours as needed for moderate pain. 30 tablet 0  . lidocaine-prilocaine (EMLA) cream Apply  to affected area once 30 g 3  . LORazepam (ATIVAN) 0.5 MG tablet Take 1 tablet (0.5 mg total) by mouth at bedtime. 30 tablet 0  . ondansetron (ZOFRAN) 8 MG tablet Take 1 tablet (8 mg total) by mouth 2 (two) times daily as needed for refractory nausea / vomiting. Start on day 3 after chemo. (Patient not taking: Reported on 05/18/2017) 30 tablet 1  . prochlorperazine (COMPAZINE) 10 MG tablet Take 1 tablet (10 mg total) by mouth every 6 (six) hours as needed (Nausea or vomiting). (Patient not taking: Reported on 05/18/2017) 30 tablet 1  . risperiDONE microspheres (RISPERDAL CONSTA) 50 MG injection Inject 50 mg into the muscle every 14 (fourteen) days.    . tamoxifen (NOLVADEX) 20 MG tablet Take 1 tablet (20 mg total) by mouth 2 (two) times daily. 90 tablet 3   No current facility-administered medications for this visit.    Facility-Administered Medications Ordered in Other Visits  Medication Dose Route Frequency Provider Last Rate Last Dose  . sodium chloride flush (NS) 0.9 % injection 10 mL  10 mL Intracatheter PRN Nicholas Lose, MD      . sodium chloride flush (NS) 0.9 % injection 10 mL  10 mL Intravenous PRN Nicholas Lose, MD   10 mL at 03/27/18 1137    PHYSICAL EXAMINATION: ECOG PERFORMANCE STATUS: 2 - Symptomatic, <50% confined to bed  Vitals:   03/27/18 1155  BP: 90/73  Pulse: 83  Resp: 16  Temp: 97.8 F (36.6 C)  SpO2: 100%   Filed Weights   03/27/18 1155  Weight: 87 lb 8 oz (39.7 kg)    GENERAL:alert, no distress and comfortable SKIN: skin color, texture, turgor are normal, no rashes or significant lesions EYES: normal, Conjunctiva are pink and non-injected, sclera clear OROPHARYNX:no exudate, no erythema and lips, buccal mucosa, and tongue normal  NECK: supple, thyroid normal size, non-tender, without nodularity LYMPH:  no palpable lymphadenopathy in the cervical, axillary or inguinal LUNGS: clear to auscultation and percussion with normal breathing effort HEART: regular rate  & rhythm and no murmurs and no lower extremity edema ABDOMEN:abdomen soft, non-tender and normal bowel sounds MUSCULOSKELETAL:no cyanosis of digits and no clubbing  NEURO: alert & oriented x 3 with fluent speech, no focal motor/sensory deficits EXTREMITIES: No lower extremity edema  LABORATORY DATA:  I have reviewed the data as listed CMP Latest Ref Rng & Units 02/13/2018 01/02/2018 11/21/2017  Glucose 70 - 99 mg/dL 77 62(L) 105(H)  BUN 6 - 20 mg/dL 13 18 21(H)  Creatinine 0.44 - 1.00 mg/dL 1.05(H) 1.10(H) 1.18(H)  Sodium 135 - 145 mmol/L 141 141 140  Potassium 3.5 - 5.1 mmol/L 3.6 3.9 3.6  Chloride 98 - 111 mmol/L 103 103 102  CO2 22 - 32 mmol/L _0 Calcium 8.9 - 10.3 mg/dL 9.4 9.2 9.5  Total Protein 6.5 - 8.1 g/dL 7.3 7.4 7.6  Total Bilirubin 0.3 - 1.2 mg/dL 0.3 0.2(L) 0.4  Alkaline Phos 38 - 126 U/L 81 78 70  AST 15 - 41 U/L _1 ALT 0 - 44 U/L _2 Lab Results  Component Value Date   WBC 5.6 03/27/2018   HGB 10.9 (L) 03/27/2018   HCT 34.7 (L) 03/27/2018   MCV 103.9 (H) 03/27/2018   PLT 223 03/27/2018   NEUTROABS 3.4 03/27/2018    ASSESSMENT & PLAN:  Malignant neoplasm of upper-outer quadrant of left breast in female, estrogen receptor positive ([place]) 12/14/2016: Left modified radical mastectomy: IDC grade 3, spans 5 cm and a 2.2 cm with extensive DCIS grade 3, lymphovascular invasion present, margins negative, 17/17 lymph nodes positive, ER 30%, PR 0%, HER-2 positive, Ki-67 20%, T2 N3a M0 stage IIIB AJCC 8  Patient has long-standing issues with bipolar disorder and substance abuse previously. CT CAP and Bone Scan 11/17/2016: No metastases 11 mm left lower lobe groundglass nodule probably infectious etiology   Treatment plan:  1. Adjuvant chemotherapy with TCH Perjeta every 3 weeks 6 followed by Herceptin Perjeta maintenance for1 year 3. Adjuvant radiation2/27/2019-08/01/2017 4. Followed by adjuvant antiestrogen therapy with tamoxifen  10  yearsstarted 08/21/2017 ------------------------------------------------------------------------- Current treatment:Herceptin andPerjeta; tamoxifen Echocardiogram: Ejection fraction 50% on 12/06/2016  Tamoxifen toxicities: Hot flashes and muscle cramps were present even before starting tamoxifen.  Because of severity of the cramps, I gave her another prescription for Percocets today. Once she completes with Herceptin and Perjeta, we may not have to give her anymore.  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 Treatment will be completed inJanuary 2020 I informed Dr. Dalbert Batman to remove her port in January after the last Herceptin Perjeta.   No orders of the defined types were placed in this encounter.  The patient has a good understanding of the overall plan. she agrees with it. she will call with any problems that may develop before the next visit here.  Nicholas Lose, MD 03/27/2018   I, Cloyde Reams Dorshimer, am acting as scribe for Nicholas Lose, MD.  I have reviewed the above documentation for accuracy and completeness, and I agree with the above.

## 2018-03-26 ENCOUNTER — Other Ambulatory Visit: Payer: Self-pay

## 2018-03-26 DIAGNOSIS — C50412 Malignant neoplasm of upper-outer quadrant of left female breast: Secondary | ICD-10-CM

## 2018-03-26 DIAGNOSIS — Z170 Estrogen receptor positive status [ER+]: Principal | ICD-10-CM

## 2018-03-27 ENCOUNTER — Telehealth: Payer: Self-pay | Admitting: Hematology and Oncology

## 2018-03-27 ENCOUNTER — Inpatient Hospital Stay: Payer: Medicaid Other

## 2018-03-27 ENCOUNTER — Inpatient Hospital Stay: Payer: Medicaid Other | Attending: Hematology and Oncology

## 2018-03-27 ENCOUNTER — Inpatient Hospital Stay (HOSPITAL_BASED_OUTPATIENT_CLINIC_OR_DEPARTMENT_OTHER): Payer: Medicaid Other | Admitting: Hematology and Oncology

## 2018-03-27 DIAGNOSIS — G620 Drug-induced polyneuropathy: Secondary | ICD-10-CM

## 2018-03-27 DIAGNOSIS — Z5112 Encounter for antineoplastic immunotherapy: Secondary | ICD-10-CM | POA: Insufficient documentation

## 2018-03-27 DIAGNOSIS — C50812 Malignant neoplasm of overlapping sites of left female breast: Secondary | ICD-10-CM

## 2018-03-27 DIAGNOSIS — F319 Bipolar disorder, unspecified: Secondary | ICD-10-CM | POA: Insufficient documentation

## 2018-03-27 DIAGNOSIS — Z170 Estrogen receptor positive status [ER+]: Secondary | ICD-10-CM | POA: Insufficient documentation

## 2018-03-27 DIAGNOSIS — R911 Solitary pulmonary nodule: Secondary | ICD-10-CM | POA: Diagnosis not present

## 2018-03-27 DIAGNOSIS — C50412 Malignant neoplasm of upper-outer quadrant of left female breast: Secondary | ICD-10-CM

## 2018-03-27 DIAGNOSIS — N951 Menopausal and female climacteric states: Secondary | ICD-10-CM

## 2018-03-27 DIAGNOSIS — Z95828 Presence of other vascular implants and grafts: Secondary | ICD-10-CM

## 2018-03-27 DIAGNOSIS — Z9012 Acquired absence of left breast and nipple: Secondary | ICD-10-CM

## 2018-03-27 DIAGNOSIS — R252 Cramp and spasm: Secondary | ICD-10-CM

## 2018-03-27 LAB — CBC WITH DIFFERENTIAL (CANCER CENTER ONLY)
Abs Immature Granulocytes: 0.01 10*3/uL (ref 0.00–0.07)
Basophils Absolute: 0 10*3/uL (ref 0.0–0.1)
Basophils Relative: 0 %
Eosinophils Absolute: 0 10*3/uL (ref 0.0–0.5)
Eosinophils Relative: 0 %
HCT: 34.7 % — ABNORMAL LOW (ref 36.0–46.0)
Hemoglobin: 10.9 g/dL — ABNORMAL LOW (ref 12.0–15.0)
Immature Granulocytes: 0 %
Lymphocytes Relative: 32 %
Lymphs Abs: 1.8 10*3/uL (ref 0.7–4.0)
MCH: 32.6 pg (ref 26.0–34.0)
MCHC: 31.4 g/dL (ref 30.0–36.0)
MCV: 103.9 fL — ABNORMAL HIGH (ref 80.0–100.0)
Monocytes Absolute: 0.4 10*3/uL (ref 0.1–1.0)
Monocytes Relative: 7 %
Neutro Abs: 3.4 10*3/uL (ref 1.7–7.7)
Neutrophils Relative %: 61 %
Platelet Count: 223 10*3/uL (ref 150–400)
RBC: 3.34 MIL/uL — ABNORMAL LOW (ref 3.87–5.11)
RDW: 12.7 % (ref 11.5–15.5)
WBC Count: 5.6 10*3/uL (ref 4.0–10.5)
nRBC: 0 % (ref 0.0–0.2)

## 2018-03-27 LAB — CMP (CANCER CENTER ONLY)
ALT: 12 U/L (ref 0–44)
AST: 19 U/L (ref 15–41)
Albumin: 3.9 g/dL (ref 3.5–5.0)
Alkaline Phosphatase: 76 U/L (ref 38–126)
Anion gap: 8 (ref 5–15)
BUN: 17 mg/dL (ref 6–20)
CO2: 27 mmol/L (ref 22–32)
Calcium: 9.1 mg/dL (ref 8.9–10.3)
Chloride: 108 mmol/L (ref 98–111)
Creatinine: 0.98 mg/dL (ref 0.44–1.00)
GFR, Est AFR Am: >60 mL/min (ref >60)
GFR, Estimated: >60 mL/min (ref >60)
Glucose, Bld: 80 mg/dL (ref 70–99)
Potassium: 3.8 mmol/L (ref 3.5–5.1)
Sodium: 143 mmol/L (ref 135–145)
Total Bilirubin: <0.2 mg/dL — ABNORMAL LOW (ref 0.3–1.2)
Total Protein: 7.3 g/dL (ref 6.5–8.1)

## 2018-03-27 MED ORDER — DIPHENHYDRAMINE HCL 25 MG PO CAPS
ORAL_CAPSULE | ORAL | Status: AC
  Filled 2018-03-27: qty 2

## 2018-03-27 MED ORDER — DIPHENHYDRAMINE HCL 25 MG PO CAPS
50.0000 mg | ORAL_CAPSULE | Freq: Once | ORAL | Status: AC
  Administered 2018-03-27: 50 mg via ORAL

## 2018-03-27 MED ORDER — HYDROCODONE-ACETAMINOPHEN 5-325 MG PO TABS: 1.0000 | ORAL_TABLET | Freq: Four times a day (QID) | ORAL | 0 refills | Status: DC | PRN

## 2018-03-27 MED ORDER — TRASTUZUMAB CHEMO 150 MG IV SOLR
6.0000 mg/kg | Freq: Once | INTRAVENOUS | Status: AC
  Administered 2018-03-27: 252 mg via INTRAVENOUS
  Filled 2018-03-27: qty 12

## 2018-03-27 MED ORDER — SODIUM CHLORIDE 0.9 % IV SOLN
420.0000 mg | Freq: Once | INTRAVENOUS | Status: AC
  Administered 2018-03-27: 420 mg via INTRAVENOUS
  Filled 2018-03-27: qty 14

## 2018-03-27 MED ORDER — ACETAMINOPHEN 325 MG PO TABS
650.0000 mg | ORAL_TABLET | Freq: Once | ORAL | Status: AC
  Administered 2018-03-27: 650 mg via ORAL

## 2018-03-27 MED ORDER — ACETAMINOPHEN 325 MG PO TABS
ORAL_TABLET | ORAL | Status: AC
  Filled 2018-03-27: qty 2

## 2018-03-27 MED ORDER — SODIUM CHLORIDE 0.9 % IV SOLN
Freq: Once | INTRAVENOUS | Status: AC
  Administered 2018-03-27: 13:00:00 via INTRAVENOUS
  Filled 2018-03-27: qty 250

## 2018-03-27 MED ORDER — HEPARIN SOD (PORK) LOCK FLUSH 100 UNIT/ML IV SOLN
500.0000 [IU] | Freq: Once | INTRAVENOUS | Status: AC | PRN
  Administered 2018-03-27: 500 [IU]
  Filled 2018-03-27: qty 5

## 2018-03-27 MED ORDER — SODIUM CHLORIDE 0.9% FLUSH
10.0000 mL | INTRAVENOUS | Status: DC | PRN
  Administered 2018-03-27: 10 mL via INTRAVENOUS
  Filled 2018-03-27: qty 10

## 2018-03-27 MED ORDER — SODIUM CHLORIDE 0.9% FLUSH
10.0000 mL | INTRAVENOUS | Status: DC | PRN
  Administered 2018-03-27: 10 mL
  Filled 2018-03-27: qty 10

## 2018-03-27 NOTE — Telephone Encounter (Signed)
Gave avs and calendar ° °

## 2018-03-27 NOTE — Patient Instructions (Signed)
[facility] Discharge Instructions for Patients Receiving Chemotherapy  Today you received the following chemotherapy agents: Herceptin, Perjeta  To help prevent nausea and vomiting after your treatment, we encourage you to take your nausea medication as directed.   If you develop nausea and vomiting that is not controlled by your nausea medication, call the clinic.   BELOW ARE SYMPTOMS THAT SHOULD BE REPORTED IMMEDIATELY:  *FEVER GREATER THAN 100.5 F  *CHILLS WITH OR WITHOUT FEVER  NAUSEA AND VOMITING THAT IS NOT CONTROLLED WITH YOUR NAUSEA MEDICATION  *UNUSUAL SHORTNESS OF BREATH  *UNUSUAL BRUISING OR BLEEDING  TENDERNESS IN MOUTH AND THROAT WITH OR WITHOUT PRESENCE OF ULCERS  *URINARY PROBLEMS  *BOWEL PROBLEMS  UNUSUAL RASH Items with * indicate a potential emergency and should be followed up as soon as possible.  Feel free to call the clinic should you have any questions or concerns. The clinic phone number is [phone].  Please show the CHEMO ALERT CARD at check-in to the Emergency Department and triage nurse.   

## 2018-03-27 NOTE — Assessment & Plan Note (Signed)
12/14/2016: Left modified radical mastectomy: IDC grade 3, spans 5 cm and a 2.2 cm with extensive DCIS grade 3, lymphovascular invasion present, margins negative, 17/17 lymph nodes positive, ER 30%, PR 0%, HER-2 positive, Ki-67 20%, T2 N3a M0 stage IIIB AJCC 8  Patient has long-standing issues with bipolar disorder and substance abuse previously. CT CAP and Bone Scan 11/17/2016: No metastases 11 mm left lower lobe groundglass nodule probably infectious etiology   Treatment plan:  1. Adjuvant chemotherapy with TCH Perjeta every 3 weeks 6 followed by Herceptin Perjeta maintenance for1 year 3. Adjuvant radiation2/27/2019-08/01/2017 4. Followed by adjuvant antiestrogen therapy with tamoxifen  10 yearsstarted 08/21/2017 ------------------------------------------------------------------------- Current treatment:Herceptin andPerjeta; tamoxifen Echocardiogram: Ejection fraction 50% on 12/06/2016  Tamoxifen toxicities: Hot flashes and muscle cramps were present even before starting tamoxifen.   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 Treatment will be completed inJanuary 2020

## 2018-04-17 ENCOUNTER — Inpatient Hospital Stay: Payer: Medicaid Other

## 2018-04-17 ENCOUNTER — Inpatient Hospital Stay: Payer: Medicaid Other | Admitting: Hematology and Oncology

## 2018-04-17 ENCOUNTER — Telehealth: Payer: Self-pay | Admitting: *Deleted

## 2018-04-17 ENCOUNTER — Encounter: Payer: Self-pay | Admitting: *Deleted

## 2018-04-17 NOTE — Assessment & Plan Note (Signed)
12/14/2016: Left modified radical mastectomy: IDC grade 3, spans 5 cm and a 2.2 cm with extensive DCIS grade 3, lymphovascular invasion present, margins negative, 17/17 lymph nodes positive, ER 30%, PR 0%, HER-2 positive, Ki-67 20%, T2 N3a M0 stage IIIB AJCC 8  Patient has long-standing issues with bipolar disorder and substance abuse previously. CT CAP and Bone Scan 11/17/2016: No metastases 11 mm left lower lobe groundglass nodule probably infectious etiology   Treatment plan:  1. Adjuvant chemotherapy with TCH Perjeta every 3 weeks 6 followed by Herceptin Perjeta maintenance for1 year 3. Adjuvant radiation2/27/2019-08/01/2017 4. 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

## 2018-04-17 NOTE — Telephone Encounter (Signed)
Per Dr. Lindi Adie, pt to forgo final herceptin/perjeta and have port removed. Called pt and discussed plan. Received verbal understanding.

## 2018-04-19 ENCOUNTER — Telehealth: Payer: Self-pay | Admitting: Hematology and Oncology

## 2018-04-19 NOTE — Telephone Encounter (Signed)
Per 1/7 sch message -   I rescheduled the patient for 1/22 and called the patient - that's the next available that I could align an appt in the treatment area with a follow up with Dr. Lindi Adie .  Patient says she is getting her port taken out on 1/21 - and said she had spoke with someone who said not to worry about her next treatment - msg sent to RN/MD/Navigator

## 2018-04-26 ENCOUNTER — Telehealth: Payer: Self-pay | Admitting: Hematology and Oncology

## 2018-04-26 NOTE — Telephone Encounter (Signed)
Confirmed appt on 1/20 with patient - per patient does not want treatment just lab and f/u

## 2018-04-30 ENCOUNTER — Other Ambulatory Visit: Payer: Medicaid Other

## 2018-04-30 ENCOUNTER — Ambulatory Visit: Payer: Medicaid Other | Admitting: Hematology and Oncology

## 2018-04-30 NOTE — Assessment & Plan Note (Signed)
12/14/2016: Left modified radical mastectomy: IDC grade 3, spans 5 cm and a 2.2 cm with extensive DCIS grade 3, lymphovascular invasion present, margins negative, 17/17 lymph nodes positive, ER 30%, PR 0%, HER-2 positive, Ki-67 20%, T2 N3a M0 stage IIIB AJCC 8  Patient has long-standing issues with bipolar disorder and substance abuse previously. CT CAP and Bone Scan 11/17/2016: No metastases 11 mm left lower lobe groundglass nodule probably infectious etiology   Treatment plan:  1. Adjuvant chemotherapy with TCH Perjeta every 3 weeks 6 followed by Herceptin Perjeta maintenance for1 year 3. Adjuvant radiation2/27/2019-08/01/2017 4. Followed by adjuvant antiestrogen therapy with tamoxifen  10 yearsstarted 08/21/2017 ------------------------------------------------------------------------- Current treatment: Tamoxifen 20 mg daily Tamoxifen toxicities: Hot flashes and muscle cramps were present even before starting tamoxifen.   Breast cancer surveillance: Right breast mammogram will need to be scheduled. Return to clinic in 6 months for follow-up and after that we can see her once a year.

## 2018-05-02 ENCOUNTER — Other Ambulatory Visit: Payer: Medicaid Other

## 2018-05-02 ENCOUNTER — Ambulatory Visit: Payer: Medicaid Other

## 2018-05-02 ENCOUNTER — Ambulatory Visit: Payer: Medicaid Other | Admitting: Hematology and Oncology

## 2018-05-09 ENCOUNTER — Telehealth: Payer: Self-pay

## 2018-05-09 NOTE — Telephone Encounter (Signed)
Spoke with patient reminding of SCP visit with NP on 05/15/18 at 10 am.  Patient said she will come to appt.

## 2018-05-15 ENCOUNTER — Inpatient Hospital Stay: Payer: Medicaid Other | Attending: Hematology and Oncology | Admitting: Adult Health

## 2018-05-15 ENCOUNTER — Encounter: Payer: Self-pay | Admitting: Adult Health

## 2019-08-08 ENCOUNTER — Encounter (HOSPITAL_COMMUNITY): Payer: Self-pay

## 2019-08-08 ENCOUNTER — Ambulatory Visit (HOSPITAL_COMMUNITY)
Admission: EM | Admit: 2019-08-08 | Discharge: 2019-08-08 | Disposition: A | Discharge status: Home / Self Care | Payer: Medicaid Other | Attending: Family Medicine | Admitting: Family Medicine

## 2019-08-08 ENCOUNTER — Other Ambulatory Visit: Payer: Self-pay

## 2019-08-08 DIAGNOSIS — R519 Headache, unspecified: Secondary | ICD-10-CM

## 2019-08-08 MED ORDER — KETOROLAC TROMETHAMINE 30 MG/ML IJ SOLN
30.0000 mg | Freq: Once | INTRAMUSCULAR | Status: AC
  Administered 2019-08-08: 30 mg via INTRAMUSCULAR

## 2019-08-08 MED ORDER — KETOROLAC TROMETHAMINE 30 MG/ML IJ SOLN
INTRAMUSCULAR | Status: AC
  Filled 2019-08-08: qty 1

## 2019-08-08 NOTE — ED Provider Notes (Signed)
[facility]   [id number] 08/08/19 Arrival Time: 35  ASSESSMENT & PLAN:  1. Intractable headache, unspecified chronicity pattern, unspecified headache type      Meds ordered this encounter  Medications  . ketorolac (TORADOL) 30 MG/ML injection 30 mg    Normal neurological exam. Afebrile without nuchal rigidity. Discussed. Current presentation and symptoms are not consistent SAH, ICH, meningitis, or temporal arteritis. Without fever, focal neuro logical deficits, nuchal rigidity, or change in vision. No indication for urgent neurodiagnostic workup at this time. Discussed.  Discussed risks of smoking crack cocaine.   Recommend: Follow-up Information    [facility].   Specialty: Emergency Medicine Why: If symptoms worsen in any way. Contact information: [address] [id number] [place] [phone]           Reviewed expectations re: course of current medical issues. Questions answered. Outlined signs and symptoms indicating need for more acute intervention. Patient verbalized understanding. After Visit Summary given.   SUBJECTIVE: History from: Patient Patient is able to give a clear and coherent history.  MOE ROHLFS is a 44 y.o. female who presents with complaint of a R-sided temporal headache. Onset gradual, approx 3 w ago. History of headaches: "occasional". Precipitating factors include: none which have been determined. Associated symptoms: Preceding aura: no. Nausea/vomiting: no. Vision changes: no. Increased sensitivity to light and to noises: mild. Fever: no. Sinus pressure/congestion: no. Extremity weakness: no. Goody's powders with temporary relief. Current headache has not limited normal daily activities. Denies dizziness, loss of balance, muscle weakness, numbness of extremities and speech difficulties. No head injury reported. Ambulatory without  difficulty. No recent travel.  Does smoke THC and crack cocaine (approx 2x/week). Last use yesterday.   OBJECTIVE:  Vitals:   08/08/19 1755  BP: 101/76  Pulse: (!) 101  Resp: 18  Temp: 98.7 F (37.1 C)  TempSrc: Oral  SpO2: 100%    Recheck pulse: 94 and regular  General appearance: alert; NAD; speaks quietly HENT: normocephalic; atraumatic Eyes: PERRLA; EOMI; conjunctivae normal Neck: supple with FROM Lungs: clear to auscultation bilaterally; unlabored respirations Heart: regular rate and rhythm Extremities: no edema; symmetrical with no gross deformities Skin: warm and dry Neurologic: alert; speech is fluent and clear without dysarthria or aphasia; CN 2-12 grossly intact; no facial droop; normal gait; normal symmetric reflexes; normal extremity strength and sensation throughout; bilateral upper and lower extremity sensation is grossly intact with 5/5 symmetric strength; normal grip strength Psychological: alert and cooperative; normal mood and affect   Allergies  Allergen Reactions  . Asa [Aspirin] Hives    Past Medical History:  Diagnosis Date  . Anxiety   . Bipolar 1 disorder ([place])    pt reports bipolar, pt reports Auditory hallucinations  . COPD (chronic obstructive pulmonary disease) ([place])   . Depression   . Malignant neoplasm of upper-outer quadrant of left breast in female, estrogen receptor positive ([place]) 11/03/2016  . PTSD (post-traumatic stress disorder)    husband was murdered   Social History   Socioeconomic History  . Marital status: Widowed    Spouse name: Not on file  . Number of children: Not on file  . Years of education: Not on file  . Highest education level: Not on file  Occupational History  . Not on file  Tobacco Use  . Smoking status: Current Every Day Smoker    Packs/day: 0.25    Years: 10.00    Pack years: 2.50    Types:  Cigarettes  . Smokeless tobacco: Never Used  Substance and Sexual Activity  . Alcohol use: Yes    Comment:  occasionally; 1 per week   . Drug use: Yes    Types: Marijuana, Cocaine    Comment: 1 month since use of marijuana ; uses cocaine at least 1-2 times per weekl ; say s trying to wean off  . Sexual activity: Yes  Other Topics Concern  . Not on file  Social History Narrative  . Not on file   Social Determinants of Health   Financial Resource Strain:   . Difficulty of Paying Living Expenses:   Food Insecurity:   . Worried About Charity fundraiser in the Last Year:   . Arboriculturist in the Last Year:   Transportation Needs:   . Film/video editor (Medical):   Marland Kitchen Lack of Transportation (Non-Medical):   Physical Activity:   . Days of Exercise per Week:   . Minutes of Exercise per Session:   Stress:   . Feeling of Stress :   Social Connections:   . Frequency of Communication with Friends and Family:   . Frequency of Social Gatherings with Friends and Family:   . Attends Religious Services:   . Active Member of Clubs or Organizations:   . Attends Archivist Meetings:   Marland Kitchen Marital Status:   Intimate Partner Violence:   . Fear of Current or Ex-Partner:   . Emotionally Abused:   Marland Kitchen Physically Abused:   . Sexually Abused:    Family History  Problem Relation Age of Onset  . Hypertension Maternal Aunt   . Hypertension Maternal Grandmother   . Cirrhosis Father   . Cancer Father        unknown type   Past Surgical History:  Procedure Laterality Date  . EYE SURGERY     both eyes ; correct weak eye muscles   . MASTECTOMY MODIFIED RADICAL Left 12/14/2016   Procedure: LEFT MODIFIED RADICAL MASTECTOMY WITH ERAS PATHWAY;  Surgeon: [name], MD;  Location: [facility];  Service: General;  Laterality: Left;  . PORTACATH PLACEMENT Right 12/14/2016   Procedure: INSERTION PORT-A-CATH WITH ULTRASOUND;  Surgeon: [name], MD;  Location: [facility];  Service: General;  Laterality: Right;     Vanessa Kick, MD 08/08/19 1816

## 2019-08-08 NOTE — ED Triage Notes (Signed)
Pt presents to UC with headaches x 3 weeks. Pt did not take any medication fop the headaches.

## 2019-08-19 ENCOUNTER — Encounter (HOSPITAL_COMMUNITY): Payer: Self-pay

## 2019-08-19 ENCOUNTER — Other Ambulatory Visit: Payer: Self-pay

## 2019-08-19 ENCOUNTER — Emergency Department (HOSPITAL_COMMUNITY): Payer: Medicaid Other

## 2019-08-19 ENCOUNTER — Emergency Department (HOSPITAL_COMMUNITY)
Admission: EM | Admit: 2019-08-19 | Discharge: 2019-08-19 | Disposition: A | Discharge status: Home / Self Care | Payer: Medicaid Other | Attending: Emergency Medicine | Admitting: Emergency Medicine

## 2019-08-19 DIAGNOSIS — H538 Other visual disturbances: Secondary | ICD-10-CM | POA: Insufficient documentation

## 2019-08-19 DIAGNOSIS — R519 Headache, unspecified: Secondary | ICD-10-CM | POA: Insufficient documentation

## 2019-08-19 DIAGNOSIS — Z5321 Procedure and treatment not carried out due to patient leaving prior to being seen by health care provider: Secondary | ICD-10-CM | POA: Diagnosis not present

## 2019-08-19 DIAGNOSIS — R42 Dizziness and giddiness: Secondary | ICD-10-CM | POA: Insufficient documentation

## 2019-08-19 IMAGING — CR DG CHEST 2V
2 series · 2 of 2 positions shown · non-contrast
Comparison: [DATE]

CLINICAL DATA: Tachycardia.

EXAM:
CHEST - 2 VIEW

[w chest lat]
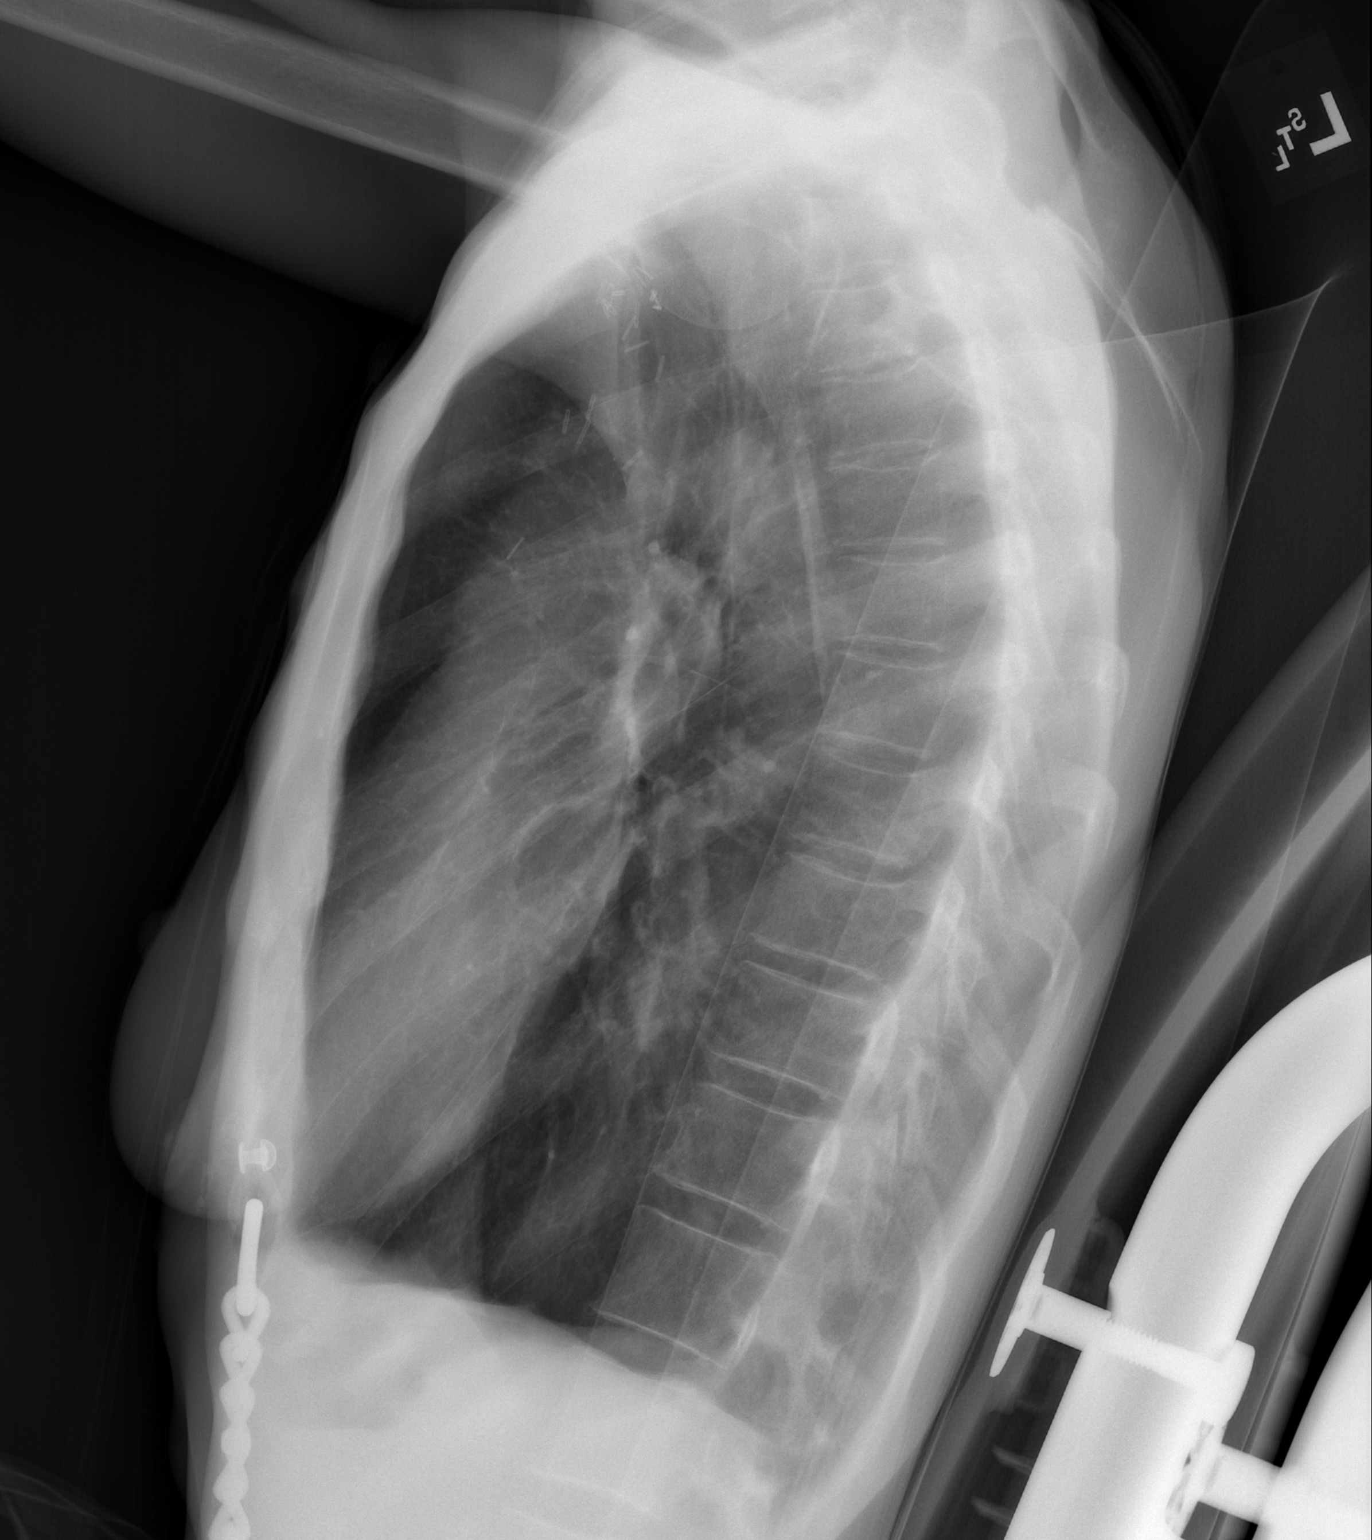

[chest ap]
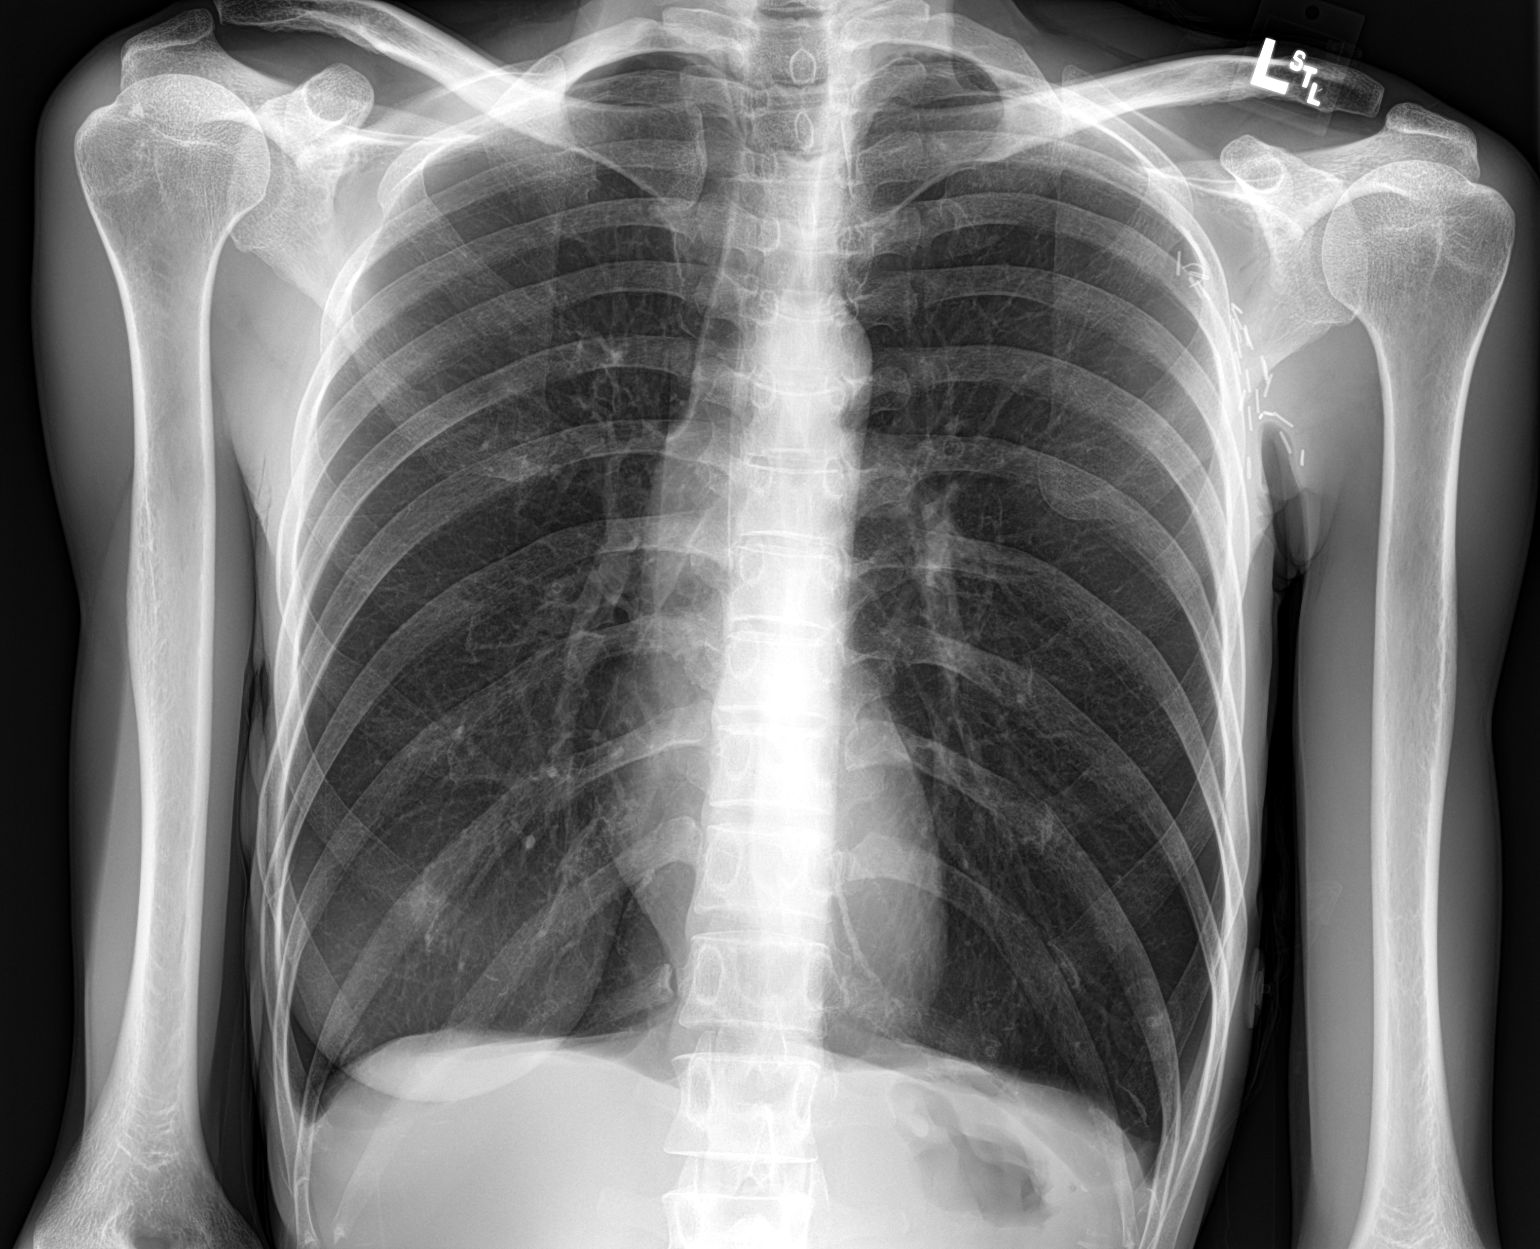

[2 of 2 positions shown; findings below may reference images not displayed]

FINDINGS: The previously demonstrated right-sided Port-A-Cath has been
removed. The heart size is stable. There is a small pulmonary
opacity projecting over the right lower lung zone. There is an
additional opacity projecting over the posterior tenth rib on the
left. There is no pneumothorax. No large pleural effusion. There is
no acute osseous abnormality. The lungs are hyperexpanded.
IMPRESSION: 1. No definite acute cardiopulmonary process.
2. Bilateral pulmonary opacities as detailed above. The opacity on
the patient's right is favored to represent a nipple shadow, while
the left opacity may represent a focal infiltrate or area of
atelectasis. A two-view follow-up chest x-ray is recommended in 4-6
weeks to confirm stability or resolution of both of these opacities.
3. Hyperexpanded lungs which can be seen in patients with COPD.

## 2019-08-19 MED ORDER — SODIUM CHLORIDE 0.9% FLUSH: 3.0000 mL | Freq: Once | INTRAVENOUS | Status: DC

## 2019-08-19 NOTE — ED Triage Notes (Signed)
Patient c/o intermittent headache, dizziness, and blurred vision x 3 weeks.

## 2019-08-19 NOTE — ED Notes (Signed)
Pt eloped from waiting room. Called 3X for blood draw and vital signs.

## 2019-09-02 ENCOUNTER — Emergency Department (HOSPITAL_COMMUNITY): Payer: Medicaid Other

## 2019-09-02 ENCOUNTER — Encounter (HOSPITAL_COMMUNITY): Payer: Self-pay

## 2019-09-02 ENCOUNTER — Inpatient Hospital Stay (HOSPITAL_COMMUNITY)
Admission: EM | Admit: 2019-09-02 | Discharge: 2019-09-07 | DRG: 025 | Disposition: A | Discharge status: Home / Self Care | Payer: Medicaid Other | Attending: Internal Medicine | Admitting: Internal Medicine

## 2019-09-02 ENCOUNTER — Other Ambulatory Visit: Payer: Self-pay

## 2019-09-02 DIAGNOSIS — Z79899 Other long term (current) drug therapy: Secondary | ICD-10-CM

## 2019-09-02 DIAGNOSIS — F17210 Nicotine dependence, cigarettes, uncomplicated: Secondary | ICD-10-CM | POA: Diagnosis present

## 2019-09-02 DIAGNOSIS — G620 Drug-induced polyneuropathy: Secondary | ICD-10-CM | POA: Diagnosis present

## 2019-09-02 DIAGNOSIS — Z9012 Acquired absence of left breast and nipple: Secondary | ICD-10-CM

## 2019-09-02 DIAGNOSIS — C7931 Secondary malignant neoplasm of brain: Principal | ICD-10-CM | POA: Diagnosis present

## 2019-09-02 DIAGNOSIS — Z20822 Contact with and (suspected) exposure to covid-19: Secondary | ICD-10-CM | POA: Diagnosis present

## 2019-09-02 DIAGNOSIS — F319 Bipolar disorder, unspecified: Secondary | ICD-10-CM | POA: Diagnosis present

## 2019-09-02 DIAGNOSIS — F1410 Cocaine abuse, uncomplicated: Secondary | ICD-10-CM | POA: Diagnosis present

## 2019-09-02 DIAGNOSIS — J449 Chronic obstructive pulmonary disease, unspecified: Secondary | ICD-10-CM | POA: Diagnosis present

## 2019-09-02 DIAGNOSIS — Z886 Allergy status to analgesic agent status: Secondary | ICD-10-CM

## 2019-09-02 DIAGNOSIS — R739 Hyperglycemia, unspecified: Secondary | ICD-10-CM | POA: Diagnosis present

## 2019-09-02 DIAGNOSIS — R440 Auditory hallucinations: Secondary | ICD-10-CM | POA: Diagnosis present

## 2019-09-02 DIAGNOSIS — Z79810 Long term (current) use of selective estrogen receptor modulators (SERMs): Secondary | ICD-10-CM

## 2019-09-02 DIAGNOSIS — R270 Ataxia, unspecified: Secondary | ICD-10-CM | POA: Diagnosis present

## 2019-09-02 DIAGNOSIS — N390 Urinary tract infection, site not specified: Secondary | ICD-10-CM

## 2019-09-02 DIAGNOSIS — D496 Neoplasm of unspecified behavior of brain: Secondary | ICD-10-CM | POA: Diagnosis present

## 2019-09-02 DIAGNOSIS — C50412 Malignant neoplasm of upper-outer quadrant of left female breast: Secondary | ICD-10-CM

## 2019-09-02 DIAGNOSIS — F1290 Cannabis use, unspecified, uncomplicated: Secondary | ICD-10-CM | POA: Diagnosis present

## 2019-09-02 DIAGNOSIS — Z170 Estrogen receptor positive status [ER+]: Secondary | ICD-10-CM

## 2019-09-02 DIAGNOSIS — G936 Cerebral edema: Secondary | ICD-10-CM | POA: Diagnosis present

## 2019-09-02 DIAGNOSIS — Z9221 Personal history of antineoplastic chemotherapy: Secondary | ICD-10-CM

## 2019-09-02 DIAGNOSIS — T451X5A Adverse effect of antineoplastic and immunosuppressive drugs, initial encounter: Secondary | ICD-10-CM | POA: Diagnosis present

## 2019-09-02 DIAGNOSIS — R64 Cachexia: Secondary | ICD-10-CM | POA: Diagnosis present

## 2019-09-02 DIAGNOSIS — F4310 Post-traumatic stress disorder, unspecified: Secondary | ICD-10-CM | POA: Diagnosis present

## 2019-09-02 DIAGNOSIS — Z681 Body mass index (BMI) 19 or less, adult: Secondary | ICD-10-CM

## 2019-09-02 DIAGNOSIS — Z853 Personal history of malignant neoplasm of breast: Secondary | ICD-10-CM

## 2019-09-02 DIAGNOSIS — G911 Obstructive hydrocephalus: Secondary | ICD-10-CM | POA: Diagnosis present

## 2019-09-02 DIAGNOSIS — R296 Repeated falls: Secondary | ICD-10-CM | POA: Diagnosis present

## 2019-09-02 DIAGNOSIS — G9389 Other specified disorders of brain: Secondary | ICD-10-CM

## 2019-09-02 DIAGNOSIS — R627 Adult failure to thrive: Secondary | ICD-10-CM

## 2019-09-02 DIAGNOSIS — Z452 Encounter for adjustment and management of vascular access device: Secondary | ICD-10-CM

## 2019-09-02 DIAGNOSIS — Z809 Family history of malignant neoplasm, unspecified: Secondary | ICD-10-CM

## 2019-09-02 DIAGNOSIS — Z923 Personal history of irradiation: Secondary | ICD-10-CM

## 2019-09-02 DIAGNOSIS — E43 Unspecified severe protein-calorie malnutrition: Secondary | ICD-10-CM | POA: Diagnosis present

## 2019-09-02 DIAGNOSIS — Z8249 Family history of ischemic heart disease and other diseases of the circulatory system: Secondary | ICD-10-CM

## 2019-09-02 LAB — CBC WITH DIFFERENTIAL/PLATELET
Abs Immature Granulocytes: 0.01 10*3/uL (ref 0.00–0.07)
Basophils Absolute: 0 10*3/uL (ref 0.0–0.1)
Basophils Relative: 0 %
Eosinophils Absolute: 0 10*3/uL (ref 0.0–0.5)
Eosinophils Relative: 0 %
HCT: 45.2 % (ref 36.0–46.0)
Hemoglobin: 14.7 g/dL (ref 12.0–15.0)
Immature Granulocytes: 0 %
Lymphocytes Relative: 30 %
Lymphs Abs: 2.4 10*3/uL (ref 0.7–4.0)
MCH: 33.3 pg (ref 26.0–34.0)
MCHC: 32.5 g/dL (ref 30.0–36.0)
MCV: 102.3 fL — ABNORMAL HIGH (ref 80.0–100.0)
Monocytes Absolute: 0.6 10*3/uL (ref 0.1–1.0)
Monocytes Relative: 8 %
Neutro Abs: 4.7 10*3/uL (ref 1.7–7.7)
Neutrophils Relative %: 62 %
Platelets: 239 10*3/uL (ref 150–400)
RBC: 4.42 MIL/uL (ref 3.87–5.11)
RDW: 11.6 % (ref 11.5–15.5)
WBC: 7.8 10*3/uL (ref 4.0–10.5)
nRBC: 0 % (ref 0.0–0.2)

## 2019-09-02 LAB — URINALYSIS, ROUTINE W REFLEX MICROSCOPIC
Bilirubin Urine: NEGATIVE
Glucose, UA: NEGATIVE mg/dL
Ketones, ur: NEGATIVE mg/dL
Nitrite: POSITIVE — AB
Protein, ur: 100 mg/dL — AB
RBC / HPF: >50 RBC/hpf — ABNORMAL HIGH (ref 0–5)
Specific Gravity, Urine: 1.035 — ABNORMAL HIGH (ref 1.005–1.030)
WBC, UA: >50 WBC/hpf — ABNORMAL HIGH (ref 0–5)
pH: 5 (ref 5.0–8.0)

## 2019-09-02 LAB — COMPREHENSIVE METABOLIC PANEL
ALT: 18 U/L (ref 0–44)
AST: 34 U/L (ref 15–41)
Albumin: 4.4 g/dL (ref 3.5–5.0)
Alkaline Phosphatase: 56 U/L (ref 38–126)
Anion gap: 12 (ref 5–15)
BUN: 23 mg/dL — ABNORMAL HIGH (ref 6–20)
CO2: 26 mmol/L (ref 22–32)
Calcium: 9.4 mg/dL (ref 8.9–10.3)
Chloride: 101 mmol/L (ref 98–111)
Creatinine, Ser: 0.88 mg/dL (ref 0.44–1.00)
GFR calc Af Amer: >60 mL/min (ref >60)
GFR calc non Af Amer: >60 mL/min (ref >60)
Glucose, Bld: 104 mg/dL — ABNORMAL HIGH (ref 70–99)
Potassium: 4.2 mmol/L (ref 3.5–5.1)
Sodium: 139 mmol/L (ref 135–145)
Total Bilirubin: 0.4 mg/dL (ref 0.3–1.2)
Total Protein: 7.8 g/dL (ref 6.5–8.1)

## 2019-09-02 LAB — VITAMIN B12: Vitamin B-12: 417 pg/mL (ref 180–914)

## 2019-09-02 IMAGING — MR MR HEAD WO/W CM
15 series · 48 of 48 positions shown · IV contrast (gadavist)
Comparison: None.

CLINICAL DATA: Dizziness for 3 weeks. History of breast carcinoma.

EXAM:
MRI HEAD WITHOUT AND WITH CONTRAST
TECHNIQUE: Multiplanar, multiecho pulse sequences of the brain and surrounding
structures were obtained without and with intravenous contrast.
CONTRAST:  3.5mL GADAVIST GADOBUTROL 1 MMOL/ML IV SOLN

[Series 5: DWI · axial · 3.0mm · 1.25mm/px · z∈[-98,+52]mm · 10 of 103 slices shown (1 of 4)]
[im 1/103]
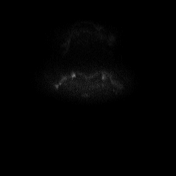
[im 12/103]
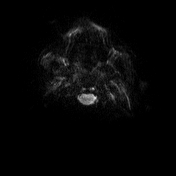
[im 23/103]
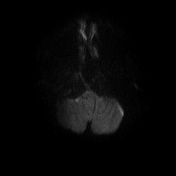
[im 35/103]
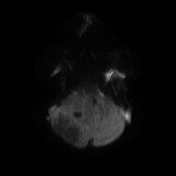
[im 46/103]
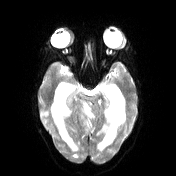
[im 57/103]
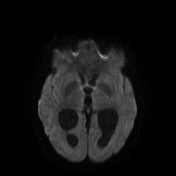
[im 69/103]
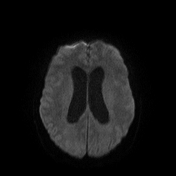
[im 80/103]
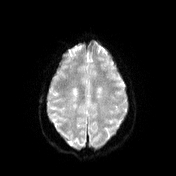
[im 91/103]
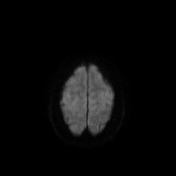
[im 103/103]
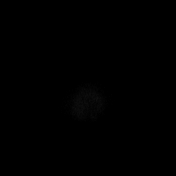

[Series 6: DWI · axial · 3.0mm · 1.25mm/px · z∈[-98,+52]mm · 4 of 52 slices shown (2 of 4)]
[im 1/52]
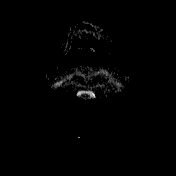
[im 18/52]
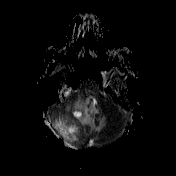
[im 35/52]
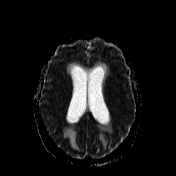
[im 52/52]
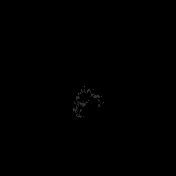

[Series 7: DWI · coronal · 5.0mm · 1.25mm/px · 6 of 68 slices shown (3 of 4)]
[im 1/68]
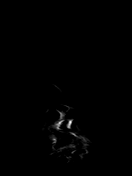
[im 14/68]
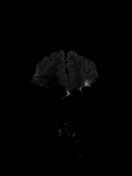
[im 27/68]
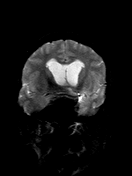
[im 41/68]
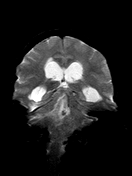
[im 54/68]
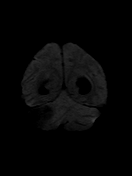
[im 68/68]
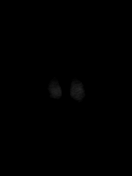

[Series 8: DWI · coronal · 5.0mm · 1.25mm/px · 3 of 34 slices shown (4 of 4)]
[im 1/34]
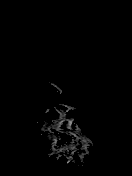
[im 17/34]
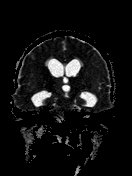
[im 34/34]
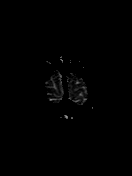

[Series 9: T1 · sagittal · 5.0mm · 0.69mm/px · 2 of 22 slices shown (1 of 2)]
[im 1/22]
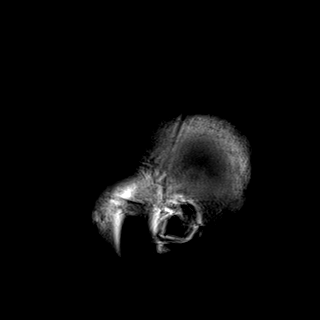
[im 22/22]
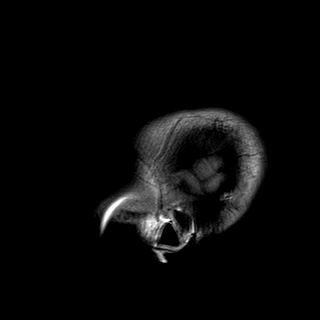

[Series 10: T2 · axial · 5.0mm · 0.57mm/px · z∈[-96,+50]mm · 2 of 24 slices shown (1 of 2)]
[im 1/24]
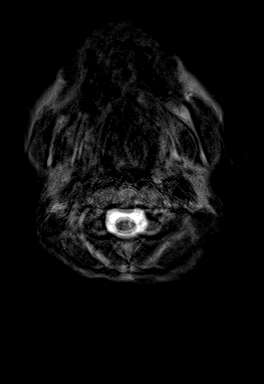
[im 24/24]
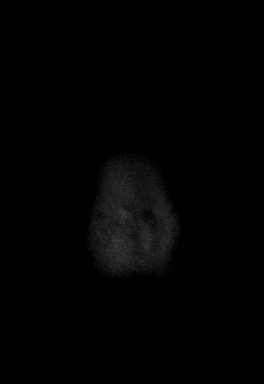

[Series 11: T2 · axial · 5.0mm · 0.57mm/px · z∈[-96,+50]mm · 2 of 24 slices shown (2 of 2)]
[im 1/24]
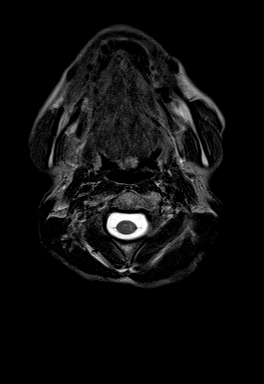
[im 24/24]
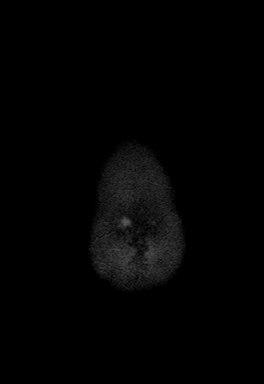

[Series 12: FLAIR · axial · 3.0mm · 0.69mm/px · z∈[-97,+52]mm · 4 of 52 slices shown]
[im 1/52]
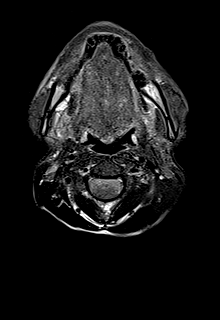
[im 18/52]
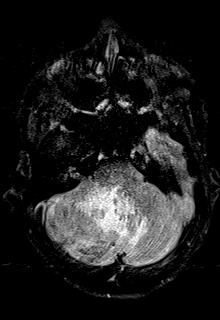
[im 35/52]
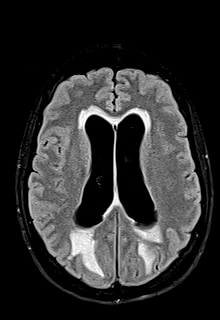
[im 52/52]
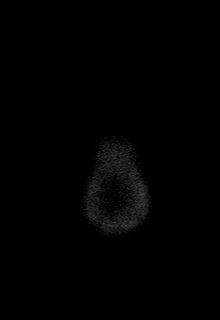

[Series 13: GRE · axial · 3.0mm · 0.43mm/px · 1 of 12 slices shown (1 of 2)]
[im 1/12]
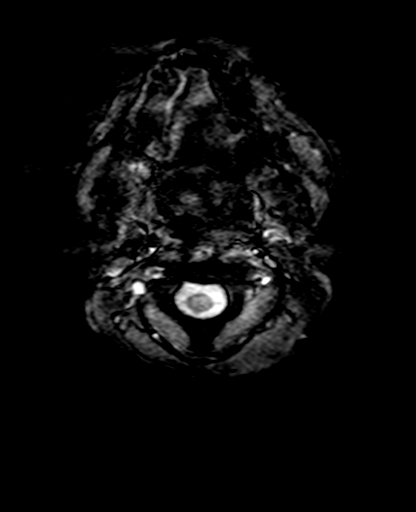

[Series 14: GRE · axial · 3.0mm · 0.43mm/px · z∈[-96,+50]mm · 4 of 51 slices shown (2 of 2)]
[im 1/51]
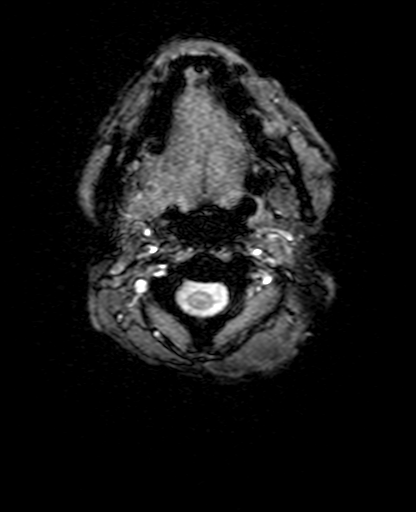
[im 17/51]
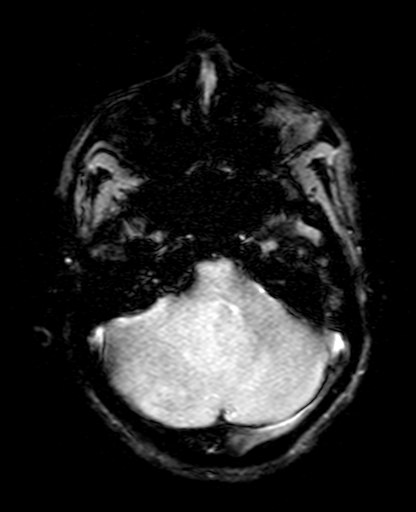
[im 34/51]
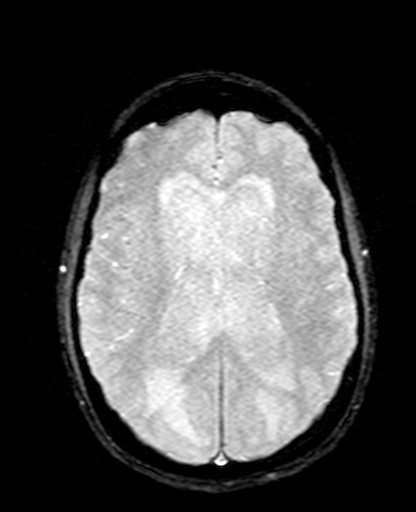
[im 51/51]
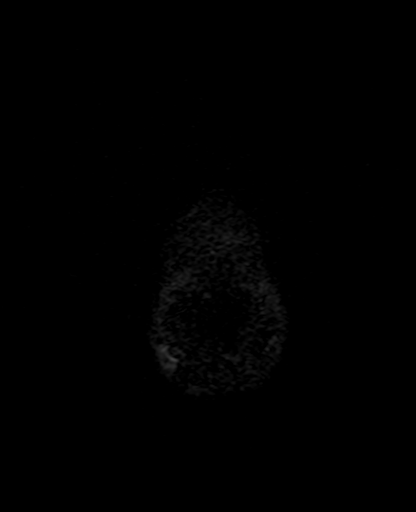

[Series 15: T1 · axial · 5.0mm · 0.43mm/px · z∈[-96,+49]mm · 2 of 24 slices shown (2 of 2)]
[im 1/24]
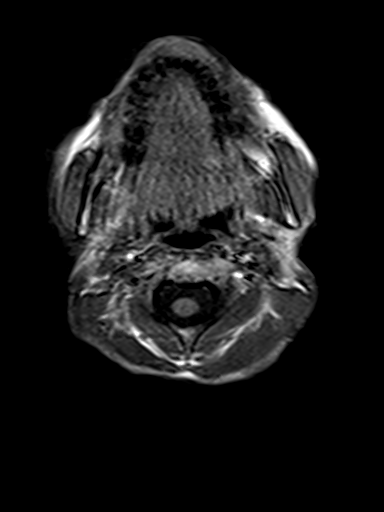
[im 24/24]
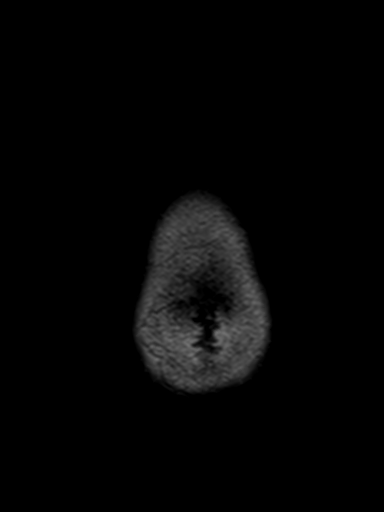

[Series 16: T2 post-contrast · coronal · 5.0mm · 0.86mm/px · 2 of 27 slices shown]
[im 1/27]
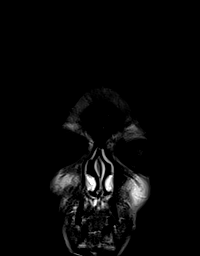
[im 27/27]
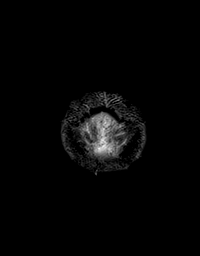

[Series 17: T1 post-contrast · axial · 5.0mm · 0.43mm/px · z∈[-96,+49]mm · 2 of 24 slices shown (1 of 3)]
[im 1/24]
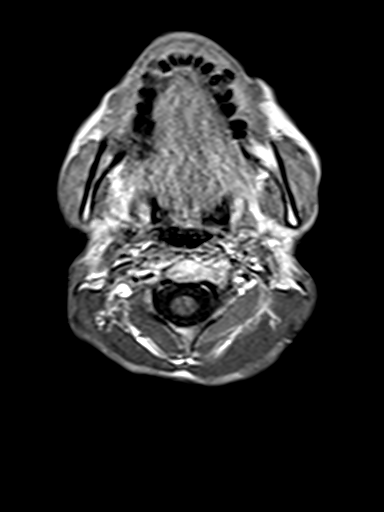
[im 24/24]
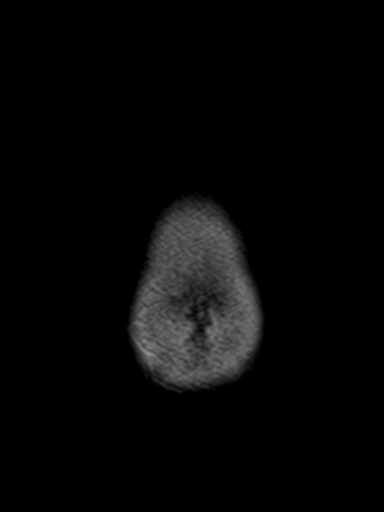

[Series 18: T1 post-contrast · coronal · 5.0mm · 0.43mm/px · 2 of 27 slices shown (2 of 3)]
[im 1/27]
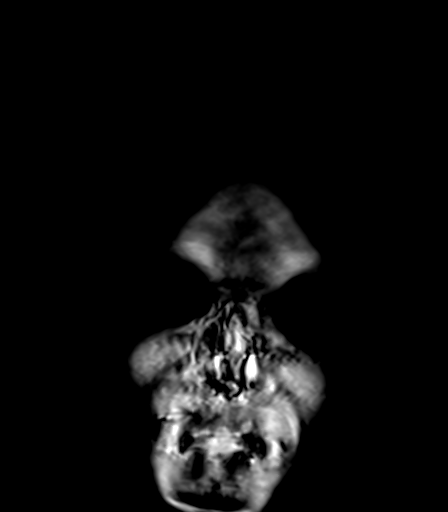
[im 27/27]
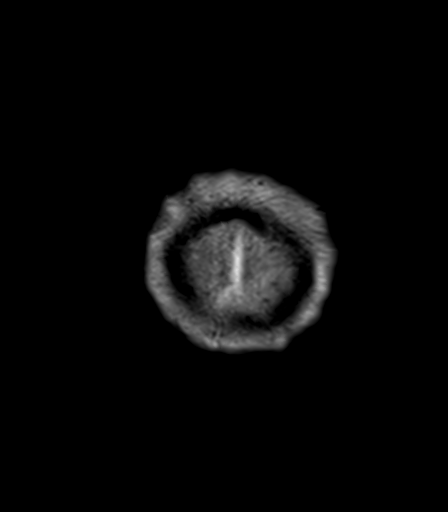

[Series 19: T1 post-contrast · sagittal · 5.0mm · 0.86mm/px · 2 of 22 slices shown (3 of 3)]
[im 1/22]
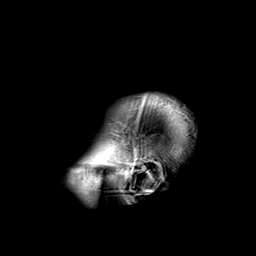
[im 22/22]
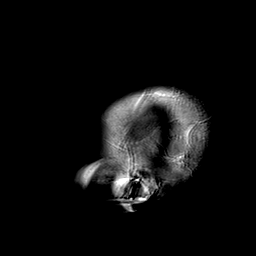

[48 of 48 positions shown; findings below may reference images not displayed]

FINDINGS: Brain: There is a mass centered within the right cerebellum that
measures 5.6 x 3.5 x 3.3 cm this causes vasogenic edema throughout
the right cerebellar hemisphere with mass effect on the fourth
ventricle and cerebral aqua duct. There is resultant moderate
obstructive hydrocephalus of the lateral and third ventricles. There
is periventricular hyperintense T2-weighted signal consistent with
transependymal interstitial edema. There are no other
contrast-enhancing lesions identified.

Vascular: Normal flow voids.

Skull and upper cervical spine: Normal marrow signal.

Sinuses/Orbits: Negative.

Other: None.
IMPRESSION: 1. Mass centered within the right cerebellum measuring 5.6 x 3.5 x
3.3 cm with vasogenic edema throughout the right cerebellar
hemisphere and mass effect on the fourth ventricle and cerebral
aqueduct. Given history of breast carcinoma, this is most likely a
solitary metastasis.
2. Moderate obstructive hydrocephalus of the lateral and third
ventricles with transependymal interstitial edema.
3. Critical Value/emergent results were called by telephone at the
[facility] , who verbally acknowledged these results.

## 2019-09-02 MED ORDER — DEXAMETHASONE SODIUM PHOSPHATE 10 MG/ML IJ SOLN
10.0000 mg | Freq: Once | INTRAMUSCULAR | Status: AC
  Administered 2019-09-03: 10 mg via INTRAVENOUS
  Filled 2019-09-02: qty 1

## 2019-09-02 MED ORDER — GADOBUTROL 1 MMOL/ML IV SOLN
3.5000 mL | Freq: Once | INTRAVENOUS | Status: AC | PRN
  Administered 2019-09-02: 3.5 mL via INTRAVENOUS

## 2019-09-02 NOTE — ED Notes (Signed)
Pt informed that she needs a urine sample; pt says that she is unable to urinate at this time

## 2019-09-02 NOTE — ED Provider Notes (Signed)
[place] DEPT Provider Note   CSN: [id number] Arrival date & time: 09/02/19  2044     History Chief Complaint  Patient presents with  . Lack Of Coordination    Diane Rosario is a 44 y.o. female.  Presents to ER with concern for difficulty walking.  Patient states over the past few weeks she has been having progressive difficulty in walking, states that this has been more apparent over the past couple weeks, has also had some minor falls.  States that she has a hard time coordinating her feet.  Also has been having worsening headaches, worse when she bends down.  History of breast cancer, reportedly in remission.  Denies any numbness, weakness, vision changes, speech changes.  Completed chart review, urgent care note for headache on 4/29.  HPI     Past Medical History:  Diagnosis Date  . Anxiety   . Bipolar 1 disorder ([place])    pt reports bipolar, pt reports Auditory hallucinations  . COPD (chronic obstructive pulmonary disease) ([place])   . Depression   . Malignant neoplasm of upper-outer quadrant of left breast in female, estrogen receptor positive ([place]) 11/03/2016  . PTSD (post-traumatic stress disorder)    husband was murdered    Patient Active Problem List   Diagnosis Date Noted  . Neoplasm of brain causing mass effect on adjacent structures ([place]) 09/03/2019  . Chemotherapy-induced neuropathy ([place]) 11/21/2017  . Port-A-Cath in place 01/31/2017  . Cancer of overlapping sites of left female breast ([place]) 12/14/2016  . Genetic testing 12/07/2016  . Malignant neoplasm of upper-outer quadrant of left breast in female, estrogen receptor positive ([place]) 11/03/2016  . Posttraumatic stress disorder 09/06/2012  . Major depressive disorder, recurrent episode, severe, specified as with psychotic behavior 09/06/2012  . Cannabis abuse 09/06/2012  . Cocaine abuse ([place]) 09/06/2012    Past Surgical History:  Procedure Laterality Date  . EYE SURGERY       both eyes ; correct weak eye muscles   . MASTECTOMY MODIFIED RADICAL Left 12/14/2016   Procedure: LEFT MODIFIED RADICAL MASTECTOMY WITH ERAS PATHWAY;  Surgeon: [name], MD;  Location: [facility];  Service: General;  Laterality: Left;  . PORTACATH PLACEMENT Right 12/14/2016   Procedure: INSERTION PORT-A-CATH WITH ULTRASOUND;  Surgeon: [name], MD;  Location: [facility];  Service: General;  Laterality: Right;     OB History   No obstetric history on file.     Family History  Problem Relation Age of Onset  . Hypertension Maternal Aunt   . Hypertension Maternal Grandmother   . Cirrhosis Father   . Cancer Father        unknown type    Social History   Tobacco Use  . Smoking status: Current Every Day Smoker    Packs/day: 0.25    Years: 10.00    Pack years: 2.50    Types: Cigarettes  . Smokeless tobacco: Never Used  Substance Use Topics  . Alcohol use: Not Currently  . Drug use: Yes    Types: Marijuana, Cocaine    Home Medications Prior to Admission medications   Medication Sig Start Date End Date Taking? Authorizing Provider  ALPRAZolam Duanne Moron) 0.5 MG tablet Take 0.5 mg by mouth 2 (two) times daily as needed for anxiety.    Yes [provider]  ENSURE (ENSURE) Take 237 mLs by mouth daily.   Yes [provider]  risperiDONE microspheres (RISPERDAL CONSTA) 50 MG injection Inject 50 mg into the muscle every 14 (  fourteen) days.   Yes [provider]  gabapentin (NEURONTIN) 300 MG capsule Take 1 capsule (300 mg total) by mouth 3 (three) times daily. Patient not taking: Reported on 09/02/2019 03/06/18   Nicholas Lose, MD  HYDROcodone-acetaminophen (NORCO/VICODIN) 5-325 MG tablet Take 1 tablet by mouth every 6 (six) hours as needed for moderate pain. Patient not taking: Reported on 09/02/2019 03/27/18   Nicholas Lose, MD  lidocaine-prilocaine (EMLA) cream Apply to affected area once Patient not taking: Reported on 09/02/2019 01/09/17   Nicholas Lose, MD   LORazepam (ATIVAN) 0.5 MG tablet Take 1 tablet (0.5 mg total) by mouth at bedtime. Patient not taking: Reported on 09/02/2019 01/09/17   Nicholas Lose, MD  ondansetron (ZOFRAN) 8 MG tablet Take 1 tablet (8 mg total) by mouth 2 (two) times daily as needed for refractory nausea / vomiting. Start on day 3 after chemo. Patient not taking: Reported on 05/18/2017 01/09/17   Nicholas Lose, MD  prochlorperazine (COMPAZINE) 10 MG tablet Take 1 tablet (10 mg total) by mouth every 6 (six) hours as needed (Nausea or vomiting). Patient not taking: Reported on 05/18/2017 01/09/17   Nicholas Lose, MD  tamoxifen (NOLVADEX) 20 MG tablet Take 1 tablet (20 mg total) by mouth 2 (two) times daily. Patient not taking: Reported on 09/02/2019 09/19/17   Nicholas Lose, MD    Allergies    Diona Fanti [aspirin]  Review of Systems   Review of Systems  Constitutional: Negative for chills and fever.  HENT: Negative for ear pain and sore throat.   Eyes: Negative for pain and visual disturbance.  Respiratory: Negative for cough and shortness of breath.   Cardiovascular: Negative for chest pain and palpitations.  Gastrointestinal: Negative for abdominal pain and vomiting.  Genitourinary: Negative for dysuria and hematuria.  Musculoskeletal: Negative for arthralgias and back pain.  Skin: Negative for color change and rash.  Neurological: Positive for dizziness and weakness. Negative for seizures and syncope.  All other systems reviewed and are negative.   Physical Exam Updated Vital Signs BP (!) 119/97   Pulse 87   Temp 98.3 F (36.8 C) (Oral)   Resp 18   Ht 5\' 6"  (1.676 m)   Wt 33.9 kg   SpO2 100%   BMI 12.07 kg/m   Physical Exam Vitals and nursing note reviewed.  Constitutional:      General: She is not in acute distress.    Appearance: She is well-developed.  HENT:     Head: Normocephalic and atraumatic.  Eyes:     Conjunctiva/sclera: Conjunctivae normal.  Cardiovascular:     Rate and Rhythm: Normal rate and  regular rhythm.     Heart sounds: No murmur.  Pulmonary:     Effort: Pulmonary effort is normal. No respiratory distress.     Breath sounds: Normal breath sounds.  Abdominal:     Palpations: Abdomen is soft.     Tenderness: There is no abdominal tenderness.  Musculoskeletal:        General: No deformity or signs of injury.     Cervical back: Neck supple.  Skin:    General: Skin is warm and dry.     Capillary Refill: Capillary refill takes less than 2 seconds.  Neurological:     General: No focal deficit present.     Mental Status: She is alert and oriented to person, place, and time.  Psychiatric:        Mood and Affect: Mood normal.        Behavior: Behavior  normal.     ED Results / Procedures / Treatments   Labs (all labs ordered are listed, but only abnormal results are displayed) Labs Reviewed  CBC WITH DIFFERENTIAL/PLATELET - Abnormal; Notable for the following components:      Result Value   MCV 102.3 (*)    All other components within normal limits  COMPREHENSIVE METABOLIC PANEL - Abnormal; Notable for the following components:   Glucose, Bld 104 (*)    BUN 23 (*)    All other components within normal limits  URINALYSIS, ROUTINE W REFLEX MICROSCOPIC - Abnormal; Notable for the following components:   Color, Urine AMBER (*)    APPearance CLOUDY (*)    Specific Gravity, Urine 1.035 (*)    Hgb urine dipstick SMALL (*)    Protein, ur 100 (*)    Nitrite POSITIVE (*)    Leukocytes,Ua MODERATE (*)    RBC / HPF >50 (*)    WBC, UA >50 (*)    Bacteria, UA MANY (*)    All other components within normal limits  SARS CORONAVIRUS 2 BY RT PCR (HOSPITAL ORDER, [facility])  VITAMIN B12  VITAMIN B1  HIV ANTIBODY (ROUTINE TESTING W REFLEX)  BASIC METABOLIC PANEL  CBC    EKG None  Radiology MR Brain W and Wo Contrast  Result Date: 09/02/2019 CLINICAL DATA:  Dizziness for 3 weeks. History of breast carcinoma. EXAM: MRI HEAD WITHOUT AND WITH  CONTRAST TECHNIQUE: Multiplanar, multiecho pulse sequences of the brain and surrounding structures were obtained without and with intravenous contrast. CONTRAST:  3.47mL GADAVIST GADOBUTROL 1 MMOL/ML IV SOLN COMPARISON:  None. FINDINGS: Brain: There is a mass centered within the right cerebellum that measures 5.6 x 3.5 x 3.3 cm this causes vasogenic edema throughout the right cerebellar hemisphere with mass effect on the fourth ventricle and cerebral aqua duct. There is resultant moderate obstructive hydrocephalus of the lateral and third ventricles. There is periventricular hyperintense T2-weighted signal consistent with transependymal interstitial edema. There are no other contrast-enhancing lesions identified. Vascular: Normal flow voids. Skull and upper cervical spine: Normal marrow signal. Sinuses/Orbits: Negative. Other: None. IMPRESSION: 1. Mass centered within the right cerebellum measuring 5.6 x 3.5 x 3.3 cm with vasogenic edema throughout the right cerebellar hemisphere and mass effect on the fourth ventricle and cerebral aqueduct. 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 2. Moderate obstructive hydrocephalus of the lateral and third ventricles with transependymal interstitial edema. 3. Critical Value/emergent results were called by telephone at the time of interpretation on 09/02/2019 at 11:02 pm to provider [facility] , who verbally acknowledged these results. Electronically Signed   By: Ulyses Jarred M.D.   On: 09/02/2019 23:03    Procedures .Critical Care Performed by: Lucrezia Starch, MD Authorized by: Lucrezia Starch, MD   Critical care provider statement:    Critical care time (minutes):  37   Critical care was time spent personally by me on the following activities:  Discussions with consultants, evaluation of patient's response to treatment, examination of patient, ordering and performing treatments and interventions, ordering and review of  laboratory studies, ordering and review of radiographic studies, pulse oximetry, re-evaluation of patient's condition, obtaining history from patient or surrogate and review of old charts   (including critical care time)  Medications Ordered in ED Medications  dexamethasone (DECADRON) injection 10 mg (has no administration in time range)  gadobutrol (GADAVIST) 1 MMOL/ML injection 3.5 mL (3.5 mLs Intravenous Contrast  Given 09/02/19 2235)  dexamethasone (DECADRON) injection 10 mg (10 mg Intravenous Given 09/03/19 0011)    ED Course  I have reviewed the triage vital signs and the nursing notes.  Pertinent labs & imaging results that were available during my care of the patient were reviewed by me and considered in my medical decision making (see chart for details).  Clinical Course as of Sep 03 46  Mon Sep 02, 2019  2323 D/w radiology, will d/w NSGY   MR Brain W and Wo Contrast [RD]  Tue Sep 03, 2019  0002 D/w NSGY - decadron for now, likely will need surgery, admit to hosp at [facility]   [RD]    Clinical Course User Index [RD] Lucrezia Starch, MD   MDM Rules/Calculators/A&P                      44 year old lady past medical history of breast cancer thought to be in remission presenting to ER with concern for ataxia, headaches.  Given the symptoms, broad differential, stroke, bleed, metastasis.  MRI obtained to further evaluate.  Unfortunately, patient was found to have a large cerebellar mass causing obstructive hydrocephalus.  Suspect this is the etiology for her symptoms.  Reviewed case with neurosurgery on-call, Dr.Poole who recommended Decadron, hospitalist admission to [facility].  Patient was updated on these results and concern for metastatic process.   Dr. Flossie Buffy with [facility] to admit.   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

## 2019-09-02 NOTE — ED Triage Notes (Addendum)
Pt c/o multiple falls and lack of coordination and thinks she is having a stroke. Hx breast cancer. Last known normal 2 weeks ago.

## 2019-09-02 NOTE — ED Notes (Signed)
Pt transported to MRI 

## 2019-09-03 DIAGNOSIS — T451X5A Adverse effect of antineoplastic and immunosuppressive drugs, initial encounter: Secondary | ICD-10-CM | POA: Diagnosis present

## 2019-09-03 DIAGNOSIS — C50911 Malignant neoplasm of unspecified site of right female breast: Secondary | ICD-10-CM

## 2019-09-03 DIAGNOSIS — Z9221 Personal history of antineoplastic chemotherapy: Secondary | ICD-10-CM

## 2019-09-03 DIAGNOSIS — G600 Hereditary motor and sensory neuropathy: Secondary | ICD-10-CM

## 2019-09-03 DIAGNOSIS — Z886 Allergy status to analgesic agent status: Secondary | ICD-10-CM | POA: Diagnosis not present

## 2019-09-03 DIAGNOSIS — R627 Adult failure to thrive: Secondary | ICD-10-CM

## 2019-09-03 DIAGNOSIS — R270 Ataxia, unspecified: Secondary | ICD-10-CM | POA: Diagnosis present

## 2019-09-03 DIAGNOSIS — Z681 Body mass index (BMI) 19 or less, adult: Secondary | ICD-10-CM | POA: Diagnosis not present

## 2019-09-03 DIAGNOSIS — Z853 Personal history of malignant neoplasm of breast: Secondary | ICD-10-CM | POA: Diagnosis not present

## 2019-09-03 DIAGNOSIS — F319 Bipolar disorder, unspecified: Secondary | ICD-10-CM | POA: Diagnosis present

## 2019-09-03 DIAGNOSIS — J449 Chronic obstructive pulmonary disease, unspecified: Secondary | ICD-10-CM | POA: Diagnosis present

## 2019-09-03 DIAGNOSIS — G911 Obstructive hydrocephalus: Secondary | ICD-10-CM

## 2019-09-03 DIAGNOSIS — Z20822 Contact with and (suspected) exposure to covid-19: Secondary | ICD-10-CM | POA: Diagnosis present

## 2019-09-03 DIAGNOSIS — C50412 Malignant neoplasm of upper-outer quadrant of left female breast: Secondary | ICD-10-CM

## 2019-09-03 DIAGNOSIS — G936 Cerebral edema: Secondary | ICD-10-CM | POA: Diagnosis present

## 2019-09-03 DIAGNOSIS — G935 Compression of brain: Secondary | ICD-10-CM | POA: Diagnosis present

## 2019-09-03 DIAGNOSIS — G939 Disorder of brain, unspecified: Secondary | ICD-10-CM

## 2019-09-03 DIAGNOSIS — F17210 Nicotine dependence, cigarettes, uncomplicated: Secondary | ICD-10-CM

## 2019-09-03 DIAGNOSIS — D496 Neoplasm of unspecified behavior of brain: Secondary | ICD-10-CM | POA: Diagnosis present

## 2019-09-03 DIAGNOSIS — N390 Urinary tract infection, site not specified: Secondary | ICD-10-CM | POA: Diagnosis present

## 2019-09-03 DIAGNOSIS — E43 Unspecified severe protein-calorie malnutrition: Secondary | ICD-10-CM | POA: Diagnosis present

## 2019-09-03 DIAGNOSIS — Z170 Estrogen receptor positive status [ER+]: Secondary | ICD-10-CM

## 2019-09-03 DIAGNOSIS — C7931 Secondary malignant neoplasm of brain: Secondary | ICD-10-CM | POA: Diagnosis present

## 2019-09-03 DIAGNOSIS — R296 Repeated falls: Secondary | ICD-10-CM

## 2019-09-03 DIAGNOSIS — R64 Cachexia: Secondary | ICD-10-CM | POA: Diagnosis present

## 2019-09-03 DIAGNOSIS — F4310 Post-traumatic stress disorder, unspecified: Secondary | ICD-10-CM | POA: Diagnosis present

## 2019-09-03 DIAGNOSIS — R440 Auditory hallucinations: Secondary | ICD-10-CM | POA: Diagnosis present

## 2019-09-03 DIAGNOSIS — G620 Drug-induced polyneuropathy: Secondary | ICD-10-CM | POA: Diagnosis present

## 2019-09-03 DIAGNOSIS — Z809 Family history of malignant neoplasm, unspecified: Secondary | ICD-10-CM | POA: Diagnosis not present

## 2019-09-03 DIAGNOSIS — Z79899 Other long term (current) drug therapy: Secondary | ICD-10-CM | POA: Diagnosis not present

## 2019-09-03 DIAGNOSIS — Z9012 Acquired absence of left breast and nipple: Secondary | ICD-10-CM | POA: Diagnosis not present

## 2019-09-03 DIAGNOSIS — Z8249 Family history of ischemic heart disease and other diseases of the circulatory system: Secondary | ICD-10-CM | POA: Diagnosis not present

## 2019-09-03 LAB — GLUCOSE, CAPILLARY: Glucose-Capillary: 94 mg/dL (ref 70–99)

## 2019-09-03 LAB — BASIC METABOLIC PANEL
Anion gap: 9 (ref 5–15)
BUN: 22 mg/dL — ABNORMAL HIGH (ref 6–20)
CO2: 31 mmol/L (ref 22–32)
Calcium: 9.5 mg/dL (ref 8.9–10.3)
Chloride: 101 mmol/L (ref 98–111)
Creatinine, Ser: 0.83 mg/dL (ref 0.44–1.00)
GFR calc Af Amer: >60 mL/min (ref >60)
GFR calc non Af Amer: >60 mL/min (ref >60)
Glucose, Bld: 143 mg/dL — ABNORMAL HIGH (ref 70–99)
Potassium: 4 mmol/L (ref 3.5–5.1)
Sodium: 141 mmol/L (ref 135–145)

## 2019-09-03 LAB — CBC
HCT: 40.3 % (ref 36.0–46.0)
Hemoglobin: 13.4 g/dL (ref 12.0–15.0)
MCH: 33.1 pg (ref 26.0–34.0)
MCHC: 33.3 g/dL (ref 30.0–36.0)
MCV: 99.5 fL (ref 80.0–100.0)
Platelets: 230 10*3/uL (ref 150–400)
RBC: 4.05 MIL/uL (ref 3.87–5.11)
RDW: 11.7 % (ref 11.5–15.5)
WBC: 5.7 10*3/uL (ref 4.0–10.5)
nRBC: 0 % (ref 0.0–0.2)

## 2019-09-03 LAB — HIV ANTIBODY (ROUTINE TESTING W REFLEX): HIV Screen 4th Generation wRfx: NONREACTIVE

## 2019-09-03 LAB — SARS CORONAVIRUS 2 BY RT PCR (HOSPITAL ORDER, PERFORMED IN ~~LOC~~ HOSPITAL LAB): SARS Coronavirus 2: NEGATIVE

## 2019-09-03 MED ORDER — ALPRAZOLAM 0.5 MG PO TABS
0.5000 mg | ORAL_TABLET | Freq: Two times a day (BID) | ORAL | Status: DC | PRN
  Administered 2019-09-03 – 2019-09-07 (×3): 0.5 mg via ORAL
  Filled 2019-09-03 (×3): qty 1

## 2019-09-03 MED ORDER — CHLORHEXIDINE GLUCONATE CLOTH 2 % EX PADS
6.0000 | MEDICATED_PAD | Freq: Every day | CUTANEOUS | Status: DC
  Administered 2019-09-03 – 2019-09-06 (×4): 6 via TOPICAL

## 2019-09-03 MED ORDER — DEXAMETHASONE SODIUM PHOSPHATE 10 MG/ML IJ SOLN: 10.0000 mg | INTRAMUSCULAR | Status: DC

## 2019-09-03 MED ORDER — DEXAMETHASONE SODIUM PHOSPHATE 4 MG/ML IJ SOLN
4.0000 mg | Freq: Four times a day (QID) | INTRAMUSCULAR | Status: DC
  Administered 2019-09-03 – 2019-09-06 (×11): 4 mg via INTRAVENOUS
  Filled 2019-09-03 (×11): qty 1

## 2019-09-03 MED ORDER — PANTOPRAZOLE SODIUM 40 MG IV SOLR
40.0000 mg | Freq: Two times a day (BID) | INTRAVENOUS | Status: DC
  Administered 2019-09-03 – 2019-09-06 (×7): 40 mg via INTRAVENOUS
  Filled 2019-09-03 (×8): qty 40

## 2019-09-03 MED ORDER — SODIUM CHLORIDE 0.9 % IV SOLN
1.0000 g | INTRAVENOUS | Status: DC
  Administered 2019-09-03 – 2019-09-06 (×4): 1 g via INTRAVENOUS
  Filled 2019-09-03: qty 10
  Filled 2019-09-03: qty 1
  Filled 2019-09-03 (×3): qty 10

## 2019-09-03 MED ORDER — INSULIN ASPART 100 UNIT/ML ~~LOC~~ SOLN
0.0000 [IU] | Freq: Three times a day (TID) | SUBCUTANEOUS | Status: DC
  Administered 2019-09-06: 1 [IU] via SUBCUTANEOUS

## 2019-09-03 NOTE — Consult Note (Signed)
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 female.  HPI: 44 year old female status post mastectomy, chemotherapy and radiation therapy for treatment of right-sided breast carcinoma 4 years ago.  Patient felt to be free of active disease.  Patient presents with 3 to 4-week history of increasing gait instability, dizziness and ataxia.  Patient notes some headache.  Denies any focal weakness.  No sensory loss.  MRI scan demonstrates evidence of a large right posterior fossa tumor which appears to be attached to the posterior fossa dura including the inferior tentorial surface and petrosal surface.  Past Medical History:  Diagnosis Date  . Anxiety   . Bipolar 1 disorder ([place])    pt reports bipolar, pt reports Auditory hallucinations  . COPD (chronic obstructive pulmonary disease) ([place])   . Depression   . Malignant neoplasm of upper-outer quadrant of left breast in female, estrogen receptor positive ([place]) 11/03/2016  . PTSD (post-traumatic stress disorder)    husband was murdered    Past Surgical History:  Procedure Laterality Date  . EYE SURGERY     both eyes ; correct weak eye muscles   . MASTECTOMY MODIFIED RADICAL Left 12/14/2016   Procedure: LEFT MODIFIED RADICAL MASTECTOMY WITH ERAS PATHWAY;  Surgeon: [name], MD;  Location: [facility];  Service: General;  Laterality: Left;  . PORTACATH PLACEMENT Right 12/14/2016   Procedure: INSERTION PORT-A-CATH WITH ULTRASOUND;  Surgeon: [name], MD;  Location: [facility];  Service: General;  Laterality: Right;    Family History  Problem Relation Age of Onset  . Hypertension Maternal Aunt   . Hypertension Maternal Grandmother   . Cirrhosis Father   . Cancer Father        unknown type    Social History:  reports that she has been smoking cigarettes. She has a 2.50 pack-year smoking history. She has never used smokeless tobacco. She reports previous alcohol use. She  reports current drug use. Drugs: Marijuana and Cocaine.  Allergies:  Allergies  Allergen Reactions  . Asa [Aspirin] Hives    Medications: I have reviewed the patient's current medications.  Results for orders placed or performed during the hospital encounter of 09/02/19 (from the past 48 hour(s))  CBC with Differential     Status: Abnormal   Collection Time: 09/02/19 10:05 PM  Result Value Ref Range   WBC 7.8 4.0 - 10.5 K/uL   RBC 4.42 3.87 - 5.11 MIL/uL   Hemoglobin 14.7 12.0 - 15.0 g/dL   HCT 45.2 36.0 - 46.0 %   MCV 102.3 (H) 80.0 - 100.0 fL   MCH 33.3 26.0 - 34.0 pg   MCHC 32.5 30.0 - 36.0 g/dL   RDW 11.6 11.5 - 15.5 %   Platelets 239 150 - 400 K/uL   nRBC 0.0 0.0 - 0.2 %   Neutrophils Relative % 62 %   Neutro Abs 4.7 1.7 - 7.7 K/uL   Lymphocytes Relative 30 %   Lymphs Abs 2.4 0.7 - 4.0 K/uL   Monocytes Relative 8 %   Monocytes Absolute 0.6 0.1 - 1.0 K/uL   Eosinophils Relative 0 %   Eosinophils Absolute 0.0 0.0 - 0.5 K/uL   Basophils Relative 0 %   Basophils Absolute 0.0 0.0 - 0.1 K/uL   Immature Granulocytes 0 %   Abs Immature Granulocytes 0.01 0.00 - 0.07 K/uL    Comment: Performed at [facility], [place] [address] [place], [place] [address]  Comprehensive  metabolic panel     Status: Abnormal   Collection Time: 09/02/19 10:05 PM  Result Value Ref Range   Sodium 139 135 - 145 mmol/L   Potassium 4.2 3.5 - 5.1 mmol/L   Chloride 101 98 - 111 mmol/L   CO2 26 22 - 32 mmol/L   Glucose, Bld 104 (H) 70 - 99 mg/dL    Comment: Glucose reference range applies only to samples taken after fasting for at least 8 hours.   BUN 23 (H) 6 - 20 mg/dL   Creatinine, Ser 0.88 0.44 - 1.00 mg/dL   Calcium 9.4 8.9 - 10.3 mg/dL   Total Protein 7.8 6.5 - 8.1 g/dL   Albumin 4.4 3.5 - 5.0 g/dL   AST 34 15 - 41 U/L   ALT 18 0 - 44 U/L   Alkaline Phosphatase 56 38 - 126 U/L   Total Bilirubin 0.4 0.3 - 1.2 mg/dL   GFR calc non Af Amer >60 >60 mL/min   GFR calc Af Amer  >60 >60 mL/min   Anion gap 12 5 - 15    Comment: Performed at [facility], [place] [address], [place], [place] [address]  Urinalysis, Routine w reflex microscopic     Status: Abnormal   Collection Time: 09/02/19 10:05 PM  Result Value Ref Range   Color, Urine AMBER (A) YELLOW    Comment: BIOCHEMICALS MAY BE AFFECTED BY COLOR   APPearance CLOUDY (A) CLEAR   Specific Gravity, Urine 1.035 (H) 1.005 - 1.030   pH 5.0 5.0 - 8.0   Glucose, UA NEGATIVE NEGATIVE mg/dL   Hgb urine dipstick SMALL (A) NEGATIVE   Bilirubin Urine NEGATIVE NEGATIVE   Ketones, ur NEGATIVE NEGATIVE mg/dL   Protein, ur 100 (A) NEGATIVE mg/dL   Nitrite POSITIVE (A) NEGATIVE   Leukocytes,Ua MODERATE (A) NEGATIVE   RBC / HPF >50 (H) 0 - 5 RBC/hpf   WBC, UA >50 (H) 0 - 5 WBC/hpf   Bacteria, UA MANY (A) NONE SEEN   Squamous Epithelial / LPF 21-50 0 - 5   Mucus PRESENT     Comment: Performed at [facility], [place] [address], [place], [place] [address]  Vitamin B12     Status: None   Collection Time: 09/02/19 10:05 PM  Result Value Ref Range   Vitamin B-12 417 180 - 914 pg/mL    Comment: (NOTE) This assay is not validated for testing neonatal or myeloproliferative syndrome specimens for Vitamin B12 levels. Performed at [facility], [place] [address] [place], [place] [address]   SARS Coronavirus 2 by RT PCR (hospital order, performed in [facility] hospital lab) Nasopharyngeal Nasopharyngeal Swab     Status: None   Collection Time: 09/03/19 12:14 AM   Specimen: Nasopharyngeal Swab  Result Value Ref Range   SARS Coronavirus 2 NEGATIVE NEGATIVE    Comment: (NOTE) SARS-CoV-2 target nucleic acids are NOT DETECTED. The SARS-CoV-2 RNA is generally detectable in upper and lower respiratory specimens during the acute phase of infection. The lowest concentration of SARS-CoV-2 viral copies this assay can detect is 250 copies / mL. A negative result does not preclude  SARS-CoV-2 infection and should not be used as the sole basis for treatment or other patient management decisions.  A negative result may occur with improper specimen collection / handling, submission of specimen other than nasopharyngeal swab, presence of viral mutation(s) within the areas targeted by this assay, and inadequate number of viral copies (<250 copies / mL).  A negative result must be combined with clinical observations, patient history, and epidemiological information. Fact Sheet for Patients:   StrictlyIdeas.no Fact Sheet for Healthcare Providers: BankingDealers.co.za This test is not yet approved or cleared  by the [place] FDA and has been authorized for detection and/or diagnosis of SARS-CoV-2 by FDA under an Emergency Use Authorization (EUA).  This EUA will remain in effect (meaning this test can be used) for the duration of the COVID-19 declaration under Section 564(b)(1) of the Act, 21 U.S.C. section 360bbb-3(b)(1), unless the authorization is terminated or revoked sooner. Performed at [facility], [place] [address], [place], [place] [address]   HIV Antibody (routine testing w rflx)     Status: None   Collection Time: 09/03/19  5:27 AM  Result Value Ref Range   HIV Screen 4th Generation wRfx Non Reactive Non Reactive    Comment: Performed at [facility], [place] [address] [place], [place] [id number]  Basic metabolic panel     Status: Abnormal   Collection Time: 09/03/19  5:27 AM  Result Value Ref Range   Sodium 141 135 - 145 mmol/L   Potassium 4.0 3.5 - 5.1 mmol/L   Chloride 101 98 - 111 mmol/L   CO2 31 22 - 32 mmol/L   Glucose, Bld 143 (H) 70 - 99 mg/dL    Comment: Glucose reference range applies only to samples taken after fasting for at least 8 hours.   BUN 22 (H) 6 - 20 mg/dL   Creatinine, Ser 0.83 0.44 - 1.00 mg/dL   Calcium 9.5 8.9 - 10.3 mg/dL   GFR calc non Af Amer >60 >60 mL/min    GFR calc Af Amer >60 >60 mL/min   Anion gap 9 5 - 15    Comment: Performed at [facility], [place] [address] [place], [place] [address]  CBC     Status: None   Collection Time: 09/03/19  5:27 AM  Result Value Ref Range   WBC 5.7 4.0 - 10.5 K/uL   RBC 4.05 3.87 - 5.11 MIL/uL   Hemoglobin 13.4 12.0 - 15.0 g/dL   HCT 40.3 36.0 - 46.0 %   MCV 99.5 80.0 - 100.0 fL   MCH 33.1 26.0 - 34.0 pg   MCHC 33.3 30.0 - 36.0 g/dL   RDW 11.7 11.5 - 15.5 %   Platelets 230 150 - 400 K/uL   nRBC 0.0 0.0 - 0.2 %    Comment: Performed at [facility], [place] [address] [place], [place] [address]    MR Brain W and Wo Contrast  Result Date: 09/02/2019 CLINICAL DATA:  Dizziness for 3 weeks. History of breast carcinoma. EXAM: MRI HEAD WITHOUT AND WITH CONTRAST TECHNIQUE: Multiplanar, multiecho pulse sequences of the brain and surrounding structures were obtained without and with intravenous contrast. CONTRAST:  3.70mL GADAVIST GADOBUTROL 1 MMOL/ML IV SOLN COMPARISON:  None. FINDINGS: Brain: There is a mass centered within the right cerebellum that measures 5.6 x 3.5 x 3.3 cm this causes vasogenic edema throughout the right cerebellar hemisphere with mass effect on the fourth ventricle and cerebral aqua duct. There is resultant moderate obstructive hydrocephalus of the lateral and third ventricles. There is periventricular hyperintense T2-weighted signal consistent with transependymal interstitial edema. There are no other contrast-enhancing lesions identified. Vascular: Normal flow voids. Skull and upper cervical spine: Normal marrow signal. Sinuses/Orbits: Negative. Other: None. IMPRESSION: 1. Mass centered within the right cerebellum measuring 5.6 x 3.5 x 3.3 cm with  vasogenic edema throughout the right cerebellar hemisphere and mass effect on the fourth ventricle and cerebral aqueduct. 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 2. Moderate  obstructive hydrocephalus of the lateral and third ventricles with transependymal interstitial edema. 3. Critical Value/emergent results were called by telephone at the time of interpretation on 09/02/2019 at 11:02 pm to provider [facility] , who verbally acknowledged these results. Electronically Signed   By: Ulyses Jarred M.D.   On: 09/02/2019 23:03    Pertinent items noted in HPI and remainder of comprehensive ROS otherwise negative. Blood pressure 114/90, pulse 84, temperature 98.9 F (37.2 C), temperature source Oral, resp. rate 17, height 5\' 6"  (1.676 m), weight 33.9 kg, SpO2 100 %. Patient is awake and alert.  She is oriented and appropriate.  Speech is fluent.  Judgment insight appear intact.  Cranial nerve function normal bilateral.  Motor examination with intact motor strength bilaterally.  Sensory examination nonfocal.  Patient with some right-sided dysmetria on finger-to-nose testing and rapid alternating movements.  Poor heel-to-shin on right.  Examination head ears eyes nose and throat is unremarked.  Chest and abdomen are benign.  Extremities are free from injury deformity.  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  I discussed situation with the patient.  I have informed her that if this tumor is untreated he will continue to grow and become fatal.  I have discussed the possibility of undergoing surgical debulking of this tumor via right-sided suboccipital craniectomy in hopes of improving her hydrocephalus and prolonging her functionality.  I discussed the risks involved with surgery including but not limited to risk of anesthesia, bleeding, infection, CSF leak, cranial nerve injury, hydrocephalus, need for further surgery and most importantly that this surgery is not in no way expected to be curative with regard to her cancer.  She will  require adjunctive radiation therapy postoperatively.  She needs to have further cancer surveillance with CT scans of her chest and abdomen.  Plan for surgery tomorrow afternoon.  Patient has been given the opportunity ask questions.  She agrees to proceed. Cooper Render [place] 09/03/2019, 6:14 PM

## 2019-09-03 NOTE — ED Notes (Signed)
Pt provided with meal tray a this time.

## 2019-09-03 NOTE — H&P (View-Only) (Signed)
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 female.  HPI: 45 year old female status post mastectomy, chemotherapy and radiation therapy for treatment of right-sided breast carcinoma 4 years ago.  Patient felt to be free of active disease.  Patient presents with 3 to 4-week history of increasing gait instability, dizziness and ataxia.  Patient notes some headache.  Denies any focal weakness.  No sensory loss.  MRI scan demonstrates evidence of a large right posterior fossa tumor which appears to be attached to the posterior fossa dura including the inferior tentorial surface and petrosal surface.  Past Medical History:  Diagnosis Date  . Anxiety   . Bipolar 1 disorder ([place])    pt reports bipolar, pt reports Auditory hallucinations  . COPD (chronic obstructive pulmonary disease) ([place])   . Depression   . Malignant neoplasm of upper-outer quadrant of left breast in female, estrogen receptor positive ([place]) 11/03/2016  . PTSD (post-traumatic stress disorder)    husband was murdered    Past Surgical History:  Procedure Laterality Date  . EYE SURGERY     both eyes ; correct weak eye muscles   . MASTECTOMY MODIFIED RADICAL Left 12/14/2016   Procedure: LEFT MODIFIED RADICAL MASTECTOMY WITH ERAS PATHWAY;  Surgeon: [name], MD;  Location: [facility];  Service: General;  Laterality: Left;  . PORTACATH PLACEMENT Right 12/14/2016   Procedure: INSERTION PORT-A-CATH WITH ULTRASOUND;  Surgeon: [name], MD;  Location: [facility];  Service: General;  Laterality: Right;    Family History  Problem Relation Age of Onset  . Hypertension Maternal Aunt   . Hypertension Maternal Grandmother   . Cirrhosis Father   . Cancer Father        unknown type    Social History:  reports that she has been smoking cigarettes. She has a 2.50 pack-year smoking history. She has never used smokeless tobacco. She reports previous alcohol use. She  reports current drug use. Drugs: Marijuana and Cocaine.  Allergies:  Allergies  Allergen Reactions  . Asa [Aspirin] Hives    Medications: I have reviewed the patient's current medications.  Results for orders placed or performed during the hospital encounter of 09/02/19 (from the past 48 hour(s))  CBC with Differential     Status: Abnormal   Collection Time: 09/02/19 10:05 PM  Result Value Ref Range   WBC 7.8 4.0 - 10.5 K/uL   RBC 4.42 3.87 - 5.11 MIL/uL   Hemoglobin 14.7 12.0 - 15.0 g/dL   HCT 45.2 36.0 - 46.0 %   MCV 102.3 (H) 80.0 - 100.0 fL   MCH 33.3 26.0 - 34.0 pg   MCHC 32.5 30.0 - 36.0 g/dL   RDW 11.6 11.5 - 15.5 %   Platelets 239 150 - 400 K/uL   nRBC 0.0 0.0 - 0.2 %   Neutrophils Relative % 62 %   Neutro Abs 4.7 1.7 - 7.7 K/uL   Lymphocytes Relative 30 %   Lymphs Abs 2.4 0.7 - 4.0 K/uL   Monocytes Relative 8 %   Monocytes Absolute 0.6 0.1 - 1.0 K/uL   Eosinophils Relative 0 %   Eosinophils Absolute 0.0 0.0 - 0.5 K/uL   Basophils Relative 0 %   Basophils Absolute 0.0 0.0 - 0.1 K/uL   Immature Granulocytes 0 %   Abs Immature Granulocytes 0.01 0.00 - 0.07 K/uL    Comment: Performed at [facility], [place] [address], [place], [place] [address]  Comprehensive  metabolic panel     Status: Abnormal   Collection Time: 09/02/19 10:05 PM  Result Value Ref Range   Sodium 139 135 - 145 mmol/L   Potassium 4.2 3.5 - 5.1 mmol/L   Chloride 101 98 - 111 mmol/L   CO2 26 22 - 32 mmol/L   Glucose, Bld 104 (H) 70 - 99 mg/dL    Comment: Glucose reference range applies only to samples taken after fasting for at least 8 hours.   BUN 23 (H) 6 - 20 mg/dL   Creatinine, Ser 0.88 0.44 - 1.00 mg/dL   Calcium 9.4 8.9 - 10.3 mg/dL   Total Protein 7.8 6.5 - 8.1 g/dL   Albumin 4.4 3.5 - 5.0 g/dL   AST 34 15 - 41 U/L   ALT 18 0 - 44 U/L   Alkaline Phosphatase 56 38 - 126 U/L   Total Bilirubin 0.4 0.3 - 1.2 mg/dL   GFR calc non Af Amer >60 >60 mL/min   GFR calc Af Amer  >60 >60 mL/min   Anion gap 12 5 - 15    Comment: Performed at [facility], [place] [address] [place], [place] [address]  Urinalysis, Routine w reflex microscopic     Status: Abnormal   Collection Time: 09/02/19 10:05 PM  Result Value Ref Range   Color, Urine AMBER (A) YELLOW    Comment: BIOCHEMICALS MAY BE AFFECTED BY COLOR   APPearance CLOUDY (A) CLEAR   Specific Gravity, Urine 1.035 (H) 1.005 - 1.030   pH 5.0 5.0 - 8.0   Glucose, UA NEGATIVE NEGATIVE mg/dL   Hgb urine dipstick SMALL (A) NEGATIVE   Bilirubin Urine NEGATIVE NEGATIVE   Ketones, ur NEGATIVE NEGATIVE mg/dL   Protein, ur 100 (A) NEGATIVE mg/dL   Nitrite POSITIVE (A) NEGATIVE   Leukocytes,Ua MODERATE (A) NEGATIVE   RBC / HPF >50 (H) 0 - 5 RBC/hpf   WBC, UA >50 (H) 0 - 5 WBC/hpf   Bacteria, UA MANY (A) NONE SEEN   Squamous Epithelial / LPF 21-50 0 - 5   Mucus PRESENT     Comment: Performed at [facility], [place] [address], [place], [place] [address]  Vitamin B12     Status: None   Collection Time: 09/02/19 10:05 PM  Result Value Ref Range   Vitamin B-12 417 180 - 914 pg/mL    Comment: (NOTE) This assay is not validated for testing neonatal or myeloproliferative syndrome specimens for Vitamin B12 levels. Performed at [facility], [place] [address], [place], [place] [address]   SARS Coronavirus 2 by RT PCR (hospital order, performed in [facility] hospital lab) Nasopharyngeal Nasopharyngeal Swab     Status: None   Collection Time: 09/03/19 12:14 AM   Specimen: Nasopharyngeal Swab  Result Value Ref Range   SARS Coronavirus 2 NEGATIVE NEGATIVE    Comment: (NOTE) SARS-CoV-2 target nucleic acids are NOT DETECTED. The SARS-CoV-2 RNA is generally detectable in upper and lower respiratory specimens during the acute phase of infection. The lowest concentration of SARS-CoV-2 viral copies this assay can detect is 250 copies / mL. A negative result does not preclude  SARS-CoV-2 infection and should not be used as the sole basis for treatment or other patient management decisions.  A negative result may occur with improper specimen collection / handling, submission of specimen other than nasopharyngeal swab, presence of viral mutation(s) within the areas targeted by this assay, and inadequate number of viral copies (<250 copies / mL).  A negative result must be combined with clinical observations, patient history, and epidemiological information. Fact Sheet for Patients:   StrictlyIdeas.no Fact Sheet for Healthcare Providers: BankingDealers.co.za This test is not yet approved or cleared  by the [place] FDA and has been authorized for detection and/or diagnosis of SARS-CoV-2 by FDA under an Emergency Use Authorization (EUA).  This EUA will remain in effect (meaning this test can be used) for the duration of the COVID-19 declaration under Section 564(b)(1) of the Act, 21 U.S.C. section 360bbb-3(b)(1), unless the authorization is terminated or revoked sooner. Performed at [facility], [place] [address], [place], [place] [address]   HIV Antibody (routine testing w rflx)     Status: None   Collection Time: 09/03/19  5:27 AM  Result Value Ref Range   HIV Screen 4th Generation wRfx Non Reactive Non Reactive    Comment: Performed at [facility], [place] [address], [place], [place] [id number]  Basic metabolic panel     Status: Abnormal   Collection Time: 09/03/19  5:27 AM  Result Value Ref Range   Sodium 141 135 - 145 mmol/L   Potassium 4.0 3.5 - 5.1 mmol/L   Chloride 101 98 - 111 mmol/L   CO2 31 22 - 32 mmol/L   Glucose, Bld 143 (H) 70 - 99 mg/dL    Comment: Glucose reference range applies only to samples taken after fasting for at least 8 hours.   BUN 22 (H) 6 - 20 mg/dL   Creatinine, Ser 0.83 0.44 - 1.00 mg/dL   Calcium 9.5 8.9 - 10.3 mg/dL   GFR calc non Af Amer >60 >60 mL/min    GFR calc Af Amer >60 >60 mL/min   Anion gap 9 5 - 15    Comment: Performed at [facility], [place] [address], [place], [place] [address]  CBC     Status: None   Collection Time: 09/03/19  5:27 AM  Result Value Ref Range   WBC 5.7 4.0 - 10.5 K/uL   RBC 4.05 3.87 - 5.11 MIL/uL   Hemoglobin 13.4 12.0 - 15.0 g/dL   HCT 40.3 36.0 - 46.0 %   MCV 99.5 80.0 - 100.0 fL   MCH 33.1 26.0 - 34.0 pg   MCHC 33.3 30.0 - 36.0 g/dL   RDW 11.7 11.5 - 15.5 %   Platelets 230 150 - 400 K/uL   nRBC 0.0 0.0 - 0.2 %    Comment: Performed at [facility], [place] [address] [place],  [address]    MR Brain W and Wo Contrast  Result Date: 09/02/2019 CLINICAL DATA:  Dizziness for 3 weeks. History of breast carcinoma. EXAM: MRI HEAD WITHOUT AND WITH CONTRAST TECHNIQUE: Multiplanar, multiecho pulse sequences of the brain and surrounding structures were obtained without and with intravenous contrast. CONTRAST:  3.18mL GADAVIST GADOBUTROL 1 MMOL/ML IV SOLN COMPARISON:  None. FINDINGS: Brain: There is a mass centered within the right cerebellum that measures 5.6 x 3.5 x 3.3 cm this causes vasogenic edema throughout the right cerebellar hemisphere with mass effect on the fourth ventricle and cerebral aqua duct. There is resultant moderate obstructive hydrocephalus of the lateral and third ventricles. There is periventricular hyperintense T2-weighted signal consistent with transependymal interstitial edema. There are no other contrast-enhancing lesions identified. Vascular: Normal flow voids. Skull and upper cervical spine: Normal marrow signal. Sinuses/Orbits: Negative. Other: None. IMPRESSION: 1. Mass centered within the right cerebellum measuring 5.6 x 3.5 x 3.3 cm with  vasogenic edema throughout the right cerebellar hemisphere and mass effect on the fourth ventricle and cerebral aqueduct. 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 2. Moderate  obstructive hydrocephalus of the lateral and third ventricles with transependymal interstitial edema. 3. Critical Value/emergent results were called by telephone at the time of interpretation on 09/02/2019 at 11:02 pm to provider [facility] , who verbally acknowledged these results. Electronically Signed   By: Ulyses Jarred M.D.   On: 09/02/2019 23:03    Pertinent items noted in HPI and remainder of comprehensive ROS otherwise negative. Blood pressure 114/90, pulse 84, temperature 98.9 F (37.2 C), temperature source Oral, resp. rate 17, height 5\' 6"  (1.676 m), weight 33.9 kg, SpO2 100 %. Patient is awake and alert.  She is oriented and appropriate.  Speech is fluent.  Judgment insight appear intact.  Cranial nerve function normal bilateral.  Motor examination with intact motor strength bilaterally.  Sensory examination nonfocal.  Patient with some right-sided dysmetria on finger-to-nose testing and rapid alternating movements.  Poor heel-to-shin on right.  Examination head ears eyes nose and throat is unremarked.  Chest and abdomen are benign.  Extremities are free from injury deformity.  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  I discussed situation with the patient.  I have informed her that if this tumor is untreated he will continue to grow and become fatal.  I have discussed the possibility of undergoing surgical debulking of this tumor via right-sided suboccipital craniectomy in hopes of improving her hydrocephalus and prolonging her functionality.  I discussed the risks involved with surgery including but not limited to risk of anesthesia, bleeding, infection, CSF leak, cranial nerve injury, hydrocephalus, need for further surgery and most importantly that this surgery is not in no way expected to be curative with regard to her cancer.  She will  require adjunctive radiation therapy postoperatively.  She needs to have further cancer surveillance with CT scans of her chest and abdomen.  Plan for surgery tomorrow afternoon.  Patient has been given the opportunity ask questions.  She agrees to proceed. Cooper Render Tacora Athanas 09/03/2019, 6:14 PM

## 2019-09-03 NOTE — H&P (Signed)
History and Physical    Diane Rosario [id number] DOB: 10-01-1975 DOA: 09/02/2019  PCP: Department, [facility]  Patient coming from: Home, lives with a friend  I have personally briefly reviewed patient's old medical records in [place]  Chief Complaint: ataxia and headache  HPI: Diane Rosario is a 44 y.o. female with medical history significant for Breast cancer s/p left mastectomy, chemotherapy and radiation, chemo-induced neuropathy, history of cocaine abuse, PTSD who presents with concerns of unsteady gait and headache.  Patient has noted that for the past 3 to 4 weeks she has been having almost constant throbbing and sharp bilateral temporal headache.  Then shortly after noted worsening gait and has fallen several times over the past several weeks.  She normally has some chronic weakness of her lower extremities but ambulates without assistance.  Also notes new urinary incontinent but no increased frequency, urgency or dysuria.  Denies any vision changes.  In the ED, she was afebrile, normotensive on room air. MRI brain notable for mass within right cerebellum causing vasogenic edema and mass effect on 4th ventricle and cerebral aqueduct causing moderate obstructive hydrocephalus.  CBC and CMP unremarkable. UA shows moderate leukocyte and positive nitrate with many bacteria.   ED physician discussed findings with neurosurgery and they recommended that she be transferred to [facility] and started on Decadron.  They will consult on her.   Review of Systems: Constitutional: No Weight Change, No Fever ENT/Mouth: No sore throat, No Rhinorrhea Eyes: No Eye Pain, No Vision Changes Cardiovascular: No Chest Pain, no SOB Respiratory: No Cough, No Sputum, No Wheezing, no Dyspnea  Gastrointestinal: No Nausea, No Vomiting, No Diarrhea, No Constipation, No Pain Genitourinary: + Urinary Incontinence, No Urgency, No Flank Pain Musculoskeletal: No Arthralgias, No  Myalgias Skin: No Skin Lesions, No Pruritus, Neuro: + Weakness, No Numbness, no syncope or loss of consciousness Psych: No Anxiety/Panic, No Depression, no decrease appetite Heme/Lymph: No Bruising, No Bleeding Past Medical History:  Diagnosis Date  . Anxiety   . Bipolar 1 disorder ([place])    pt reports bipolar, pt reports Auditory hallucinations  . COPD (chronic obstructive pulmonary disease) ([place])   . Depression   . Malignant neoplasm of upper-outer quadrant of left breast in female, estrogen receptor positive ([place]) 11/03/2016  . PTSD (post-traumatic stress disorder)    husband was murdered    Past Surgical History:  Procedure Laterality Date  . EYE SURGERY     both eyes ; correct weak eye muscles   . MASTECTOMY MODIFIED RADICAL Left 12/14/2016   Procedure: LEFT MODIFIED RADICAL MASTECTOMY WITH ERAS PATHWAY;  Surgeon: [name], MD;  Location: [facility];  Service: General;  Laterality: Left;  . PORTACATH PLACEMENT Right 12/14/2016   Procedure: INSERTION PORT-A-CATH WITH ULTRASOUND;  Surgeon: [name], MD;  Location: [facility];  Service: General;  Laterality: Right;     reports that she has been smoking cigarettes. She has a 2.50 pack-year smoking history. She has never used smokeless tobacco. She reports previous alcohol use. She reports current drug use. Drugs: Marijuana and Cocaine.  Allergies  Allergen Reactions  . Asa [Aspirin] Hives    Family History  Problem Relation Age of Onset  . Hypertension Maternal Aunt   . Hypertension Maternal Grandmother   . Cirrhosis Father   . Cancer Father        unknown type     Prior to Admission medications   Medication Sig Start Date End Date Taking? Authorizing Provider  ALPRAZolam (  XANAX) 0.5 MG tablet Take 0.5 mg by mouth 2 (two) times daily as needed for anxiety.    Yes [provider]  ENSURE (ENSURE) Take 237 mLs by mouth daily.   Yes [provider]  risperiDONE microspheres (RISPERDAL CONSTA) 50 MG  injection Inject 50 mg into the muscle every 14 (fourteen) days.   Yes [provider]  gabapentin (NEURONTIN) 300 MG capsule Take 1 capsule (300 mg total) by mouth 3 (three) times daily. Patient not taking: Reported on 09/02/2019 03/06/18   Nicholas Lose, MD  HYDROcodone-acetaminophen (NORCO/VICODIN) 5-325 MG tablet Take 1 tablet by mouth every 6 (six) hours as needed for moderate pain. Patient not taking: Reported on 09/02/2019 03/27/18   Nicholas Lose, MD  lidocaine-prilocaine (EMLA) cream Apply to affected area once Patient not taking: Reported on 09/02/2019 01/09/17   Nicholas Lose, MD  LORazepam (ATIVAN) 0.5 MG tablet Take 1 tablet (0.5 mg total) by mouth at bedtime. Patient not taking: Reported on 09/02/2019 01/09/17   Nicholas Lose, MD  ondansetron (ZOFRAN) 8 MG tablet Take 1 tablet (8 mg total) by mouth 2 (two) times daily as needed for refractory nausea / vomiting. Start on day 3 after chemo. Patient not taking: Reported on 05/18/2017 01/09/17   Nicholas Lose, MD  prochlorperazine (COMPAZINE) 10 MG tablet Take 1 tablet (10 mg total) by mouth every 6 (six) hours as needed (Nausea or vomiting). Patient not taking: Reported on 05/18/2017 01/09/17   Nicholas Lose, MD  tamoxifen (NOLVADEX) 20 MG tablet Take 1 tablet (20 mg total) by mouth 2 (two) times daily. Patient not taking: Reported on 09/02/2019 09/19/17   Nicholas Lose, MD    Physical Exam: Vitals:   09/02/19 2130 09/02/19 2303 09/02/19 2345 09/03/19 0000  BP: 112/77 112/84 (!) 119/92 (!) 119/97  Pulse: 96 88 84 87  Resp: 18 15 16 18   Temp:      TempSrc:      SpO2: (!) 87% 99% 100% 100%  Weight:      Height:        Constitutional: NAD, calm, thin, cachectic chronically ill-appearing female appearing older than stated age laying flat in bed Vitals:   09/02/19 2130 09/02/19 2303 09/02/19 2345 09/03/19 0000  BP: 112/77 112/84 (!) 119/92 (!) 119/97  Pulse: 96 88 84 87  Resp: 18 15 16 18   Temp:      TempSrc:      SpO2: (!) 87%  99% 100% 100%  Weight:      Height:       Eyes: PERRL, lids and conjunctivae normal ENMT: Mucous membranes are moist. Posterior pharynx clear of any exudate or lesions.poor and missing front dentition.  Neck: normal, supple Respiratory: clear to auscultation bilaterally, no wheezing, no crackles. Normal respiratory effort. No accessory muscle use.  Cardiovascular: Regular rate and rhythm, no murmurs / rubs / gallops. No extremity edema. 2+ pedal pulses.  Abdomen: no tenderness, no masses palpated. Bowel sounds positive.  Musculoskeletal: no clubbing / cyanosis. No joint deformity upper and lower extremities.  Wasting muscle tone. Skin: no rashes, lesions, ulcers. No induration Neurologic: CN 2-12 grossly intact. Sensation intact, Strength 4/5 in all 4 extremities.  Psychiatric: Normal judgment and insight. Alert and oriented x 3.  Depressed and tearful mood.     Labs on Admission: I have personally reviewed following labs and imaging studies  CBC: Recent Labs  Lab 09/02/19 2205  WBC 7.8  NEUTROABS 4.7  HGB 14.7  HCT 45.2  MCV 102.3*  PLT  [id number]   Basic Metabolic Panel: Recent Labs  Lab 09/02/19 2205  NA 139  K 4.2  CL 101  CO2 26  GLUCOSE 104*  BUN 23*  CREATININE 0.88  CALCIUM 9.4   GFR: Estimated Creatinine Clearance: 43.7 mL/min (by C-G formula based on SCr of 0.88 mg/dL). Liver Function Tests: Recent Labs  Lab 09/02/19 2205  AST 34  ALT 18  ALKPHOS 56  BILITOT 0.4  PROT 7.8  ALBUMIN 4.4   No results for input(s): LIPASE, AMYLASE in the last 168 hours. No results for input(s): AMMONIA in the last 168 hours. Coagulation Profile: No results for input(s): INR, PROTIME in the last 168 hours. Cardiac Enzymes: No results for input(s): CKTOTAL, CKMB, CKMBINDEX, TROPONINI in the last 168 hours. BNP (last 3 results) No results for input(s): PROBNP in the last 8760 hours. HbA1C: No results for input(s): HGBA1C in the last 72 hours. CBG: No results for  input(s): GLUCAP in the last 168 hours. Lipid Profile: No results for input(s): CHOL, HDL, LDLCALC, TRIG, CHOLHDL, LDLDIRECT in the last 72 hours. Thyroid Function Tests: No results for input(s): TSH, T4TOTAL, FREET4, T3FREE, THYROIDAB in the last 72 hours. Anemia Panel: Recent Labs    09/02/19 2205  VITAMINB12 417   Urine analysis:    Component Value Date/Time   COLORURINE AMBER (A) 09/02/2019 2205   APPEARANCEUR CLOUDY (A) 09/02/2019 2205   LABSPEC 1.035 (H) 09/02/2019 2205   PHURINE 5.0 09/02/2019 2205   GLUCOSEU NEGATIVE 09/02/2019 2205   HGBUR SMALL (A) 09/02/2019 2205   BILIRUBINUR NEGATIVE 09/02/2019 2205   KETONESUR NEGATIVE 09/02/2019 2205   PROTEINUR 100 (A) 09/02/2019 2205   UROBILINOGEN 0.2 09/05/2012 1632   NITRITE POSITIVE (A) 09/02/2019 2205   LEUKOCYTESUR MODERATE (A) 09/02/2019 2205    Radiological Exams on Admission: MR Brain W and Wo Contrast  Result Date: 09/02/2019 CLINICAL DATA:  Dizziness for 3 weeks. History of breast carcinoma. EXAM: MRI HEAD WITHOUT AND WITH CONTRAST TECHNIQUE: Multiplanar, multiecho pulse sequences of the brain and surrounding structures were obtained without and with intravenous contrast. CONTRAST:  3.5mL GADAVIST GADOBUTROL 1 MMOL/ML IV SOLN COMPARISON:  None. FINDINGS: Brain: There is a mass centered within the right cerebellum that measures 5.6 x 3.5 x 3.3 cm this causes vasogenic edema throughout the right cerebellar hemisphere with mass effect on the fourth ventricle and cerebral aqua duct. There is resultant moderate obstructive hydrocephalus of the lateral and third ventricles. There is periventricular hyperintense T2-weighted signal consistent with transependymal interstitial edema. There are no other contrast-enhancing lesions identified. Vascular: Normal flow voids. Skull and upper cervical spine: Normal marrow signal. Sinuses/Orbits: Negative. Other: None. IMPRESSION: 1. Mass centered within the right cerebellum measuring 5.6 x  3.5 x 3.3 cm with vasogenic edema throughout the right cerebellar hemisphere and mass effect on the fourth ventricle and cerebral aqueduct. 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 2. Moderate obstructive hydrocephalus of the lateral and third ventricles with transependymal interstitial edema. 3. Critical Value/emergent results were called by telephone at the time of interpretation on 09/02/2019 at 11:02 pm to provider [facility] , who verbally acknowledged these results. Electronically Signed   By: Ulyses Jarred M.D.   On: 09/02/2019 23:03      Assessment/Plan  New right cerebellum mass with vasogenic edema/mass effect/obstructive hydrocephalus  Neurosurgery to consult with transfer to [facility]  Daily IV Decadron  Frequent neurovascular checks  Will need oncology consult in the morning  UTI Incontinence could  be due to hydrocephalus but will start Rocephin for now pending urine culture  Failure to thrive  Likely due to malignancy Consult dietitian  Hx of breast cancer s/p left mastectomy  Suspect brain mass could be primary metastasis  DVT prophylaxis:SCDs Code Status: Full Family Communication: Plan discussed with patient at bedside  disposition Plan: Home with at least 2 midnight stays  Consults called: Neurosurgery Admission status: inpatient with transfer to [facility]  Status is: Inpatient  Remains inpatient appropriate because:Inpatient level of care appropriate due to severity of illness   Dispo: The patient is from: Home              Anticipated d/c is to: Home              Anticipated d/c date is: 3 days              Patient currently is not medically stable to d/c.          Orene Desanctis DO [facility]   If 7PM-7AM, please contact night-coverage [email or web address]   09/03/2019, 12:47 AM

## 2019-09-03 NOTE — Consult Note (Signed)
[facility] Consult Note  Patient Care Team: Department, [facility] as PCP - General [name], MD as Consulting Physician (General Surgery) Nicholas Lose, MD as Consulting Physician (Hematology and Oncology) Kyung Rudd, MD as Consulting Physician (Radiation Oncology) Delice Bison, Charlestine Massed, NP as Nurse Practitioner (Hematology and Oncology)  CHIEF COMPLAINTS/PURPOSE OF CONSULTATION:  Dystaxia, Brain Tumor  HISTORY OF PRESENTING ILLNESS:  Diane Rosario 44 y.o. female presented with several weeks of new onset severe headaches, and progressive gait impairment.  At home she was walking independently but had several falls due to imbalance.  She does not have history of headache syndrome.  Unclear if she has been compliant with Tamoxifen, completed chemo and RT for breast cancer in 2019.     MEDICAL HISTORY:  Past Medical History:  Diagnosis Date  . Anxiety   . Bipolar 1 disorder ([place])    pt reports bipolar, pt reports Auditory hallucinations  . COPD (chronic obstructive pulmonary disease) ([place])   . Depression   . Malignant neoplasm of upper-outer quadrant of left breast in female, estrogen receptor positive ([place]) 11/03/2016  . PTSD (post-traumatic stress disorder)    husband was murdered    SURGICAL HISTORY: Past Surgical History:  Procedure Laterality Date  . EYE SURGERY     both eyes ; correct weak eye muscles   . MASTECTOMY MODIFIED RADICAL Left 12/14/2016   Procedure: LEFT MODIFIED RADICAL MASTECTOMY WITH ERAS PATHWAY;  Surgeon: [name], MD;  Location: [facility];  Service: General;  Laterality: Left;  . PORTACATH PLACEMENT Right 12/14/2016   Procedure: INSERTION PORT-A-CATH WITH ULTRASOUND;  Surgeon: [name], MD;  Location: [facility];  Service: General;  Laterality: Right;    SOCIAL HISTORY: Social History   Socioeconomic History  . Marital status: Widowed    Spouse name: Not on file  . Number of children: Not on  file  . Years of education: Not on file  . Highest education level: Not on file  Occupational History  . Not on file  Tobacco Use  . Smoking status: Current Every Day Smoker    Packs/day: 0.25    Years: 10.00    Pack years: 2.50    Types: Cigarettes  . Smokeless tobacco: Never Used  Substance and Sexual Activity  . Alcohol use: Not Currently  . Drug use: Yes    Types: Marijuana, Cocaine  . Sexual activity: Yes  Other Topics Concern  . Not on file  Social History Narrative  . Not on file   Social Determinants of Health   Financial Resource Strain:   . Difficulty of Paying Living Expenses:   Food Insecurity:   . Worried About Charity fundraiser in the Last Year:   . Arboriculturist in the Last Year:   Transportation Needs:   . Film/video editor (Medical):   Marland Kitchen Lack of Transportation (Non-Medical):   Physical Activity:   . Days of Exercise per Week:   . Minutes of Exercise per Session:   Stress:   . Feeling of Stress :   Social Connections:   . Frequency of Communication with Friends and Family:   . Frequency of Social Gatherings with Friends and Family:   . Attends Religious Services:   . Active Member of Clubs or Organizations:   . Attends Archivist Meetings:   Marland Kitchen Marital Status:   Intimate Partner Violence:   . Fear of Current or Ex-Partner:   . Emotionally Abused:   .  Physically Abused:   . Sexually Abused:     FAMILY HISTORY: Family History  Problem Relation Age of Onset  . Hypertension Maternal Aunt   . Hypertension Maternal Grandmother   . Cirrhosis Father   . Cancer Father        unknown type    ALLERGIES:  is allergic to asa [aspirin].  MEDICATIONS:  Current Facility-Administered Medications  Medication Dose Route Frequency Provider Last Rate Last Admin  . ALPRAZolam Duanne Moron) tablet 0.5 mg  0.5 mg Oral BID PRN Tu, Ching T, DO      . cefTRIAXone (ROCEPHIN) 1 g in sodium chloride 0.9 % 100 mL IVPB  1 g Intravenous Q24H Tu, Ching T,  DO   Stopped at 09/03/19 0232  . [START ON 09/04/2019] dexamethasone (DECADRON) injection 10 mg  10 mg Intravenous Q24H Tu, Ching T, DO       Current Outpatient Medications  Medication Sig Dispense Refill  . ALPRAZolam (XANAX) 0.5 MG tablet Take 0.5 mg by mouth 2 (two) times daily as needed for anxiety.     . ENSURE (ENSURE) Take 237 mLs by mouth daily.    . risperiDONE microspheres (RISPERDAL CONSTA) 50 MG injection Inject 50 mg into the muscle every 14 (fourteen) days.    Marland Kitchen gabapentin (NEURONTIN) 300 MG capsule Take 1 capsule (300 mg total) by mouth 3 (three) times daily. (Patient not taking: Reported on 09/02/2019) 90 capsule 0  . HYDROcodone-acetaminophen (NORCO/VICODIN) 5-325 MG tablet Take 1 tablet by mouth every 6 (six) hours as needed for moderate pain. (Patient not taking: Reported on 09/02/2019) 30 tablet 0  . lidocaine-prilocaine (EMLA) cream Apply to affected area once (Patient not taking: Reported on 09/02/2019) 30 g 3  . LORazepam (ATIVAN) 0.5 MG tablet Take 1 tablet (0.5 mg total) by mouth at bedtime. (Patient not taking: Reported on 09/02/2019) 30 tablet 0  . ondansetron (ZOFRAN) 8 MG tablet Take 1 tablet (8 mg total) by mouth 2 (two) times daily as needed for refractory nausea / vomiting. Start on day 3 after chemo. (Patient not taking: Reported on 05/18/2017) 30 tablet 1  . prochlorperazine (COMPAZINE) 10 MG tablet Take 1 tablet (10 mg total) by mouth every 6 (six) hours as needed (Nausea or vomiting). (Patient not taking: Reported on 05/18/2017) 30 tablet 1  . tamoxifen (NOLVADEX) 20 MG tablet Take 1 tablet (20 mg total) by mouth 2 (two) times daily. (Patient not taking: Reported on 09/02/2019) 90 tablet 3   Facility-Administered Medications Ordered in Other Encounters  Medication Dose Route Frequency Provider Last Rate Last Admin  . sodium chloride flush (NS) 0.9 % injection 10 mL  10 mL Intracatheter PRN Nicholas Lose, MD        REVIEW OF SYSTEMS:   Constitutional: denies  fever Eyes: Denies blurriness of vision Ears, nose, mouth, throat, and face: Denies mucositis or sore throat Respiratory: Denies cough, dyspnea or wheezes Cardiovascular: Denies palpitation, chest discomfort or lower extremity swelling Gastrointestinal:  Denies nausea, constipation, diarrhea GU: Denies dysuria or incontinence Skin: Denies abnormal skin rashes Neurological: Per HPI Musculoskeletal: Denies joint pain, back or neck discomfort. No decrease in ROM Behavioral/Psych: Denies anxiety, disturbance in thought content, and mood instability   PHYSICAL EXAMINATION: Vitals:   09/03/19 1545 09/03/19 1600  BP: 112/88 114/90  Pulse: 79 84  Resp: 16 17  Temp:    SpO2: 100% 100%   KPS: 60. General: Cachectic appearing Head: normal EENT: No conjunctival injection or scleral icterus. Oral mucosa moist Lungs: Resp effort  normal Cardiac: Regular rate and rhythm Abdomen: Soft, non-distended abdomen Skin: No rashes cyanosis or petechiae. Extremities: No clubbing or edema  NEUROLOGIC EXAM: Mental Status: Awake, alert, attentive to examiner. Oriented to self and environment. Language is fluent with intact comprehension.  Age advanced psychomotor slowing present.  Poor insight into clinical situation.  Cranial Nerves: Visual acuity is grossly normal. Visual fields are full. Extra-ocular movements intact. No ptosis. Face is symmetric, tongue midline. Motor: Tone and bulk are normal. Power is full in both arms and legs. Reflexes are symmetric, no pathologic reflexes present. Moderate dysmetria on right.  Sensory: Intact to light touch and temperature Gait: Deferred   LABORATORY DATA:  I have reviewed the data as listed Lab Results  Component Value Date   WBC 5.7 09/03/2019   HGB 13.4 09/03/2019   HCT 40.3 09/03/2019   MCV 99.5 09/03/2019   PLT 230 09/03/2019   Recent Labs    09/02/19 2205 09/03/19 0527  NA 139 141  K 4.2 4.0  CL 101 101  CO2 26 31  GLUCOSE 104* 143*  BUN  23* 22*  CREATININE 0.88 0.83  CALCIUM 9.4 9.5  GFRNONAA >60 >60  GFRAA >60 >60  PROT 7.8  --   ALBUMIN 4.4  --   AST 34  --   ALT 18  --   ALKPHOS 56  --   BILITOT 0.4  --     RADIOGRAPHIC STUDIES: I have personally reviewed the radiological images as listed and agreed with the findings in the report. DG Chest 2 View  Result Date: 08/19/2019 CLINICAL DATA:  Tachycardia. EXAM: CHEST - 2 VIEW COMPARISON:  12/14/2016 FINDINGS: The previously demonstrated right-sided Port-A-Cath has been removed. The heart size is stable. There is a small pulmonary opacity projecting over the right lower lung zone. There is an additional opacity projecting over the posterior tenth rib on the left. There is no pneumothorax. No large pleural effusion. There is no acute osseous abnormality. The lungs are hyperexpanded. IMPRESSION: 1. No definite acute cardiopulmonary process. 2. Bilateral pulmonary opacities as detailed above. The opacity on the patient's right is favored to represent a nipple shadow, while the left opacity may represent a focal infiltrate or area of atelectasis. A two-view follow-up chest x-ray is recommended in 4-6 weeks to confirm stability or resolution of both of these opacities. 3. Hyperexpanded lungs which can be seen in patients with COPD. Electronically Signed   By: Constance Holster M.D.   On: 08/19/2019 18:27   MR Brain W and Wo Contrast  Result Date: 09/02/2019 CLINICAL DATA:  Dizziness for 3 weeks. History of breast carcinoma. EXAM: MRI HEAD WITHOUT AND WITH CONTRAST TECHNIQUE: Multiplanar, multiecho pulse sequences of the brain and surrounding structures were obtained without and with intravenous contrast. CONTRAST:  3.38mL GADAVIST GADOBUTROL 1 MMOL/ML IV SOLN COMPARISON:  None. FINDINGS: Brain: There is a mass centered within the right cerebellum that measures 5.6 x 3.5 x 3.3 cm this causes vasogenic edema throughout the right cerebellar hemisphere with mass effect on the fourth  ventricle and cerebral aqua duct. There is resultant moderate obstructive hydrocephalus of the lateral and third ventricles. There is periventricular hyperintense T2-weighted signal consistent with transependymal interstitial edema. There are no other contrast-enhancing lesions identified. Vascular: Normal flow voids. Skull and upper cervical spine: Normal marrow signal. Sinuses/Orbits: Negative. Other: None. IMPRESSION: 1. Mass centered within the right cerebellum measuring 5.6 x 3.5 x 3.3 cm with vasogenic edema throughout the right cerebellar hemisphere and mass effect  on the fourth ventricle and cerebral aqueduct. 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 2. Moderate obstructive hydrocephalus of the lateral and third ventricles with transependymal interstitial edema. 3. Critical Value/emergent results were called by telephone at the time of interpretation on 09/02/2019 at 11:02 pm to provider [facility] , who verbally acknowledged these results. Electronically Signed   By: Ulyses Jarred M.D.   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  There is accompanying hydrocephalus.  It is unclear whether mass effect or hydrocephalus are contributing to the bulk of her symptomatology.  Details surrounding history are not well defined.   She will almost certainly require surgical decompression given the size of the mass, obstructive hydrocephalus, clinical syndrome.  Neurosurgery is aware of the case and is awaiting her arrival at [facility].  We will add her to our brain/spine tumor board list for discussion and evaluation and further treatment stratification once pathology returns and patient recovers from surgery.  Should remain on high dose dexamethasone 4mg  q6 in the meantime.    All questions were answered. The patient may call  the clinic with any problems, questions or concerns.  The total time spent in the encounter was 55 minutes and more than 50% was on counseling and review of test results     Ventura Sellers, MD 09/03/2019 5:01 PM

## 2019-09-03 NOTE — ED Notes (Signed)
Pt resting in bed at this time. NAD noted °

## 2019-09-03 NOTE — ED Notes (Signed)
[facility] notified need for transport 

## 2019-09-03 NOTE — ED Notes (Addendum)
Pt repositioned in bed. Pt placed back on monitor. Pt updated on plan of care. NAD noted. Bed alarm on and call bell within reach

## 2019-09-03 NOTE — Progress Notes (Addendum)
Patient seen at [facility]. She  Is alert, conversant, denies pain.  Vitals stable. Neurosurgery Dr Annette Stable Informed of patient arrival to [facility]. Rest of plan per Dr Danella Maiers note. Dr Mickeal Skinner Note reviewed. He recommends High dose Dexamethasone 4 mg every 6 hours, I have change order to reflect his recommendations. Will Add SSI, and monitor CBG, now that patient will be on high dose steroids. IV PPI prophylaxis.   Niel Hummer, MD.

## 2019-09-03 NOTE — Progress Notes (Signed)
Care started prior to midnight in the emergency room patient was admitted early this morning after midnight by my partner and colleague Dr. Ileene Musa and I am in current agreement with her assessment and plan.  Additional changes to the plan of care been made accordingly.  Patient is a extremely thin and cachectic 44 year old female with a past medical history significant for but not limited to breast cancer status post left mastectomy, chemotherapy as well as radiation as well as history of chemo-induced neuropathy, history of cocaine abuse, PTSD as well as other comorbidities who presents with chief complaint of unsteady gait and headache.  She is noted for the last 3 to 4 weeks that she has been having almost constant throbbing and sharp bilateral temporal headaches initially after noted worsening gait with associated multiple falls over the last few weeks.  She does have some chronic lower weakness of extremities but ambulates without assistance and also now notes new urinary incontinence but no increased frequency urgency or dysuria.  Because of the symptoms she presented to the emergency room and she was worked up with a MRI brain which was notable for a mass within the right cerebellum causing vasogenic edema and mass-effect on the fourth ventricle and cerebral aqueduct causing moderate obstructive hydrocephalus.  CBC and CMP were unremarkable and UA did show moderate leukocytes, positive nitrites and many bacteria.  The ED physician discussed these findings with neurosurgery and they recommended the patient be transferred to [facility] and started Decadron as they will consult on her. 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 Lindi Adie as a courtesy via Epic -Consulted Neuro-Oncology Dr. Mickeal Skinner for further evaluation -Further recommendations per neuro oncology, medical oncology as well as neurosurgery  Suspected UTI -Urinalysis showed cloudy urine with amber color, small hemoglobin on the urine dipstick, moderate leukocytes, positive nitrites, pH was 5.0, protein was 100, specific gravity is 1.035, there is many bacteria, greater than 50 RBCs per high-power field, greater than 50 WBCs however she did have 21-50 squamous epithelial cells so was not a clean-catch -Incontinence could be due to hydrocephalus but will start Rocephin for now pending urine culture  Failure to Thrive/Cachexia/Severe Protein Calorie Malnutrtion -Likely due to malignancy -Consult Dietitian -She takes Ensure 237 mL daily  Hx of Breast Cancer s/p Left Mastectomy  -Suspect brain mass could be primary metastasis -Have notified her primary oncologist Dr. Lindi Adie via Epic  -No longer taking Tamoxifen 20 mg pO BID -Continue with supportive care no longer taking ondansetron or the Compazine  Hyperglycemia -Likely in the setting of steroids and steroid demargination -Check hemoglobin A1c to rule out diabetes component -Continue monitor blood sugars carefully and they have been ranging from 104-1 43 on daily BMP/CMP's -If necessary will place on sensitive NovoLog/scale insulin  Anxiety/Depression/PTSD/Bipolar Disorder -Resumed Alprazolam 0.5 mg po BID -Receives Risperidone microspheres injections 15 mg every 2 weeks -no longer taking the gabapentin or the lorazepam on her MAR  We will continue to monitor patient's clinical response to intervention and repeat blood work in the a.m. and follow-up on Specialist recommendations.

## 2019-09-03 NOTE — ED Notes (Signed)
Upon going into pts room, Pt states" I need a cigarette" Pt informed that she cannot smoke at this time. Pt offered a nicotine patch . Pt declines nicotine patch

## 2019-09-03 NOTE — ED Notes (Signed)
Pt provided with meal tray at this time. NAD noted. Pt alert at this time

## 2019-09-04 ENCOUNTER — Inpatient Hospital Stay (HOSPITAL_COMMUNITY): Payer: Medicaid Other

## 2019-09-04 ENCOUNTER — Inpatient Hospital Stay (HOSPITAL_COMMUNITY): Payer: Medicaid Other | Admitting: Anesthesiology

## 2019-09-04 ENCOUNTER — Encounter (HOSPITAL_COMMUNITY): Payer: Self-pay | Admitting: Family Medicine

## 2019-09-04 ENCOUNTER — Inpatient Hospital Stay (HOSPITAL_COMMUNITY)
Admission: EM | Disposition: A | Discharge status: Home / Self Care | Payer: Self-pay | Source: Home / Self Care | Attending: Internal Medicine

## 2019-09-04 ENCOUNTER — Telehealth: Payer: Self-pay | Admitting: *Deleted

## 2019-09-04 ENCOUNTER — Other Ambulatory Visit: Payer: Self-pay

## 2019-09-04 HISTORY — PX: RETROSIGMOID CRANIECTOMY FOR TUMOR RESECTION: SHX6072

## 2019-09-04 LAB — GLUCOSE, CAPILLARY
Glucose-Capillary: 123 mg/dL — ABNORMAL HIGH (ref 70–99)
Glucose-Capillary: 99 mg/dL (ref 70–99)

## 2019-09-04 LAB — ABO/RH: ABO/RH(D): A POS

## 2019-09-04 LAB — URINE CULTURE

## 2019-09-04 LAB — PREPARE RBC (CROSSMATCH)

## 2019-09-04 LAB — HEMOGLOBIN A1C
Hgb A1c MFr Bld: 6.3 % — ABNORMAL HIGH (ref 4.8–5.6)
Mean Plasma Glucose: 134.11 mg/dL

## 2019-09-04 LAB — MRSA PCR SCREENING: MRSA by PCR: NEGATIVE

## 2019-09-04 IMAGING — DX DG CHEST 1V PORT
1 series · 1 of 1 positions shown · non-contrast
Comparison: [DATE], CT [DATE]

CLINICAL DATA: Central line placement

EXAM:
PORTABLE CHEST 1 VIEW

[chest ap]
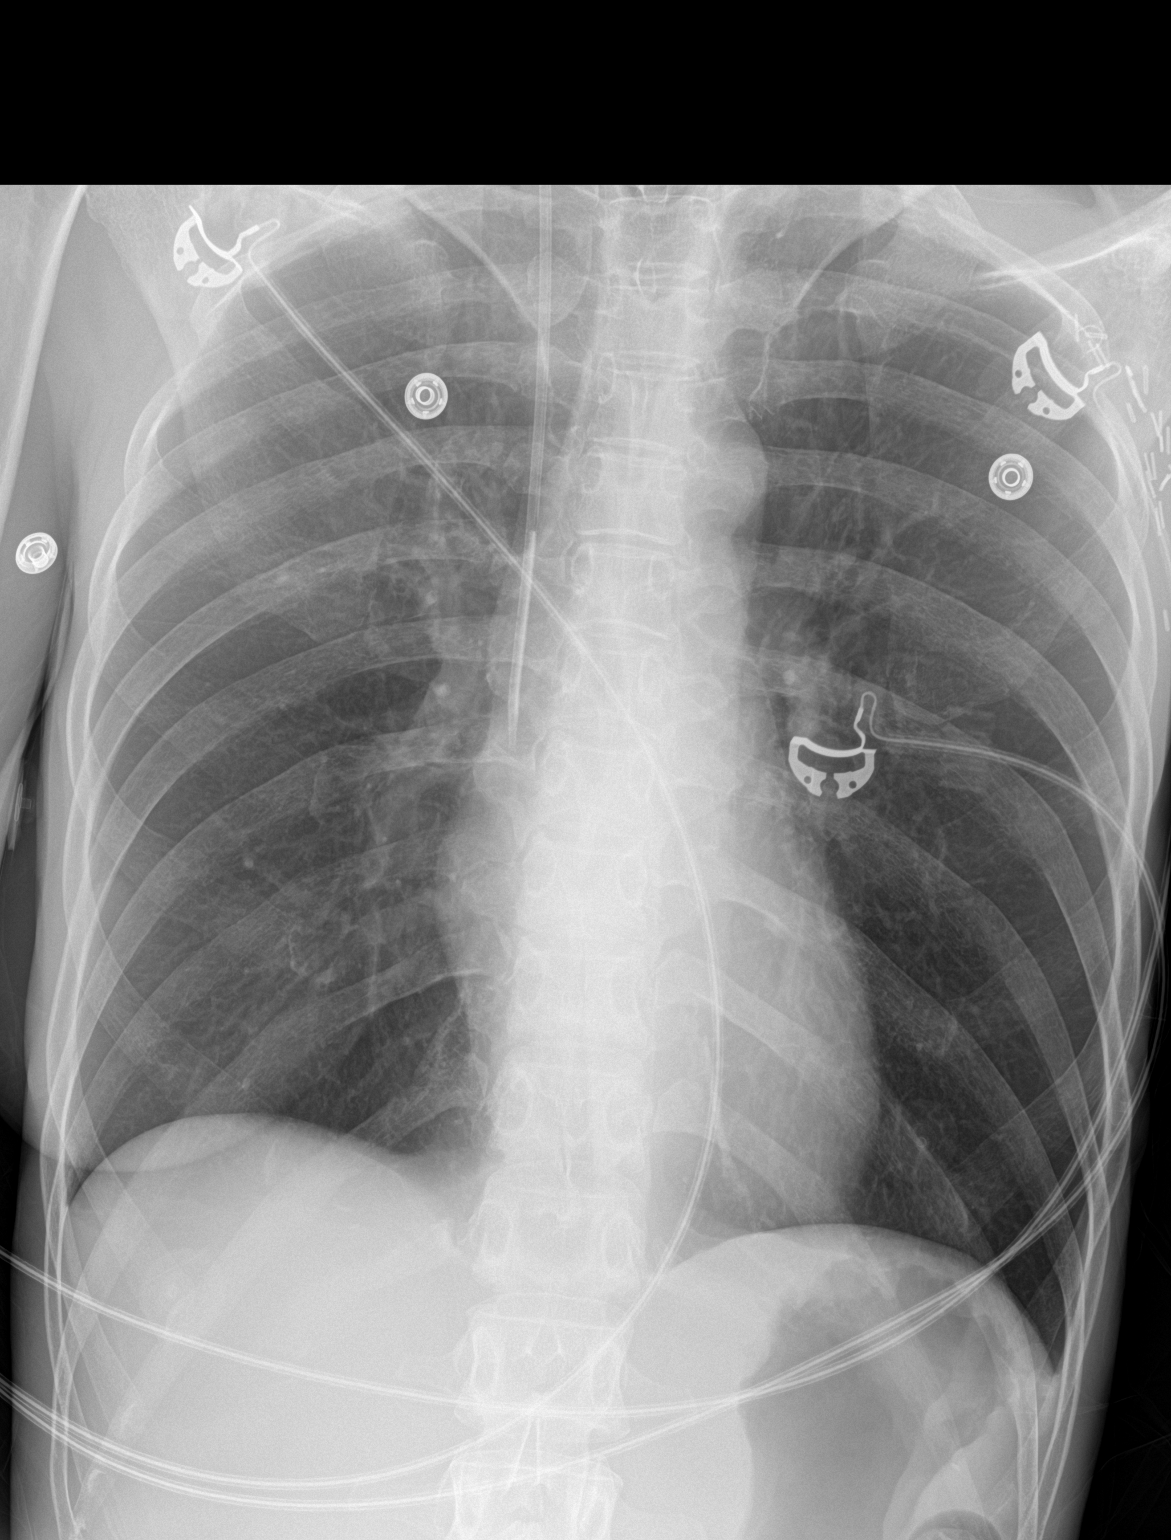

[1 of 1 positions shown; findings below may reference images not displayed]

FINDINGS: Hyperinflation. Right-sided central venous catheter with tip
projecting over the SVC. More radiopaque portion of tubing at the
tip and extending 5 cm proximal to the tip. No pneumothorax. Clear
lung fields. Normal heart size. Clips in the left axilla. Post
mastectomy changes on the left
IMPRESSION: Right-sided central venous catheter tip overlies the distal SVC. No
pneumothorax.

## 2019-09-04 SURGERY — RETROSIGMOID CRANIECTOMY FOR TUMOR RESECTION
Anesthesia: General | Site: Head | Laterality: Right

## 2019-09-04 MED ORDER — DEXAMETHASONE SODIUM PHOSPHATE 10 MG/ML IJ SOLN
INTRAMUSCULAR | Status: AC
  Filled 2019-09-04: qty 1

## 2019-09-04 MED ORDER — PHENYLEPHRINE 40 MCG/ML (10ML) SYRINGE FOR IV PUSH (FOR BLOOD PRESSURE SUPPORT)
PREFILLED_SYRINGE | INTRAVENOUS | Status: AC
  Filled 2019-09-04: qty 10

## 2019-09-04 MED ORDER — ONDANSETRON HCL 4 MG/2ML IJ SOLN: 4.0000 mg | INTRAMUSCULAR | Status: DC | PRN

## 2019-09-04 MED ORDER — PANTOPRAZOLE SODIUM 40 MG IV SOLR: 40.0000 mg | Freq: Every day | INTRAVENOUS | Status: DC

## 2019-09-04 MED ORDER — MIDAZOLAM HCL 2 MG/2ML IJ SOLN
INTRAMUSCULAR | Status: AC
  Filled 2019-09-04: qty 2

## 2019-09-04 MED ORDER — PHENYLEPHRINE 40 MCG/ML (10ML) SYRINGE FOR IV PUSH (FOR BLOOD PRESSURE SUPPORT)
PREFILLED_SYRINGE | INTRAVENOUS | Status: DC | PRN
  Administered 2019-09-04: 40 ug via INTRAVENOUS
  Administered 2019-09-04: 80 ug via INTRAVENOUS

## 2019-09-04 MED ORDER — MIDAZOLAM HCL 2 MG/2ML IJ SOLN: 1.0000 mg | Freq: Once | INTRAMUSCULAR | Status: DC

## 2019-09-04 MED ORDER — ADULT MULTIVITAMIN W/MINERALS CH
1.0000 | ORAL_TABLET | Freq: Every day | ORAL | Status: DC
  Administered 2019-09-05 – 2019-09-07 (×3): 1 via ORAL
  Filled 2019-09-04 (×3): qty 1

## 2019-09-04 MED ORDER — OXYCODONE HCL 5 MG/5ML PO SOLN: 5.0000 mg | Freq: Once | ORAL | Status: DC | PRN

## 2019-09-04 MED ORDER — EPHEDRINE SULFATE 50 MG/ML IJ SOLN
INTRAMUSCULAR | Status: DC | PRN
  Administered 2019-09-04: 5 mg via INTRAVENOUS

## 2019-09-04 MED ORDER — PROPOFOL 1000 MG/100ML IV EMUL
INTRAVENOUS | Status: AC
  Filled 2019-09-04: qty 100

## 2019-09-04 MED ORDER — SODIUM CHLORIDE 0.9 % IV SOLN: INTRAVENOUS | Status: DC

## 2019-09-04 MED ORDER — SUGAMMADEX SODIUM 200 MG/2ML IV SOLN
INTRAVENOUS | Status: DC | PRN
  Administered 2019-09-04: 140 mg via INTRAVENOUS

## 2019-09-04 MED ORDER — MIDAZOLAM HCL 5 MG/5ML IJ SOLN
INTRAMUSCULAR | Status: DC | PRN
  Administered 2019-09-04 (×2): 1 mg via INTRAVENOUS

## 2019-09-04 MED ORDER — PROMETHAZINE HCL 25 MG/ML IJ SOLN: 6.2500 mg | INTRAMUSCULAR | Status: DC | PRN

## 2019-09-04 MED ORDER — BACITRACIN ZINC 500 UNIT/GM EX OINT
TOPICAL_OINTMENT | CUTANEOUS | Status: DC | PRN
  Administered 2019-09-04: 1 via TOPICAL

## 2019-09-04 MED ORDER — LABETALOL HCL 5 MG/ML IV SOLN: 10.0000 mg | INTRAVENOUS | Status: DC | PRN

## 2019-09-04 MED ORDER — ROCURONIUM BROMIDE 10 MG/ML (PF) SYRINGE
PREFILLED_SYRINGE | INTRAVENOUS | Status: DC | PRN
  Administered 2019-09-04: 20 mg via INTRAVENOUS
  Administered 2019-09-04: 40 mg via INTRAVENOUS
  Administered 2019-09-04: 20 mg via INTRAVENOUS

## 2019-09-04 MED ORDER — OXYCODONE HCL 5 MG PO TABS: 5.0000 mg | ORAL_TABLET | Freq: Once | ORAL | Status: DC | PRN

## 2019-09-04 MED ORDER — LIDOCAINE 2% (20 MG/ML) 5 ML SYRINGE
INTRAMUSCULAR | Status: AC
  Filled 2019-09-04: qty 5

## 2019-09-04 MED ORDER — CEFAZOLIN SODIUM 1 G IJ SOLR
INTRAMUSCULAR | Status: AC
  Filled 2019-09-04: qty 20

## 2019-09-04 MED ORDER — PROPOFOL 10 MG/ML IV BOLUS
INTRAVENOUS | Status: DC | PRN
  Administered 2019-09-04: 80 mg via INTRAVENOUS

## 2019-09-04 MED ORDER — PHENYLEPHRINE HCL-NACL 10-0.9 MG/250ML-% IV SOLN
INTRAVENOUS | Status: DC | PRN
  Administered 2019-09-04: 30 ug/min via INTRAVENOUS

## 2019-09-04 MED ORDER — BACITRACIN ZINC 500 UNIT/GM EX OINT
TOPICAL_OINTMENT | CUTANEOUS | Status: AC
  Filled 2019-09-04: qty 28.35

## 2019-09-04 MED ORDER — MEPERIDINE HCL 25 MG/ML IJ SOLN: 6.2500 mg | INTRAMUSCULAR | Status: DC | PRN

## 2019-09-04 MED ORDER — ONDANSETRON HCL 4 MG/2ML IJ SOLN
INTRAMUSCULAR | Status: DC | PRN
  Administered 2019-09-04: 4 mg via INTRAVENOUS

## 2019-09-04 MED ORDER — CEFAZOLIN SODIUM-DEXTROSE 1-4 GM/50ML-% IV SOLN: 1.0000 g | Freq: Three times a day (TID) | INTRAVENOUS | Status: DC

## 2019-09-04 MED ORDER — DEXAMETHASONE SODIUM PHOSPHATE 10 MG/ML IJ SOLN
INTRAMUSCULAR | Status: DC | PRN
  Administered 2019-09-04: 10 mg via INTRAVENOUS

## 2019-09-04 MED ORDER — FENTANYL CITRATE (PF) 250 MCG/5ML IJ SOLN
INTRAMUSCULAR | Status: AC
  Filled 2019-09-04: qty 5

## 2019-09-04 MED ORDER — 0.9 % SODIUM CHLORIDE (POUR BTL) OPTIME
TOPICAL | Status: DC | PRN
  Administered 2019-09-04 (×3): 1000 mL

## 2019-09-04 MED ORDER — HYDROMORPHONE HCL 1 MG/ML IJ SOLN: 0.2500 mg | INTRAMUSCULAR | Status: DC | PRN

## 2019-09-04 MED ORDER — FENTANYL CITRATE (PF) 250 MCG/5ML IJ SOLN
INTRAMUSCULAR | Status: DC | PRN
  Administered 2019-09-04: 25 ug via INTRAVENOUS
  Administered 2019-09-04: 75 ug via INTRAVENOUS
  Administered 2019-09-04: 50 ug via INTRAVENOUS

## 2019-09-04 MED ORDER — MIDAZOLAM HCL 2 MG/2ML IJ SOLN
INTRAMUSCULAR | Status: AC
  Administered 2019-09-04: 1 mg
  Filled 2019-09-04: qty 2

## 2019-09-04 MED ORDER — DIPHENHYDRAMINE HCL 50 MG/ML IJ SOLN
INTRAMUSCULAR | Status: AC
  Filled 2019-09-04: qty 1

## 2019-09-04 MED ORDER — THROMBIN 5000 UNITS EX SOLR
CUTANEOUS | Status: AC
  Filled 2019-09-04: qty 10000

## 2019-09-04 MED ORDER — LIDOCAINE-EPINEPHRINE 1 %-1:100000 IJ SOLN
INTRAMUSCULAR | Status: DC | PRN
  Administered 2019-09-04: 10 mL

## 2019-09-04 MED ORDER — SODIUM CHLORIDE 0.9 % IV SOLN: INTRAVENOUS | Status: DC | PRN

## 2019-09-04 MED ORDER — FENTANYL CITRATE (PF) 100 MCG/2ML IJ SOLN
INTRAMUSCULAR | Status: AC
  Filled 2019-09-04: qty 2

## 2019-09-04 MED ORDER — THROMBIN 20000 UNITS EX SOLR: CUTANEOUS | Status: DC | PRN

## 2019-09-04 MED ORDER — ALBUMIN HUMAN 5 % IV SOLN
INTRAVENOUS | Status: DC | PRN
Start: 2019-09-04 — End: 2019-09-04

## 2019-09-04 MED ORDER — THROMBIN 5000 UNITS EX SOLR: OROMUCOSAL | Status: DC | PRN

## 2019-09-04 MED ORDER — ROCURONIUM BROMIDE 10 MG/ML (PF) SYRINGE
PREFILLED_SYRINGE | INTRAVENOUS | Status: AC
  Filled 2019-09-04: qty 10

## 2019-09-04 MED ORDER — EPHEDRINE 5 MG/ML INJ
INTRAVENOUS | Status: AC
  Filled 2019-09-04: qty 10

## 2019-09-04 MED ORDER — SODIUM CHLORIDE 0.9 % IV SOLN
INTRAVENOUS | Status: DC | PRN
Start: 2019-09-04 — End: 2019-09-04

## 2019-09-04 MED ORDER — PROMETHAZINE HCL 12.5 MG PO TABS
12.5000 mg | ORAL_TABLET | ORAL | Status: DC | PRN
  Filled 2019-09-04: qty 2

## 2019-09-04 MED ORDER — ONDANSETRON HCL 4 MG/2ML IJ SOLN
INTRAMUSCULAR | Status: AC
  Filled 2019-09-04: qty 2

## 2019-09-04 MED ORDER — LIDOCAINE-EPINEPHRINE 1 %-1:100000 IJ SOLN
INTRAMUSCULAR | Status: AC
  Filled 2019-09-04: qty 1

## 2019-09-04 MED ORDER — ONDANSETRON HCL 4 MG PO TABS: 4.0000 mg | ORAL_TABLET | ORAL | Status: DC | PRN

## 2019-09-04 MED ORDER — HYDROMORPHONE HCL 1 MG/ML IJ SOLN
0.5000 mg | INTRAMUSCULAR | Status: DC | PRN
  Administered 2019-09-04: 1 mg via INTRAVENOUS
  Administered 2019-09-05 (×4): 0.5 mg via INTRAVENOUS
  Administered 2019-09-05: 1 mg via INTRAVENOUS
  Administered 2019-09-06 (×2): 0.5 mg via INTRAVENOUS
  Administered 2019-09-06 (×2): 1 mg via INTRAVENOUS
  Administered 2019-09-06: 0.5 mg via INTRAVENOUS
  Filled 2019-09-04 (×11): qty 1

## 2019-09-04 MED ORDER — ENSURE ENLIVE PO LIQD
237.0000 mL | Freq: Four times a day (QID) | ORAL | Status: DC
  Administered 2019-09-05 – 2019-09-07 (×9): 237 mL via ORAL

## 2019-09-04 MED ORDER — PROPOFOL 500 MG/50ML IV EMUL
INTRAVENOUS | Status: DC | PRN
  Administered 2019-09-04: 75 ug/kg/min via INTRAVENOUS

## 2019-09-04 MED ORDER — CEFAZOLIN SODIUM-DEXTROSE 2-4 GM/100ML-% IV SOLN
2.0000 g | INTRAVENOUS | Status: AC
  Administered 2019-09-04: 2 g via INTRAVENOUS

## 2019-09-04 MED ORDER — HEMOSTATIC AGENTS (NO CHARGE) OPTIME
TOPICAL | Status: DC | PRN
  Administered 2019-09-04 (×2): 1 via TOPICAL

## 2019-09-04 MED ORDER — THROMBIN 20000 UNITS EX SOLR
CUTANEOUS | Status: AC
  Filled 2019-09-04: qty 20000

## 2019-09-04 MED ORDER — LIDOCAINE 2% (20 MG/ML) 5 ML SYRINGE
INTRAMUSCULAR | Status: DC | PRN
  Administered 2019-09-04: 40 mg via INTRAVENOUS

## 2019-09-04 SURGICAL SUPPLY — 81 items
BAG DECANTER FOR FLEXI CONT (MISCELLANEOUS) ×3 IMPLANT
BAND RUBBER #18 3X1/16 STRL (MISCELLANEOUS) ×4 IMPLANT
BLADE CLIPPER SURG (BLADE) ×3 IMPLANT
BNDG COHESIVE 4X5 TAN STRL (GAUZE/BANDAGES/DRESSINGS) ×1 IMPLANT
BUR ACORN 6.0 PRECISION (BURR) ×2 IMPLANT
BUR ACORN 6.0MM PRECISION (BURR) ×1
BUR SPIRAL ROUTER 2.3 (BUR) ×1 IMPLANT
BUR SPIRAL ROUTER 2.3MM (BUR) ×1
CANISTER SUCT 3000ML PPV (MISCELLANEOUS) ×6 IMPLANT
CARTRIDGE OIL MAESTRO DRILL (MISCELLANEOUS) ×1 IMPLANT
CATH FOLEY 2WAY SLVR  5CC 12FR (CATHETERS) ×3
CATH FOLEY 2WAY SLVR 5CC 12FR (CATHETERS) IMPLANT
CLIP VESOCCLUDE MED 6/CT (CLIP) ×1 IMPLANT
CNTNR URN SCR LID CUP LEK RST (MISCELLANEOUS) ×1 IMPLANT
CONT SPEC 4OZ STRL OR WHT (MISCELLANEOUS) ×3
COVER WAND RF STERILE (DRAPES) ×1 IMPLANT
DIFFUSER DRILL AIR PNEUMATIC (MISCELLANEOUS) ×3 IMPLANT
DRAPE CAMERA VIDEO/LASER (DRAPES) IMPLANT
DRAPE MICROSCOPE LEICA (MISCELLANEOUS) ×3 IMPLANT
DRAPE NEUROLOGICAL W/INCISE (DRAPES) ×3 IMPLANT
DRAPE STERI IOBAN 125X83 (DRAPES) IMPLANT
DRAPE SURG 17X23 STRL (DRAPES) IMPLANT
DRAPE WARM FLUID 44X44 (DRAPES) ×3 IMPLANT
ELECT CAUTERY BLADE 6.4 (BLADE) ×1 IMPLANT
ELECT REM PT RETURN 9FT ADLT (ELECTROSURGICAL) ×3
ELECTRODE REM PT RTRN 9FT ADLT (ELECTROSURGICAL) ×1 IMPLANT
GAUZE 4X4 16PLY RFD (DISPOSABLE) IMPLANT
GAUZE SPONGE 4X4 12PLY STRL (GAUZE/BANDAGES/DRESSINGS) ×3 IMPLANT
GLOVE BIO SURGEON STRL SZ 6.5 (GLOVE) ×1 IMPLANT
GLOVE BIO SURGEONS STRL SZ 6.5 (GLOVE) ×1
GLOVE BIOGEL PI IND STRL 6.5 (GLOVE) IMPLANT
GLOVE BIOGEL PI IND STRL 7.5 (GLOVE) IMPLANT
GLOVE BIOGEL PI IND STRL 8 (GLOVE) IMPLANT
GLOVE BIOGEL PI INDICATOR 6.5 (GLOVE) ×4
GLOVE BIOGEL PI INDICATOR 7.5 (GLOVE) ×4
GLOVE BIOGEL PI INDICATOR 8 (GLOVE) ×4
GLOVE ECLIPSE 7.5 STRL STRAW (GLOVE) ×4 IMPLANT
GLOVE ECLIPSE 9.0 STRL (GLOVE) ×7 IMPLANT
GLOVE EXAM NITRILE XL STR (GLOVE) IMPLANT
GLOVE SURG SS PI 6.0 STRL IVOR (GLOVE) ×6 IMPLANT
GOWN STRL REUS W/ TWL LRG LVL3 (GOWN DISPOSABLE) IMPLANT
GOWN STRL REUS W/ TWL XL LVL3 (GOWN DISPOSABLE) IMPLANT
GOWN STRL REUS W/TWL 2XL LVL3 (GOWN DISPOSABLE) ×4 IMPLANT
GOWN STRL REUS W/TWL LRG LVL3 (GOWN DISPOSABLE) ×6
GOWN STRL REUS W/TWL XL LVL3 (GOWN DISPOSABLE)
GRAFT DURAGEN MATRIX 3WX3L (Graft) ×3 IMPLANT
GRAFT DURAGEN MATRIX 3X3 SNGL (Graft) IMPLANT
HEMOSTAT POWDER KIT SURGIFOAM (HEMOSTASIS) ×4 IMPLANT
HEMOSTAT SURGICEL 2X14 (HEMOSTASIS) ×3 IMPLANT
KIT BASIN OR (CUSTOM PROCEDURE TRAY) ×3 IMPLANT
KIT TURNOVER KIT B (KITS) ×3 IMPLANT
MESH DYNAMIC MALL MED 1.5 (Mesh General) ×2 IMPLANT
NDL HYPO 18GX1.5 BLUNT FILL (NEEDLE) IMPLANT
NDL HYPO 25X1 1.5 SAFETY (NEEDLE) ×1 IMPLANT
NEEDLE HYPO 18GX1.5 BLUNT FILL (NEEDLE) IMPLANT
NEEDLE HYPO 25X1 1.5 SAFETY (NEEDLE) ×3 IMPLANT
NS IRRIG 1000ML POUR BTL (IV SOLUTION) ×8 IMPLANT
OIL CARTRIDGE MAESTRO DRILL (MISCELLANEOUS) ×3
PACK BATTERY CMF DISP FOR DVR (ORTHOPEDIC DISPOSABLE SUPPLIES) ×2 IMPLANT
PACK CRANIOTOMY CUSTOM (CUSTOM PROCEDURE TRAY) ×3 IMPLANT
PAD ARMBOARD 7.5X6 YLW CONV (MISCELLANEOUS) ×9 IMPLANT
PATTIES SURGICAL .25X.25 (GAUZE/BANDAGES/DRESSINGS) IMPLANT
PATTIES SURGICAL .5 X.5 (GAUZE/BANDAGES/DRESSINGS) IMPLANT
PATTIES SURGICAL .5 X3 (DISPOSABLE) IMPLANT
PATTIES SURGICAL 1X1 (DISPOSABLE) IMPLANT
SCREW UNIII AXS SD 1.5X4 (Screw) ×8 IMPLANT
SEALANT ADHERUS EXTEND TIP (MISCELLANEOUS) ×2 IMPLANT
SPONGE NEURO XRAY DETECT 1X3 (DISPOSABLE) IMPLANT
SPONGE SURGIFOAM ABS GEL 100 (HEMOSTASIS) ×3 IMPLANT
STAPLER VISISTAT 35W (STAPLE) ×3 IMPLANT
SUT ETHILON 3 0 FSL (SUTURE) ×2 IMPLANT
SUT NURALON 4 0 TR CR/8 (SUTURE) ×7 IMPLANT
SUT VIC AB 2-0 CT2 18 VCP726D (SUTURE) ×6 IMPLANT
SYR CONTROL 10ML LL (SYRINGE) ×1 IMPLANT
TAPE CLOTH SURG 4X10 WHT LF (GAUZE/BANDAGES/DRESSINGS) ×2 IMPLANT
TOWEL GREEN STERILE (TOWEL DISPOSABLE) ×3 IMPLANT
TOWEL GREEN STERILE FF (TOWEL DISPOSABLE) ×3 IMPLANT
TRAY FOLEY MTR SLVR 16FR STAT (SET/KITS/TRAYS/PACK) ×3 IMPLANT
UNDERPAD 30X36 HEAVY ABSORB (UNDERPADS AND DIAPERS) ×1 IMPLANT
WATER STERILE IRR 1000ML POUR (IV SOLUTION) ×3 IMPLANT
WIPE CHG CHLORHEXIDINE 2% (PERSONAL CARE ITEMS) ×2 IMPLANT

## 2019-09-04 NOTE — Op Note (Addendum)
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  Asst. Surgeon: Reinaldo Meeker, NP  Anesthesia: General  Indication: 44 year old female with past history of breast carcinoma status postmastectomy, chemotherapy and radiation presents with headaches and gait instability.  Work-up demonstrates evidence of a large right posterior fossa mass which appears to be attached to the tentorial dura and petrous dura on the right side with invasion in her right cerebellar hemisphere and marked mass-effect with associated hydrocephalus.  Patient presents now for craniectomy and resection of tumor.  Operative note: After induction anesthesia, patient positioned in the left lateral decubitus position and appropriately padded.  Patient's head fixed in a flexed position using [place] pin head holder.  Patient's right occipital and suboccipital region prepped and draped sterilely.  Incision made.  Medially on the right side.  This carried down sharply to the pericranium.  Dissection then proceeded down to the level of the foramen magnum.  Craniectomy then performed using high-speed drill extending from the transverse sinus junction with the torcula toward midline and extending laterally to the sigmoid sinus and inferiorly to the foramen magnum.  Dura was opened in a cruciate fashion.  The tumor was identified.  A microscope was used for microdissection of the tumor and its borders.  Plane was developed along the inferior aspect of the tumor and cerebellar hemisphere.  This was carried down deeply and laterally.  The tumor was then guttedand debulked.  Eventually with the tumor was a more manageable size dissection was then performed along the tentorial dura and  the petrous dura.  All elements of the tumor were eventually resected.  There was no evidence of venous sinus injury.  There was no evidence of gross residual tumor although certainly given the attachment to the dura I think it is certain that there is some residual tumor cells present.  Bleeding points around along the cerebellar hemisphere were controlled with electrocautery.  Surgicel was placed over the cerebellar hemisphere.  Wound was irrigated one final time and found to be free from any active hemorrhage.  Dura was reapproximated with 4-0 Nurolon.  DuraGen sponge was placed over the dural repair.   A small piece of mesh was then placed over the craniectomy and then the wound was then closed in layers with Vicryl sutures and a 3-0 nylon suture at the surface.  There were no apparent complications.  Patient tolerated procedure well and she returned to the recovery room postop.

## 2019-09-04 NOTE — Telephone Encounter (Signed)
Called pt friend Maryfrances Bunnell, advised to call office upon pt discharge so that we may make appt for pt to be seen by physician. Pt friend verbalized understanding

## 2019-09-04 NOTE — Anesthesia Procedure Notes (Addendum)
Central Venous Catheter Insertion Performed by: Pervis Hocking, DO, anesthesiologist Start/End5/26/2021 2:10 PM, 09/04/2019 2:30 PM Patient location: Pre-op. Preanesthetic checklist: patient identified, IV checked, site marked, risks and benefits discussed, surgical consent, monitors and equipment checked, pre-op evaluation, timeout performed and anesthesia consent Position: Trendelenburg Lidocaine 1% used for infiltration and patient sedated Hand hygiene performed , maximum sterile barriers used  and Seldinger technique used Catheter size: 8 Fr Total catheter length 16. Central line was placed.Double lumen Procedure performed using ultrasound guided technique. Ultrasound Notes:anatomy identified, needle tip was noted to be adjacent to the nerve/plexus identified, no ultrasound evidence of intravascular and/or intraneural injection and image(s) printed for medical record Attempts: 1 Following insertion, dressing applied, line sutured and Biopatch. Post procedure assessment: blood return through all ports  Patient tolerated the procedure well with no immediate complications.

## 2019-09-04 NOTE — Transfer of Care (Signed)
Immediate Anesthesia Transfer of Care Note  Patient: Diane Rosario  Procedure(s) Performed: SUBOCCIPITAL  CRANIECTOMY FOR TUMOR RESECTION (Right Head)  Patient Location: PACU  Anesthesia Type:General  Level of Consciousness: Drowsy but follows commands.    Airway & Oxygen Therapy: Patient Spontanous Breathing and Patient connected to face mask oxygen  Post-op Assessment: Report given to RN and Post -op Vital signs reviewed and stable RR even and  Unlabored  Post vital signs: Reviewed and stable  Last Vitals:  Vitals Value Taken Time  BP 129/65 09/04/19 1832  Temp 36.2 C 09/04/19 1835  Pulse 69 09/04/19 1843  Resp 18 09/04/19 1843  SpO2 100 % 09/04/19 1843  Vitals shown include unvalidated device data.  Last Pain:  Vitals:   09/04/19 0730  TempSrc:   PainSc: 0-No pain         Complications: No apparent anesthesia complications

## 2019-09-04 NOTE — Anesthesia Preprocedure Evaluation (Addendum)
Anesthesia Evaluation  Patient identified by MRN, date of birth, ID band Patient awake    Reviewed: Allergy & Precautions, NPO status , Patient's Chart, lab work & pertinent test results  Airway Mallampati: II  TM Distance: >3 FB Neck ROM: Full    Dental no notable dental hx. (+) Poor Dentition, Missing, Chipped,    Pulmonary COPD, Current Smoker and Patient abstained from smoking.,    Pulmonary exam normal breath sounds clear to auscultation       Cardiovascular negative cardio ROS Normal cardiovascular exam Rhythm:Regular Rate:Normal     Neuro/Psych PSYCHIATRIC DISORDERS Anxiety Depression Bipolar Disorder Brain mets- presented with 3 to 4-week history of increasing gait instability, dizziness and ataxia.  Patient notes some headache.  Denies any focal weakness.  No sensory loss.  MRI scan demonstrates evidence of a large right posterior fossa tumor which appears to be attached to the posterior fossa dura including the inferior tentorial surface and petrosal surface.    GI/Hepatic negative GI ROS, (+)     substance abuse  cocaine use and marijuana use,   Endo/Other  negative endocrine ROS  Renal/GU negative Renal ROS  negative genitourinary   Musculoskeletal negative musculoskeletal ROS (+)   Abdominal Cachectic, malnourished   Peds negative pediatric ROS (+)  Hematology negative hematology ROS (+) hct 40.3   Anesthesia Other Findings Breast ca with mets to brain- status post mastectomy, chemotherapy and radiation therapy for treatment of right-sided breast carcinoma 4 years ago.  Overall very frail appearing. Responds to questions appropriately but minimally.   Reproductive/Obstetrics negative OB ROS                            Anesthesia Physical  Anesthesia Plan  ASA: III  Anesthesia Plan: General   Post-op Pain Management:    Induction: Intravenous  PONV Risk Score and Plan: 2  and Ondansetron, Dexamethasone, Treatment may vary due to age or medical condition and Midazolam  Airway Management Planned: Oral ETT  Additional Equipment: Arterial line, CVP and Ultrasound Guidance Line Placement  Intra-op Plan:   Post-operative Plan: Possible Post-op intubation/ventilation and Extubation in OR  Informed Consent: I have reviewed the patients History and Physical, chart, labs and discussed the procedure including the risks, benefits and alternatives for the proposed anesthesia with the patient or authorized representative who has indicated his/her understanding and acceptance.     Dental advisory given and Consent reviewed with POA  Plan Discussed with: CRNA and Surgeon  Anesthesia Plan Comments: (Central line, arterial line, possibility of blood transfusion and postoperative ventilation d/w patient and mother. 2 units of pRBCs available in blood bank. )       Anesthesia Quick Evaluation

## 2019-09-04 NOTE — Brief Op Note (Signed)
09/04/2019  5:57 PM  PATIENT:  Diane Rosario  44 y.o. female  PRE-OPERATIVE DIAGNOSIS:  Metastatic tumor to brain  POST-OPERATIVE DIAGNOSIS:  Metastatic tumor to brain  PROCEDURE:  Procedure(s): SUBOCCIPITAL  CRANIECTOMY FOR TUMOR RESECTION (Right)  SURGEON:  Surgeon(s) and Role:    * Earnie Larsson, MD - Primary  PHYSICIAN ASSISTANT:   ASSISTANTSMearl Latin   ANESTHESIA:   general  EBL:  150 mL   BLOOD ADMINISTERED:none  DRAINS: none   LOCAL MEDICATIONS USED:  LIDOCAINE   SPECIMEN:  Source of Specimen:  right Posterior Fossa  DISPOSITION OF SPECIMEN:  PATHOLOGY  COUNTS:  YES  TOURNIQUET:  * No tourniquets in log *  DICTATION: .Dragon Dictation  PLAN OF CARE: Admit to inpatient   PATIENT DISPOSITION:  PACU - hemodynamically stable.   Delay start of Pharmacological VTE agent (>24hrs) due to surgical blood loss or risk of bleeding: yes

## 2019-09-04 NOTE — Interval H&P Note (Signed)
History and Physical Interval Note:  09/04/2019 3:08 PM  Diane Rosario  has presented today for surgery, with the diagnosis of Metastatic tumor to brain.  The various methods of treatment have been discussed with the patient and family. After consideration of risks, benefits and other options for treatment, the patient has consented to  Procedure(s): RETROSIGMOID CRANIECTOMY FOR TUMOR RESECTION (Right) as a surgical intervention.  The patient's history has been reviewed, patient examined, no change in status, stable for surgery.  I have reviewed the patient's chart and labs.  Questions were answered to the patient's satisfaction.     Cooper Render Kyarra Vancamp

## 2019-09-04 NOTE — Anesthesia Procedure Notes (Signed)
Arterial Line Insertion Start/End5/26/2021 2:33 PM, 09/04/2019 2:33 PM Performed by: Renato Shin, CRNA, CRNA  Patient location: Pre-op. Preanesthetic checklist: patient identified, IV checked, site marked, risks and benefits discussed, surgical consent, monitors and equipment checked, pre-op evaluation, timeout performed and anesthesia consent Lidocaine 1% used for infiltration Right, radial was placed Catheter size: 20 G Hand hygiene performed , maximum sterile barriers used  and Seldinger technique used Allen's test indicative of satisfactory collateral circulation Attempts: 1 Procedure performed without using ultrasound guided technique. Following insertion, dressing applied and Biopatch. Post procedure assessment: normal  Patient tolerated the procedure well with no immediate complications.

## 2019-09-04 NOTE — Progress Notes (Signed)
Brief oncology note:  We were notified about the patient's admission.  Noted to have a large right cerebellar mass measuring 5.6 x 3.5 x 3.3 cm with vasogenic edema.  Has been seen by neurosurgery and neuro-oncology.  The patient is being taken to the OR today for resection of this mass.  She has been placed on dexamethasone per neuro oncology.  Ms. Kast has not had a visit with medical oncology since 03/27/2018.  She was started on tamoxifen on 08/21/2017 with plan for 10 years of therapy.  Unclear if she has been taking this medication since it has not been prescribed from our office since 09/19/2017.  Chart has been reviewed with Dr. Lindi Adie.  Following hospital discharge, we will get her set up for a PET scan and then visit with Dr. Lindi Adie to discuss the results make further recommendations.  Mikey Bussing, DNP, AGPCNP-BC, AOCNP Mon/Tues/Thurs/Fri 7am-5pm; Off Wednesdays Cell: [phone]

## 2019-09-04 NOTE — Progress Notes (Signed)
Initial Nutrition Assessment  DOCUMENTATION CODES:   Underweight  INTERVENTION:  When pt is no longer NPO:  Ensure Enlive po QID, each supplement provides 350 kcal and 20 grams of protein  Magic cup TID with meals, each supplement provides 290 kcal and 9 grams of protein  MVI daily  NUTRITION DIAGNOSIS:   Underweight related to cancer and cancer related treatments as evidenced by other (comment)(BMI 12.07).    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  Per Neurosurgery, pt to have surgical debunking of the tumor via R-sided suboccipital craniectomy today.   Pt unavailable for RD exam. Suspect pt is malnourished given BMI of 12 and 14% wt loss x 1.5 years (not clinically significant); however, unable to diagnose at this time without detailed diet history and/or nutrition-focused physical exam.   No PO intake documented.  Labs reviewed. CBGs 94-123 Medications reviewed and include: Decadron, Novolog   NUTRITION - FOCUSED PHYSICAL EXAM:  Unable to perform at this time, will attempt at follow-up.  Diet Order:   Diet Order            Diet NPO time specified  Diet effective midnight        Diet regular Room service appropriate? Yes; Fluid consistency: Thin  Diet effective now              EDUCATION NEEDS:   No education needs have been identified at this time  Skin:  Skin Assessment: Reviewed RN Assessment  Last BM:  5/25  Height:   Ht Readings from Last 1 Encounters:  09/02/19 5\' 6"  (1.676 m)    Weight:   Wt Readings from Last 10  Encounters:  09/02/19 33.9 kg  03/27/18 39.7 kg  02/13/18 39.2 kg  01/02/18 38.8 kg  11/21/17 40.8 kg  09/19/17 42.9 kg  08/21/17 42.3 kg  06/14/17 40.7 kg  05/18/17 42.4 kg  05/05/17 41.7 kg    BMI:  Body mass index is 12.07 kg/m.  Estimated Nutritional Needs:   Kcal:  1500-1700  Protein:  75-90 grams  Fluid:  >1.5L/d    Larkin Ina, MS, RD, LDN RD pager number and weekend/on-call pager number located in [place].

## 2019-09-04 NOTE — Progress Notes (Signed)
PROGRESS NOTE    Diane Rosario  [id number] DOB: [birth year] DOA: 09/02/2019 PCP: Department, [facility]    Brief Narrative:  44 year old female with history of left breast cancer status postmastectomy and chemoradiation, chemotherapy-induced neuropathy, PTSD and other comorbidities.  She was thought to be cancer free for last few years.  Presented to the emergency department with unsteady gait and headache.  She had complained of throbbing and sharp bilateral temporal headaches for at least 4 weeks.  In the emergency room, hemodynamically stable, MRI of the brain was notable for a mass within the right cerebellum causing vasogenic edema and mass-effect on the fourth ventricle, moderate obstructive hydrocephalus.   Assessment & Plan:   Active Problems:   Malignant neoplasm of upper-outer quadrant of left breast in female, estrogen receptor positive ([place])   Neoplasm of brain causing mass effect on adjacent structures ([place])   Acute lower UTI   Failure to thrive in adult  Brain tumor with vasogenic edema/mass-effect and obstructive hydrocephalus: Remains on IV steroids.  Neurochecks.  Followed by oncology and neuro-oncology. Seen by neurosurgery, going for surgical debulking of the tumor today.  Suspected UTI present on admission: Urine culture with multiple species.  With no specific growth, Rocephin for 3 days.  History of breast cancer status post left mastectomy: Now with metastatic breast cancer.  Followed by her oncologist.  Anxiety and depression, PTSD and bipolar disorder: Patient on alprazolam that is continued.  She takes risperidone injections 50 mg every 2 weeks.   DVT prophylaxis: SCDs Code Status: Full code Family Communication: None.  Patient stated she is talking to her mother. Disposition Plan: Status is: Inpatient  Remains inpatient appropriate because:Inpatient level of care appropriate due to severity of illness   Dispo: The patient is from: Home               Anticipated d/c is to: Unknown.              Anticipated d/c date is: > 3 days              Patient currently is not medically stable to d/c.          Consultants:   Neurosurgery  Oncology  Procedures:   Surgery  Antimicrobials:  Anti-infectives (From admission, onward)   Start     Dose/Rate Route Frequency Ordered Stop   09/04/19 0749  ceFAZolin (ANCEF) IVPB 2g/100 mL premix     2 g 200 mL/hr over 30 Minutes Intravenous 30 min pre-op 09/04/19 0749     09/03/19 0100  [MAR Hold]  cefTRIAXone (ROCEPHIN) 1 g in sodium chloride 0.9 % 100 mL IVPB     (MAR Hold since Wed 09/04/2019 at 1321.Hold Reason: Transfer to a Procedural area.)   1 g 200 mL/hr over 30 Minutes Intravenous Every 24 hours 09/03/19 0056           Subjective: Examined patient in the morning rounds.  Denied any overnight events.  She was aware that she is going for surgery, she was not very forthcoming on communication.  She states that her mother is also aware about events happening to her.  Objective: Vitals:   09/03/19 2012 09/04/19 0030 09/04/19 0331 09/04/19 0729  BP: 104/81 113/78 114/88 109/69  Pulse: 65 65 (!) 57 (!) 53  Resp:   18 20  Temp: 98.2 F (36.8 C) 97.6 F (36.4 C) 98 F (36.7 C) 98.1 F (36.7 C)  TempSrc: Oral Oral Oral Oral  SpO2: 100%  100% 98% 100%  Weight:      Height:       No intake or output data in the 24 hours ending 09/04/19 1236 Filed Weights   09/02/19 2057  Weight: 33.9 kg    Examination:  General exam: Appears calm and comfortable , chronically sick looking.  Flat affect. Respiratory system: Clear to auscultation. Respiratory effort normal. Cardiovascular system: S1 & S2 heard, RRR. No JVD, murmurs, rubs, gallops or clicks. Gastrointestinal system: Abdomen is nondistended, soft and nontender.  Central nervous system: Alert and oriented. No focal neurological deficits but with flat affect.    Data Reviewed: I have personally reviewed following  labs and imaging studies  CBC: Recent Labs  Lab 09/02/19 2205 09/03/19 0527  WBC 7.8 5.7  NEUTROABS 4.7  --   HGB 14.7 13.4  HCT 45.2 40.3  MCV 102.3* 99.5  PLT 239 [id number]   Basic Metabolic Panel: Recent Labs  Lab 09/02/19 2205 09/03/19 0527  NA 139 141  K 4.2 4.0  CL 101 101  CO2 26 31  GLUCOSE 104* 143*  BUN 23* 22*  CREATININE 0.88 0.83  CALCIUM 9.4 9.5   GFR: Estimated Creatinine Clearance: 46.3 mL/min (by C-G formula based on SCr of 0.83 mg/dL). Liver Function Tests: Recent Labs  Lab 09/02/19 2205  AST 34  ALT 18  ALKPHOS 56  BILITOT 0.4  PROT 7.8  ALBUMIN 4.4   No results for input(s): LIPASE, AMYLASE in the last 168 hours. No results for input(s): AMMONIA in the last 168 hours. Coagulation Profile: No results for input(s): INR, PROTIME in the last 168 hours. Cardiac Enzymes: No results for input(s): CKTOTAL, CKMB, CKMBINDEX, TROPONINI in the last 168 hours. BNP (last 3 results) No results for input(s): PROBNP in the last 8760 hours. HbA1C: Recent Labs    09/04/19 0429  HGBA1C 6.3*   CBG: Recent Labs  Lab 09/03/19 2331 09/04/19 0611 09/04/19 1213  GLUCAP 94 123* 99   Lipid Profile: No results for input(s): CHOL, HDL, LDLCALC, TRIG, CHOLHDL, LDLDIRECT in the last 72 hours. Thyroid Function Tests: No results for input(s): TSH, T4TOTAL, FREET4, T3FREE, THYROIDAB in the last 72 hours. Anemia Panel: Recent Labs    09/02/19 2205  VITAMINB12 417   Sepsis Labs: No results for input(s): PROCALCITON, LATICACIDVEN in the last 168 hours.  Recent Results (from the past 240 hour(s))  Culture, Urine     Status: Abnormal   Collection Time: 09/02/19 10:05 PM   Specimen: Urine, Clean Catch  Result Value Ref Range Status   Specimen Description   Final    URINE, CLEAN CATCH Performed at [facility], [place] [address], [place], [place] [address]    Special Requests   Final    NONE Performed at [facility], [place] [address] [place], [place] [address]    Culture MULTIPLE SPECIES PRESENT, SUGGEST RECOLLECTION (A)  Final   Report Status 09/04/2019 FINAL  Final  SARS Coronavirus 2 by RT PCR (hospital order, performed in [facility] hospital lab) Nasopharyngeal Nasopharyngeal Swab     Status: None   Collection Time: 09/03/19 12:14 AM   Specimen: Nasopharyngeal Swab  Result Value Ref Range Status   SARS Coronavirus 2 NEGATIVE NEGATIVE Final    Comment: (NOTE) SARS-CoV-2 target nucleic acids are NOT DETECTED. The SARS-CoV-2 RNA is generally detectable in upper and lower respiratory specimens during the acute phase of infection. The lowest concentration of SARS-CoV-2 viral copies this assay can detect is 250  copies / mL. A negative result does not preclude SARS-CoV-2 infection and should not be used as the sole basis for treatment or other patient management decisions.  A negative result may occur with improper specimen collection / handling, submission of specimen other than nasopharyngeal swab, presence of viral mutation(s) within the areas targeted by this assay, and inadequate number of viral copies (<250 copies / mL). A negative result must be combined with clinical observations, patient history, and epidemiological information. Fact Sheet for Patients:   StrictlyIdeas.no Fact Sheet for Healthcare Providers: BankingDealers.co.za This test is not yet approved or cleared  by the [place] FDA and has been authorized for detection and/or diagnosis of SARS-CoV-2 by FDA under an Emergency Use Authorization (EUA).  This EUA will remain in effect (meaning this test can be used) for the duration of the COVID-19 declaration under Section 564(b)(1) of the Act, 21 U.S.C. section 360bbb-3(b)(1), unless the authorization is terminated or revoked sooner. Performed at [facility], [place] [address] [place], [place] [address]           Radiology Studies: MR Brain W and Wo Contrast  Result Date: 09/02/2019 CLINICAL DATA:  Dizziness for 3 weeks. History of breast carcinoma. EXAM: MRI HEAD WITHOUT AND WITH CONTRAST TECHNIQUE: Multiplanar, multiecho pulse sequences of the brain and surrounding structures were obtained without and with intravenous contrast. CONTRAST:  3.48mL GADAVIST GADOBUTROL 1 MMOL/ML IV SOLN COMPARISON:  None. FINDINGS: Brain: There is a mass centered within the right cerebellum that measures 5.6 x 3.5 x 3.3 cm this causes vasogenic edema throughout the right cerebellar hemisphere with mass effect on the fourth ventricle and cerebral aqua duct. There is resultant moderate obstructive hydrocephalus of the lateral and third ventricles. There is periventricular hyperintense T2-weighted signal consistent with transependymal interstitial edema. There are no other contrast-enhancing lesions identified. Vascular: Normal flow voids. Skull and upper cervical spine: Normal marrow signal. Sinuses/Orbits: Negative. Other: None. IMPRESSION: 1. Mass centered within the right cerebellum measuring 5.6 x 3.5 x 3.3 cm with vasogenic edema throughout the right cerebellar hemisphere and mass effect on the fourth ventricle and cerebral aqueduct. 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 2. Moderate obstructive hydrocephalus of the lateral and third ventricles with transependymal interstitial edema. 3. Critical Value/emergent results were called by telephone at the time of interpretation on 09/02/2019 at 11:02 pm to provider [facility] , who verbally acknowledged these results. Electronically Signed   By: Ulyses Jarred M.D.   On: 09/02/2019 23:03        Scheduled Meds: . Chlorhexidine Gluconate Cloth  6 each Topical Daily  . dexamethasone (DECADRON) injection  4 mg Intravenous Q6H  . feeding supplement (ENSURE ENLIVE)  237 mL Oral QID  . insulin aspart  0-6 Units Subcutaneous TID WC  . multivitamin  with minerals  1 tablet Oral Daily  . pantoprazole (PROTONIX) IV  40 mg Intravenous Q12H   Continuous Infusions: .  ceFAZolin (ANCEF) IV    . cefTRIAXone (ROCEPHIN)  IV 1 g (09/04/19 0032)     LOS: 1 day    Time spent: 25 minutes    Barb Merino, MD [facility] Pager [phone]

## 2019-09-04 NOTE — Anesthesia Procedure Notes (Signed)
Procedure Name: Intubation Date/Time: 09/04/2019 3:34 PM Performed by: Trinna Post., CRNA Pre-anesthesia Checklist: Patient identified, Emergency Drugs available, Suction available, Patient being monitored and Timeout performed Patient Re-evaluated:Patient Re-evaluated prior to induction Oxygen Delivery Method: Circle system utilized Preoxygenation: Pre-oxygenation with 100% oxygen Induction Type: IV induction Ventilation: Mask ventilation without difficulty Laryngoscope Size: Mac and 3 Grade View: Grade I Tube type: Oral Tube size: 7.5 mm Number of attempts: 1 Airway Equipment and Method: Stylet Placement Confirmation: ETT inserted through vocal cords under direct vision,  positive ETCO2 and breath sounds checked- equal and bilateral Secured at: 22 cm Tube secured with: Tape Dental Injury: Teeth and Oropharynx as per pre-operative assessment

## 2019-09-05 ENCOUNTER — Inpatient Hospital Stay (HOSPITAL_COMMUNITY): Payer: Medicaid Other

## 2019-09-05 ENCOUNTER — Encounter: Payer: Self-pay | Admitting: *Deleted

## 2019-09-05 LAB — CBC
HCT: 31.9 % — ABNORMAL LOW (ref 36.0–46.0)
Hemoglobin: 10.4 g/dL — ABNORMAL LOW (ref 12.0–15.0)
MCH: 32.5 pg (ref 26.0–34.0)
MCHC: 32.6 g/dL (ref 30.0–36.0)
MCV: 99.7 fL (ref 80.0–100.0)
Platelets: 199 10*3/uL (ref 150–400)
RBC: 3.2 MIL/uL — ABNORMAL LOW (ref 3.87–5.11)
RDW: 11.5 % (ref 11.5–15.5)
WBC: 9.6 10*3/uL (ref 4.0–10.5)
nRBC: 0 % (ref 0.0–0.2)

## 2019-09-05 LAB — BASIC METABOLIC PANEL
Anion gap: 12 (ref 5–15)
BUN: 12 mg/dL (ref 6–20)
CO2: 25 mmol/L (ref 22–32)
Calcium: 9 mg/dL (ref 8.9–10.3)
Chloride: 97 mmol/L — ABNORMAL LOW (ref 98–111)
Creatinine, Ser: 0.64 mg/dL (ref 0.44–1.00)
GFR calc Af Amer: >60 mL/min (ref >60)
GFR calc non Af Amer: >60 mL/min (ref >60)
Glucose, Bld: 120 mg/dL — ABNORMAL HIGH (ref 70–99)
Potassium: 4.2 mmol/L (ref 3.5–5.1)
Sodium: 134 mmol/L — ABNORMAL LOW (ref 135–145)

## 2019-09-05 LAB — GLUCOSE, CAPILLARY
Glucose-Capillary: 105 mg/dL — ABNORMAL HIGH (ref 70–99)
Glucose-Capillary: 148 mg/dL — ABNORMAL HIGH (ref 70–99)
Glucose-Capillary: 89 mg/dL (ref 70–99)
Glucose-Capillary: 147 mg/dL — ABNORMAL HIGH (ref 70–99)

## 2019-09-05 IMAGING — MR MR HEAD WO/W CM
15 of 17 series · 40 of 48 positions shown · IV contrast (gadavist)
Comparison: Brain MRI [DATE]

CLINICAL DATA: Cerebellar mass resection

EXAM:
MRI HEAD WITHOUT AND WITH CONTRAST
TECHNIQUE: Multiplanar, multiecho pulse sequences of the brain and surrounding
structures were obtained without and with intravenous contrast.
CONTRAST:  3mL GADAVIST GADOBUTROL 1 MMOL/ML IV SOLN

[Series 5: DWI · axial · 3.0mm · 0.88mm/px · z∈[-118,+29]mm · 5 of 104 slices shown (1 of 4)]
[im 1/104]
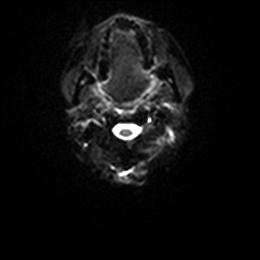
[im 26/104]
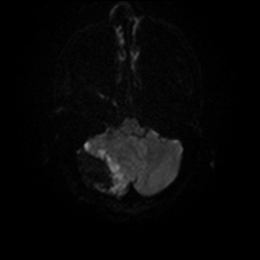
[im 52/104]
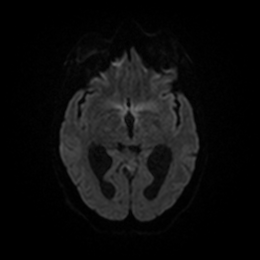
[im 78/104]
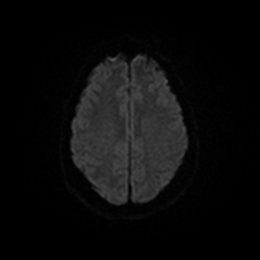
[im 104/104]
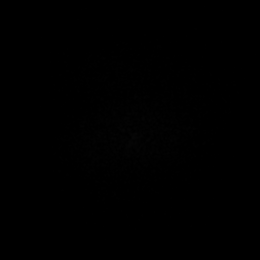

[Series 6: DWI · axial · 3.0mm · 0.88mm/px · z∈[-118,+29]mm · 3 of 52 slices shown (2 of 4)]
[im 1/52]
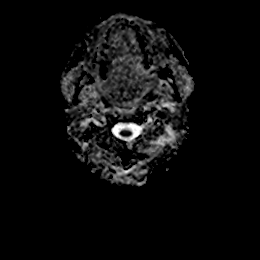
[im 26/52]
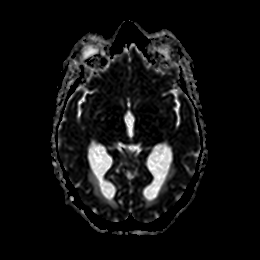
[im 52/52]
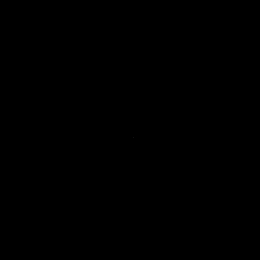

[Series 7: DWI · coronal · 4.0mm · 0.88mm/px · 4 of 66 slices shown (3 of 4)]
[im 1/66]
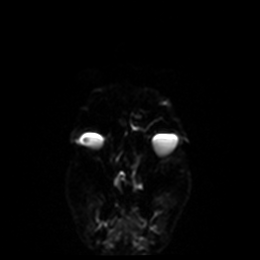
[im 22/66]
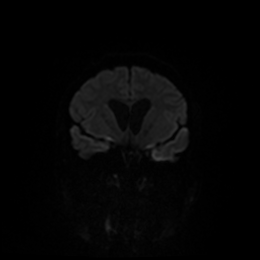
[im 44/66]
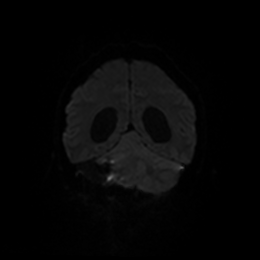
[im 66/66]
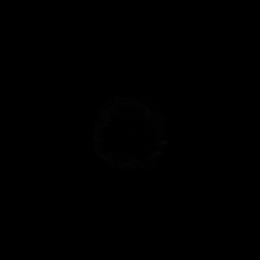

[Series 8: DWI · coronal · 4.0mm · 0.88mm/px · 2 of 33 slices shown (4 of 4)]
[im 1/33]
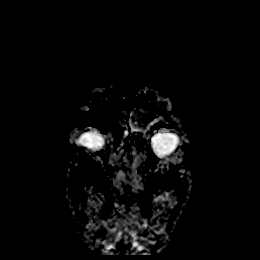
[im 33/33]
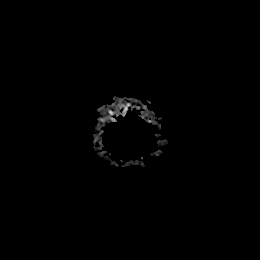

[Series 9: T1 · sagittal · 5.0mm · 0.75mm/px · 1 of 23 slices shown]
[im 1/23]
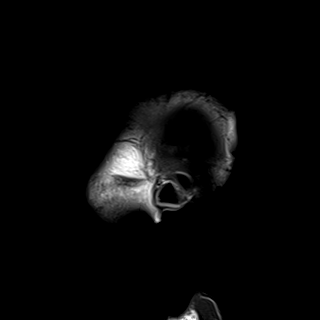

[Series 10: T2 · axial · 5.0mm · 0.72mm/px · z∈[-115,+36]mm · 2 of 27 slices shown]
[im 1/27]
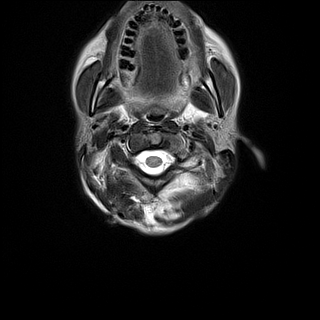
[im 27/27]
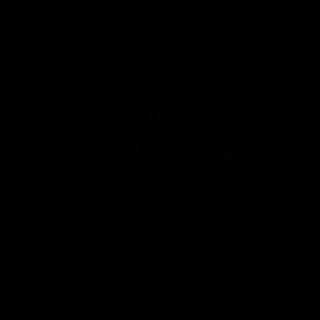

[Series 11: FLAIR · axial · 5.0mm · 0.45mm/px · z∈[-110,+40]mm · 2 of 27 slices shown (1 of 2)]
[im 1/27]
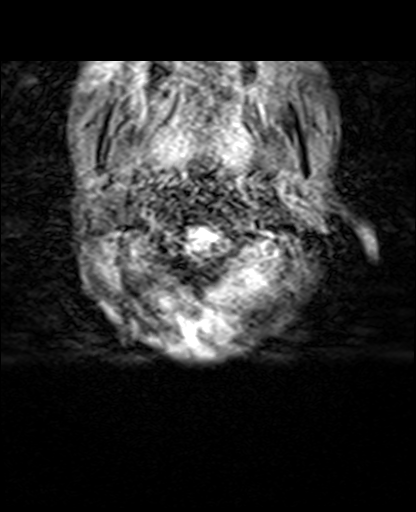
[im 27/27]
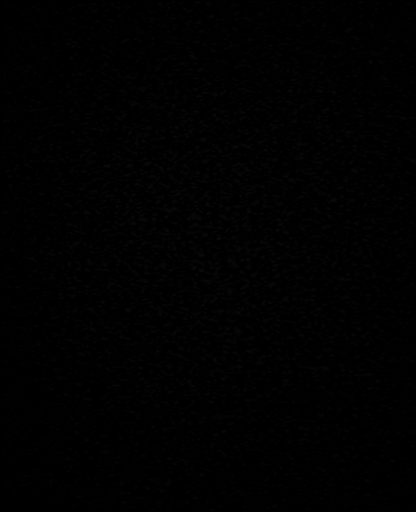

[Series 13: FLAIR · axial · 5.0mm · 0.45mm/px · z∈[-110,+40]mm · 2 of 27 slices shown (2 of 2)]
[im 1/27]
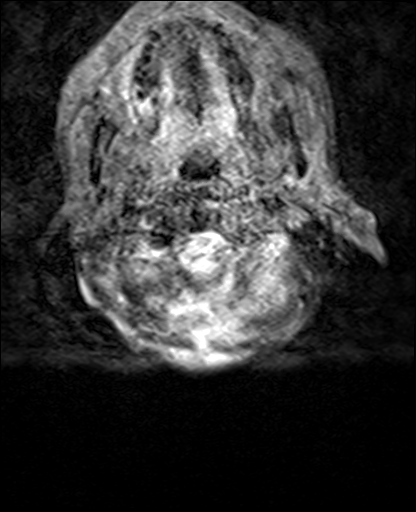
[im 27/27]
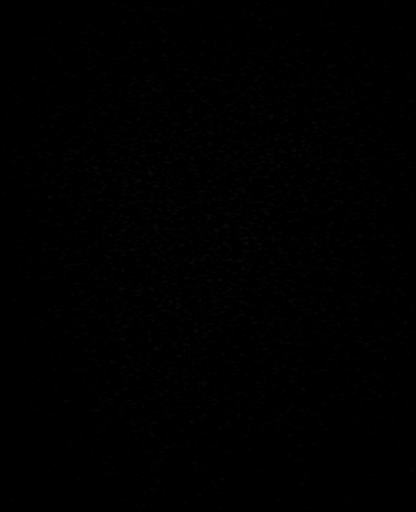

[Series 15: pha_images · axial · 3.0mm · 0.90mm/px · z∈[-116,+52]mm · 3 of 54 slices shown (1 of 2)]
[im 1/54]
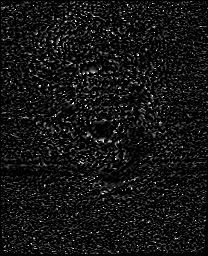
[im 27/54]
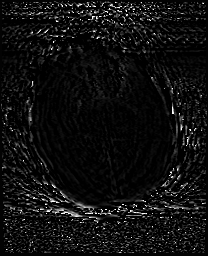
[im 54/54]
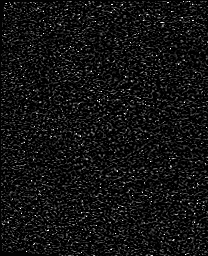

[Series 16: swi_images · axial · 3.0mm · 0.90mm/px · z∈[-119,+52]mm · 4 of 60 slices shown (1 of 2)]
[im 1/60]
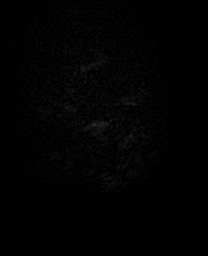
[im 20/60]
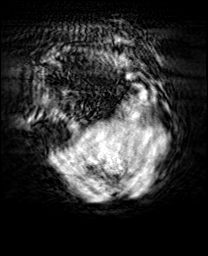
[im 40/60]
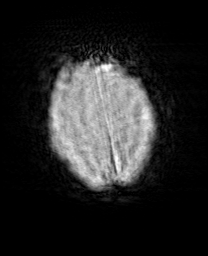
[im 60/60]
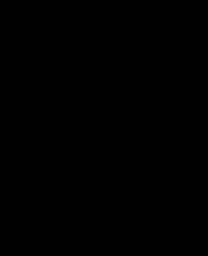

[Series 19: pha_images · axial · 3.0mm · 0.90mm/px · z∈[-119,+52]mm · 3 of 55 slices shown (2 of 2)]
[im 1/55]
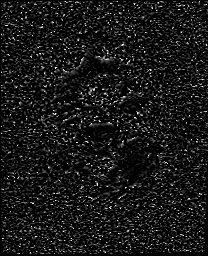
[im 28/55]
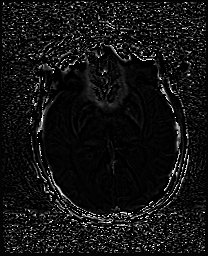
[im 55/55]
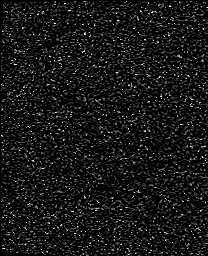

[Series 20: swi_images · axial · 3.0mm · 0.90mm/px · z∈[-119,+52]mm · 4 of 60 slices shown (2 of 2)]
[im 1/60]
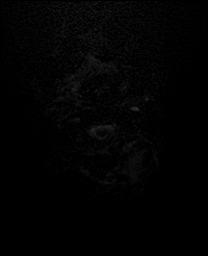
[im 20/60]
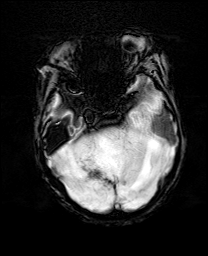
[im 40/60]
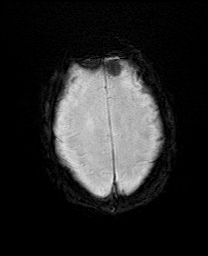
[im 60/60]
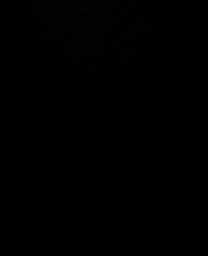

[Series 22: T2 post-contrast · coronal · 5.0mm · 0.72mm/px · 2 of 28 slices shown]
[im 1/28]
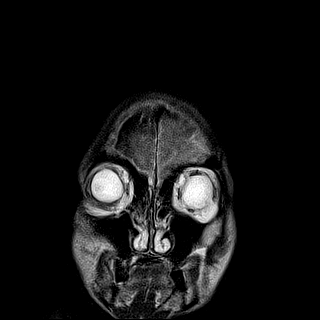
[im 28/28]
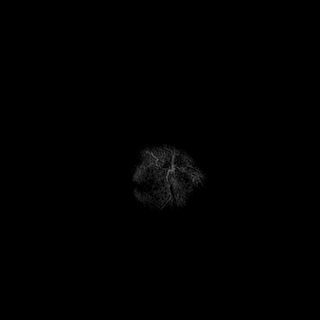

[Series 24: T1 post-contrast · coronal · 5.0mm · 0.34mm/px · 2 of 28 slices shown (1 of 2)]
[im 1/28]
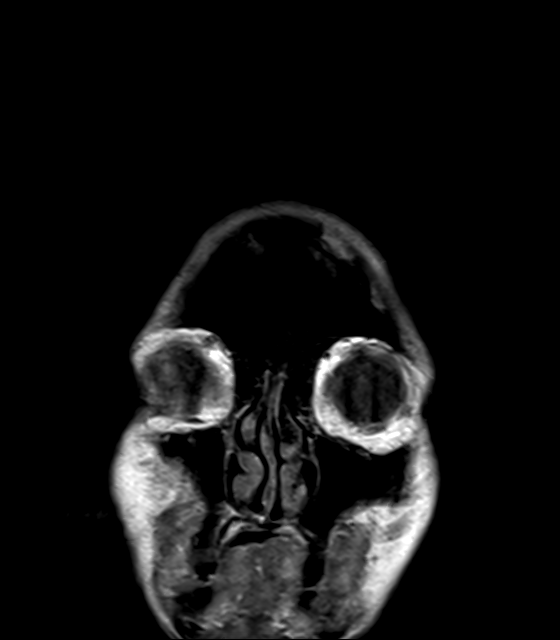
[im 28/28]
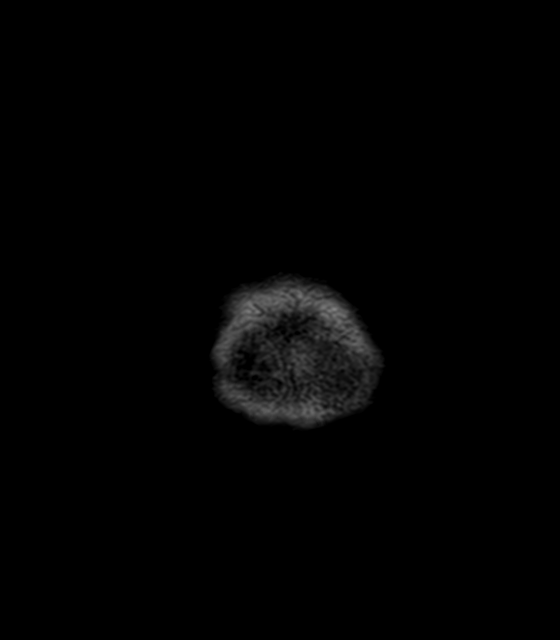

[Series 25: T1 post-contrast · sagittal · 5.0mm · 0.72mm/px · 1 of 23 slices shown (2 of 2)]
[im 1/23]
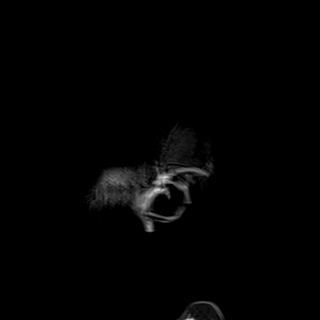

[40 of 48 positions shown; findings below may reference images not displayed]

FINDINGS: The examination is markedly degraded by motion due to patient
discomfort.

Brain: Status post resection of right cerebellar lesion. There is
mild diffusion restriction along the resection margin. No other
diffusion abnormality. Hydrocephalus is slightly improved. There is
persistent periventricular transependymal interstitial edema. There
is hyperintense T1-weighted signal within the right cerebellum along
the resection margin. Intravenous contrast material was
administered, but there is no normal or abnormal contrast
enhancement seen. No chronic microhemorrhage. Normal midline
structures. Small amount of pneumocephalus suspected along anterior
left convexity.

Vascular: Normal flow voids.

Skull and upper cervical spine: Right suboccipital craniectomy.

Sinuses/Orbits: Negative.

Other: None.
IMPRESSION: 1. Markedly motion degraded examination.
2. Status post resection of right cerebellar lesion. Though IV
contrast agent was administered, no normal or abnormal enhancement
is seen anywhere in the head, suggesting delayed transit or
extravasation. Potential extravasation was discussed with the
patient's nurse by the technologist immediately following the scan.
3. Slight improvement of hydrocephalus with persistent
periventricular transependymal interstitial edema.

## 2019-09-05 MED ORDER — GADOBUTROL 1 MMOL/ML IV SOLN
3.0000 mL | Freq: Once | INTRAVENOUS | Status: AC | PRN
  Administered 2019-09-05: 3 mL via INTRAVENOUS

## 2019-09-05 MED FILL — Thrombin For Soln 5000 Unit: CUTANEOUS | Qty: 5000 | Status: AC

## 2019-09-05 NOTE — Evaluation (Signed)
Physical Therapy Evaluation Patient Details Name: Diane Rosario MRN: [id number] DOB: Apr 14, 1975 Today's Date: 09/05/2019   History of Present Illness  44 year old female with history of left breast cancer status post mastectomy and chemoradiation, chemotherapy-induced neuropathy, PTSD and other comorbidities.  She presented with headaches and unsteady gait x several weeks and MRI positive for R cerebellar mass w/ mass effect on fourth vetricle and mod obstructive hydrocephalus.  She is now s/p R suboccipital craniotomy on 5/26 for resection of tumor.  Clinical Impression  Patient presents with decreased mobility due to decreased balance, decreased activity tolerance, decreased strength and decreased cognition.  She was evidently independent at home and has intermittent assist from a "friend".  Unsure if she would be safe at home given balance and cognitive issues currently unless has 24 hour assist.  May need SNF initially.  PT to followl     Follow Up Recommendations SNF;Supervision/Assistance - 24 hour    Equipment Recommendations  Rolling walker with 5" wheels    Recommendations for Other Services       Precautions / Restrictions Precautions Precautions: Fall      Mobility  Bed Mobility Overal bed mobility: Needs Assistance Bed Mobility: Supine to Sit     Supine to sit: Min assist     General bed mobility comments: slow moving, assisted to lift trunk  Transfers Overall transfer level: Needs assistance Equipment used: None Transfers: Sit to/from Stand Sit to Stand: Min assist;Mod assist         General transfer comment: assist for balance  Ambulation/Gait Ambulation/Gait assistance: Min assist;Mod assist Gait Distance (Feet): 150 Feet Assistive device: 1 person hand held assist Gait Pattern/deviations: Step-through pattern;Decreased stride length;Shuffle;Drifts right/left     General Gait Details: initially mod A around her waist for balance, to bathroom, then  to hallway then changed to hand hold assist and slight improvement with less support needed, but still off balance  Stairs            Wheelchair Mobility    Modified Rankin (Stroke Patients Only)       Balance Overall balance assessment: Needs assistance   Sitting balance-Leahy Scale: Fair       Standing balance-Leahy Scale: Poor Standing balance comment: min A for washing hands at sink                             Pertinent Vitals/Pain Pain Assessment: Faces Faces Pain Scale: Hurts little more Pain Location: head Pain Descriptors / Indicators: Discomfort Pain Intervention(s): Monitored during session;Repositioned    Home Living Family/patient expects to be discharged to:: Private residence Living Arrangements: Non-relatives/Friends Available Help at Discharge: Friend(s);Available PRN/intermittently Type of Home: House Home Access: Level entry     Home Layout: One level Home Equipment: None Additional Comments: patient with inconsistent responses today reporting she has steps in her home, then stating she does not; poor historian    Prior Function Level of Independence: Independent         Comments: denies imbalance PTA, but in chart states she was having gait instability     Hand Dominance        Extremity/Trunk Assessment   Upper Extremity Assessment Upper Extremity Assessment: Defer to OT evaluation    Lower Extremity Assessment Lower Extremity Assessment: Generalized weakness    Cervical / Trunk Assessment Cervical / Trunk Assessment: Other exceptions Cervical / Trunk Exceptions: cachectic  Communication   Communication: No difficulties  Cognition Arousal/Alertness:  Awake/alert Behavior During Therapy: Flat affect Overall Cognitive Status: Impaired/Different from baseline Area of Impairment: Attention;Memory;Following commands;Problem solving                   Current Attention Level: Sustained Memory: Decreased  short-term memory Following Commands: Follows one step commands with increased time;Follows one step commands consistently     Problem Solving: Slow processing        General Comments General comments (skin integrity, edema, etc.): needed assist for hygiene after toileting    Exercises     Assessment/Plan    PT Assessment Patient needs continued PT services  PT Problem List Decreased strength;Decreased balance;Decreased knowledge of use of DME;Decreased safety awareness;Decreased cognition;Decreased mobility       PT Treatment Interventions DME instruction;Therapeutic activities;Balance training;Patient/family education;Therapeutic exercise;Functional mobility training;Gait training;Stair training    PT Goals (Current goals can be found in the Care Plan section)  Acute Rehab PT Goals Patient Stated Goal: none stated PT Goal Formulation: Patient unable to participate in goal setting Time For Goal Achievement: 09/19/19 Potential to Achieve Goals: Fair    Frequency Min 3X/week   Barriers to discharge        Co-evaluation               AM-PAC PT "6 Clicks" Mobility  Outcome Measure Help needed turning from your back to your side while in a flat bed without using bedrails?: A Little Help needed moving from lying on your back to sitting on the side of a flat bed without using bedrails?: A Little Help needed moving to and from a bed to a chair (including a wheelchair)?: A Little Help needed standing up from a chair using your arms (e.g., wheelchair or bedside chair)?: A Little Help needed to walk in hospital room?: A Lot Help needed climbing 3-5 steps with a railing? : A Lot 6 Click Score: 16    End of Session   Activity Tolerance: Patient limited by fatigue Patient left: in chair;with call bell/phone within reach;with chair alarm set Nurse Communication: Mobility status PT Visit Diagnosis: Other abnormalities of gait and mobility (R26.89);Muscle weakness  (generalized) (M62.81)    Time: [id number] PT Time Calculation (min) (ACUTE ONLY): 20 min   Charges:   PT Evaluation $PT Eval Moderate Complexity: [place], [place] Acute Rehabilitation Services [phone] 09/05/2019   Reginia Naas 09/05/2019, 5:20 PM

## 2019-09-05 NOTE — Progress Notes (Signed)
PROGRESS NOTE    Diane Rosario  [id number] DOB: [birth year] DOA: 09/02/2019 PCP: Department, [facility]    Brief Narrative:  44 year old female with history of left breast cancer status postmastectomy and chemoradiation, chemotherapy-induced neuropathy, PTSD and other comorbidities.  She was thought to be cancer free for last few years.  Presented to the emergency department with unsteady gait and headache.  She had complained of throbbing and sharp bilateral temporal headaches for at least 4 weeks.  In the emergency room, hemodynamically stable, MRI of the brain was notable for a mass within the right cerebellum causing vasogenic edema and mass-effect on the fourth ventricle, moderate obstructive hydrocephalus.   Assessment & Plan:   Active Problems:   Malignant neoplasm of upper-outer quadrant of left breast in female, estrogen receptor positive ([place])   Neoplasm of brain causing mass effect on adjacent structures ([place])   Acute lower UTI   Failure to thrive in adult  Brain tumor with vasogenic edema/mass-effect and obstructive hydrocephalus: Remains on IV steroids. followed by oncology and neuro-oncology. 5/26, cerebellar tumor removal. Postop stable. Managed in neuro ICU. Postop surgical management as per surgery.  Suspected UTI present on admission: Urine culture with multiple species.  With no specific growth, Rocephin for 3 days. Finished therapy.  History of breast cancer status post left mastectomy: Now with metastatic breast cancer.  Followed by her oncologist. Plan is to discharge patient and proceed with PET scan next week.  Anxiety and depression, PTSD and bipolar disorder: Patient on alprazolam that is continued.  She takes risperidone injections 50 mg every 2 weeks.   DVT prophylaxis: SCDs Code Status: Full code Family Communication: None.  Patient stated she is talking to her mother. Disposition Plan: Status is: Inpatient  Remains inpatient  appropriate because:Inpatient level of care appropriate due to severity of illness   Dispo: The patient is from: Home              Anticipated d/c is to: Unknown.              Anticipated d/c date is: > 3 days              Patient currently is not medically stable to d/c. She is immediate postop.          Consultants:   Neurosurgery  Oncology  Procedures:   Craniotomy, tumor removal on 5/26.  Antimicrobials:  Anti-infectives (From admission, onward)   Start     Dose/Rate Route Frequency Ordered Stop   09/04/19 2000  ceFAZolin (ANCEF) IVPB 1 g/50 mL premix  Status:  Discontinued     1 g 100 mL/hr over 30 Minutes Intravenous Every 8 hours 09/04/19 1945 09/04/19 1948   09/04/19 1710  bacitracin 50,000 Units in sodium chloride 0.9 % 500 mL irrigation  Status:  Discontinued       As needed 09/04/19 1711 09/04/19 1825   09/04/19 0749  ceFAZolin (ANCEF) IVPB 2g/100 mL premix     2 g 200 mL/hr over 30 Minutes Intravenous 30 min pre-op 09/04/19 0749 09/04/19 1606   09/03/19 0100  cefTRIAXone (ROCEPHIN) 1 g in sodium chloride 0.9 % 100 mL IVPB     1 g 200 mL/hr over 30 Minutes Intravenous Every 24 hours 09/03/19 0056           Subjective: Patient seen and examined. Denied any overnight events. Early morning rounds, she was complaining of being hungry and thirsty. Denies any nausea vomiting or headache.  Objective: Vitals:  09/05/19 0800 09/05/19 1200 09/05/19 1300 09/05/19 1400  BP: 127/76  128/79 115/72  Pulse: 71  70 (!) 52  Resp: 12  11 10   Temp: 98.5 F (36.9 C) 99.1 F (37.3 C)    TempSrc: Oral Oral    SpO2: 100%  100% 98%  Weight:      Height:        Intake/Output Summary (Last 24 hours) at 09/05/2019 1438 Last data filed at 09/05/2019 1400 Gross per 24 hour  Intake 2493.97 ml  Output 660 ml  Net 1833.97 ml   Filed Weights   09/02/19 2057  Weight: 33.9 kg    Examination:  General exam: Appears calm and comfortable , chronically sick looking.   Flat affect. Postop surgical dressing on her posterior neck intact and dry. Not removed by me. Respiratory system: Clear to auscultation. Respiratory effort normal. Cardiovascular system: S1 & S2 heard, RRR. No JVD, murmurs, rubs, gallops or clicks. Gastrointestinal system: Abdomen is nondistended, soft and nontender.  Central nervous system: Alert and oriented. No focal neurological deficits but with flat affect.    Data Reviewed: I have personally reviewed following labs and imaging studies  CBC: Recent Labs  Lab 09/02/19 2205 09/03/19 0527 09/05/19 0450  WBC 7.8 5.7 9.6  NEUTROABS 4.7  --   --   HGB 14.7 13.4 10.4*  HCT 45.2 40.3 31.9*  MCV 102.3* 99.5 99.7  PLT 239 230 [id number]   Basic Metabolic Panel: Recent Labs  Lab 09/02/19 2205 09/03/19 0527 09/05/19 0450  NA 139 141 134*  K 4.2 4.0 4.2  CL 101 101 97*  CO2 26 31 25   GLUCOSE 104* 143* 120*  BUN 23* 22* 12  CREATININE 0.88 0.83 0.64  CALCIUM 9.4 9.5 9.0   GFR: Estimated Creatinine Clearance: 48 mL/min (by C-G formula based on SCr of 0.64 mg/dL). Liver Function Tests: Recent Labs  Lab 09/02/19 2205  AST 34  ALT 18  ALKPHOS 56  BILITOT 0.4  PROT 7.8  ALBUMIN 4.4   No results for input(s): LIPASE, AMYLASE in the last 168 hours. No results for input(s): AMMONIA in the last 168 hours. Coagulation Profile: No results for input(s): INR, PROTIME in the last 168 hours. Cardiac Enzymes: No results for input(s): CKTOTAL, CKMB, CKMBINDEX, TROPONINI in the last 168 hours. BNP (last 3 results) No results for input(s): PROBNP in the last 8760 hours. HbA1C: Recent Labs    09/04/19 0429  HGBA1C 6.3*   CBG: Recent Labs  Lab 09/03/19 2331 09/04/19 0611 09/04/19 1213 09/05/19 0848 09/05/19 1208  GLUCAP 94 123* 99 105* 148*   Lipid Profile: No results for input(s): CHOL, HDL, LDLCALC, TRIG, CHOLHDL, LDLDIRECT in the last 72 hours. Thyroid Function Tests: No results for input(s): TSH, T4TOTAL, FREET4,  T3FREE, THYROIDAB in the last 72 hours. Anemia Panel: Recent Labs    09/02/19 2205  VITAMINB12 417   Sepsis Labs: No results for input(s): PROCALCITON, LATICACIDVEN in the last 168 hours.  Recent Results (from the past 240 hour(s))  Culture, Urine     Status: Abnormal   Collection Time: 09/02/19 10:05 PM   Specimen: Urine, Clean Catch  Result Value Ref Range Status   Specimen Description   Final    URINE, CLEAN CATCH Performed at [facility], [place] [address] [place], [place] [address]    Special Requests   Final    NONE Performed at [facility], [place] [address] [place], [place] [address]    Culture  MULTIPLE SPECIES PRESENT, SUGGEST RECOLLECTION (A)  Final   Report Status 09/04/2019 FINAL  Final  SARS Coronavirus 2 by RT PCR (hospital order, performed in [facility] hospital lab) Nasopharyngeal Nasopharyngeal Swab     Status: None   Collection Time: 09/03/19 12:14 AM   Specimen: Nasopharyngeal Swab  Result Value Ref Range Status   SARS Coronavirus 2 NEGATIVE NEGATIVE Final    Comment: (NOTE) SARS-CoV-2 target nucleic acids are NOT DETECTED. The SARS-CoV-2 RNA is generally detectable in upper and lower respiratory specimens during the acute phase of infection. The lowest concentration of SARS-CoV-2 viral copies this assay can detect is 250 copies / mL. A negative result does not preclude SARS-CoV-2 infection and should not be used as the sole basis for treatment or other patient management decisions.  A negative result may occur with improper specimen collection / handling, submission of specimen other than nasopharyngeal swab, presence of viral mutation(s) within the areas targeted by this assay, and inadequate number of viral copies (<250 copies / mL). A negative result must be combined with clinical observations, patient history, and epidemiological information. Fact Sheet for Patients:    StrictlyIdeas.no Fact Sheet for Healthcare Providers: BankingDealers.co.za This test is not yet approved or cleared  by the [place] FDA and has been authorized for detection and/or diagnosis of SARS-CoV-2 by FDA under an Emergency Use Authorization (EUA).  This EUA will remain in effect (meaning this test can be used) for the duration of the COVID-19 declaration under Section 564(b)(1) of the Act, 21 U.S.C. section 360bbb-3(b)(1), unless the authorization is terminated or revoked sooner. Performed at [facility], [place] [address], [place], [place] [address]   MRSA PCR Screening     Status: None   Collection Time: 09/04/19  7:47 PM   Specimen: Nasal Mucosa; Nasopharyngeal  Result Value Ref Range Status   MRSA by PCR NEGATIVE NEGATIVE Final    Comment:        The GeneXpert MRSA Assay (FDA approved for NASAL specimens only), is one component of a comprehensive MRSA colonization surveillance program. It is not intended to diagnose MRSA infection nor to guide or monitor treatment for MRSA infections. Performed at [facility], [place] [address], [place], [place] [address]          Radiology Studies: MR BRAIN W WO CONTRAST  Result Date: 09/05/2019 CLINICAL DATA:  Cerebellar mass resection EXAM: MRI HEAD WITHOUT AND WITH CONTRAST TECHNIQUE: Multiplanar, multiecho pulse sequences of the brain and surrounding structures were obtained without and with intravenous contrast. CONTRAST:  69mL GADAVIST GADOBUTROL 1 MMOL/ML IV SOLN COMPARISON:  Brain MRI 09/02/2019 FINDINGS: The examination is markedly degraded by motion due to patient discomfort. Brain: Status post resection of right cerebellar lesion. There is mild diffusion restriction along the resection margin. No other diffusion abnormality. Hydrocephalus is slightly improved. There is persistent periventricular transependymal interstitial edema. There is  hyperintense T1-weighted signal within the right cerebellum along the resection margin. Intravenous contrast material was administered, but there is no normal or abnormal contrast enhancement seen. No chronic microhemorrhage. Normal midline structures. Small amount of pneumocephalus suspected along anterior left convexity. Vascular: Normal flow voids. Skull and upper cervical spine: Right suboccipital craniectomy. Sinuses/Orbits: Negative. Other: None. IMPRESSION: 1. Markedly motion degraded examination. 2. Status post resection of right cerebellar lesion. Though IV contrast agent was administered, no normal or abnormal enhancement is seen anywhere in the head, suggesting delayed transit or extravasation. Potential extravasation was discussed with the patient's nurse  by the technologist immediately following the scan. 3. Slight improvement of hydrocephalus with persistent periventricular transependymal interstitial edema. Electronically Signed   By: Ulyses Jarred M.D.   On: 09/05/2019 02:16   DG CHEST PORT 1 VIEW  Result Date: 09/04/2019 CLINICAL DATA:  Central line placement EXAM: PORTABLE CHEST 1 VIEW COMPARISON:  08/19/2019, CT 11/16/2016 FINDINGS: Hyperinflation. Right-sided central venous catheter with tip projecting over the SVC. More radiopaque portion of tubing at the tip and extending 5 cm proximal to the tip. No pneumothorax. Clear lung fields. Normal heart size. Clips in the left axilla. Post mastectomy changes on the left IMPRESSION: Right-sided central venous catheter tip overlies the distal SVC. No pneumothorax. Electronically Signed   By: Donavan Foil M.D.   On: 09/04/2019 19:13        Scheduled Meds: . Chlorhexidine Gluconate Cloth  6 each Topical Daily  . dexamethasone (DECADRON) injection  4 mg Intravenous Q6H  . feeding supplement (ENSURE ENLIVE)  237 mL Oral QID  . insulin aspart  0-6 Units Subcutaneous TID WC  . multivitamin with minerals  1 tablet Oral Daily  . pantoprazole  (PROTONIX) IV  40 mg Intravenous Q12H   Continuous Infusions: . sodium chloride 75 mL/hr at 09/05/19 1400  . cefTRIAXone (ROCEPHIN)  IV Stopped (09/05/19 0044)     LOS: 2 days    Time spent: 25 minutes    Barb Merino, MD [facility] Pager [phone]

## 2019-09-05 NOTE — Progress Notes (Signed)
Providing Compassionate, Quality Care - Together   Subjective: Patient reports no issues overnight. She denies dizziness. She denies pain. She states she is hungry this morning.  Objective: Vital signs in last 24 hours: Temp:  [97 F (36.1 C)-98.5 F (36.9 C)] 98.5 F (36.9 C) (05/27 0800) Pulse Rate:  [51-88] 71 (05/27 0800) Resp:  [10-25] 12 (05/27 0800) BP: (110-141)/(65-89) 127/76 (05/27 0800) SpO2:  [95 %-100 %] 100 % (05/27 0800) Arterial Line BP: (110-194)/(59-189) 120/59 (05/27 0800)  Intake/Output from previous day: 05/26 0701 - 05/27 0700 In: 2092.9 [I.V.:1642.9; IV Piggyback:450] Out: 960 [Urine:810; Blood:150] Intake/Output this shift: Total I/O In: 75 [I.V.:75] Out: -   Alert and oriented x 4 PERRLA CN II-XII grossly intact MAE, Strength and sensation intact; Generalized weakness; Patient appears at her baseline Ataxia present in all extremities Dressing is dry and intact; Old sanguinous drainage marked; Does not appear to be actively draining   Lab Results: Recent Labs    09/03/19 0527 09/05/19 0450  WBC 5.7 9.6  HGB 13.4 10.4*  HCT 40.3 31.9*  PLT 230 199   BMET Recent Labs    09/03/19 0527 09/05/19 0450  NA 141 134*  K 4.0 4.2  CL 101 97*  CO2 31 25  GLUCOSE 143* 120*  BUN 22* 12  CREATININE 0.83 0.64  CALCIUM 9.5 9.0    Studies/Results: MR BRAIN W WO CONTRAST  Result Date: 09/05/2019 CLINICAL DATA:  Cerebellar mass resection EXAM: MRI HEAD WITHOUT AND WITH CONTRAST TECHNIQUE: Multiplanar, multiecho pulse sequences of the brain and surrounding structures were obtained without and with intravenous contrast. CONTRAST:  88mL GADAVIST GADOBUTROL 1 MMOL/ML IV SOLN COMPARISON:  Brain MRI 09/02/2019 FINDINGS: The examination is markedly degraded by motion due to patient discomfort. Brain: Status post resection of right cerebellar lesion. There is mild diffusion restriction along the resection margin. No other diffusion abnormality.  Hydrocephalus is slightly improved. There is persistent periventricular transependymal interstitial edema. There is hyperintense T1-weighted signal within the right cerebellum along the resection margin. Intravenous contrast material was administered, but there is no normal or abnormal contrast enhancement seen. No chronic microhemorrhage. Normal midline structures. Small amount of pneumocephalus suspected along anterior left convexity. Vascular: Normal flow voids. Skull and upper cervical spine: Right suboccipital craniectomy. Sinuses/Orbits: Negative. Other: None. IMPRESSION: 1. Markedly motion degraded examination. 2. Status post resection of right cerebellar lesion. Though IV contrast agent was administered, no normal or abnormal enhancement is seen anywhere in the head, suggesting delayed transit or extravasation. Potential extravasation was discussed with the patient's nurse by the technologist immediately following the scan. 3. Slight improvement of hydrocephalus with persistent periventricular transependymal interstitial edema. Electronically Signed   By: Ulyses Jarred M.D.   On: 09/05/2019 02:16   DG CHEST PORT 1 VIEW  Result Date: 09/04/2019 CLINICAL DATA:  Central line placement EXAM: PORTABLE CHEST 1 VIEW COMPARISON:  08/19/2019, CT 11/16/2016 FINDINGS: Hyperinflation. Right-sided central venous catheter with tip projecting over the SVC. More radiopaque portion of tubing at the tip and extending 5 cm proximal to the tip. No pneumothorax. Clear lung fields. Normal heart size. Clips in the left axilla. Post mastectomy changes on the left IMPRESSION: Right-sided central venous catheter tip overlies the distal SVC. No pneumothorax. Electronically Signed   By: Donavan Foil M.D.   On: 09/04/2019 19:13    Assessment/Plan: Patient is one day status post right suboccipital craniotomy for cerebellar tumor resection. She is doing well postoperatively.   LOS: 2 days    -  Oncology is on board to get  patient PET scan following discharge -PT and OT ordered -Regular diet ordered  Diane Gilmore, DNP, AGNP-C Nurse Practitioner  [facility] [place]. [address], [address], [place], [place] [address] P: [phone]    F: [phone]  09/05/2019, 9:41 AM

## 2019-09-06 ENCOUNTER — Other Ambulatory Visit: Payer: Self-pay | Admitting: Radiation Therapy

## 2019-09-06 LAB — GLUCOSE, CAPILLARY
Glucose-Capillary: 165 mg/dL — ABNORMAL HIGH (ref 70–99)
Glucose-Capillary: 139 mg/dL — ABNORMAL HIGH (ref 70–99)
Glucose-Capillary: 138 mg/dL — ABNORMAL HIGH (ref 70–99)
Glucose-Capillary: 110 mg/dL — ABNORMAL HIGH (ref 70–99)

## 2019-09-06 MED ORDER — DEXAMETHASONE 4 MG PO TABS
2.0000 mg | ORAL_TABLET | Freq: Two times a day (BID) | ORAL | Status: DC
  Administered 2019-09-06 – 2019-09-07 (×2): 2 mg via ORAL
  Filled 2019-09-06 (×2): qty 1

## 2019-09-06 MED ORDER — SCOPOLAMINE 1 MG/3DAYS TD PT72: 1.0000 | MEDICATED_PATCH | TRANSDERMAL | Status: DC

## 2019-09-06 MED ORDER — OXYCODONE-ACETAMINOPHEN 5-325 MG PO TABS
1.0000 | ORAL_TABLET | ORAL | Status: DC | PRN
  Administered 2019-09-06 – 2019-09-07 (×4): 1 via ORAL
  Filled 2019-09-06 (×4): qty 1

## 2019-09-06 MED ORDER — ACETAMINOPHEN 500 MG PO TABS: 1000.0000 mg | ORAL_TABLET | Freq: Once | ORAL | Status: DC

## 2019-09-06 NOTE — Evaluation (Addendum)
Occupational Therapy Evaluation Patient Details Name: Diane Rosario MRN: [id number] DOB: July 08, 1975 Today's Date: 09/06/2019    History of Present Illness 44 year old female with history of left breast cancer status post mastectomy and chemoradiation, chemotherapy-induced neuropathy, PTSD and other comorbidities.  She presented with headaches and unsteady gait x several weeks and MRI positive for R cerebellar mass w/ mass effect on fourth vetricle and mod obstructive hydrocephalus.  She is now s/p R suboccipital craniotomy on 5/26 for resection of tumor.   Clinical Impression   Patient is s/p R suboccipital craniotomy surgery resulting in functional limitations due to the deficits listed below (see OT problem list). Pt currently very impulsive with movement and high fall risk. Pt declines use of RW. Pt needs redirection for safety. Recommend 24/7 (A) for d/c for safety  Patient will benefit from skilled OT acutely to increase independence and safety with ADLS to allow discharge [place].     Follow Up Recommendations  Home health OT;Supervision/Assistance - 24 hour(if no one can give supervision then needs SNF)    Equipment Recommendations  3 in 1 bedside commode    Recommendations for Other Services  Palliative consult ?     Precautions / Restrictions Precautions Precautions: Fall Restrictions Weight Bearing Restrictions: No      Mobility Bed Mobility Overal bed mobility: Needs Assistance Bed Mobility: Supine to Sit;Sit to Supine     Supine to sit: Supervision Sit to supine: Supervision   General bed mobility comments: assist for safety with lines  Transfers Overall transfer level: Needs assistance Equipment used: Rolling walker (2 wheeled) Transfers: Sit to/from Stand Sit to Stand: Min assist         General transfer comment: pt pushing RW away and walking around RW despite cues    Balance Overall balance assessment: Needs assistance   Sitting balance-Leahy  Scale: Good Sitting balance - Comments: sitting straight up in bed and adjusting herself easily     Standing balance-Leahy Scale: Poor Standing balance comment: pt requires (A) for stability               High Level Balance Comments: worked on LE strength and balance in standing including sit <> stand x 5, standing marching and heel raises with UE support, no UE assist reaching to tap targets with alternating hands, reaching to pick up item from floor with min A, standing side stepping with UE support.           ADL either performed or assessed with clinical judgement   ADL Overall ADL's : Needs assistance/impaired                         Toilet Transfer: Moderate assistance   Toileting- Clothing Manipulation and Hygiene: Moderate assistance       Functional mobility during ADLs: Minimal assistance General ADL Comments: pt attempting to exit bed on the right with all cords on L. pt needed cues to get back on bed and exit on L. pt followed command. pt needed cues to decreases speed to decreas fall risk. pt pushing RW away and reaching for falls. pt static standing in squat to void bladder instead of sitting. pt returning to supine.      Vision   Vision Assessment?: Yes Eye Alignment: Within Functional Limits Ocular Range of Motion: Restricted looking up Tracking/Visual Pursuits: Impaired - to be further tested in functional context Additional Comments: pt losing therapist with upward lateral movement. pt states " you already  did this" Pt needs cues to sustain attentino to task     Perception     Praxis      Pertinent Vitals/Pain Pain Assessment: Faces Faces Pain Scale: Hurts little more Pain Location: head Pain Descriptors / Indicators: Discomfort Pain Intervention(s): Monitored during session;Repositioned;Other (comment)(Rn notified of needs)     Hand Dominance Right   Extremity/Trunk Assessment Upper Extremity Assessment Upper Extremity Assessment:  Generalized weakness       Cervical / Trunk Assessment Cervical / Trunk Assessment: Other exceptions Cervical / Trunk Exceptions: cachexia appearance   Communication Communication Communication: No difficulties   Cognition Arousal/Alertness: Awake/alert Behavior During Therapy: Flat affect Overall Cognitive Status: Impaired/Different from baseline Area of Impairment: Attention;Safety/judgement                   Current Attention Level: Sustained Memory: Decreased short-term memory;Decreased recall of precautions Following Commands: Follows one step commands with increased time;Follows one step commands consistently Safety/Judgement: Decreased awareness of safety;Decreased awareness of deficits   Problem Solving: Slow processing;Requires verbal cues General Comments: impulsive and needs cues at max to prevent fall due to urgency to void bladder   General Comments  pt declines OOB. pt motivated to exit bed by internal needs to void bladder    Exercises     Shoulder Instructions      Home Living Family/patient expects to be discharged to:: Private residence Living Arrangements: Non-relatives/Friends Available Help at Discharge: Friend(s);Available PRN/intermittently Type of Home: House Home Access: Level entry     Home Layout: One level               Home Equipment: None   Additional Comments: information obtained from PT evaluation as patient is inconsistent in information. pt just answers with pain medication.       Prior Functioning/Environment Level of Independence: Independent        Comments: denies imbalance PTA, but in chart states she was having gait instability        OT Problem List: Decreased strength;Decreased activity tolerance;Impaired balance (sitting and/or standing);Decreased cognition;Decreased safety awareness;Decreased knowledge of use of DME or AE;Decreased knowledge of precautions;Pain      OT Treatment/Interventions:  Self-care/ADL training;Therapeutic exercise;Neuromuscular education;Energy conservation;DME and/or AE instruction;Manual therapy;Therapeutic activities;Cognitive remediation/compensation;Patient/family education;Balance training    OT Goals(Current goals can be found in the care plan section) Acute Rehab OT Goals Patient Stated Goal: to get pain medication OT Goal Formulation: Patient unable to participate in goal setting Time For Goal Achievement: 09/20/19 Potential to Achieve Goals: Good  OT Frequency: Min 2X/week   Barriers to D/C: Decreased caregiver support          Co-evaluation              AM-PAC OT "6 Clicks" Daily Activity     Outcome Measure Help from another person eating meals?: A Little Help from another person taking care of personal grooming?: A Lot Help from another person toileting, which includes using toliet, bedpan, or urinal?: A Lot Help from another person bathing (including washing, rinsing, drying)?: A Lot Help from another person to put on and taking off regular upper body clothing?: A Lot Help from another person to put on and taking off regular lower body clothing?: A Lot 6 Click Score: 13   End of Session Nurse Communication: Mobility status;Precautions  Activity Tolerance: Patient limited by fatigue Patient left: in bed;with call bell/phone within reach;with bed alarm set  OT Visit Diagnosis: Unsteadiness on feet (R26.81);Muscle weakness (generalized) (M62.81)  Time: [id number] OT Time Calculation (min): 22 min Charges:  OT General Charges $OT Visit: 1 Visit OT Evaluation $OT Eval Moderate Complexity: 1 Mod   Brynn, OTR/L  Acute Rehabilitation Services Pager: [phone] Office: [phone] .   Jeri Modena 09/06/2019, 1:27 PM

## 2019-09-06 NOTE — Progress Notes (Signed)
Physical Therapy Treatment Patient Details Name: Diane Rosario MRN: [id number] DOB: [birth year] Today's Date: 09/06/2019    History of Present Illness 44 year old female with history of left breast cancer status post mastectomy and chemoradiation, chemotherapy-induced neuropathy, PTSD and other comorbidities.  She presented with headaches and unsteady gait x several weeks and MRI positive for R cerebellar mass w/ mass effect on fourth vetricle and mod obstructive hydrocephalus.  She is now s/p R suboccipital craniotomy on 5/26 for resection of tumor.    PT Comments    Patient improved over evaluation session in terms of balance and activity tolerance.  She still preferred to lie back in bed rather than stay up in the chair, but more stable with static standing and able to ambulate with less support when using RW though tends to run into obstacles on the R side consistently.  She reports has a "friend" with her during the day as well when her other friend works.  Feel she could go home with 24/7 supervision and possibly do outpatient PT if they can transport her.  MD to follow up with support system.  PT to follow.   Follow Up Recommendations  Outpatient PT;Supervision/Assistance - 24 hour     Equipment Recommendations  Rolling walker with 5" wheels    Recommendations for Other Services       Precautions / Restrictions Precautions Precautions: Fall Restrictions Weight Bearing Restrictions: No    Mobility  Bed Mobility Overal bed mobility: Needs Assistance Bed Mobility: Supine to Sit;Sit to Supine     Supine to sit: Supervision Sit to supine: Supervision   General bed mobility comments: assist for safety with lines  Transfers Overall transfer level: Needs assistance Equipment used: Rolling walker (2 wheeled) Transfers: Sit to/from Stand Sit to Stand: Supervision         General transfer comment: did not use RW initially in standing, S for lines and  safety  Ambulation/Gait Ambulation/Gait assistance: Supervision;Min guard Gait Distance (Feet): 300 Feet Assistive device: Rolling walker (2 wheeled) Gait Pattern/deviations: Step-through pattern;Decreased stride length     General Gait Details: running into obstacles on the R consistently able to maneuver around when given cues several seconds prior to reaching obstacle and with bigger more obvious obstacles, some unsteadiness on turns needing A for safety   Stairs             Wheelchair Mobility    Modified Rankin (Stroke Patients Only)       Balance Overall balance assessment: Needs assistance   Sitting balance-Leahy Scale: Good Sitting balance - Comments: sitting straight up in bed and adjusting herself easily     Standing balance-Leahy Scale: Fair Standing balance comment: standing static without UE support, dynamic with close S               High Level Balance Comments: worked on LE strength and balance in standing including sit <> stand x 5, standing marching and heel raises with UE support, no UE assist reaching to tap targets with alternating hands, reaching to pick up item from floor with min A, standing side stepping with UE support.            Cognition Arousal/Alertness: Awake/alert Behavior During Therapy: Flat affect Overall Cognitive Status: Impaired/Different from baseline Area of Impairment: Attention                   Current Attention Level: Sustained Memory: Decreased short-term memory;Decreased recall of precautions  Problem Solving: Slow processing;Requires verbal cues General Comments: slightly  more interactive this session      Exercises      General Comments General comments (skin integrity, edema, etc.): insistent on returning to supine after session despite placing geomat cushion in her chair; patient reports has another friend who stays with her during the day, "Quillian Quince".  Reports would have assist for  transportation to outpatient PT.  MD to contact her friend.      Pertinent Vitals/Pain Pain Assessment: Faces Faces Pain Scale: Hurts a little bit Pain Location: head Pain Descriptors / Indicators: Discomfort Pain Intervention(s): Monitored during session    Home Living                      Prior Function            PT Goals (current goals can now be found in the care plan section) Progress towards PT goals: Progressing toward goals    Frequency    Min 3X/week      PT Plan Discharge plan needs to be updated    Co-evaluation              AM-PAC PT "6 Clicks" Mobility   Outcome Measure  Help needed turning from your back to your side while in a flat bed without using bedrails?: None Help needed moving from lying on your back to sitting on the side of a flat bed without using bedrails?: None Help needed moving to and from a bed to a chair (including a wheelchair)?: A Little Help needed standing up from a chair using your arms (e.g., wheelchair or bedside chair)?: A Little Help needed to walk in hospital room?: A Little Help needed climbing 3-5 steps with a railing? : A Little 6 Click Score: 20    End of Session   Activity Tolerance: Patient tolerated treatment well Patient left: in bed;with call bell/phone within reach;with bed alarm set   PT Visit Diagnosis: Other abnormalities of gait and mobility (R26.89);Muscle weakness (generalized) (M62.81)     Time: [id number] PT Time Calculation (min) (ACUTE ONLY): 27 min  Charges:  $Gait Training: 8-22 mins $Neuromuscular Re-education: 8-22 mins                     Magda Kiel, [place] [phone] 09/06/2019    Reginia Naas 09/06/2019, 9:54 AM

## 2019-09-06 NOTE — Progress Notes (Signed)
PROGRESS NOTE    Diane Rosario  [id number] DOB: 09-Feb-1976 DOA: 09/02/2019 PCP: Department, [facility]    Brief Narrative:  44 year old female with history of left sided breast cancer status postmastectomy and chemoradiation, chemotherapy-induced neuropathy, PTSD and other comorbidities.  She was thought to be cancer free for last few years.  Presented to the emergency department with unsteady gait and headache.  She had complained of throbbing and sharp bilateral temporal headaches for at least 4 weeks.  In the emergency room, hemodynamically stable, MRI of the brain was notable for a mass within the right cerebellum causing vasogenic edema and mass-effect on the fourth ventricle, moderate obstructive hydrocephalus.   Assessment & Plan:   Active Problems:   Malignant neoplasm of upper-outer quadrant of left breast in female, estrogen receptor positive ([place])   Neoplasm of brain causing mass effect on adjacent structures ([place])   Acute lower UTI   Failure to thrive in adult  Brain tumor with vasogenic edema/mass-effect and obstructive hydrocephalus: Remains on IV steroids. followed by oncology and neuro-oncology. 5/26, cerebellar tumor removal. Postop stable. Managed in neuro ICU. Postop surgical management as per neurosurgery.  Suspected UTI present on admission: Urine culture with multiple species.  With no specific growth, Rocephin for 3 days. Finished therapy.  History of breast cancer status post left mastectomy: Now with metastatic breast cancer.  Followed by her oncologist. Plan is to discharge patient and proceed with PET scan next week.  Anxiety and depression, PTSD and bipolar disorder: Patient on alprazolam that is continued.  She takes risperidone injections 50 mg every 2 weeks.  Ambulatory with PT OT.  Postop management as per neurosurgery. Patient states she lives with her friends and one of them is at home all the time.  If she would like to go home, we  will need to refer for outpatient therapies.  DVT prophylaxis: SCDs Code Status: Full code Family Communication: None.  Patient stated she is talking to her mother. Disposition Plan: Status is: Inpatient  Remains inpatient appropriate because:Inpatient level of care appropriate due to severity of illness   Dispo: The patient is from: Home              Anticipated d/c is to: Home with outpatient therapies.              Anticipated d/c date is: 2 days.              Patient currently is not medically stable to d/c.  Plan per surgery.          Consultants:   Neurosurgery  Oncology  Procedures:   Craniotomy, tumor removal on 5/26.  Antimicrobials:  Anti-infectives (From admission, onward)   Start     Dose/Rate Route Frequency Ordered Stop   09/04/19 2000  ceFAZolin (ANCEF) IVPB 1 g/50 mL premix  Status:  Discontinued     1 g 100 mL/hr over 30 Minutes Intravenous Every 8 hours 09/04/19 1945 09/04/19 1948   09/04/19 1710  bacitracin 50,000 Units in sodium chloride 0.9 % 500 mL irrigation  Status:  Discontinued       As needed 09/04/19 1711 09/04/19 1825   09/04/19 0749  ceFAZolin (ANCEF) IVPB 2g/100 mL premix     2 g 200 mL/hr over 30 Minutes Intravenous 30 min pre-op 09/04/19 0749 09/04/19 1606   09/03/19 0100  cefTRIAXone (ROCEPHIN) 1 g in sodium chloride 0.9 % 100 mL IVPB     1 g 200 mL/hr over 30 Minutes  Intravenous Every 24 hours 09/03/19 0056           Subjective: Seen and examined.  No overnight events.  Working with physical therapy.  She had some unsteadiness, however she was able to manage to mobilize. She thinks she can go home and be safe with her friends around.  Objective: Vitals:   09/06/19 0700 09/06/19 0800 09/06/19 0900 09/06/19 1000  BP: 130/77 128/74 (!) 145/84 129/64  Pulse: 63 (!) 57 (!) 163 (!) 58  Resp: (!) 7 (!) 6 20 12   Temp:  98.5 F (36.9 C)    TempSrc:  Oral    SpO2: 98% 100% 100% 99%  Weight:      Height:        Intake/Output  Summary (Last 24 hours) at 09/06/2019 1116 Last data filed at 09/06/2019 1000 Gross per 24 hour  Intake 1806.48 ml  Output --  Net 1806.48 ml   Filed Weights   09/02/19 2057  Weight: 33.9 kg    Examination:  General exam: Appears calm and comfortable , chronically sick looking.  Flat affect. Postop surgical dressing on her posterior neck intact and dry. Not removed by me. Respiratory system: Clear to auscultation. Respiratory effort normal. Cardiovascular system: S1 & S2 heard, RRR. No JVD, murmurs, rubs, gallops or clicks. Gastrointestinal system: Abdomen is nondistended, soft and nontender.  Central nervous system: Alert and oriented. No focal neurological deficits but with flat affect.    Data Reviewed: I have personally reviewed following labs and imaging studies  CBC: Recent Labs  Lab 09/02/19 2205 09/03/19 0527 09/05/19 0450  WBC 7.8 5.7 9.6  NEUTROABS 4.7  --   --   HGB 14.7 13.4 10.4*  HCT 45.2 40.3 31.9*  MCV 102.3* 99.5 99.7  PLT 239 230 [id number]   Basic Metabolic Panel: Recent Labs  Lab 09/02/19 2205 09/03/19 0527 09/05/19 0450  NA 139 141 134*  K 4.2 4.0 4.2  CL 101 101 97*  CO2 26 31 25   GLUCOSE 104* 143* 120*  BUN 23* 22* 12  CREATININE 0.88 0.83 0.64  CALCIUM 9.4 9.5 9.0   GFR: Estimated Creatinine Clearance: 48 mL/min (by C-G formula based on SCr of 0.64 mg/dL). Liver Function Tests: Recent Labs  Lab 09/02/19 2205  AST 34  ALT 18  ALKPHOS 56  BILITOT 0.4  PROT 7.8  ALBUMIN 4.4   No results for input(s): LIPASE, AMYLASE in the last 168 hours. No results for input(s): AMMONIA in the last 168 hours. Coagulation Profile: No results for input(s): INR, PROTIME in the last 168 hours. Cardiac Enzymes: No results for input(s): CKTOTAL, CKMB, CKMBINDEX, TROPONINI in the last 168 hours. BNP (last 3 results) No results for input(s): PROBNP in the last 8760 hours. HbA1C: Recent Labs    09/04/19 0429  HGBA1C 6.3*   CBG: Recent Labs  Lab  09/05/19 0848 09/05/19 1208 09/05/19 1703 09/05/19 2207 09/06/19 0821  GLUCAP 105* 148* 89 147* 165*   Lipid Profile: No results for input(s): CHOL, HDL, LDLCALC, TRIG, CHOLHDL, LDLDIRECT in the last 72 hours. Thyroid Function Tests: No results for input(s): TSH, T4TOTAL, FREET4, T3FREE, THYROIDAB in the last 72 hours. Anemia Panel: No results for input(s): VITAMINB12, FOLATE, FERRITIN, TIBC, IRON, RETICCTPCT in the last 72 hours. Sepsis Labs: No results for input(s): PROCALCITON, LATICACIDVEN in the last 168 hours.  Recent Results (from the past 240 hour(s))  Culture, Urine     Status: Abnormal   Collection Time: 09/02/19 10:05 PM   Specimen:  Urine, Clean Catch  Result Value Ref Range Status   Specimen Description   Final    URINE, CLEAN CATCH Performed at [facility], [place] [address], [place], [place] [address]    Special Requests   Final    NONE Performed at [facility], [place] [address] [place], [place] [address]    Culture MULTIPLE SPECIES PRESENT, SUGGEST RECOLLECTION (A)  Final   Report Status 09/04/2019 FINAL  Final  SARS Coronavirus 2 by RT PCR (hospital order, performed in [facility] hospital lab) Nasopharyngeal Nasopharyngeal Swab     Status: None   Collection Time: 09/03/19 12:14 AM   Specimen: Nasopharyngeal Swab  Result Value Ref Range Status   SARS Coronavirus 2 NEGATIVE NEGATIVE Final    Comment: (NOTE) SARS-CoV-2 target nucleic acids are NOT DETECTED. The SARS-CoV-2 RNA is generally detectable in upper and lower respiratory specimens during the acute phase of infection. The lowest concentration of SARS-CoV-2 viral copies this assay can detect is 250 copies / mL. A negative result does not preclude SARS-CoV-2 infection and should not be used as the sole basis for treatment or other patient management decisions.  A negative result may occur with improper specimen collection / handling, submission of specimen  other than nasopharyngeal swab, presence of viral mutation(s) within the areas targeted by this assay, and inadequate number of viral copies (<250 copies / mL). A negative result must be combined with clinical observations, patient history, and epidemiological information. Fact Sheet for Patients:   StrictlyIdeas.no Fact Sheet for Healthcare Providers: BankingDealers.co.za This test is not yet approved or cleared  by the [place] FDA and has been authorized for detection and/or diagnosis of SARS-CoV-2 by FDA under an Emergency Use Authorization (EUA).  This EUA will remain in effect (meaning this test can be used) for the duration of the COVID-19 declaration under Section 564(b)(1) of the Act, 21 U.S.C. section 360bbb-3(b)(1), unless the authorization is terminated or revoked sooner. Performed at [facility], [place] [address], [place], [place] [address]   MRSA PCR Screening     Status: None   Collection Time: 09/04/19  7:47 PM   Specimen: Nasal Mucosa; Nasopharyngeal  Result Value Ref Range Status   MRSA by PCR NEGATIVE NEGATIVE Final    Comment:        The GeneXpert MRSA Assay (FDA approved for NASAL specimens only), is one component of a comprehensive MRSA colonization surveillance program. It is not intended to diagnose MRSA infection nor to guide or monitor treatment for MRSA infections. Performed at [facility], [place] [address], [place], [place] [address]          Radiology Studies: MR BRAIN W WO CONTRAST  Result Date: 09/05/2019 CLINICAL DATA:  Cerebellar mass resection EXAM: MRI HEAD WITHOUT AND WITH CONTRAST TECHNIQUE: Multiplanar, multiecho pulse sequences of the brain and surrounding structures were obtained without and with intravenous contrast. CONTRAST:  34mL GADAVIST GADOBUTROL 1 MMOL/ML IV SOLN COMPARISON:  Brain MRI 09/02/2019 FINDINGS: The examination is markedly degraded by motion  due to patient discomfort. Brain: Status post resection of right cerebellar lesion. There is mild diffusion restriction along the resection margin. No other diffusion abnormality. Hydrocephalus is slightly improved. There is persistent periventricular transependymal interstitial edema. There is hyperintense T1-weighted signal within the right cerebellum along the resection margin. Intravenous contrast material was administered, but there is no normal or abnormal contrast enhancement seen. No chronic microhemorrhage. Normal midline structures. Small amount of pneumocephalus suspected  along anterior left convexity. Vascular: Normal flow voids. Skull and upper cervical spine: Right suboccipital craniectomy. Sinuses/Orbits: Negative. Other: None. IMPRESSION: 1. Markedly motion degraded examination. 2. Status post resection of right cerebellar lesion. Though IV contrast agent was administered, no normal or abnormal enhancement is seen anywhere in the head, suggesting delayed transit or extravasation. Potential extravasation was discussed with the patient's nurse by the technologist immediately following the scan. 3. Slight improvement of hydrocephalus with persistent periventricular transependymal interstitial edema. Electronically Signed   By: Ulyses Jarred M.D.   On: 09/05/2019 02:16   DG CHEST PORT 1 VIEW  Result Date: 09/04/2019 CLINICAL DATA:  Central line placement EXAM: PORTABLE CHEST 1 VIEW COMPARISON:  08/19/2019, CT 11/16/2016 FINDINGS: Hyperinflation. Right-sided central venous catheter with tip projecting over the SVC. More radiopaque portion of tubing at the tip and extending 5 cm proximal to the tip. No pneumothorax. Clear lung fields. Normal heart size. Clips in the left axilla. Post mastectomy changes on the left IMPRESSION: Right-sided central venous catheter tip overlies the distal SVC. No pneumothorax. Electronically Signed   By: Donavan Foil M.D.   On: 09/04/2019 19:13        Scheduled  Meds: . Chlorhexidine Gluconate Cloth  6 each Topical Daily  . dexamethasone (DECADRON) injection  4 mg Intravenous Q6H  . feeding supplement (ENSURE ENLIVE)  237 mL Oral QID  . insulin aspart  0-6 Units Subcutaneous TID WC  . multivitamin with minerals  1 tablet Oral Daily  . pantoprazole (PROTONIX) IV  40 mg Intravenous Q12H   Continuous Infusions: . sodium chloride Stopped (09/06/19 0914)  . cefTRIAXone (ROCEPHIN)  IV Stopped (09/06/19 0102)     LOS: 3 days    Time spent: 25 minutes    Barb Merino, MD [facility] Pager [phone]

## 2019-09-06 NOTE — Progress Notes (Signed)
Postop day 2.  Overall patient looks quite good.  Minimal headache.  No new neurologic symptoms.  Has been able to ambulate with therapy.  Afebrile with stable vital signs.  Awake and alert.  Oriented and reasonably appropriate.  Motor and sensory function intact.  Dysmetria mildly improved.  Wound healing well.  Chest and abdomen benign.  Overall progressing well.  Okay to transfer to floor and work towards home discharge.  Patient should remain on 2 mg of Decadron twice daily for 2 weeks.  I will discuss with radiation oncology plans for adjunctive radiation treatment.

## 2019-09-07 LAB — GLUCOSE, CAPILLARY
Glucose-Capillary: 110 mg/dL — ABNORMAL HIGH (ref 70–99)
Glucose-Capillary: 127 mg/dL — ABNORMAL HIGH (ref 70–99)
Glucose-Capillary: 156 mg/dL — ABNORMAL HIGH (ref 70–99)

## 2019-09-07 MED ORDER — DEXAMETHASONE 2 MG PO TABS: 2.0000 mg | ORAL_TABLET | Freq: Two times a day (BID) | ORAL | 0 refills | Status: AC

## 2019-09-07 MED ORDER — PANTOPRAZOLE SODIUM 40 MG PO TBEC
40.0000 mg | DELAYED_RELEASE_TABLET | Freq: Two times a day (BID) | ORAL | Status: DC
  Administered 2019-09-07: 40 mg via ORAL
  Filled 2019-09-07: qty 1

## 2019-09-07 MED ORDER — HYDROCODONE-ACETAMINOPHEN 5-325 MG PO TABS: 1.0000 | ORAL_TABLET | Freq: Four times a day (QID) | ORAL | 0 refills | Status: AC | PRN

## 2019-09-07 NOTE — Progress Notes (Signed)
  NEUROSURGERY PROGRESS NOTE   No issues overnight.  No concerns this am HA manageable. No N/T/W  EXAM:  BP 110/66 (BP Location: Left Leg)   Pulse 68   Temp 98.2 F (36.8 C) (Oral)   Resp 18   Ht 5\' 6"  (1.676 m)   Wt 33.9 kg   SpO2 99%   BMI 12.07 kg/m   Awake, alert, oriented  Speech fluent, appropriate  CN grossly intact  Stable motor Incision: bandage with dried blood. No active drainage  PLAN Progressing well Cleared for d/c from NS perspective Continue decadron 2mg  BID x2 weeks

## 2019-09-07 NOTE — Progress Notes (Signed)
PHARMACIST - PHYSICIAN COMMUNICATION  DR:   Sloan Leiter  CONCERNING: IV to Oral Route Change Policy  RECOMMENDATION: This patient is receiving pantoprazole by the intravenous route.  Based on criteria approved by the Pharmacy and Therapeutics Committee, the intravenous medication(s) is/are being converted to the equivalent oral dose form(s).   DESCRIPTION: These criteria include:  The patient is eating (either orally or via tube) and/or has been taking other orally administered medications for a least 24 hours  The patient has no evidence of active gastrointestinal bleeding or impaired GI absorption (gastrectomy, short bowel, patient on TNA or NPO).  If you have questions about this conversion, please contact the Pharmacy Department  []   [phone] )  [facility] []   [phone] )  [facility] [x]   [phone] )  [facility] []   [phone] )  [facility] []   [phone] )  [place] A. Levada Dy, PharmD, BCPS, FNKF Clinical Pharmacist [place] Please utilize Amion for appropriate phone number to reach the unit pharmacist ([place])   09/07/2019 10:14 AM

## 2019-09-07 NOTE — Discharge Summary (Signed)
Physician Discharge Summary  SARENNA CORFIELD [id number] DOB: 1975/04/14 DOA: 09/02/2019  PCP: Department, [place] date: 09/02/2019 Discharge date: 09/07/2019  Admitted From: Home Disposition: Home  Recommendations for Outpatient Follow-up:  1. Follow up with PCP in 1-2 weeks 2. Follow-up with oncology as scheduled. 3. Neurosurgery to follow-up.  Home Health: Outpatient OT PT Equipment/Devices: Rolling walker  Discharge Condition: Stable CODE STATUS: Full code Diet recommendation: Regular diet, nutritional supplements  Discharge summary: 44 year old female with history of left sided breast cancer status post mastectomy and chemoradiation, chemotherapy-induced neuropathy, PTSD and other comorbidities.  She was thought to be cancer free for last few years.  Presented to the emergency department with unsteady gait and headache.  She had complained of throbbing and sharp bilateral temporal headaches for at least 4 weeks.  In the emergency room, hemodynamically stable, MRI of the brain was notable for a mass within the right cerebellum causing vasogenic edema and mass-effect on the fourth ventricle, moderate obstructive hydrocephalus.  # Brain tumor with vasogenic edema/mass-effect and obstructive hydrocephalus: 5/26, underwent brain surgery, cerebellar tumor removal.  Biopsy pending.  Followed by neurosurgery.  Treated with IV steroids.   5/29, neurosurgery cleared patient for discharge on 2 weeks of steroids.   Seen by oncology and neuro-oncology, they are scheduling follow-up at cancer center.  # Suspected UTI present on admission: Urine culture with multiple species.  With no specific growth, Rocephin for 3 days. Finished therapy.  # History of breast cancer status post left mastectomy: Now with metastatic breast cancer.  Followed by her oncologist. Plan is to discharge patient and proceed with PET scan next week.  # Anxiety and depression, PTSD and bipolar  disorder: Patient on alprazolam that is continued.  She takes risperidone injections 50 mg every 2 weeks. Patient was also prescribed short course of oxycodone for pain management.  Patient lives at home with multiple friends who are very supportive.  She is going home and will continue outpatient physical therapy and occupational therapy to improve her endurance and strength.  Medically stable for discharge.  Discharge Diagnoses:  Active Problems:   Malignant neoplasm of upper-outer quadrant of left breast in female, estrogen receptor positive ([place])   Neoplasm of brain causing mass effect on adjacent structures ([place])   Acute lower UTI   Failure to thrive in adult    Discharge Instructions  Discharge Instructions    Ambulatory referral to Occupational Therapy   Complete by: As directed    Ambulatory referral to Physical Therapy   Complete by: As directed    Diet - low sodium heart healthy   Complete by: As directed    Increase activity slowly   Complete by: As directed      Allergies as of 09/07/2019      Reactions   Asa [aspirin] Hives      Medication List    STOP taking these medications   gabapentin 300 MG capsule Commonly known as: NEURONTIN   lidocaine-prilocaine cream Commonly known as: EMLA   LORazepam 0.5 MG tablet Commonly known as: Ativan   ondansetron 8 MG tablet Commonly known as: Zofran   prochlorperazine 10 MG tablet Commonly known as: COMPAZINE     TAKE these medications   ALPRAZolam 0.5 MG tablet Commonly known as: XANAX Take 0.5 mg by mouth 2 (two) times daily as needed for anxiety.   dexamethasone 2 MG tablet Commonly known as: DECADRON Take 1 tablet (2 mg total) by mouth every 12 (twelve) hours  for 13 days.   Ensure Take 237 mLs by mouth daily.   HYDROcodone-acetaminophen 5-325 MG tablet Commonly known as: NORCO/VICODIN Take 1 tablet by mouth every 6 (six) hours as needed for up to 5 days for moderate pain.   risperiDONE  microspheres 50 MG injection Commonly known as: RISPERDAL CONSTA Inject 50 mg into the muscle every 14 (fourteen) days.   tamoxifen 20 MG tablet Commonly known as: NOLVADEX Take 1 tablet (20 mg total) by mouth 2 (two) times daily.            Durable Medical Equipment  (From admission, onward)         Start     Ordered   09/07/19 1100  For home use only DME 3 n 1  Once     09/07/19 1059   09/07/19 1032  DME Walker  Once    Question Answer Comment  Walker: With 5 Inch Wheels   Patient needs a walker to treat with the following condition Brain tumor ([place])      09/07/19 1032          Allergies  Allergen Reactions  . Diona Fanti [Aspirin] Hives    Consultations:  Oncology  Neurosurgery   Procedures/Studies: DG Chest 2 View  Result Date: 08/19/2019 CLINICAL DATA:  Tachycardia. EXAM: CHEST - 2 VIEW COMPARISON:  12/14/2016 FINDINGS: The previously demonstrated right-sided Port-A-Cath has been removed. The heart size is stable. There is a small pulmonary opacity projecting over the right lower lung zone. There is an additional opacity projecting over the posterior tenth rib on the left. There is no pneumothorax. No large pleural effusion. There is no acute osseous abnormality. The lungs are hyperexpanded. IMPRESSION: 1. No definite acute cardiopulmonary process. 2. Bilateral pulmonary opacities as detailed above. The opacity on the patient's right is favored to represent a nipple shadow, while the left opacity may represent a focal infiltrate or area of atelectasis. A two-view follow-up chest x-ray is recommended in 4-6 weeks to confirm stability or resolution of both of these opacities. 3. Hyperexpanded lungs which can be seen in patients with COPD. Electronically Signed   By: Constance Holster M.D.   On: 08/19/2019 18:27   MR BRAIN W WO CONTRAST  Result Date: 09/05/2019 CLINICAL DATA:  Cerebellar mass resection EXAM: MRI HEAD WITHOUT AND WITH CONTRAST TECHNIQUE: Multiplanar,  multiecho pulse sequences of the brain and surrounding structures were obtained without and with intravenous contrast. CONTRAST:  16mL GADAVIST GADOBUTROL 1 MMOL/ML IV SOLN COMPARISON:  Brain MRI 09/02/2019 FINDINGS: The examination is markedly degraded by motion due to patient discomfort. Brain: Status post resection of right cerebellar lesion. There is mild diffusion restriction along the resection margin. No other diffusion abnormality. Hydrocephalus is slightly improved. There is persistent periventricular transependymal interstitial edema. There is hyperintense T1-weighted signal within the right cerebellum along the resection margin. Intravenous contrast material was administered, but there is no normal or abnormal contrast enhancement seen. No chronic microhemorrhage. Normal midline structures. Small amount of pneumocephalus suspected along anterior left convexity. Vascular: Normal flow voids. Skull and upper cervical spine: Right suboccipital craniectomy. Sinuses/Orbits: Negative. Other: None. IMPRESSION: 1. Markedly motion degraded examination. 2. Status post resection of right cerebellar lesion. Though IV contrast agent was administered, no normal or abnormal enhancement is seen anywhere in the head, suggesting delayed transit or extravasation. Potential extravasation was discussed with the patient's nurse by the technologist immediately following the scan. 3. Slight improvement of hydrocephalus with persistent periventricular transependymal interstitial edema. Electronically Signed  By: Ulyses Jarred M.D.   On: 09/05/2019 02:16   MR Brain W and Wo Contrast  Result Date: 09/02/2019 CLINICAL DATA:  Dizziness for 3 weeks. History of breast carcinoma. EXAM: MRI HEAD WITHOUT AND WITH CONTRAST TECHNIQUE: Multiplanar, multiecho pulse sequences of the brain and surrounding structures were obtained without and with intravenous contrast. CONTRAST:  3.45mL GADAVIST GADOBUTROL 1 MMOL/ML IV SOLN COMPARISON:  None.  FINDINGS: Brain: There is a mass centered within the right cerebellum that measures 5.6 x 3.5 x 3.3 cm this causes vasogenic edema throughout the right cerebellar hemisphere with mass effect on the fourth ventricle and cerebral aqua duct. There is resultant moderate obstructive hydrocephalus of the lateral and third ventricles. There is periventricular hyperintense T2-weighted signal consistent with transependymal interstitial edema. There are no other contrast-enhancing lesions identified. Vascular: Normal flow voids. Skull and upper cervical spine: Normal marrow signal. Sinuses/Orbits: Negative. Other: None. IMPRESSION: 1. Mass centered within the right cerebellum measuring 5.6 x 3.5 x 3.3 cm with vasogenic edema throughout the right cerebellar hemisphere and mass effect on the fourth ventricle and cerebral aqueduct. 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 2. Moderate obstructive hydrocephalus of the lateral and third ventricles with transependymal interstitial edema. 3. Critical Value/emergent results were called by telephone at the time of interpretation on 09/02/2019 at 11:02 pm to provider [facility] , who verbally acknowledged these results. Electronically Signed   By: Ulyses Jarred M.D.   On: 09/02/2019 23:03   DG CHEST PORT 1 VIEW  Result Date: 09/04/2019 CLINICAL DATA:  Central line placement EXAM: PORTABLE CHEST 1 VIEW COMPARISON:  08/19/2019, CT 11/16/2016 FINDINGS: Hyperinflation. Right-sided central venous catheter with tip projecting over the SVC. More radiopaque portion of tubing at the tip and extending 5 cm proximal to the tip. No pneumothorax. Clear lung fields. Normal heart size. Clips in the left axilla. Post mastectomy changes on the left IMPRESSION: Right-sided central venous catheter tip overlies the distal SVC. No pneumothorax. Electronically Signed   By: Donavan Foil M.D.   On: 09/04/2019 19:13    Subjective: Patient seen and examined.  No  overnight events.  Has minimal pain, oxycodone and alprazolam helps.  Denies any nausea vomiting.  She would like to eat plenty of Ensure. She is excited to go home.  Her friend will pick her up at 4:00 that she already arranged.   Discharge Exam: Vitals:   09/07/19 0355 09/07/19 0842  BP: 110/66 103/68  Pulse: 68 66  Resp: 18 16  Temp: 98.2 F (36.8 C) 99.1 F (37.3 C)  SpO2: 99% 97%   Vitals:   09/06/19 1945 09/06/19 2306 09/07/19 0355 09/07/19 0842  BP: 117/80 105/69 110/66 103/68  Pulse: (!) 59 62 68 66  Resp: 18 16 18 16   Temp: 99.4 F (37.4 C) 99.4 F (37.4 C) 98.2 F (36.8 C) 99.1 F (37.3 C)  TempSrc: Oral Oral Oral Oral  SpO2: 99% 98% 99% 97%  Weight:      Height:        General: Pt is alert, awake, not in acute distress, chronically sick looking.  Cachectic.   Postop surgical dressing present, not removed by me. Cardiovascular: RRR, S1/S2 +, no rubs, no gallops Respiratory: CTA bilaterally, no wheezing, no rhonchi Abdominal: Soft, NT, ND, bowel sounds + Extremities: no edema, no cyanosis    The results of significant diagnostics from this hospitalization (including imaging, microbiology, ancillary and laboratory) are listed below for reference.  Microbiology: Recent Results (from the past 240 hour(s))  Culture, Urine     Status: Abnormal   Collection Time: 09/02/19 10:05 PM   Specimen: Urine, Clean Catch  Result Value Ref Range Status   Specimen Description   Final    URINE, CLEAN CATCH Performed at [facility], [place] [address], [place], [place] [address]    Special Requests   Final    NONE Performed at [facility], [place] [address] [place], [place] [address]    Culture MULTIPLE SPECIES PRESENT, SUGGEST RECOLLECTION (A)  Final   Report Status 09/04/2019 FINAL  Final  SARS Coronavirus 2 by RT PCR (hospital order, performed in [facility] hospital lab) Nasopharyngeal Nasopharyngeal Swab     Status: None    Collection Time: 09/03/19 12:14 AM   Specimen: Nasopharyngeal Swab  Result Value Ref Range Status   SARS Coronavirus 2 NEGATIVE NEGATIVE Final    Comment: (NOTE) SARS-CoV-2 target nucleic acids are NOT DETECTED. The SARS-CoV-2 RNA is generally detectable in upper and lower respiratory specimens during the acute phase of infection. The lowest concentration of SARS-CoV-2 viral copies this assay can detect is 250 copies / mL. A negative result does not preclude SARS-CoV-2 infection and should not be used as the sole basis for treatment or other patient management decisions.  A negative result may occur with improper specimen collection / handling, submission of specimen other than nasopharyngeal swab, presence of viral mutation(s) within the areas targeted by this assay, and inadequate number of viral copies (<250 copies / mL). A negative result must be combined with clinical observations, patient history, and epidemiological information. Fact Sheet for Patients:   StrictlyIdeas.no Fact Sheet for Healthcare Providers: BankingDealers.co.za This test is not yet approved or cleared  by the [place] FDA and has been authorized for detection and/or diagnosis of SARS-CoV-2 by FDA under an Emergency Use Authorization (EUA).  This EUA will remain in effect (meaning this test can be used) for the duration of the COVID-19 declaration under Section 564(b)(1) of the Act, 21 U.S.C. section 360bbb-3(b)(1), unless the authorization is terminated or revoked sooner. Performed at [facility], [place] [address], [place], [place] [address]   MRSA PCR Screening     Status: None   Collection Time: 09/04/19  7:47 PM   Specimen: Nasal Mucosa; Nasopharyngeal  Result Value Ref Range Status   MRSA by PCR NEGATIVE NEGATIVE Final    Comment:        The GeneXpert MRSA Assay (FDA approved for NASAL specimens only), is one component of  a comprehensive MRSA colonization surveillance program. It is not intended to diagnose MRSA infection nor to guide or monitor treatment for MRSA infections. Performed at [facility], [place] [address] [place], [place] [address]      Labs: BNP (last 3 results) No results for input(s): BNP in the last 8760 hours. Basic Metabolic Panel: Recent Labs  Lab 09/02/19 2205 09/03/19 0527 09/05/19 0450  NA 139 141 134*  K 4.2 4.0 4.2  CL 101 101 97*  CO2 26 31 25   GLUCOSE 104* 143* 120*  BUN 23* 22* 12  CREATININE 0.88 0.83 0.64  CALCIUM 9.4 9.5 9.0   Liver Function Tests: Recent Labs  Lab 09/02/19 2205  AST 34  ALT 18  ALKPHOS 56  BILITOT 0.4  PROT 7.8  ALBUMIN 4.4   No results for input(s): LIPASE, AMYLASE in the last 168 hours. No results for input(s): AMMONIA in  the last 168 hours. CBC: Recent Labs  Lab 09/02/19 2205 09/03/19 0527 09/05/19 0450  WBC 7.8 5.7 9.6  NEUTROABS 4.7  --   --   HGB 14.7 13.4 10.4*  HCT 45.2 40.3 31.9*  MCV 102.3* 99.5 99.7  PLT 239 230 199   Cardiac Enzymes: No results for input(s): CKTOTAL, CKMB, CKMBINDEX, TROPONINI in the last 168 hours. BNP: Invalid input(s): POCBNP CBG: Recent Labs  Lab 09/06/19 0821 09/06/19 1150 09/06/19 1719 09/06/19 2126 09/07/19 0610  GLUCAP 165* 139* 138* 110* 110*   D-Dimer No results for input(s): DDIMER in the last 72 hours. Hgb A1c No results for input(s): HGBA1C in the last 72 hours. Lipid Profile No results for input(s): CHOL, HDL, LDLCALC, TRIG, CHOLHDL, LDLDIRECT in the last 72 hours. Thyroid function studies No results for input(s): TSH, T4TOTAL, T3FREE, THYROIDAB in the last 72 hours.  Invalid input(s): FREET3 Anemia work up No results for input(s): VITAMINB12, FOLATE, FERRITIN, TIBC, IRON, RETICCTPCT in the last 72 hours. Urinalysis    Component Value Date/Time   COLORURINE AMBER (A) 09/02/2019 2205   APPEARANCEUR CLOUDY (A) 09/02/2019 2205   LABSPEC 1.035 (H)  09/02/2019 2205   PHURINE 5.0 09/02/2019 2205   GLUCOSEU NEGATIVE 09/02/2019 2205   HGBUR SMALL (A) 09/02/2019 2205   BILIRUBINUR NEGATIVE 09/02/2019 2205   KETONESUR NEGATIVE 09/02/2019 2205   PROTEINUR 100 (A) 09/02/2019 2205   UROBILINOGEN 0.2 09/05/2012 1632   NITRITE POSITIVE (A) 09/02/2019 2205   LEUKOCYTESUR MODERATE (A) 09/02/2019 2205   Sepsis Labs Invalid input(s): PROCALCITONIN,  WBC,  LACTICIDVEN Microbiology Recent Results (from the past 240 hour(s))  Culture, Urine     Status: Abnormal   Collection Time: 09/02/19 10:05 PM   Specimen: Urine, Clean Catch  Result Value Ref Range Status   Specimen Description   Final    URINE, CLEAN CATCH Performed at [facility], [place] [address], [place], [place] [address]    Special Requests   Final    NONE Performed at [facility], [place] [address] [place], [place] [address]    Culture MULTIPLE SPECIES PRESENT, SUGGEST RECOLLECTION (A)  Final   Report Status 09/04/2019 FINAL  Final  SARS Coronavirus 2 by RT PCR (hospital order, performed in [facility] hospital lab) Nasopharyngeal Nasopharyngeal Swab     Status: None   Collection Time: 09/03/19 12:14 AM   Specimen: Nasopharyngeal Swab  Result Value Ref Range Status   SARS Coronavirus 2 NEGATIVE NEGATIVE Final    Comment: (NOTE) SARS-CoV-2 target nucleic acids are NOT DETECTED. The SARS-CoV-2 RNA is generally detectable in upper and lower respiratory specimens during the acute phase of infection. The lowest concentration of SARS-CoV-2 viral copies this assay can detect is 250 copies / mL. A negative result does not preclude SARS-CoV-2 infection and should not be used as the sole basis for treatment or other patient management decisions.  A negative result may occur with improper specimen collection / handling, submission of specimen other than nasopharyngeal swab, presence of viral mutation(s) within the areas targeted by this assay, and  inadequate number of viral copies (<250 copies / mL). A negative result must be combined with clinical observations, patient history, and epidemiological information. Fact Sheet for Patients:   StrictlyIdeas.no Fact Sheet for Healthcare Providers: BankingDealers.co.za This test is not yet approved or cleared  by the [place] FDA and has been authorized for detection and/or diagnosis of SARS-CoV-2 by FDA under an Emergency Use Authorization (EUA).  This EUA  will remain in effect (meaning this test can be used) for the duration of the COVID-19 declaration under Section 564(b)(1) of the Act, 21 U.S.C. section 360bbb-3(b)(1), unless the authorization is terminated or revoked sooner. Performed at [facility], [place] [address], [place], [place] [address]   MRSA PCR Screening     Status: None   Collection Time: 09/04/19  7:47 PM   Specimen: Nasal Mucosa; Nasopharyngeal  Result Value Ref Range Status   MRSA by PCR NEGATIVE NEGATIVE Final    Comment:        The GeneXpert MRSA Assay (FDA approved for NASAL specimens only), is one component of a comprehensive MRSA colonization surveillance program. It is not intended to diagnose MRSA infection nor to guide or monitor treatment for MRSA infections. Performed at [facility], [place] [address], [place], [place] [address]      Time coordinating discharge:  35 minutes  SIGNED:   Barb Merino, MD  [facility] 09/07/2019, 11:08 AM

## 2019-09-07 NOTE — TOC Transition Note (Signed)
Transition of Care [facility]) - CM/SW Discharge Note   Patient Details  Name: Diane Rosario MRN: [id number] Date of Birth: 06/27/1975  [facility]) CM/SW Contact:  Carles Collet, RN Phone Number: 09/07/2019, 11:02 AM   Clinical Narrative:    Spoke to patient. She confirms that she will have transport to OP PT OT.This will gove her more service than [facility] PT OT with her Medicaid coverage. She has transport home around 4pm today. Ordered 3/1 and RW to be delivered to room.     Final next level of care: Home/Self Care Barriers to Discharge: No Barriers Identified   Patient Goals and CMS Choice Patient states their goals for this hospitalization and ongoing recovery are:: to go home      Discharge Placement                       Discharge Plan and Services                DME Arranged: 3-N-1, Walker rolling DME Agency: [employer] Date DME Agency Contacted: 09/07/19 Time DME Agency Contacted: 34 Representative spoke with at DME Agency: [place] ([place]) Interventions     Readmission Risk Interventions No flowsheet data found.

## 2019-09-07 NOTE — Progress Notes (Signed)
Patient being discharged home, educated on discharge instructions. Belongings returned to patient and patient leaving with 3/1 and RW.

## 2019-09-08 LAB — BPAM RBC
Blood Product Expiration Date: 202106162359
Blood Product Expiration Date: 202106162359
Unit Type and Rh: 6200
Unit Type and Rh: 6200

## 2019-09-08 LAB — TYPE AND SCREEN
ABO/RH(D): A POS
Antibody Screen: NEGATIVE
Unit division: 0
Unit division: 0

## 2019-09-08 LAB — VITAMIN B1: Vitamin B1 (Thiamine): 98.9 nmol/L (ref 66.5–200.0)

## 2019-09-09 NOTE — Anesthesia Postprocedure Evaluation (Signed)
Anesthesia Post Note  Patient: Diane Rosario  Procedure(s) Performed: SUBOCCIPITAL  CRANIECTOMY FOR TUMOR RESECTION (Right Head)     Patient location during evaluation: PACU Anesthesia Type: General Level of consciousness: awake and patient cooperative Pain management: pain level controlled Vital Signs Assessment: post-procedure vital signs reviewed and stable Respiratory status: spontaneous breathing, nonlabored ventilation, respiratory function stable and patient connected to nasal cannula oxygen Cardiovascular status: blood pressure returned to baseline and stable Postop Assessment: no apparent nausea or vomiting Anesthetic complications: no    Last Vitals:  Vitals:   09/07/19 1129 09/07/19 1528  BP: 114/65 110/78  Pulse: 74 78  Resp: 18 16  Temp: 36.9 C 37 C  SpO2: 98% 100%    Last Pain:  Vitals:   09/07/19 1540  TempSrc:   PainSc: 0-No pain                 Stephania Macfarlane COKER

## 2019-09-10 ENCOUNTER — Other Ambulatory Visit: Payer: Self-pay | Admitting: *Deleted

## 2019-09-10 DIAGNOSIS — C50412 Malignant neoplasm of upper-outer quadrant of left female breast: Secondary | ICD-10-CM

## 2019-09-10 DIAGNOSIS — Z170 Estrogen receptor positive status [ER+]: Secondary | ICD-10-CM

## 2019-09-11 LAB — SURGICAL PATHOLOGY

## 2019-09-16 ENCOUNTER — Inpatient Hospital Stay: Payer: Medicaid Other | Attending: Neurosurgery

## 2019-09-16 DIAGNOSIS — Z9221 Personal history of antineoplastic chemotherapy: Secondary | ICD-10-CM | POA: Insufficient documentation

## 2019-09-16 DIAGNOSIS — R911 Solitary pulmonary nodule: Secondary | ICD-10-CM | POA: Insufficient documentation

## 2019-09-16 DIAGNOSIS — F319 Bipolar disorder, unspecified: Secondary | ICD-10-CM | POA: Insufficient documentation

## 2019-09-16 DIAGNOSIS — Z9012 Acquired absence of left breast and nipple: Secondary | ICD-10-CM | POA: Insufficient documentation

## 2019-09-16 DIAGNOSIS — Z923 Personal history of irradiation: Secondary | ICD-10-CM | POA: Insufficient documentation

## 2019-09-16 DIAGNOSIS — C50412 Malignant neoplasm of upper-outer quadrant of left female breast: Secondary | ICD-10-CM | POA: Insufficient documentation

## 2019-09-16 DIAGNOSIS — C7931 Secondary malignant neoplasm of brain: Secondary | ICD-10-CM | POA: Insufficient documentation

## 2019-09-19 ENCOUNTER — Ambulatory Visit (HOSPITAL_COMMUNITY): Admission: RE | Admit: 2019-09-19 | Payer: Medicaid Other | Source: Ambulatory Visit

## 2019-09-19 NOTE — Progress Notes (Signed)
Patient Care Team: Department, [facility] as PCP - Frederich Chick, MD as Consulting Physician (General Surgery) Nicholas Lose, MD as Consulting Physician (Hematology and Oncology) Kyung Rudd, MD as Consulting Physician (Radiation Oncology) Gardenia Phlegm, NP as Nurse Practitioner (Hematology and Oncology)  DIAGNOSIS:    ICD-10-CM   1. Malignant neoplasm of upper-outer quadrant of left breast in female, estrogen receptor positive ([place])  C50.412    Z17.0     SUMMARY OF ONCOLOGIC HISTORY: Oncology History  Malignant neoplasm of upper-outer quadrant of left breast in female, estrogen receptor positive ([place])  11/01/2016 Initial Diagnosis   Palpable left breast mass with 4 enl axillary lymph nodes 4.8 x 0.8 x 3.1 cm; left axillary mass 1.1 x 0.5 cm, lymph node 1.4 x 0.8 cm biopsy grade 2-3 IDC ER 30% PR 0% HER-2 positive ratio 7.53, Ki-67 20%, T2 N2 MX stage IIIa clinical stage   12/02/2016 Genetic Testing   Negative genetic testing on the STAT panel.  The STAT Breast cancer panel offered by [facility] includes sequencing and rearrangement analysis for the following 9 genes:  ATM, BRCA1, BRCA2, CDH1, CHEK2, PALB2, PTEN, STK11 and TP53.   The report date is December 02, 2016.   12/14/2016 Surgery   Left modified radical mastectomy: IDC grade 3, spans 5 cm and a 2.2 cm with extensive DCIS grade 3, lymphovascular invasion present, margins negative, 17/17 lymph nodes positive, ER 30%, PR 0%, HER-2 positive, Ki-67 20%, T2 N3a M0 stage IIIB AJCC 8   01/10/2017 - 03/27/2018 Chemotherapy   Adjuvant chemotherapy with TCH Perjeta 6 cycles followed by Herceptin Perjeta maintenance (multiple delays and missed doses led to final discontinuation)   06/07/2017 - 07/25/2017 Radiation Therapy   Adjuvant radiation therapy   08/2017 -  Anti-estrogen oral therapy   Tamoxifen daily   09/02/2019 Progression   She was admitted to [facility] from 09/02/19-09/07/19 after presenting to the ED for  headaches and unsteady gait. MRI showed a large cerebellar mass causing obstructive hydrocephalus, and she underwent a resection on 09/04/19. Pathology showed metastatic adenocarcinoma, consistent with breast origin, HER-2 positive (3+), ER/PR negative, Ki67 75%.      CHIEF COMPLIANT: Follow up of newly metastatic breast cancer  INTERVAL HISTORY: Diane Rosario is a 44 y.o. with above-mentioned history of left breast cancer treated with mastectomy, adjuvant chemotherapy, Herceptin Perjeta maintenance, and is currently on antiestrogen therapy with tamoxifen. She was admitted from 09/02/19-09/07/19 after presenting to the ED for headaches and unsteady gait. An MRI showed a large cerebellar mass causing obstructive hydrocephalus, and she underwent a resection on 09/04/19. Pathology showed metastatic adenocarcinoma, consistent with breast origin, HER-2 positive (3+), ER/PR negative, Ki67 75%. She presents to the clinic today to discuss treatment options.   ALLERGIES:  is allergic to asa [aspirin].  MEDICATIONS:  Current Outpatient Medications  Medication Sig Dispense Refill  . ALPRAZolam (XANAX) 0.5 MG tablet Take 0.5 mg by mouth 2 (two) times daily as needed for anxiety.     Marland Kitchen dexamethasone (DECADRON) 2 MG tablet Take 1 tablet (2 mg total) by mouth every 12 (twelve) hours for 13 days. 26 tablet 0  . ENSURE (ENSURE) Take 237 mLs by mouth daily.    . risperiDONE microspheres (RISPERDAL CONSTA) 50 MG injection Inject 50 mg into the muscle every 14 (fourteen) days.    . tamoxifen (NOLVADEX) 20 MG tablet Take 1 tablet (20 mg total) by mouth 2 (two) times daily. (Patient not taking: Reported on 09/02/2019) 90 tablet 3  No current facility-administered medications for this visit.   Facility-Administered Medications Ordered in Other Visits  Medication Dose Route Frequency Provider Last Rate Last Admin  . sodium chloride flush (NS) 0.9 % injection 10 mL  10 mL Intracatheter PRN Nicholas Lose, MD         PHYSICAL EXAMINATION: ECOG PERFORMANCE STATUS: 2 - Symptomatic, <50% confined to bed  Vitals:   09/20/19 1045  BP: 108/81  Pulse: (!) 106  Resp: 18  Temp: 98.7 F (37.1 C)  SpO2: 100%   Filed Weights   09/20/19 1045  Weight: 85 lb 4.8 oz (38.7 kg)    LABORATORY DATA:  I have reviewed the data as listed CMP Latest Ref Rng & Units 09/05/2019 09/03/2019 09/02/2019  Glucose 70 - 99 mg/dL 120(H) 143(H) 104(H)  BUN 6 - 20 mg/dL 12 22(H) 23(H)  Creatinine 0.44 - 1.00 mg/dL 0.64 0.83 0.88  Sodium 135 - 145 mmol/L 134(L) 141 139  Potassium 3.5 - 5.1 mmol/L 4.2 4.0 4.2  Chloride 98 - 111 mmol/L 97(L) 101 101  CO2 22 - 32 mmol/L '25 31 26  '$ Calcium 8.9 - 10.3 mg/dL 9.0 9.5 9.4  Total Protein 6.5 - 8.1 g/dL - - 7.8  Total Bilirubin 0.3 - 1.2 mg/dL - - 0.4  Alkaline Phos 38 - 126 U/L - - 56  AST 15 - 41 U/L - - 34  ALT 0 - 44 U/L - - 18    Lab Results  Component Value Date   WBC 9.6 09/05/2019   HGB 10.4 (L) 09/05/2019   HCT 31.9 (L) 09/05/2019   MCV 99.7 09/05/2019   PLT 199 09/05/2019   NEUTROABS 4.7 09/02/2019    ASSESSMENT & PLAN:  Malignant neoplasm of upper-outer quadrant of left breast in female, estrogen receptor positive ([place]) 12/14/2016: Left modified radical mastectomy: IDC grade 3, spans 5 cm and a 2.2 cm with extensive DCIS grade 3, lymphovascular invasion present, margins negative, 17/17 lymph nodes positive, ER 30%, PR 0%, HER-2 positive, Ki-67 20%, T2 N3a M0 stage IIIB AJCC 8  Patient has long-standing issues with bipolar disorder and substance abuse previously. CT CAP and Bone Scan 11/17/2016: No metastases 11 mm left lower lobe groundglass nodule probably infectious etiology   Treatment plan:  1. Adjuvant chemotherapy with TCH Perjeta every 3 weeks 6 followed by Herceptin Perjeta maintenance for1 year 3. Adjuvant radiation2/27/2019-08/01/2017 4. Followed by adjuvant antiestrogen therapy with tamoxifen  10 yearsstarted  08/21/2017 -------------------------------------------------------------------------   Brain metastases May 2021: Cerebellar mass status post resection breast primary ER/PR negative HER-2 positive with a Ki-67 of 75% Treatment plan: 1.  Radiation therapy 2.  Staging scans 3. Followed by anti-HER-2 therapy with Xeloda and Tucatinib and Herceptin vs Kadcyla Will discuss this after her PET scan  No orders of the defined types were placed in this encounter.  The patient has a good understanding of the overall plan. she agrees with it. she will call with any problems that may develop before the next visit here.  Total time spent: 30 mins including face to face time and time spent for planning, charting and coordination of care  Nicholas Lose, MD 09/20/2019  I, Cloyde Reams Dorshimer, am acting as scribe for Dr. Nicholas Lose.  I have reviewed the above documentation for accuracy and completeness, and I agree with the above.

## 2019-09-20 ENCOUNTER — Other Ambulatory Visit: Payer: Self-pay

## 2019-09-20 ENCOUNTER — Telehealth: Payer: Self-pay | Admitting: Cardiology

## 2019-09-20 ENCOUNTER — Inpatient Hospital Stay (HOSPITAL_BASED_OUTPATIENT_CLINIC_OR_DEPARTMENT_OTHER): Payer: Medicaid Other | Admitting: Hematology and Oncology

## 2019-09-20 DIAGNOSIS — C50412 Malignant neoplasm of upper-outer quadrant of left female breast: Secondary | ICD-10-CM

## 2019-09-20 DIAGNOSIS — Z9221 Personal history of antineoplastic chemotherapy: Secondary | ICD-10-CM | POA: Diagnosis not present

## 2019-09-20 DIAGNOSIS — Z9012 Acquired absence of left breast and nipple: Secondary | ICD-10-CM | POA: Diagnosis not present

## 2019-09-20 DIAGNOSIS — Z170 Estrogen receptor positive status [ER+]: Secondary | ICD-10-CM | POA: Diagnosis not present

## 2019-09-20 DIAGNOSIS — Z923 Personal history of irradiation: Secondary | ICD-10-CM | POA: Diagnosis not present

## 2019-09-20 DIAGNOSIS — R911 Solitary pulmonary nodule: Secondary | ICD-10-CM | POA: Diagnosis not present

## 2019-09-20 DIAGNOSIS — C7931 Secondary malignant neoplasm of brain: Secondary | ICD-10-CM | POA: Diagnosis not present

## 2019-09-20 DIAGNOSIS — F319 Bipolar disorder, unspecified: Secondary | ICD-10-CM | POA: Diagnosis not present

## 2019-09-20 NOTE — Progress Notes (Signed)
Pt requesting transportation assistance for future apts. RN Hotel manager to arrange future transportation.  Pt also provided number for the coordinator.

## 2019-09-20 NOTE — Telephone Encounter (Signed)
   HEENA WOODBURY DOB: [birth year] MRN: [id number]   RIDER WAIVER AND RELEASE OF LIABILITY  For purposes of improving physical access to our facilities, [facility] is pleased to partner with third parties to provide [facility] patients or other authorized individuals the option of convenient, on-demand ground transportation services (the [place]") through use of the technology service that enables users to request on-demand ground transportation from independent third-party providers.  By opting to use and accept these [employer], I, the undersigned, hereby agree on behalf of myself, and on behalf of any minor child using the [employer] for whom I am the parent or legal guardian, as follows:  1. Government social research officer provided to me are provided by independent third-party transportation providers who are not [employer] or employees and who are unaffiliated with [employer]. 2. [facility] is neither a transportation carrier nor a common or public carrier. 3. [facility] has no control over the quality or safety of the transportation that occurs as a result of the [employer]. 4. [facility] cannot guarantee that any third-party transportation provider will complete any arranged transportation service. 5. [facility] makes no representation, warranty, or guarantee regarding the reliability, timeliness, quality, safety, suitability, or availability of any of the Transport Services or that they will be error free. 6. I fully understand that traveling by vehicle involves risks and dangers of serious bodily injury, including permanent disability, paralysis, and death. I agree, on behalf of myself and on behalf of any minor child using the Transport Services for whom I am the parent or legal guardian, that the entire risk arising out of my use of the [employer] remains solely with me, to the maximum extent permitted under applicable law. 7. The [employer] are provided "as is" and "as available." [facility] disclaims all representations and warranties, express, implied or statutory, not expressly set out in these terms, including the implied warranties of merchantability and fitness for a particular purpose. 8. I hereby waive and release [facility], its agents, employees, officers, directors, representatives, insurers, attorneys, assigns, successors, subsidiaries, and affiliates from any and all past, present, or future claims, demands, liabilities, actions, causes of action, or suits of any kind directly or indirectly arising from acceptance and use of the [employer]. 9. I further waive and release [facility] and its affiliates from all present and future liability and responsibility for any injury or death to persons or damages to property caused by or related to the use of the [employer]. 10. I have read this Waiver and Release of Liability, and I understand the terms used in it and their legal significance. This Waiver is freely and voluntarily given with the understanding that my right (as well as the right of any minor child for whom I am the parent or legal guardian using the [employer]) to legal recourse against [facility] in connection with the [employer] is knowingly surrendered in return for use of these services.   I attest that I read the consent document to Emelia Loron, gave Ms. Amenta the opportunity to ask questions and answered the questions asked (if any). I affirm that Emelia Loron then provided consent for she's participation in this program.     Drucie Ip

## 2019-09-20 NOTE — Assessment & Plan Note (Signed)
12/14/2016: Left modified radical mastectomy: IDC grade 3, spans 5 cm and a 2.2 cm with extensive DCIS grade 3, lymphovascular invasion present, margins negative, 17/17 lymph nodes positive, ER 30%, PR 0%, HER-2 positive, Ki-67 20%, T2 N3a M0 stage IIIB AJCC 8  Patient has long-standing issues with bipolar disorder and substance abuse previously. CT CAP and Bone Scan 11/17/2016: No metastases 11 mm left lower lobe groundglass nodule probably infectious etiology   Treatment plan:  1. Adjuvant chemotherapy with TCH Perjeta every 3 weeks 6 followed by Herceptin Perjeta maintenance for1 year 3. Adjuvant radiation2/27/2019-08/01/2017 4. Followed by adjuvant antiestrogen therapy with tamoxifen  10 yearsstarted 08/21/2017 ------------------------------------------------------------------------- Current treatment: Tamoxifen 20 mg daily  Tamoxifen toxicities: Hot flashes and muscle cramps were present even before starting tamoxifen.  Breast cancer surveillance: Right breast mammogram will need to be scheduled.  Return to clinic in 1 year

## 2019-09-23 ENCOUNTER — Other Ambulatory Visit: Payer: Self-pay

## 2019-09-23 ENCOUNTER — Encounter: Payer: Self-pay | Admitting: Radiation Oncology

## 2019-09-23 ENCOUNTER — Telehealth: Payer: Self-pay

## 2019-09-23 NOTE — Telephone Encounter (Signed)
Spoke with patient to advised of telephone appointment with Shona Jani PA on 09/25/19 at 2:30am. Pt verbalized understanding of appointment date and time. Meaningful use questions were reviewed. TM

## 2019-09-25 ENCOUNTER — Ambulatory Visit
Admission: RE | Admit: 2019-09-25 | Discharge: 2019-09-25 | Disposition: A | Discharge status: Home / Self Care | Payer: Medicaid Other | Source: Ambulatory Visit | Attending: Radiation Oncology | Admitting: Radiation Oncology

## 2019-09-25 ENCOUNTER — Other Ambulatory Visit: Payer: Self-pay | Admitting: Radiation Oncology

## 2019-09-25 DIAGNOSIS — C50412 Malignant neoplasm of upper-outer quadrant of left female breast: Secondary | ICD-10-CM

## 2019-09-26 ENCOUNTER — Telehealth: Payer: Self-pay | Admitting: *Deleted

## 2019-09-26 ENCOUNTER — Other Ambulatory Visit: Payer: Self-pay | Admitting: *Deleted

## 2019-09-26 ENCOUNTER — Encounter: Payer: Self-pay | Admitting: Licensed Clinical Social Worker

## 2019-09-26 ENCOUNTER — Other Ambulatory Visit: Payer: Self-pay | Admitting: Oncology

## 2019-09-26 MED ORDER — HYDROCODONE-ACETAMINOPHEN 5-325 MG PO TABS: 1.0000 | ORAL_TABLET | Freq: Four times a day (QID) | ORAL | 0 refills | Status: DC | PRN

## 2019-09-26 MED ORDER — DEXAMETHASONE 1 MG PO TABS
1.0000 mg | ORAL_TABLET | Freq: Two times a day (BID) | ORAL | 0 refills | Status: DC
Start: 2019-09-26 — End: 2020-01-21

## 2019-09-26 NOTE — Progress Notes (Signed)
[facility] Psychosocial Distress Screening Clinical Social Work  Clinical Social Work was referred by distress screening protocol.  The patient scored a 5 on the Psychosocial Distress Thermometer which indicates moderate distress. Clinical Social Worker contacted patient by phone to assess for distress and other psychosocial needs.  Patient is connected with [facility] transportation at this time to get to appointments. She does not have much identified support.  Patient stated her biggest issue right now is that she is in significant pain. States that she did not bring this up during her recent appointments. CSW will notify medical team to determine if there is any medical support that can be given. Patient stated she did not want to talk further today but took this CSW's information so she can call as needed.  ONCBCN DISTRESS SCREENING 09/23/2019  Screening Type   Distress experienced in past week (1-10) 5  Practical problem type   Emotional problem type Adjusting to illness  Information Concerns Type   Physical Problem type   Physician notified of physical symptoms   Referral to clinical social work   Referral to support programs     Clinical Social Worker follow up needed: No.  If yes, follow up plan:  Diane Rosario, [place], LCSW

## 2019-09-26 NOTE — Telephone Encounter (Signed)
Received call from pt with complaint of constant 9/10 headache.  Pt recently underwent brain surgery and completed her home dose of steroids.  Pt also states she has completed her home dose of hydrocodone.  Per Dr. Jana Hakim, pt to take 2 mg of decadron twice a day x1 day and then decrease dose to 1 mg twice a day for 2 weeks.  MD also states he will refill hydrocodone 5-325.  Pt educated on medication adherance and verbalized understanding.

## 2019-09-26 NOTE — Progress Notes (Signed)
Radiation Oncology         [phone] ________________________________  Outpatient  Re-Consultation - Conducted via telephone due to current COVID-19 concerns for limiting patient exposure  I spoke with the patient to conduct this consult visit via telephone to spare the patient unnecessary potential exposure in the healthcare setting during the current COVID-19 pandemic. The patient was notified in advance and was offered a [place] meeting to allow for face to face communication but unfortunately reported that they did not have the appropriate resources/technology to support such a visit and instead preferred to proceed with a telephone consult.    Name: Diane Rosario        MRN: [id number]  Date of Service: 09/25/2019 DOB: February 06, 1976  DH:[id number], [facility], Mallie Mussel, MD     REFERRING PHYSICIAN: Earnie Larsson, MD   DIAGNOSIS: The encounter diagnosis was Malignant neoplasm of upper-outer quadrant of left breast in female, estrogen receptor positive ([place]).   HISTORY OF PRESENT ILLNESS: Diane Rosario is a 44 y.o. female seen at the request of Dr. Lindi Adie and Dr. 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 She was treated in 2019 with mastectomy and chemotherapy as well as adjuvant postmastectomy radiotherapy to the left chest given node positive disease. She was found to have dizziness, headaches, and unsteady gait on 09/02/19 presented to the ED. An MRI revealed a large cerebellar mass measuring 5.6 x 3.5 x 3.3 cm. She underwent a resection on 09/04/19 that revealed metastatic adenocarcinoma consistent with breast primary, which again was HER2 amplified. She is contacted today to discuss next steps of treatment.    PREVIOUS RADIATION THERAPY: Yes   06/07/17 - 08/01/17: Left chest wall and subclavian treated to 50.4 Gy with 28 fx of 1.8 Gy followed by a boost  of 10 Gy with 5 fx of 2 Gy.  This was the planned dose. However, the patient ultimately received a total of 19 treatments out of a planned 33 treatments.   PAST MEDICAL HISTORY:  Past Medical History:  Diagnosis Date  . Anxiety   . Bipolar 1 disorder ([place])    pt reports bipolar, pt reports Auditory hallucinations  . COPD (chronic obstructive pulmonary disease) ([place])   . Depression   . Malignant neoplasm of upper-outer quadrant of left breast in female, estrogen receptor positive ([place]) 11/03/2016  . PTSD (post-traumatic stress disorder)    husband was murdered       PAST SURGICAL HISTORY: Past Surgical History:  Procedure Laterality Date  . EYE SURGERY     both eyes ; correct weak eye muscles   . MASTECTOMY MODIFIED RADICAL Left 12/14/2016   Procedure: LEFT MODIFIED RADICAL MASTECTOMY WITH ERAS PATHWAY;  Surgeon: [name], MD;  Location: [facility];  Service: General;  Laterality: Left;  . PORTACATH PLACEMENT Right 12/14/2016   Procedure: INSERTION PORT-A-CATH WITH ULTRASOUND;  Surgeon: [name], MD;  Location: [facility];  Service: General;  Laterality: Right;  . RETROSIGMOID CRANIECTOMY FOR TUMOR RESECTION Right 09/04/2019   Procedure: SUBOCCIPITAL  CRANIECTOMY FOR TUMOR RESECTION;  Surgeon: Earnie Larsson, MD;  Location: [facility];  Service: Neurosurgery;  Laterality: Right;     FAMILY HISTORY:  Family History  Problem Relation Age of Onset  . Hypertension Maternal Aunt   . Hypertension Maternal Grandmother   . Cirrhosis Father   . Cancer Father  unknown type     SOCIAL HISTORY:  reports that she has been smoking cigarettes. She has a 2.50 pack-year smoking history. She has never used smokeless tobacco. She reports previous alcohol use. She reports current drug use. Drugs: Marijuana, Cocaine, and "Crack" cocaine. The patient is single her uncle Harrie Jeans is an active participant in her care and has previously accompanied her to appointments.  ALLERGIES: Asa  [aspirin]   MEDICATIONS:  Current Outpatient Medications  Medication Sig Dispense Refill  . ALPRAZolam (XANAX) 0.5 MG tablet Take 0.5 mg by mouth 2 (two) times daily as needed for anxiety.     . ENSURE (ENSURE) Take 237 mLs by mouth daily.    . risperiDONE microspheres (RISPERDAL CONSTA) 50 MG injection Inject 50 mg into the muscle every 14 (fourteen) days.    . tamoxifen (NOLVADEX) 20 MG tablet Take 1 tablet (20 mg total) by mouth 2 (two) times daily. (Patient not taking: Reported on 09/23/2019) 90 tablet 3   No current facility-administered medications for this encounter.   Facility-Administered Medications Ordered in Other Encounters  Medication Dose Route Frequency Provider Last Rate Last Admin  . sodium chloride flush (NS) 0.9 % injection 10 mL  10 mL Intracatheter PRN Nicholas Lose, MD         REVIEW OF SYSTEMS: On review of systems, the patient reports that she is doing well overall. She is no longer dizzy. She denies any chest pain, shortness of breath, cough, fevers, chills, night sweats, unintended weight changes. She denies any bowel or bladder disturbances, and denies abdominal pain, nausea or vomiting. She denies any new musculoskeletal or joint aches or pains. A complete review of systems is obtained and is otherwise negative.     PHYSICAL EXAM:  Unable to assess due to encounter type.  ECOG = 1  0 - Asymptomatic (Fully active, able to carry on all predisease activities without restriction)  1 - Symptomatic but completely ambulatory (Restricted in physically strenuous activity but ambulatory and able to carry out work of a light or sedentary nature. For example, light housework, office work)  2 - Symptomatic, <50% in bed during the day (Ambulatory and capable of all self care but unable to carry out any work activities. Up and about more than 50% of waking hours)  3 - Symptomatic, >50% in bed, but not bedbound (Capable of only limited self-care, confined to bed or chair  50% or more of waking hours)  4 - Bedbound (Completely disabled. Cannot carry on any self-care. Totally confined to bed or chair)  5 - Death   Eustace Pen MM, Creech RH, Tormey DC, et al. [phone]). "Toxicity and response criteria of the [facility]". [place] Oncol. 5 (6): 649-55    LABORATORY DATA:  Lab Results  Component Value Date   WBC 9.6 09/05/2019   HGB 10.4 (L) 09/05/2019   HCT 31.9 (L) 09/05/2019   MCV 99.7 09/05/2019   PLT 199 09/05/2019   Lab Results  Component Value Date   NA 134 (L) 09/05/2019   K 4.2 09/05/2019   CL 97 (L) 09/05/2019   CO2 25 09/05/2019   Lab Results  Component Value Date   ALT 18 09/02/2019   AST 34 09/02/2019   ALKPHOS 56 09/02/2019   BILITOT 0.4 09/02/2019      RADIOGRAPHY: MR BRAIN W WO CONTRAST  Result Date: 09/05/2019 CLINICAL DATA:  Cerebellar mass resection EXAM: MRI HEAD WITHOUT AND WITH CONTRAST TECHNIQUE: Multiplanar, multiecho pulse sequences of the  brain and surrounding structures were obtained without and with intravenous contrast. CONTRAST:  71m GADAVIST GADOBUTROL 1 MMOL/ML IV SOLN COMPARISON:  Brain MRI 09/02/2019 FINDINGS: The examination is markedly degraded by motion due to patient discomfort. Brain: Status post resection of right cerebellar lesion. There is mild diffusion restriction along the resection margin. No other diffusion abnormality. Hydrocephalus is slightly improved. There is persistent periventricular transependymal interstitial edema. There is hyperintense T1-weighted signal within the right cerebellum along the resection margin. Intravenous contrast material was administered, but there is no normal or abnormal contrast enhancement seen. No chronic microhemorrhage. Normal midline structures. Small amount of pneumocephalus suspected along anterior left convexity. Vascular: Normal flow voids. Skull and upper cervical spine: Right suboccipital craniectomy. Sinuses/Orbits: Negative. Other: None.  IMPRESSION: 1. Markedly motion degraded examination. 2. Status post resection of right cerebellar lesion. Though IV contrast agent was administered, no normal or abnormal enhancement is seen anywhere in the head, suggesting delayed transit or extravasation. Potential extravasation was discussed with the patient's nurse by the technologist immediately following the scan. 3. Slight improvement of hydrocephalus with persistent periventricular transependymal interstitial edema. Electronically Signed   By: KUlyses JarredM.D.   On: 09/05/2019 02:16   MR Brain W and Wo Contrast  Result Date: 09/02/2019 CLINICAL DATA:  Dizziness for 3 weeks. History of breast carcinoma. EXAM: MRI HEAD WITHOUT AND WITH CONTRAST TECHNIQUE: Multiplanar, multiecho pulse sequences of the brain and surrounding structures were obtained without and with intravenous contrast. CONTRAST:  3.525mGADAVIST GADOBUTROL 1 MMOL/ML IV SOLN COMPARISON:  None. FINDINGS: Brain: There is a mass centered within the right cerebellum that measures 5.6 x 3.5 x 3.3 cm this causes vasogenic edema throughout the right cerebellar hemisphere with mass effect on the fourth ventricle and cerebral aqua duct. There is resultant moderate obstructive hydrocephalus of the lateral and third ventricles. There is periventricular hyperintense T2-weighted signal consistent with transependymal interstitial edema. There are no other contrast-enhancing lesions identified. Vascular: Normal flow voids. Skull and upper cervical spine: Normal marrow signal. Sinuses/Orbits: Negative. Other: None. IMPRESSION: 1. Mass centered within the right cerebellum measuring 5.6 x 3.5 x 3.3 cm with vasogenic edema throughout the right cerebellar hemisphere and mass effect on the fourth ventricle and cerebral aqueduct. 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 2. Moderate obstructive hydrocephalus of the lateral and third ventricles with transependymal interstitial  edema. 3. Critical Value/emergent results were called by telephone at the time of interpretation on 09/02/2019 at 11:02 pm to [facility] who verbally acknowledged these results. Electronically Signed   By: KeUlyses Jarred.D.   On: 09/02/2019 23:03   DG CHEST PORT 1 VIEW  Result Date: 09/04/2019 CLINICAL DATA:  Central line placement EXAM: PORTABLE CHEST 1 VIEW COMPARISON:  08/19/2019, CT 11/16/2016 FINDINGS: Hyperinflation. Right-sided central venous catheter with tip projecting over the SVC. More radiopaque portion of tubing at the tip and extending 5 cm proximal to the tip. No pneumothorax. Clear lung fields. Normal heart size. Clips in the left axilla. Post mastectomy changes on the left IMPRESSION: Right-sided central venous catheter tip overlies the distal SVC. No pneumothorax. Electronically Signed   By: KiDonavan Foil.D.   On: 09/04/2019 19:13       IMPRESSION/PLAN: 1. 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 Dr. MoLisbeth Renshawiscusses the pathology findings and reviews the nature of metastatic disease to the brain. He recommends postoperative  radiotherapy to the posterior fossa to reduce risks of local recurrence. She will have PET imaging for further staging and meet back with Dr. Lindi Adie soon to discuss next steps of systemic therapy. We discussed the risks, benefits, short, and long term effects of radiotherapy, and the patient is interested in proceeding. Dr. Lisbeth Renshaw discusses the delivery and logistics of radiotherapy and anticipates a course of 2 weeks of radiotherapy to the posterior fossa. She will sign written consent on Wednesday next week at the time of simulation. 2. Contraceptive Counseling. She is aware she needs to avoid pregnancy during radiotherapy and will have pretreatment urine pregnancy testing at the time of her PET scan next week. I've spoken with nuclear medicine  and this will be performed on 10/01/19.    Given current concerns for patient exposure during the COVID-19 pandemic, this encounter was conducted via telephone.  The patient has provided two factor identification and has given verbal consent for this type of encounter and has been advised to only accept a meeting of this type in a secure network environment. The time spent during this encounter was 45 minutes including preparation, discussion, and coordination of the patient's care. The attendants for this meeting include  Dr. Lisbeth Renshaw, Hayden Pedro  and Emelia Loron.  During the encounter, Dr. Lisbeth Renshaw, and Hayden Pedro were located at [facility] Radiation Oncology Department.  Emelia Loron was located at home.   The above documentation reflects my direct findings during this shared patient visit. Please see the separate note by Dr. Lisbeth Renshaw on this date for the remainder of the patient's plan of care.    Carola Rhine, PAC

## 2019-10-01 ENCOUNTER — Other Ambulatory Visit: Payer: Self-pay

## 2019-10-01 ENCOUNTER — Ambulatory Visit (HOSPITAL_COMMUNITY)
Admission: RE | Admit: 2019-10-01 | Discharge: 2019-10-01 | Disposition: A | Discharge status: Home / Self Care | Payer: Medicaid Other | Source: Ambulatory Visit | Attending: Hematology and Oncology | Admitting: Hematology and Oncology

## 2019-10-01 DIAGNOSIS — Z170 Estrogen receptor positive status [ER+]: Secondary | ICD-10-CM | POA: Diagnosis present

## 2019-10-01 DIAGNOSIS — C50412 Malignant neoplasm of upper-outer quadrant of left female breast: Secondary | ICD-10-CM | POA: Diagnosis not present

## 2019-10-01 LAB — GLUCOSE, CAPILLARY: Glucose-Capillary: 96 mg/dL (ref 70–99)

## 2019-10-01 LAB — HCG, SERUM, QUALITATIVE: Preg, Serum: NEGATIVE

## 2019-10-01 IMAGING — PT NM PET TUM IMG RESTAG (PS) SKULL BASE T - THIGH
1 of 7 series · 1 of 25 positions shown · non-contrast
Comparison: Eight hundred eighteen chest abdomen and pelvic CTs.

CLINICAL DATA: Subsequent treatment strategy for breast cancer with
brain metastasis. Left mastectomy in [QL].

Right upper extremity [QL] vaccine in GURWINDER.
EXAM:
NUCLEAR MEDICINE PET SKULL BASE TO THIGH
TECHNIQUE: 5.0 mCi F-18 FDG was injected intravenously. Full-ring PET imaging
was performed from the skull base to thigh after the radiotracer. CT
data was obtained and used for attenuation correction and anatomic
localization.
Fasting blood glucose: 96 mg/dl

[Series 3: pet sk_thigh ac · axial · 5.0mm · 4.07mm/px · 1 of 201 slices shown]
[im 101/201]
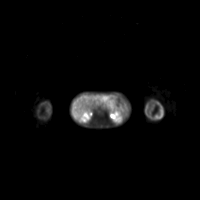

[1 of 25 positions shown; findings below may reference images not displayed]

FINDINGS: Mediastinal blood pool activity: SUV max

Liver activity: SUV max NA

NECK: Right occipital hypermetabolism is likely postoperative, at
the site of craniotomy. No cervical nodal hypermetabolism.
Hypermetabolic "brown" fat within the neck.

Incidental CT findings: No cervical adenopathy.

CHEST: No pulmonary parenchymal or thoracic nodal hypermetabolism.

Incidental CT findings: Left mastectomy and axillary node
dissection. No axillary adenopathy. No internal mammary adenopathy.
No mediastinal adenopathy. Mild centrilobular emphysema.

ABDOMEN/PELVIS: No abdominopelvic parenchymal or nodal
hypermetabolism.

Incidental CT findings: Normal adrenal glands. Large colonic stool
burden.

SKELETON: Focal hypermetabolism about the posteromedial subcutaneous
right thigh, without CT correlate. No abnormal marrow activity.

Incidental CT findings: none
IMPRESSION: 1. No findings of hypermetabolic metastatic disease.
2. Aortic atherosclerosis ([QL]-[QL]) and emphysema ([QL]-[QL]).
3. Left mastectomy and axillary node dissection.
4. Subcutaneous hypermetabolism about the posteromedial right thigh.
Consider physical exam correlation.

## 2019-10-01 MED ORDER — FLUDEOXYGLUCOSE F - 18 (FDG) INJECTION
5.0000 | Freq: Once | INTRAVENOUS | Status: AC
  Administered 2019-10-01: 5 via INTRAVENOUS

## 2019-10-01 NOTE — Progress Notes (Signed)
Error

## 2019-10-02 ENCOUNTER — Ambulatory Visit: Payer: Medicaid Other | Admitting: Radiation Oncology

## 2019-10-02 ENCOUNTER — Other Ambulatory Visit: Payer: Self-pay

## 2019-10-02 ENCOUNTER — Inpatient Hospital Stay: Payer: Medicaid Other | Admitting: Hematology and Oncology

## 2019-10-02 ENCOUNTER — Telehealth: Payer: Self-pay | Admitting: Radiation Oncology

## 2019-10-02 DIAGNOSIS — C50412 Malignant neoplasm of upper-outer quadrant of left female breast: Secondary | ICD-10-CM

## 2019-10-02 DIAGNOSIS — C7931 Secondary malignant neoplasm of brain: Secondary | ICD-10-CM | POA: Insufficient documentation

## 2019-10-02 NOTE — Telephone Encounter (Signed)
I called the patient and she reports she did not stay for her treatment planning because she had another appointment. I reminded her that we had scheduled her appt today to follow her appointment with Dr. Lindi Adie. She said she cannot come before July for her planning but could not explain why. No other appts are scheduled in the system that I can appreciate. She did settle on rescheduling simulation on 7/29 at 2pm.

## 2019-10-02 NOTE — Assessment & Plan Note (Signed)
12/14/2016: Left modified radical mastectomy: IDC grade 3, spans 5 cm and a 2.2 cm with extensive DCIS grade 3, lymphovascular invasion present, margins negative, 17/17 lymph nodes positive, ER 30%, PR 0%, HER-2 positive, Ki-67 20%, T2 N3a M0 stage IIIB AJCC 8  Patient has long-standing issues with bipolar disorder and substance abuse previously. Prior Treatment:  1. Adjuvant chemotherapy with [place] Perjeta every 3 weeks 6 followed by Herceptin Perjeta maintenance for1 year 2. Adjuvant radiation2/27/2019-08/01/2017 3. Followed by adjuvant antiestrogen therapy with tamoxifen  10 yearsstarted 08/21/2017 4. Brain Cerebellar met: S/P resection May 2021: breast cancer ER/PR Neg, Her 2 Pos -------------------------------------------------------------------------------------------------------------------------------------------- Current treatment: XRT PET CT scan 10/01/2019: No findings of systemic metastatic disease. Treatment plan: After radiation is complete we will consider treatment with Kadcyla.

## 2019-10-03 ENCOUNTER — Telehealth: Payer: Self-pay | Admitting: Hematology and Oncology

## 2019-10-03 NOTE — Telephone Encounter (Signed)
No 6/23 los, no changes made to patient schedule

## 2019-10-08 ENCOUNTER — Telehealth: Payer: Self-pay

## 2019-10-08 ENCOUNTER — Ambulatory Visit: Payer: Medicaid Other | Admitting: Radiation Oncology

## 2019-10-08 NOTE — Telephone Encounter (Signed)
Per Shona Deitrick PA reach out to patient uncle Maryfrances Bunnell in regards to transportation for patient sim appointment. Patient has cancelled sim appointments. Per uncle Maryfrances Bunnell [phone]. He is willing to help setup transportation for patient with adequate notification of the appointment date and time. Mr Ouida Sills states that if we schedule an appointment from 10/15/19 on out he can get patient to her appointment. I advised Mr Ouida Sills that I will see if 10/15/19 was available and will reach out to him in regards to that date. Per Mr Ouida Sills if he does not answer please leave a detailed message. TM

## 2019-10-09 ENCOUNTER — Ambulatory Visit: Payer: Medicaid Other

## 2019-10-09 ENCOUNTER — Other Ambulatory Visit: Payer: Self-pay | Admitting: Radiation Therapy

## 2019-10-10 ENCOUNTER — Ambulatory Visit: Payer: Medicaid Other

## 2019-10-11 ENCOUNTER — Ambulatory Visit: Payer: Medicaid Other

## 2019-10-15 ENCOUNTER — Ambulatory Visit: Payer: Medicaid Other

## 2019-10-16 ENCOUNTER — Other Ambulatory Visit: Payer: Self-pay

## 2019-10-16 ENCOUNTER — Ambulatory Visit
Admission: RE | Admit: 2019-10-16 | Discharge: 2019-10-16 | Disposition: A | Discharge status: Home / Self Care | Payer: Medicaid Other | Source: Ambulatory Visit | Attending: Radiation Oncology | Admitting: Radiation Oncology

## 2019-10-16 ENCOUNTER — Ambulatory Visit: Payer: Medicaid Other

## 2019-10-16 DIAGNOSIS — Z510 Encounter for antineoplastic radiation therapy: Secondary | ICD-10-CM | POA: Diagnosis present

## 2019-10-16 DIAGNOSIS — C50412 Malignant neoplasm of upper-outer quadrant of left female breast: Secondary | ICD-10-CM | POA: Insufficient documentation

## 2019-10-16 DIAGNOSIS — C7931 Secondary malignant neoplasm of brain: Secondary | ICD-10-CM | POA: Insufficient documentation

## 2019-10-16 MED ORDER — HYDROCODONE-ACETAMINOPHEN 5-325 MG PO TABS: 1.0000 | ORAL_TABLET | Freq: Four times a day (QID) | ORAL | 0 refills | Status: DC | PRN

## 2019-10-17 ENCOUNTER — Ambulatory Visit: Payer: Medicaid Other

## 2019-10-18 ENCOUNTER — Ambulatory Visit: Payer: Medicaid Other

## 2019-10-18 DIAGNOSIS — Z510 Encounter for antineoplastic radiation therapy: Secondary | ICD-10-CM | POA: Diagnosis not present

## 2019-10-21 ENCOUNTER — Ambulatory Visit: Payer: Medicaid Other

## 2019-10-22 ENCOUNTER — Inpatient Hospital Stay: Payer: Medicaid Other | Attending: Neurosurgery

## 2019-10-22 ENCOUNTER — Ambulatory Visit: Payer: Medicaid Other

## 2019-10-24 ENCOUNTER — Ambulatory Visit: Admission: RE | Admit: 2019-10-24 | Payer: Medicaid Other | Source: Ambulatory Visit | Admitting: Radiation Oncology

## 2019-10-25 ENCOUNTER — Ambulatory Visit: Payer: Medicaid Other | Admitting: Radiation Oncology

## 2019-10-28 ENCOUNTER — Ambulatory Visit: Payer: Medicaid Other

## 2019-10-29 ENCOUNTER — Ambulatory Visit
Admission: RE | Admit: 2019-10-29 | Discharge: 2019-10-29 | Disposition: A | Discharge status: Home / Self Care | Payer: Medicaid Other | Source: Ambulatory Visit | Attending: Radiation Oncology | Admitting: Radiation Oncology

## 2019-10-29 DIAGNOSIS — Z510 Encounter for antineoplastic radiation therapy: Secondary | ICD-10-CM | POA: Diagnosis not present

## 2019-10-30 ENCOUNTER — Ambulatory Visit: Payer: Medicaid Other

## 2019-10-31 ENCOUNTER — Ambulatory Visit
Admission: RE | Admit: 2019-10-31 | Discharge: 2019-10-31 | Disposition: A | Discharge status: Home / Self Care | Payer: Medicaid Other | Source: Ambulatory Visit | Attending: Radiation Oncology | Admitting: Radiation Oncology

## 2019-10-31 DIAGNOSIS — Z510 Encounter for antineoplastic radiation therapy: Secondary | ICD-10-CM | POA: Diagnosis not present

## 2019-11-01 ENCOUNTER — Ambulatory Visit: Payer: Medicaid Other

## 2019-11-04 ENCOUNTER — Ambulatory Visit: Payer: Medicaid Other

## 2019-11-05 ENCOUNTER — Ambulatory Visit: Payer: Medicaid Other

## 2019-11-06 ENCOUNTER — Ambulatory Visit: Payer: Medicaid Other

## 2019-11-07 ENCOUNTER — Ambulatory Visit: Payer: Medicaid Other

## 2019-11-08 ENCOUNTER — Ambulatory Visit: Payer: Medicaid Other

## 2019-11-11 ENCOUNTER — Ambulatory Visit: Payer: Medicaid Other

## 2019-11-12 ENCOUNTER — Ambulatory Visit: Payer: Medicaid Other

## 2019-11-13 ENCOUNTER — Ambulatory Visit: Payer: Medicaid Other

## 2019-11-13 ENCOUNTER — Telehealth: Payer: Self-pay | Admitting: *Deleted

## 2019-11-13 DIAGNOSIS — C7931 Secondary malignant neoplasm of brain: Secondary | ICD-10-CM | POA: Insufficient documentation

## 2019-11-13 DIAGNOSIS — Z510 Encounter for antineoplastic radiation therapy: Secondary | ICD-10-CM | POA: Insufficient documentation

## 2019-11-13 DIAGNOSIS — C50412 Malignant neoplasm of upper-outer quadrant of left female breast: Secondary | ICD-10-CM | POA: Insufficient documentation

## 2019-11-13 NOTE — Telephone Encounter (Signed)
RETURNED [facility] PHONE CALL, SPOKE WITH CHARLES ANDERSON 

## 2019-11-14 ENCOUNTER — Ambulatory Visit: Payer: Medicaid Other

## 2019-11-15 ENCOUNTER — Ambulatory Visit: Payer: Medicaid Other

## 2019-11-15 ENCOUNTER — Ambulatory Visit: Admission: RE | Admit: 2019-11-15 | Payer: Medicaid Other | Source: Ambulatory Visit

## 2019-11-18 ENCOUNTER — Ambulatory Visit: Payer: Medicaid Other

## 2019-11-19 ENCOUNTER — Ambulatory Visit: Payer: Medicaid Other

## 2019-11-20 ENCOUNTER — Ambulatory Visit: Payer: Medicaid Other

## 2019-11-20 ENCOUNTER — Other Ambulatory Visit: Payer: Self-pay

## 2019-11-20 ENCOUNTER — Ambulatory Visit
Admission: RE | Admit: 2019-11-20 | Discharge: 2019-11-20 | Disposition: A | Discharge status: Home / Self Care | Payer: Medicaid Other | Source: Ambulatory Visit | Attending: Radiation Oncology | Admitting: Radiation Oncology

## 2019-11-20 DIAGNOSIS — Z510 Encounter for antineoplastic radiation therapy: Secondary | ICD-10-CM | POA: Diagnosis not present

## 2019-11-20 DIAGNOSIS — C50412 Malignant neoplasm of upper-outer quadrant of left female breast: Secondary | ICD-10-CM | POA: Diagnosis present

## 2019-11-20 DIAGNOSIS — C7931 Secondary malignant neoplasm of brain: Secondary | ICD-10-CM | POA: Diagnosis present

## 2019-11-21 ENCOUNTER — Ambulatory Visit: Payer: Medicaid Other

## 2019-11-22 ENCOUNTER — Ambulatory Visit
Admission: RE | Admit: 2019-11-22 | Discharge: 2019-11-22 | Disposition: A | Discharge status: Home / Self Care | Payer: Medicaid Other | Source: Ambulatory Visit | Attending: Radiation Oncology | Admitting: Radiation Oncology

## 2019-11-22 ENCOUNTER — Ambulatory Visit: Payer: Medicaid Other

## 2019-11-22 DIAGNOSIS — Z510 Encounter for antineoplastic radiation therapy: Secondary | ICD-10-CM | POA: Diagnosis not present

## 2019-11-25 ENCOUNTER — Ambulatory Visit: Payer: Medicaid Other

## 2019-11-26 ENCOUNTER — Ambulatory Visit
Admission: RE | Admit: 2019-11-26 | Discharge: 2019-11-26 | Disposition: A | Discharge status: Home / Self Care | Payer: Medicaid Other | Source: Ambulatory Visit | Attending: Radiation Oncology | Admitting: Radiation Oncology

## 2019-11-26 ENCOUNTER — Ambulatory Visit: Payer: Medicaid Other

## 2019-11-26 ENCOUNTER — Other Ambulatory Visit: Payer: Self-pay

## 2019-11-26 DIAGNOSIS — Z510 Encounter for antineoplastic radiation therapy: Secondary | ICD-10-CM | POA: Diagnosis not present

## 2019-11-27 ENCOUNTER — Ambulatory Visit
Admission: RE | Admit: 2019-11-27 | Discharge: 2019-11-27 | Disposition: A | Discharge status: Home / Self Care | Payer: Medicaid Other | Source: Ambulatory Visit | Attending: Radiation Oncology | Admitting: Radiation Oncology

## 2019-11-27 ENCOUNTER — Ambulatory Visit: Payer: Medicaid Other

## 2019-11-27 ENCOUNTER — Telehealth: Payer: Self-pay | Admitting: *Deleted

## 2019-11-27 ENCOUNTER — Other Ambulatory Visit: Payer: Self-pay

## 2019-11-27 DIAGNOSIS — Z510 Encounter for antineoplastic radiation therapy: Secondary | ICD-10-CM | POA: Diagnosis not present

## 2019-11-27 NOTE — Telephone Encounter (Signed)
RETURNED [employer] CALL, SPOKE WITH [place]

## 2019-11-28 ENCOUNTER — Ambulatory Visit: Payer: Medicaid Other

## 2019-11-29 ENCOUNTER — Ambulatory Visit: Payer: Medicaid Other

## 2019-11-29 ENCOUNTER — Other Ambulatory Visit: Payer: Self-pay

## 2019-11-29 ENCOUNTER — Ambulatory Visit
Admission: RE | Admit: 2019-11-29 | Discharge: 2019-11-29 | Disposition: A | Discharge status: Home / Self Care | Payer: Medicaid Other | Source: Ambulatory Visit | Attending: Radiation Oncology | Admitting: Radiation Oncology

## 2019-11-29 DIAGNOSIS — Z510 Encounter for antineoplastic radiation therapy: Secondary | ICD-10-CM | POA: Diagnosis not present

## 2019-12-02 ENCOUNTER — Ambulatory Visit: Payer: Medicaid Other

## 2019-12-02 ENCOUNTER — Other Ambulatory Visit: Payer: Self-pay

## 2019-12-02 ENCOUNTER — Ambulatory Visit
Admission: RE | Admit: 2019-12-02 | Discharge: 2019-12-02 | Disposition: A | Discharge status: Home / Self Care | Payer: Medicaid Other | Source: Ambulatory Visit | Attending: Radiation Oncology | Admitting: Radiation Oncology

## 2019-12-02 DIAGNOSIS — Z510 Encounter for antineoplastic radiation therapy: Secondary | ICD-10-CM | POA: Diagnosis not present

## 2019-12-03 ENCOUNTER — Ambulatory Visit: Payer: Medicaid Other

## 2019-12-03 ENCOUNTER — Telehealth: Payer: Self-pay | Admitting: *Deleted

## 2019-12-03 NOTE — Telephone Encounter (Signed)
Returned [employer] call, lvm for a return call

## 2019-12-04 ENCOUNTER — Other Ambulatory Visit: Payer: Self-pay

## 2019-12-04 ENCOUNTER — Ambulatory Visit: Payer: Medicaid Other

## 2019-12-04 ENCOUNTER — Ambulatory Visit
Admission: RE | Admit: 2019-12-04 | Discharge: 2019-12-04 | Disposition: A | Discharge status: Home / Self Care | Payer: Medicaid Other | Source: Ambulatory Visit | Attending: Radiation Oncology | Admitting: Radiation Oncology

## 2019-12-04 ENCOUNTER — Encounter: Payer: Self-pay | Admitting: Radiation Oncology

## 2019-12-04 DIAGNOSIS — Z510 Encounter for antineoplastic radiation therapy: Secondary | ICD-10-CM | POA: Diagnosis not present

## 2019-12-05 ENCOUNTER — Ambulatory Visit: Payer: Medicaid Other

## 2019-12-06 ENCOUNTER — Ambulatory Visit: Admission: RE | Admit: 2019-12-06 | Payer: Medicaid Other | Source: Ambulatory Visit

## 2019-12-09 ENCOUNTER — Ambulatory Visit: Payer: Medicaid Other

## 2019-12-10 ENCOUNTER — Ambulatory Visit: Payer: Medicaid Other

## 2019-12-11 ENCOUNTER — Ambulatory Visit: Payer: Medicaid Other | Attending: Radiation Oncology

## 2019-12-11 DIAGNOSIS — Z510 Encounter for antineoplastic radiation therapy: Secondary | ICD-10-CM | POA: Insufficient documentation

## 2019-12-11 DIAGNOSIS — C7931 Secondary malignant neoplasm of brain: Secondary | ICD-10-CM | POA: Insufficient documentation

## 2019-12-11 DIAGNOSIS — C50412 Malignant neoplasm of upper-outer quadrant of left female breast: Secondary | ICD-10-CM | POA: Insufficient documentation

## 2019-12-12 ENCOUNTER — Ambulatory Visit: Payer: Medicaid Other

## 2019-12-13 ENCOUNTER — Ambulatory Visit: Payer: Medicaid Other

## 2019-12-17 ENCOUNTER — Ambulatory Visit: Payer: Medicaid Other

## 2019-12-30 NOTE — Progress Notes (Signed)
  Radiation Oncology         (336) [phone] ________________________________  Name: Diane Rosario MRN: [id number]  Date: 12/04/2019  DOB: 10-26-1975  End of Treatment Note  Diagnosis:   brain metastasis   Indication for treatment::  palliative       Radiation treatment dates:   10/29/19 - 12/04/19  Site/dose:   The patient was treated with a course of whole brain radiation therapy to a dose of 30 Gy in 10 fractions.  This was accomplished using a 2 field technique with additional reduced fields as necessary to improve dose homogeneity.  Narrative: The patient tolerated radiation but experienced significant delays/ difficulty coming daily for XRT. She discontinued XRT after 9 fractions totaling 27 Gy.   Plan: The patient has completed radiation treatment. The patient will return to radiation oncology clinic prn.  ________________________________  Jodelle Gross, M.D., Ph.D.

## 2020-01-06 ENCOUNTER — Telehealth: Payer: Self-pay | Admitting: *Deleted

## 2020-01-06 ENCOUNTER — Telehealth: Payer: Self-pay | Admitting: Radiation Oncology

## 2020-01-06 NOTE — Telephone Encounter (Signed)
  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  Interval Since Last Radiation:  5 weeks   10/29/19 - 12/04/19: The patient  tolerated radiation but experienced significant delays/ difficulty coming daily for XRT. She discontinued XRT after 9 fractions totaling 27 Gy to the whole brain radiation therapy though her prescription was for a dose of 30 Gy in 10 fractions.    06/07/17 - 08/01/17: Left chest wall and subclavian nodes treated to 50.4 Gy with 28 fx of 1.8 Gy followed by a boost of 10 Gy with 5 fx of 2 Gy. This was the planned dose. However, the patient ultimately received a total of 19 treatments out of a planned 33 treatments.  Narrative:  The patient was contacted today for routine follow-up. During treatment she did very well with radiotherapy and did not have significant desquamation, but as above her treatment was significantly delayed due to no showing appointments. She reports she is doing well without concerns of side effects from treatment. She is interested in being followed and needs to reschedule with Dr. Lindi Adie.  Impression/Plan: 1.  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 The patient has been doing well since completion of radiotherapy. We discussed that we would recommend procceding with repeat imaging of the brain in our brain oncology program. She will be due for an MRI in about 2 months. She will also continue to follow up with Dr. Lindi Adie in medical oncology.    Carola Rhine, PAC

## 2020-01-06 NOTE — Telephone Encounter (Signed)
Pt called requesting appt with Dr.Gudena. Pt did not state reason for visit when asked. Message sent to scheduling

## 2020-01-07 ENCOUNTER — Telehealth: Payer: Self-pay | Admitting: Hematology and Oncology

## 2020-01-07 NOTE — Telephone Encounter (Signed)
Scheduled per 9/27 staff msg. Called and spoke with pt, confirmed 10/11 appt

## 2020-01-20 ENCOUNTER — Encounter (HOSPITAL_COMMUNITY): Payer: Self-pay

## 2020-01-20 ENCOUNTER — Other Ambulatory Visit: Payer: Self-pay

## 2020-01-20 ENCOUNTER — Ambulatory Visit: Payer: Medicaid Other | Admitting: Hematology and Oncology

## 2020-01-20 ENCOUNTER — Inpatient Hospital Stay (HOSPITAL_COMMUNITY)
Admission: EM | Admit: 2020-01-20 | Discharge: 2020-01-23 | DRG: 543 | Disposition: A | Discharge status: Skilled Nursing Facility | Payer: Medicaid Other | Attending: Internal Medicine | Admitting: Internal Medicine

## 2020-01-20 ENCOUNTER — Emergency Department (HOSPITAL_COMMUNITY): Payer: Medicaid Other

## 2020-01-20 ENCOUNTER — Telehealth: Payer: Self-pay | Admitting: *Deleted

## 2020-01-20 DIAGNOSIS — Z66 Do not resuscitate: Secondary | ICD-10-CM | POA: Diagnosis present

## 2020-01-20 DIAGNOSIS — F1210 Cannabis abuse, uncomplicated: Secondary | ICD-10-CM | POA: Diagnosis present

## 2020-01-20 DIAGNOSIS — G620 Drug-induced polyneuropathy: Secondary | ICD-10-CM | POA: Diagnosis present

## 2020-01-20 DIAGNOSIS — R531 Weakness: Secondary | ICD-10-CM | POA: Diagnosis present

## 2020-01-20 DIAGNOSIS — Z809 Family history of malignant neoplasm, unspecified: Secondary | ICD-10-CM

## 2020-01-20 DIAGNOSIS — C7949 Secondary malignant neoplasm of other parts of nervous system: Secondary | ICD-10-CM | POA: Diagnosis present

## 2020-01-20 DIAGNOSIS — Z8249 Family history of ischemic heart disease and other diseases of the circulatory system: Secondary | ICD-10-CM

## 2020-01-20 DIAGNOSIS — F419 Anxiety disorder, unspecified: Secondary | ICD-10-CM | POA: Diagnosis not present

## 2020-01-20 DIAGNOSIS — C799 Secondary malignant neoplasm of unspecified site: Secondary | ICD-10-CM

## 2020-01-20 DIAGNOSIS — Z20822 Contact with and (suspected) exposure to covid-19: Secondary | ICD-10-CM | POA: Diagnosis present

## 2020-01-20 DIAGNOSIS — C50412 Malignant neoplasm of upper-outer quadrant of left female breast: Secondary | ICD-10-CM

## 2020-01-20 DIAGNOSIS — Z9012 Acquired absence of left breast and nipple: Secondary | ICD-10-CM | POA: Diagnosis not present

## 2020-01-20 DIAGNOSIS — C7931 Secondary malignant neoplasm of brain: Secondary | ICD-10-CM | POA: Diagnosis present

## 2020-01-20 DIAGNOSIS — F1510 Other stimulant abuse, uncomplicated: Secondary | ICD-10-CM | POA: Diagnosis present

## 2020-01-20 DIAGNOSIS — Z923 Personal history of irradiation: Secondary | ICD-10-CM

## 2020-01-20 DIAGNOSIS — F1410 Cocaine abuse, uncomplicated: Secondary | ICD-10-CM | POA: Diagnosis present

## 2020-01-20 DIAGNOSIS — R296 Repeated falls: Secondary | ICD-10-CM | POA: Diagnosis not present

## 2020-01-20 DIAGNOSIS — F4310 Post-traumatic stress disorder, unspecified: Secondary | ICD-10-CM | POA: Diagnosis present

## 2020-01-20 DIAGNOSIS — Y929 Unspecified place or not applicable: Secondary | ICD-10-CM | POA: Diagnosis not present

## 2020-01-20 DIAGNOSIS — T451X5A Adverse effect of antineoplastic and immunosuppressive drugs, initial encounter: Secondary | ICD-10-CM | POA: Diagnosis not present

## 2020-01-20 DIAGNOSIS — Z170 Estrogen receptor positive status [ER+]: Secondary | ICD-10-CM

## 2020-01-20 DIAGNOSIS — F17210 Nicotine dependence, cigarettes, uncomplicated: Secondary | ICD-10-CM | POA: Diagnosis present

## 2020-01-20 DIAGNOSIS — J449 Chronic obstructive pulmonary disease, unspecified: Secondary | ICD-10-CM | POA: Diagnosis present

## 2020-01-20 DIAGNOSIS — Z9221 Personal history of antineoplastic chemotherapy: Secondary | ICD-10-CM | POA: Diagnosis not present

## 2020-01-20 DIAGNOSIS — C50812 Malignant neoplasm of overlapping sites of left female breast: Secondary | ICD-10-CM | POA: Diagnosis present

## 2020-01-20 DIAGNOSIS — R54 Age-related physical debility: Secondary | ICD-10-CM | POA: Diagnosis not present

## 2020-01-20 DIAGNOSIS — R29898 Other symptoms and signs involving the musculoskeletal system: Secondary | ICD-10-CM | POA: Diagnosis present

## 2020-01-20 DIAGNOSIS — C7951 Secondary malignant neoplasm of bone: Secondary | ICD-10-CM | POA: Diagnosis present

## 2020-01-20 DIAGNOSIS — F319 Bipolar disorder, unspecified: Secondary | ICD-10-CM | POA: Diagnosis not present

## 2020-01-20 DIAGNOSIS — Z515 Encounter for palliative care: Secondary | ICD-10-CM

## 2020-01-20 LAB — RESPIRATORY PANEL BY RT PCR (FLU A&B, COVID)
Influenza A by PCR: NEGATIVE
Influenza B by PCR: NEGATIVE
SARS Coronavirus 2 by RT PCR: NEGATIVE

## 2020-01-20 LAB — CBC WITH DIFFERENTIAL/PLATELET
Abs Immature Granulocytes: 0 10*3/uL (ref 0.00–0.07)
Basophils Absolute: 0 10*3/uL (ref 0.0–0.1)
Basophils Relative: 1 %
Eosinophils Absolute: 0 10*3/uL (ref 0.0–0.5)
Eosinophils Relative: 1 %
HCT: 41.9 % (ref 36.0–46.0)
Hemoglobin: 13.9 g/dL (ref 12.0–15.0)
Immature Granulocytes: 0 %
Lymphocytes Relative: 33 %
Lymphs Abs: 1.5 10*3/uL (ref 0.7–4.0)
MCH: 32.7 pg (ref 26.0–34.0)
MCHC: 33.2 g/dL (ref 30.0–36.0)
MCV: 98.6 fL (ref 80.0–100.0)
Monocytes Absolute: 0.4 10*3/uL (ref 0.1–1.0)
Monocytes Relative: 10 %
Neutro Abs: 2.5 10*3/uL (ref 1.7–7.7)
Neutrophils Relative %: 55 %
Platelets: 336 10*3/uL (ref 150–400)
RBC: 4.25 MIL/uL (ref 3.87–5.11)
RDW: 12 % (ref 11.5–15.5)
WBC: 4.5 10*3/uL (ref 4.0–10.5)
nRBC: 0 % (ref 0.0–0.2)

## 2020-01-20 LAB — CBG MONITORING, ED: Glucose-Capillary: 128 mg/dL — ABNORMAL HIGH (ref 70–99)

## 2020-01-20 LAB — COMPREHENSIVE METABOLIC PANEL
ALT: 20 U/L (ref 0–44)
AST: 28 U/L (ref 15–41)
Albumin: 4.2 g/dL (ref 3.5–5.0)
Alkaline Phosphatase: 54 U/L (ref 38–126)
Anion gap: 13 (ref 5–15)
BUN: 21 mg/dL — ABNORMAL HIGH (ref 6–20)
CO2: 30 mmol/L (ref 22–32)
Calcium: 10.1 mg/dL (ref 8.9–10.3)
Chloride: 99 mmol/L (ref 98–111)
Creatinine, Ser: 0.86 mg/dL (ref 0.44–1.00)
GFR, Estimated: >60 mL/min (ref >60)
Glucose, Bld: 78 mg/dL (ref 70–99)
Potassium: 4.2 mmol/L (ref 3.5–5.1)
Sodium: 142 mmol/L (ref 135–145)
Total Bilirubin: 0.6 mg/dL (ref 0.3–1.2)
Total Protein: 8 g/dL (ref 6.5–8.1)

## 2020-01-20 IMAGING — CT CT HEAD W/O CM
3 series · 15 of 47 positions shown, 18 images · non-contrast
Comparison: Brain MRI dated [DATE].

CLINICAL DATA: 44-year-old female with leg weakness. History of
breast cancer with metastasis to brain.

EXAM:
CT HEAD WITHOUT CONTRAST
TECHNIQUE: Contiguous axial images were obtained from the base of the skull
through the vertex without intravenous contrast.

[Series 2: head wo · axial · 0.47mm/px · z∈[-167,-42]mm · 9 of 31 slices shown, 12 images]
[im 3/31  brain]
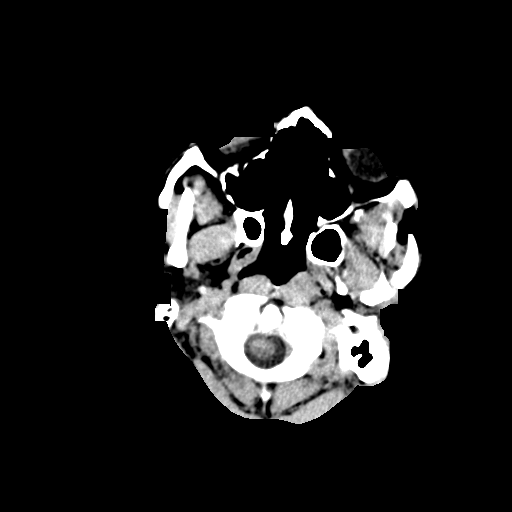
[im 3/31  bone]
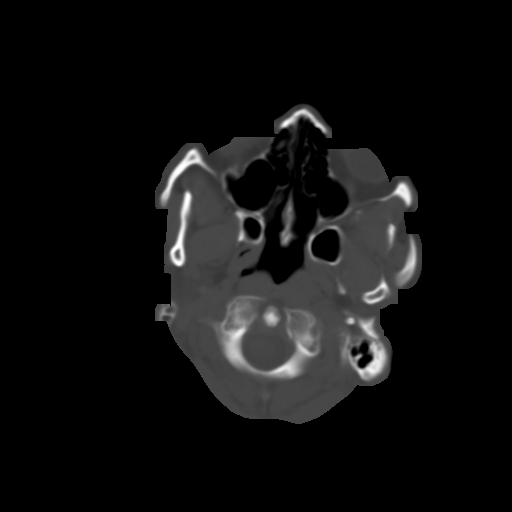
[im 6/31  brain]
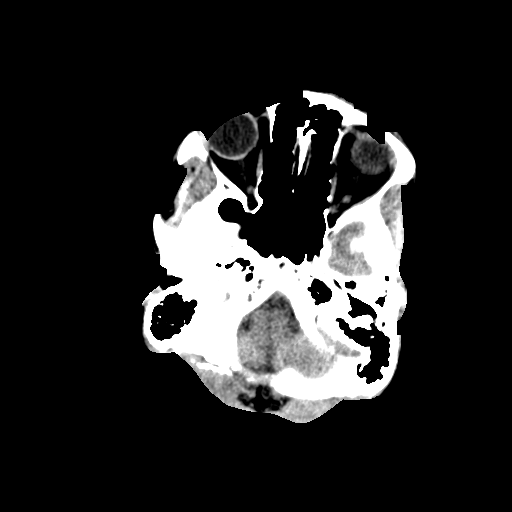
[im 9/31  brain]
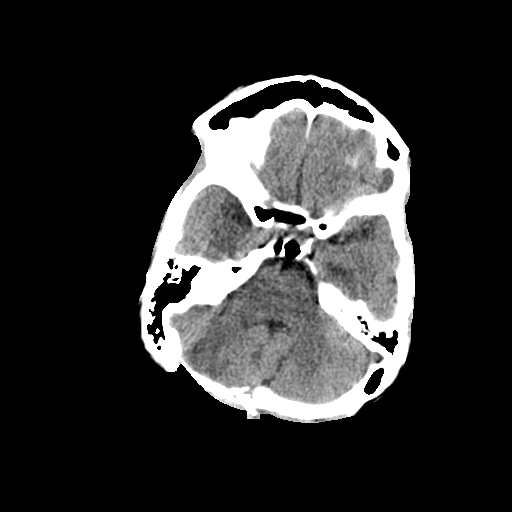
[im 12/31  brain]
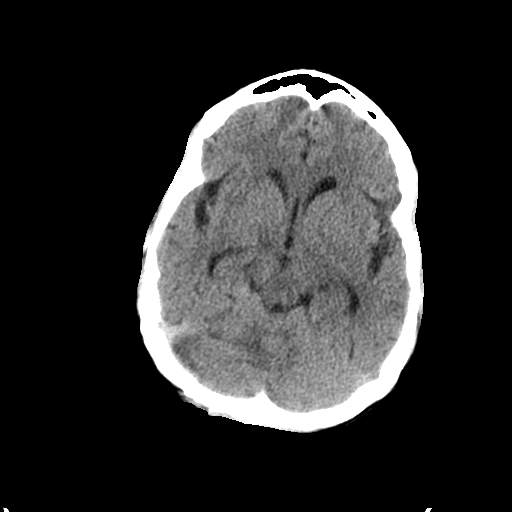
[im 16/31  brain]
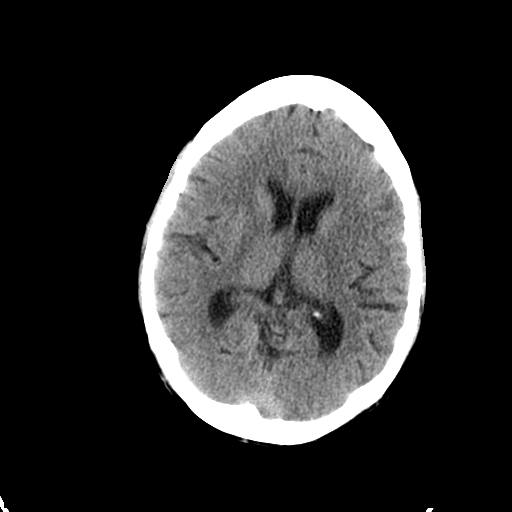
[im 16/31  bone]
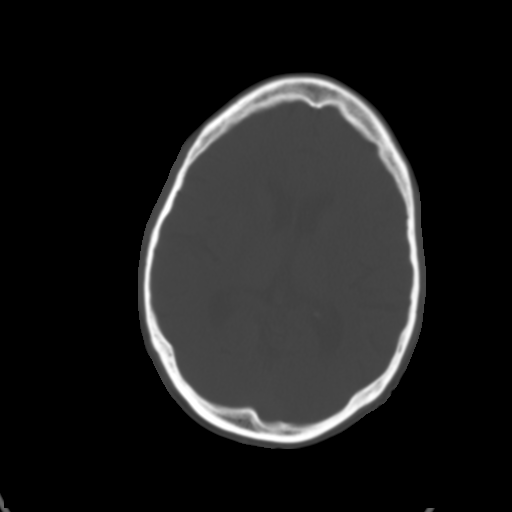
[im 19/31  brain]
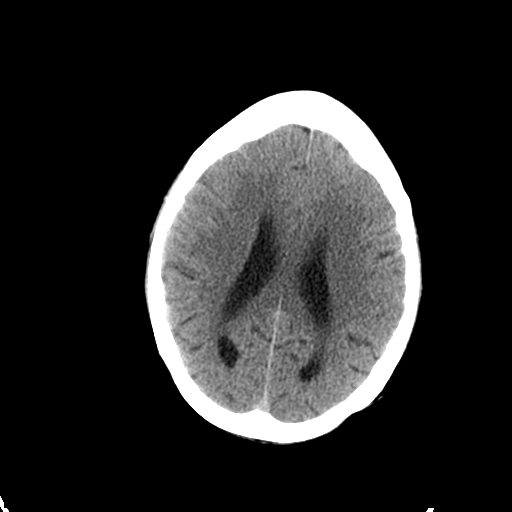
[im 22/31  brain]
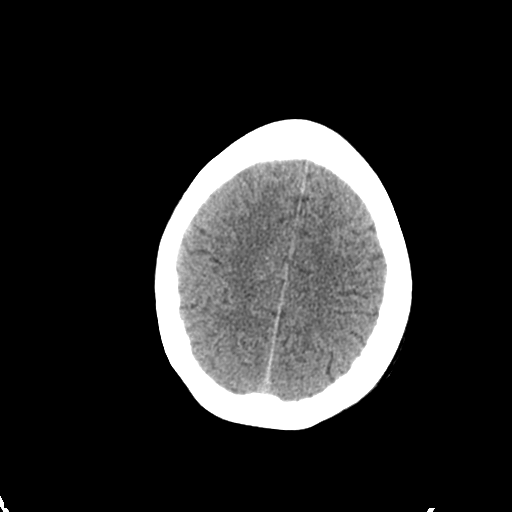
[im 25/31  brain]
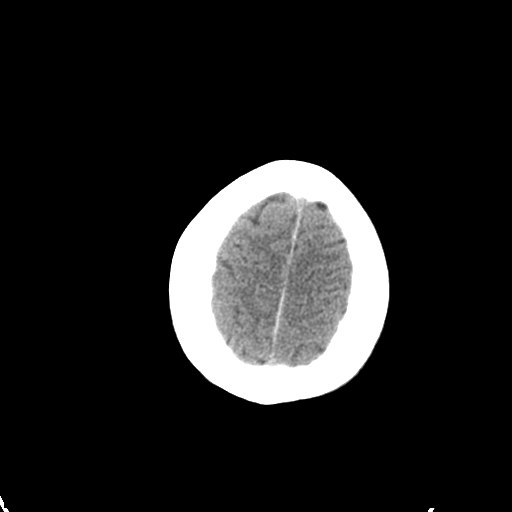
[im 28/31  brain]
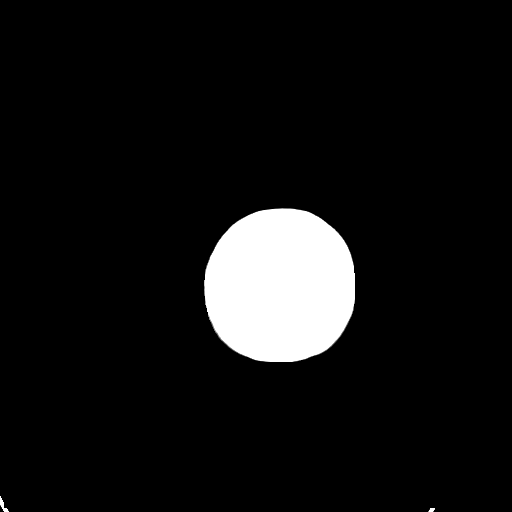
[im 28/31  bone]
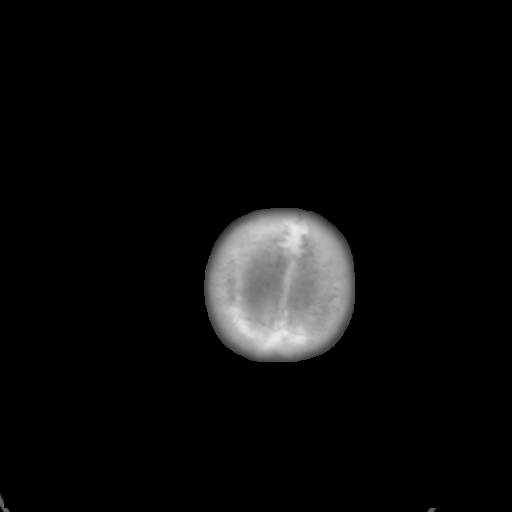

[Series 5: coronal soft tissue · coronal · 0.28mm/px · 3 of 62 slices shown]
[im 21/62  brain]
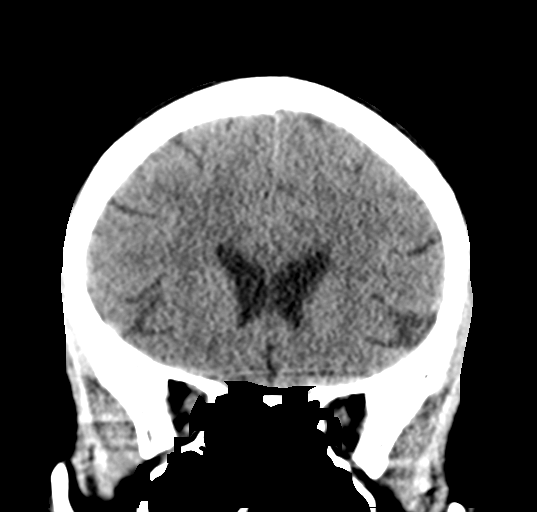
[im 28/62  brain]
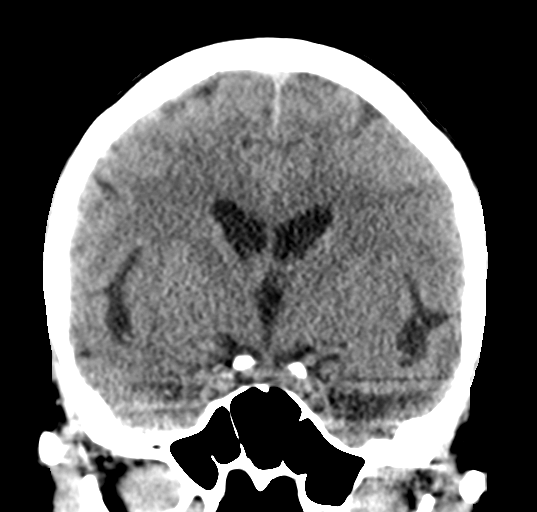
[im 34/62  brain]
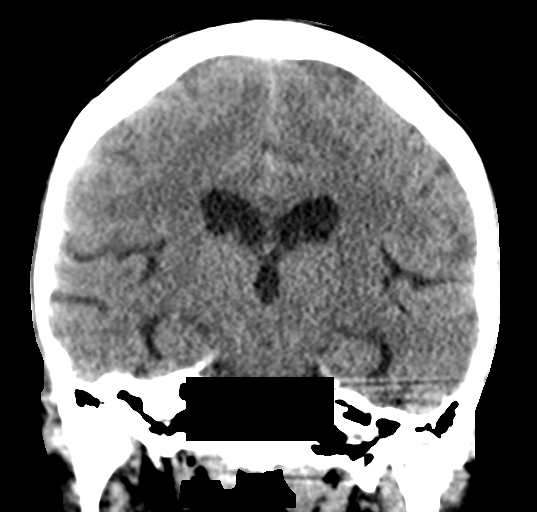

[Series 6: sagittal soft tissue · sagittal · 0.28mm/px · 3 of 50 slices shown]
[im 17/50  brain]
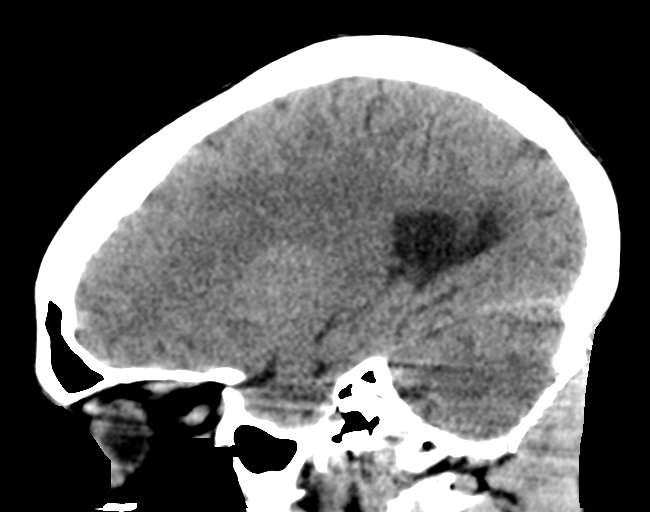
[im 25/50  brain]
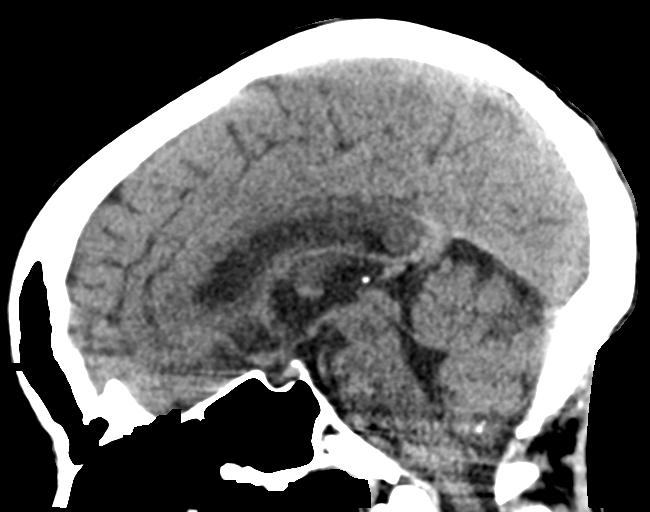
[im 33/50  brain]
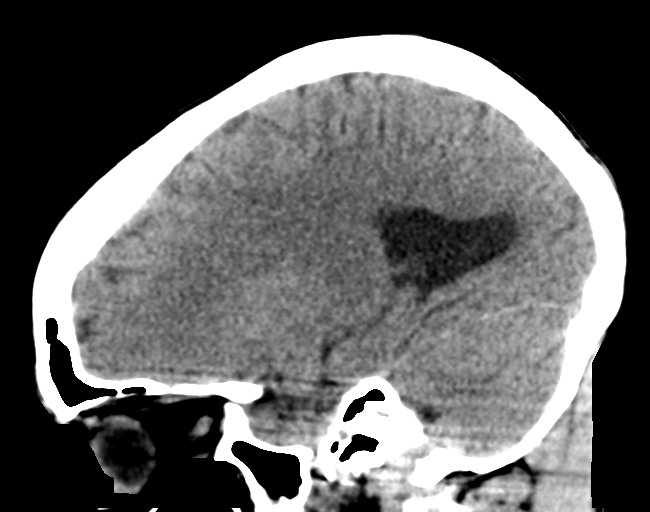

[15 of 47 positions shown; findings below may reference images not displayed]

FINDINGS: Evaluation of this exam is limited due to motion artifact.

Brain: The ventricles and sulci appropriate size for patient's age.
There is an area of low attenuation in the right cerebellar
hemisphere most consistent with edema and likely postsurgical. There
is no acute intracranial hemorrhage. No mass effect or midline
shift. No extra-axial fluid collection.

Vascular: No hyperdense vessel or unexpected calcification.

Skull: Right occipital craniectomy. No acute calvarial pathology.

Sinuses/Orbits: No acute finding.

Other: None
IMPRESSION: 1. No acute intracranial pathology.
2. Postsurgical changes of right occipital lobe.

## 2020-01-20 MED ORDER — HYDROMORPHONE HCL 1 MG/ML IJ SOLN
1.0000 mg | Freq: Once | INTRAMUSCULAR | Status: AC
  Administered 2020-01-20: 1 mg via INTRAVENOUS
  Filled 2020-01-20: qty 1

## 2020-01-20 NOTE — ED Notes (Addendum)
Pt placed on purewick at 60 mmHg. Pt unsteady at this time. Pt states that her "feet and legs hurt."

## 2020-01-20 NOTE — Telephone Encounter (Signed)
Pt healthcare coordinator called stating pt has not been able to walk for the past week. Not able to provide care for herself. Not sure if cancer as spread. Per Dr.Gudena, pt is to go to ED for further evaluation.

## 2020-01-20 NOTE — ED Provider Notes (Addendum)
[facility] DEPT Provider Note   CSN: [id number] Arrival date & time: 01/20/20  1807     History Chief Complaint  Patient presents with  . Numbness    Diane Rosario is a 44 y.o. female.  HPI Patient presentsWith weakness and numbness in lower extremities.  Also feels generally weak and states she hurts all over.  States however the weakness is primarily in the lower legs.  Does have some pain in her buttock area.  States it radiates down from there.  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  Had a cerebellar tumor removed and had been on radiation for it.  Had not had weakness in the legs before.  No known malignancy in her spine.  She is here with her "cancer coach".  Reportedly not doing well at home.  No loss of bladder or bowel control..   Past Medical History:  Diagnosis Date  . Anxiety   . Bipolar 1 disorder ([place])    pt reports bipolar, pt reports Auditory hallucinations  . COPD (chronic obstructive pulmonary disease) ([place])   . Depression   . Malignant neoplasm of upper-outer quadrant of left breast in female, estrogen receptor positive ([place]) 11/03/2016  . PTSD (post-traumatic stress disorder)    husband was murdered    Patient Active Problem List   Diagnosis Date Noted  . Brain metastases ([place]) 10/02/2019  . Neoplasm of brain causing mass effect on adjacent structures ([place]) 09/03/2019  . Acute lower UTI 09/03/2019  . Failure to thrive in adult 09/03/2019  . Chemotherapy-induced neuropathy ([place]) 11/21/2017  . Port-A-Cath in place 01/31/2017  . Cancer of overlapping sites of left female breast ([place]) 12/14/2016  . Genetic testing 12/07/2016  . Malignant neoplasm of upper-outer quadrant of left breast in female, estrogen receptor positive ([place]) 11/03/2016  . Posttraumatic stress disorder 09/06/2012  . Major depressive disorder, recurrent episode, severe, specified as with psychotic behavior  09/06/2012  . Cannabis abuse 09/06/2012  . Cocaine abuse ([place]) 09/06/2012    Past Surgical History:  Procedure Laterality Date  . EYE SURGERY     both eyes ; correct weak eye muscles   . MASTECTOMY MODIFIED RADICAL Left 12/14/2016   Procedure: LEFT MODIFIED RADICAL MASTECTOMY WITH ERAS PATHWAY;  Surgeon: [name], MD;  Location: [facility];  Service: General;  Laterality: Left;  . PORTACATH PLACEMENT Right 12/14/2016   Procedure: INSERTION PORT-A-CATH WITH ULTRASOUND;  Surgeon: [name], MD;  Location: [facility];  Service: General;  Laterality: Right;  . RETROSIGMOID CRANIECTOMY FOR TUMOR RESECTION Right 09/04/2019   Procedure: SUBOCCIPITAL  CRANIECTOMY FOR TUMOR RESECTION;  Surgeon: Earnie Larsson, MD;  Location: [facility];  Service: Neurosurgery;  Laterality: Right;     OB History   No obstetric history on file.     Family History  Problem Relation Age of Onset  . Hypertension Maternal Aunt   . Hypertension Maternal Grandmother   . Cirrhosis Father   . Cancer Father        unknown type    Social History   Tobacco Use  . Smoking status: Current Every Day Smoker    Packs/day: 0.25    Years: 10.00    Pack years: 2.50    Types: Cigarettes  . Smokeless tobacco: Never Used  Vaping Use  . Vaping Use: Never used  Substance Use Topics  . Alcohol use: Not Currently  . Drug use: Yes    Types: Marijuana, Cocaine, "Crack" cocaine  Home Medications Prior to Admission medications   Medication Sig Start Date End Date Taking? Authorizing Provider  ALPRAZolam Duanne Moron) 1 MG tablet Take 1 mg by mouth at bedtime as needed for anxiety.   Yes [provider]  ENSURE (ENSURE) Take 237 mLs by mouth 3 (three) times daily between meals.    Yes [provider]  dexamethasone (DECADRON) 1 MG tablet Take 1 tablet (1 mg total) by mouth 2 (two) times daily with a meal. Take 2 tablets (2 mg total) twice a day x1 day, then decrease to 1 mg twice a day x2 weeks. Patient not taking:  Reported on 01/20/2020 09/26/19   Magrinat, Virgie Dad, MD  HYDROcodone-acetaminophen (NORCO) 5-325 MG tablet Take 1 tablet by mouth every 6 (six) hours as needed for moderate pain. 10/16/19   Kyung Rudd, MD  tamoxifen (NOLVADEX) 20 MG tablet Take 1 tablet (20 mg total) by mouth 2 (two) times daily. Patient not taking: Reported on 09/23/2019 09/19/17   Nicholas Lose, MD    Allergies    Diona Fanti [aspirin]  Review of Systems   Review of Systems  Constitutional: Positive for fatigue. Negative for appetite change and fever.  HENT: Negative for congestion.   Respiratory: Negative for shortness of breath.   Gastrointestinal: Negative for abdominal pain.  Musculoskeletal: Positive for back pain.  Skin: Negative for rash.  Neurological: Positive for weakness, numbness and headaches.    Physical Exam Updated Vital Signs BP 135/84   Pulse (!) 57   Temp (!) 97.4 F (36.3 C) (Oral)   Resp 18   Ht 5\' 6"  (1.676 m)   Wt 38.8 kg   SpO2 97%   BMI 13.81 kg/m   Physical Exam Vitals and nursing note reviewed.  HENT:     Head:     Comments: Post surgical on posterior scalp Eyes:     Pupils: Pupils are equal, round, and reactive to light.  Cardiovascular:     Rate and Rhythm: Regular rhythm.  Pulmonary:     Breath sounds: Normal breath sounds.  Abdominal:     Tenderness: There is no abdominal tenderness.  Musculoskeletal:     Cervical back: Neck supple.     Comments: No deformity of upper or lower extremitys  Skin:    General: Skin is warm.     Capillary Refill: Capillary refill takes less than 2 seconds.  Neurological:     Mental Status: She is alert and oriented to person, place, and time.     Comments: Decreased strength in bilateral lower extremities. Subjectively decreased sensation. Perineal sensation grossly intact. Left upper extremity weaker compared to right. Reportedly chronic.     ED Results / Procedures / Treatments   Labs (all labs ordered are listed, but only abnormal  results are displayed) Labs Reviewed  COMPREHENSIVE METABOLIC PANEL - Abnormal; Notable for the following components:      Result Value   BUN 21 (*)    All other components within normal limits  CBG MONITORING, ED - Abnormal; Notable for the following components:   Glucose-Capillary 128 (*)    All other components within normal limits  RESPIRATORY PANEL BY RT PCR (FLU A&B, COVID)  CBC WITH DIFFERENTIAL/PLATELET  URINALYSIS, ROUTINE W REFLEX MICROSCOPIC  PREGNANCY, URINE    EKG None  Radiology CT Head Wo Contrast  Result Date: 01/20/2020 CLINICAL DATA:  44 year old female with leg weakness. History of breast cancer with metastasis to brain. EXAM: CT HEAD WITHOUT CONTRAST TECHNIQUE: Contiguous axial images were  obtained from the base of the skull through the vertex without intravenous contrast. COMPARISON:  Brain MRI dated 09/05/2019. FINDINGS: Evaluation of this exam is limited due to motion artifact. Brain: The ventricles and sulci appropriate size for patient's age. There is an area of low attenuation in the right cerebellar hemisphere most consistent with edema and likely postsurgical. There is no acute intracranial hemorrhage. No mass effect or midline shift. No extra-axial fluid collection. Vascular: No hyperdense vessel or unexpected calcification. Skull: Right occipital craniectomy. No acute calvarial pathology. Sinuses/Orbits: No acute finding. Other: None IMPRESSION: 1. No acute intracranial pathology. 2. Postsurgical changes of right occipital lobe. Electronically Signed   By: Anner Crete M.D.   On: 01/20/2020 20:28    Procedures Procedures (including critical care time)  Medications Ordered in ED Medications  HYDROmorphone (DILAUDID) injection 1 mg (1 mg Intravenous Given 01/20/20 2100)    ED Course  I have reviewed the triage vital signs and the nursing notes.  Pertinent labs & imaging results that were available during my care of the patient were reviewed by me  and considered in my medical decision making (see chart for details).    MDM Rules/Calculators/A&P                          Patient with known metastatic cancer.  Now with increased generalized weakness particularly in lower extremities.  States the give out.  States that there is pain in her buttocks going down.  On PET scan from 4 months ago there is no known metastatic disease in this location.  However does appear weaker on the lower extremities compared to the upper.  Does have some left arm weakness but states this is chronic.  Head CT stable.  I think patient would benefit from admission to the hospital for further work-up of the weakness, including likely MRI.  Will discuss with hospitalist.  Discussed with Dr. Hal Hope.  Thinks patient needs a more urgent MRI then can be done at [facility] since she cannot ambulate.  Recommends ER to ER transfer and then consulting hospitalist once patient arrives there an MRI can be ordered.  Will discuss with ER physician. Final Clinical Impression(s) / ED Diagnoses Final diagnoses:  Weakness  Metastatic malignant neoplasm, unspecified site [facility])    Rx / [place] Orders ED Discharge Orders    None       Davonna Belling, MD 01/20/20 2251    Davonna Belling, MD 01/20/20 2258

## 2020-01-20 NOTE — ED Triage Notes (Signed)
Patient here with complaint foot and leg numbness x1 week, affecting ability to walk. Patient with repeated falls, denies hitting head, denies taking a blood thinner.  Patient with history of brain tumor/breast cancer, finished radiation x4 weeks ago.

## 2020-01-20 NOTE — Assessment & Plan Note (Deleted)
12/14/2016: Left modified radical mastectomy: IDC grade 3, spans 5 cm and a 2.2 cm with extensive DCIS grade 3, lymphovascular invasion present, margins negative, 17/17 lymph nodes positive, ER 30%, PR 0%, HER-2 positive, Ki-67 20%, T2 N3a M0 stage IIIB AJCC 8  Patient has long-standing issues with bipolar disorder and substance abuse previously. CT CAP and Bone Scan 11/17/2016: No metastases 11 mm left lower lobe groundglass nodule probably infectious etiology   Treatment plan:  1. Adjuvant chemotherapy with TCH Perjeta every 3 weeks 6 followed by Herceptin Perjeta maintenance for1 year 3. Adjuvant radiation2/27/2019-08/01/2017 4. Followed by adjuvant antiestrogen therapy with tamoxifen  10 yearsstarted 08/21/2017 -------------------------------------------------------------------------   Brain metastases May 2021: Cerebellar mass status post resection breast primary ER/PR negative HER-2 positive with a Ki-67 of 75% Brain radiation: 10/31/2019-12/06/2019  Brain MRI 09/05/2019: Status post resection of right cerebellar lesion.  No abnormal enhancement identified. PET CT scan 10/01/2019: No findings of hypermetabolic metastatic disease.  Treatment plan: Based on no evidence of active metastatic disease, I discussed the pros and cons of systemic therapy versus observation.

## 2020-01-21 ENCOUNTER — Emergency Department (HOSPITAL_COMMUNITY): Payer: Medicaid Other

## 2020-01-21 ENCOUNTER — Inpatient Hospital Stay (HOSPITAL_COMMUNITY): Payer: Medicaid Other

## 2020-01-21 ENCOUNTER — Encounter (HOSPITAL_COMMUNITY): Payer: Self-pay | Admitting: Internal Medicine

## 2020-01-21 DIAGNOSIS — C7951 Secondary malignant neoplasm of bone: Secondary | ICD-10-CM | POA: Diagnosis present

## 2020-01-21 DIAGNOSIS — Z923 Personal history of irradiation: Secondary | ICD-10-CM | POA: Diagnosis not present

## 2020-01-21 DIAGNOSIS — Z9012 Acquired absence of left breast and nipple: Secondary | ICD-10-CM | POA: Diagnosis not present

## 2020-01-21 DIAGNOSIS — Z8249 Family history of ischemic heart disease and other diseases of the circulatory system: Secondary | ICD-10-CM | POA: Diagnosis not present

## 2020-01-21 DIAGNOSIS — Z809 Family history of malignant neoplasm, unspecified: Secondary | ICD-10-CM | POA: Diagnosis not present

## 2020-01-21 DIAGNOSIS — R531 Weakness: Secondary | ICD-10-CM

## 2020-01-21 DIAGNOSIS — C50412 Malignant neoplasm of upper-outer quadrant of left female breast: Secondary | ICD-10-CM

## 2020-01-21 DIAGNOSIS — F17210 Nicotine dependence, cigarettes, uncomplicated: Secondary | ICD-10-CM | POA: Diagnosis present

## 2020-01-21 DIAGNOSIS — F4310 Post-traumatic stress disorder, unspecified: Secondary | ICD-10-CM | POA: Diagnosis present

## 2020-01-21 DIAGNOSIS — F1510 Other stimulant abuse, uncomplicated: Secondary | ICD-10-CM | POA: Diagnosis present

## 2020-01-21 DIAGNOSIS — R29898 Other symptoms and signs involving the musculoskeletal system: Secondary | ICD-10-CM | POA: Diagnosis present

## 2020-01-21 DIAGNOSIS — C50812 Malignant neoplasm of overlapping sites of left female breast: Secondary | ICD-10-CM | POA: Diagnosis present

## 2020-01-21 DIAGNOSIS — R296 Repeated falls: Secondary | ICD-10-CM | POA: Diagnosis present

## 2020-01-21 DIAGNOSIS — F319 Bipolar disorder, unspecified: Secondary | ICD-10-CM | POA: Diagnosis present

## 2020-01-21 DIAGNOSIS — F1410 Cocaine abuse, uncomplicated: Secondary | ICD-10-CM | POA: Diagnosis present

## 2020-01-21 DIAGNOSIS — C7949 Secondary malignant neoplasm of other parts of nervous system: Secondary | ICD-10-CM | POA: Diagnosis present

## 2020-01-21 DIAGNOSIS — Y929 Unspecified place or not applicable: Secondary | ICD-10-CM | POA: Diagnosis not present

## 2020-01-21 DIAGNOSIS — Z20822 Contact with and (suspected) exposure to covid-19: Secondary | ICD-10-CM | POA: Diagnosis present

## 2020-01-21 DIAGNOSIS — C7931 Secondary malignant neoplasm of brain: Secondary | ICD-10-CM

## 2020-01-21 DIAGNOSIS — F419 Anxiety disorder, unspecified: Secondary | ICD-10-CM | POA: Diagnosis present

## 2020-01-21 DIAGNOSIS — G620 Drug-induced polyneuropathy: Secondary | ICD-10-CM | POA: Diagnosis present

## 2020-01-21 DIAGNOSIS — J449 Chronic obstructive pulmonary disease, unspecified: Secondary | ICD-10-CM | POA: Diagnosis present

## 2020-01-21 DIAGNOSIS — T451X5A Adverse effect of antineoplastic and immunosuppressive drugs, initial encounter: Secondary | ICD-10-CM | POA: Diagnosis present

## 2020-01-21 DIAGNOSIS — Z515 Encounter for palliative care: Secondary | ICD-10-CM | POA: Diagnosis not present

## 2020-01-21 DIAGNOSIS — R54 Age-related physical debility: Secondary | ICD-10-CM | POA: Diagnosis present

## 2020-01-21 DIAGNOSIS — F1210 Cannabis abuse, uncomplicated: Secondary | ICD-10-CM | POA: Diagnosis present

## 2020-01-21 DIAGNOSIS — Z170 Estrogen receptor positive status [ER+]: Secondary | ICD-10-CM

## 2020-01-21 DIAGNOSIS — Z9221 Personal history of antineoplastic chemotherapy: Secondary | ICD-10-CM | POA: Diagnosis not present

## 2020-01-21 DIAGNOSIS — Z66 Do not resuscitate: Secondary | ICD-10-CM | POA: Diagnosis present

## 2020-01-21 DIAGNOSIS — Z7189 Other specified counseling: Secondary | ICD-10-CM | POA: Diagnosis not present

## 2020-01-21 LAB — URINALYSIS, ROUTINE W REFLEX MICROSCOPIC
Glucose, UA: 100 mg/dL — AB
Ketones, ur: NEGATIVE mg/dL
Leukocytes,Ua: NEGATIVE
Nitrite: POSITIVE — AB
Protein, ur: 30 mg/dL — AB
Specific Gravity, Urine: >1.03 — ABNORMAL HIGH (ref 1.005–1.030)
pH: 6 (ref 5.0–8.0)

## 2020-01-21 LAB — URINALYSIS, MICROSCOPIC (REFLEX)

## 2020-01-21 LAB — PREGNANCY, URINE: Preg Test, Ur: NEGATIVE

## 2020-01-21 IMAGING — MR MR LUMBAR SPINE WO/W CM
4 of 7 series · 23 of 48 positions shown · IV contrast (gadavist)
Comparison: None.

CLINICAL DATA: Diffuse weakness

EXAM:
MRI CERVICAL, THORACIC AND LUMBAR SPINE WITHOUT AND WITH CONTRAST
TECHNIQUE: Multiplanar and multiecho pulse sequences of the cervical spine, to
include the craniocervical junction and cervicothoracic junction,
and thoracic and lumbar spine, were obtained without and with
intravenous contrast.
CONTRAST:  4mL GADAVIST GADOBUTROL 1 MMOL/ML IV SOLN

[Series 5: T2 · sagittal · 4.0mm · 0.73mm/px · 5 of 17 slices shown (1 of 2)]
[im 1/17]
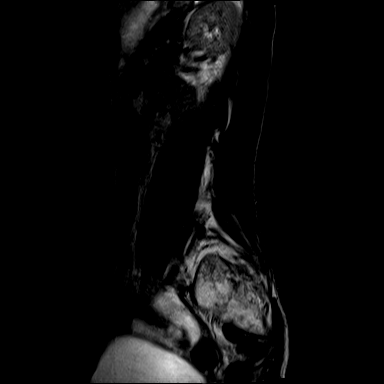
[im 5/17]
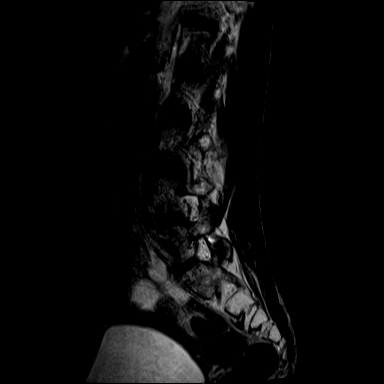
[im 9/17]
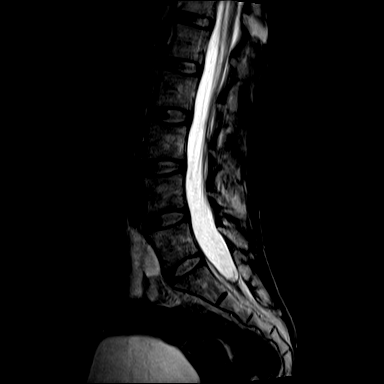
[im 13/17]
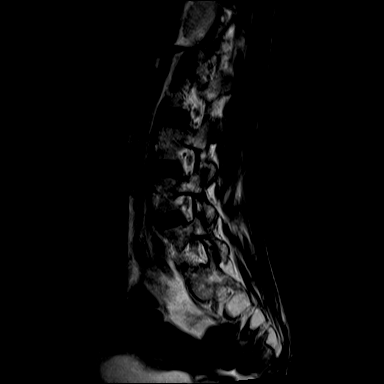
[im 17/17]
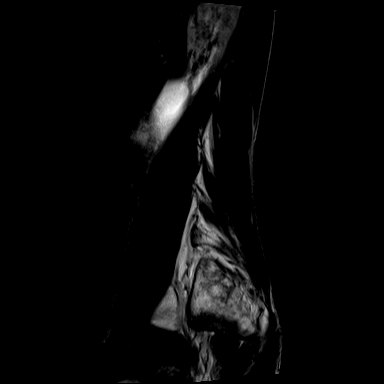

[Series 7: T1 · sagittal · 4.0mm · 0.88mm/px · 4 of 17 slices shown (1 of 2)]
[im 1/17]
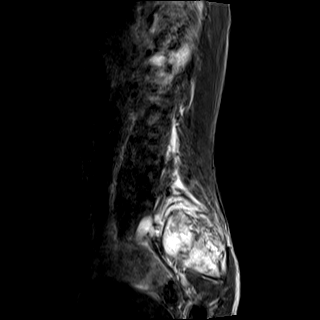
[im 6/17]
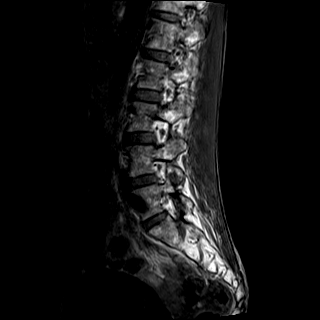
[im 11/17]
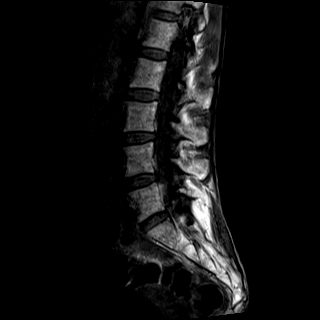
[im 17/17]
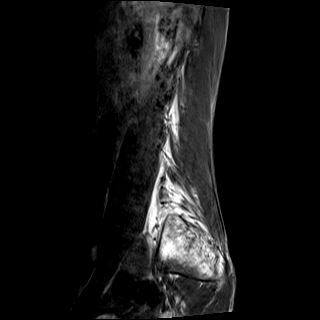

[Series 8: T2 · axial · 4.5mm · 0.57mm/px · z∈[-565,-324]mm · 8 of 39 slices shown (2 of 2)]
[im 1/39]
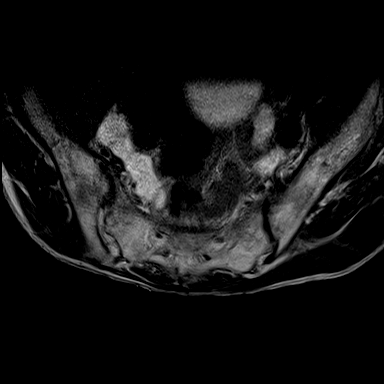
[im 5/39]
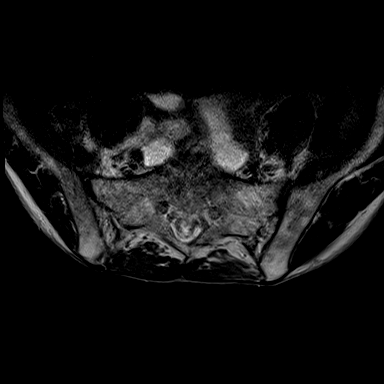
[im 13/39]
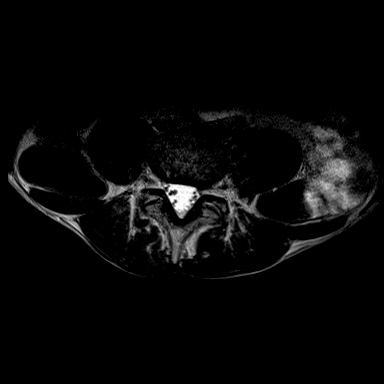
[im 17/39]
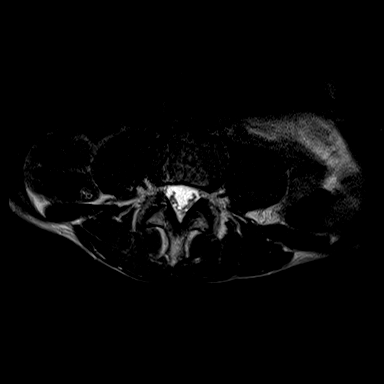
[im 22/39]
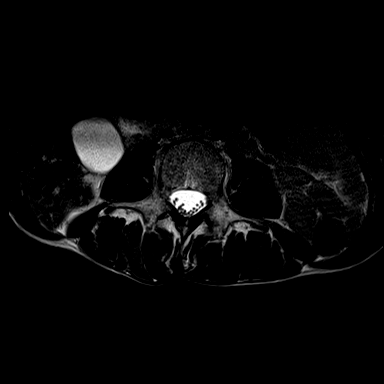
[im 26/39]
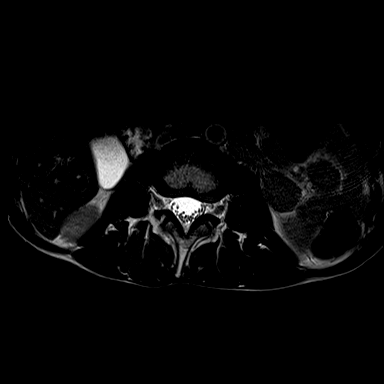
[im 34/39]
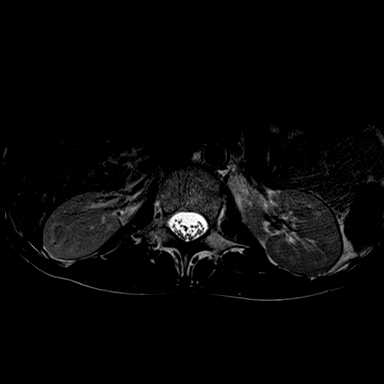
[im 39/39]
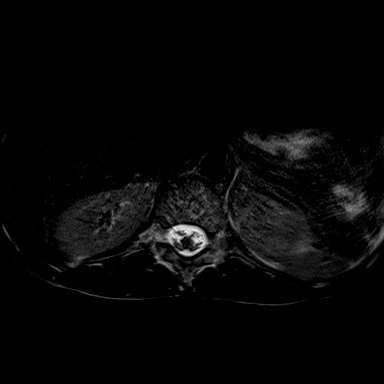

[Series 9: T1 · axial · 4.5mm · 0.34mm/px · z∈[-565,-352]mm · 6 of 39 slices shown (2 of 2)]
[im 1/39]
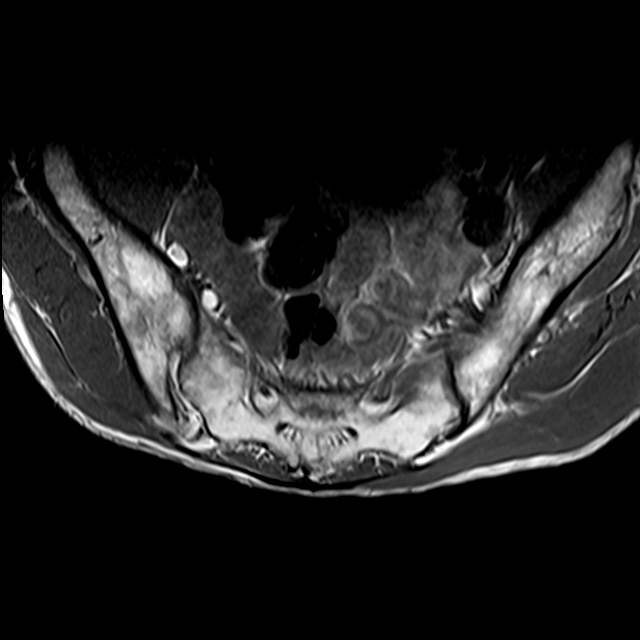
[im 5/39]
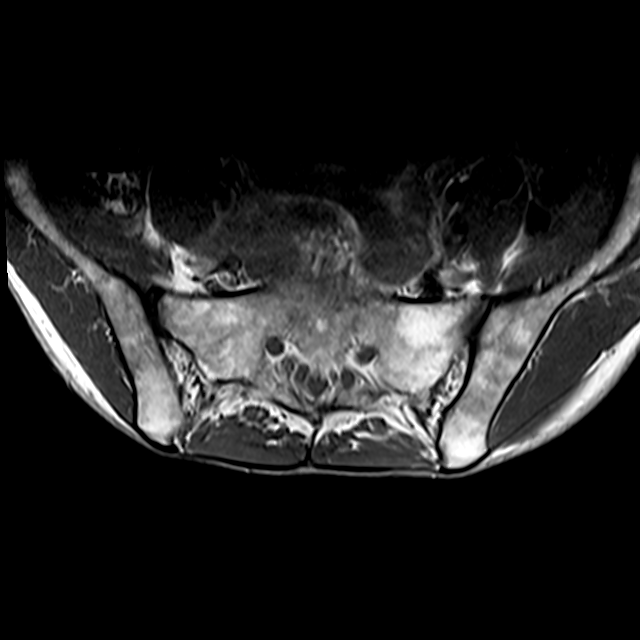
[im 13/39]
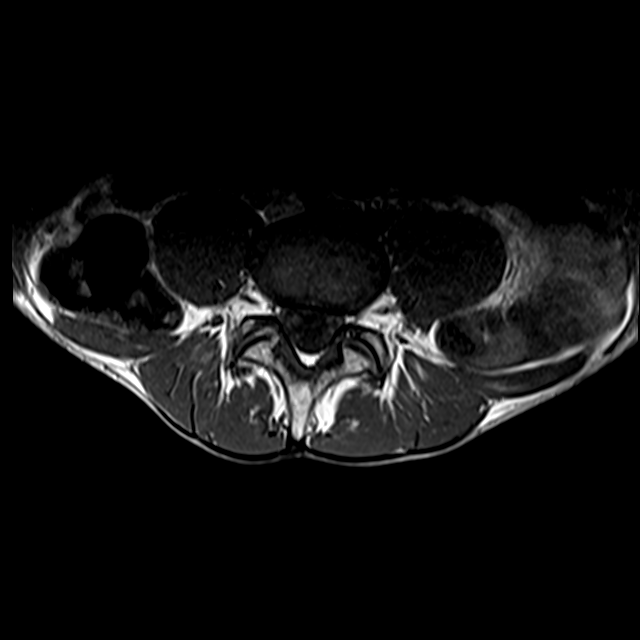
[im 17/39]
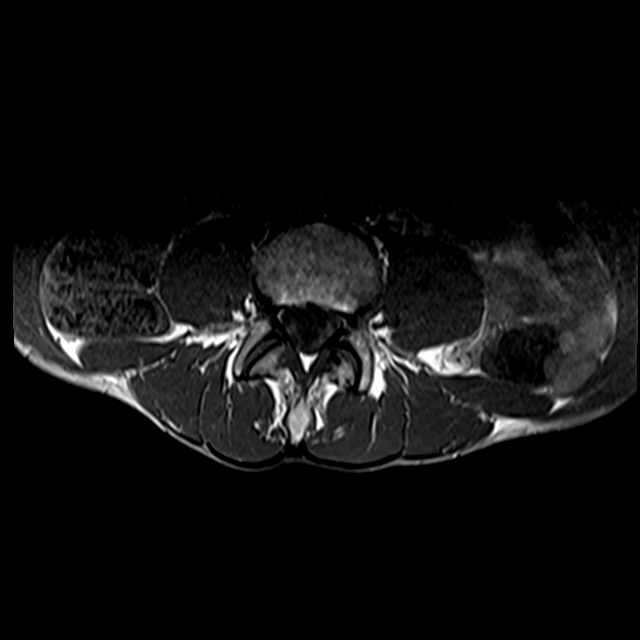
[im 22/39]
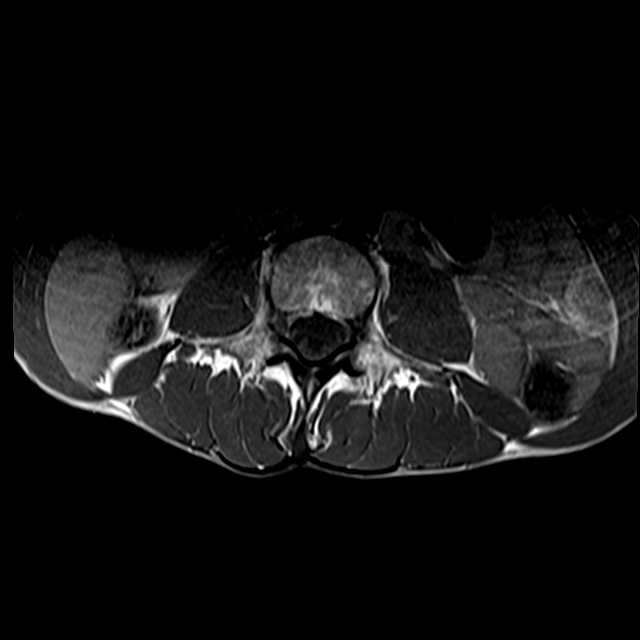
[im 34/39]
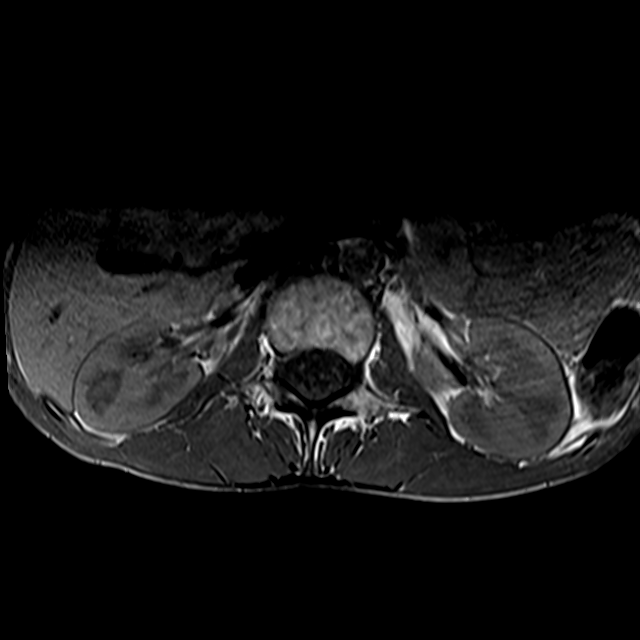

[23 of 48 positions shown; findings below may reference images not displayed]

FINDINGS: MRI CERVICAL SPINE FINDINGS

Alignment: Physiologic.

Vertebrae: No fracture, evidence of discitis, or bone lesion.

Cord: There is diffuse leptomeningeal contrast enhancement along the
surface of the cervical spinal cord. Additionally, there is
intraparenchymal signal abnormality at the C4-7 levels.

Posterior Fossa, vertebral arteries, paraspinal tissues:
Incompletely visualized right cerebellar tumor with extension along
the right leaflet of the tentorium cerebelli.

Disc levels:

No significant disc herniation or spinal canal stenosis.

MRI THORACIC SPINE FINDINGS

Alignment:  Physiologic.

Vertebrae: No fracture, evidence of discitis, or bone lesion.

Cord: Diffuse leptomeningeal contrast enhancement primarily along
the dorsal surface of the thoracic spinal cord. There is
intraparenchymal hyperintense T2-weighted signal that extends from
the cervicothoracic junction to the T10 level.

Paraspinal and other soft tissues: Negative

Disc levels:

No disc herniation or spinal canal stenosis.

MRI LUMBAR SPINE FINDINGS

Segmentation:  Standard.

Alignment:  Physiologic.

Vertebrae:  No fracture, evidence of discitis, or bone lesion.

Conus medullaris and cauda equina: Conus extends to the L1 level.
There is clumping of the cauda equina nerve roots with multifocal
abnormal contrast enhancement.

Paraspinal and other soft tissues: Negative

Disc levels:

No disc herniation, spinal canal stenosis or neural foraminal
stenosis.
IMPRESSION: 1. Diffuse leptomeningeal contrast enhancement along the surface of
the cervical and thoracic spinal cord and the cauda equina nerve
roots. The findings are most consistent with leptomeningeal
metastatic disease.
2. Intraparenchymal signal abnormality within the cervical and
thoracic spinal cord is likely reactive edema due to the
leptomeningeal disease.
3. Incompletely visualized right cerebellar tumor with extension
along the right leaflet of the tentorium cerebelli. MRI of the brain
with without contrast should be considered for more complete
characterization.

## 2020-01-21 IMAGING — MR MR THORACIC SPINE WO/W CM
5 of 9 series · 23 of 48 positions shown · IV contrast (gadavist)
Comparison: None.

CLINICAL DATA: Diffuse weakness

EXAM:
MRI CERVICAL, THORACIC AND LUMBAR SPINE WITHOUT AND WITH CONTRAST
TECHNIQUE: Multiplanar and multiecho pulse sequences of the cervical spine, to
include the craniocervical junction and cervicothoracic junction,
and thoracic and lumbar spine, were obtained without and with
intravenous contrast.
CONTRAST:  4mL GADAVIST GADOBUTROL 1 MMOL/ML IV SOLN

[Series 18: T1 · sagittal · 6.0mm · 1.23mm/px · 1 of 8 slices shown (1 of 3)]
[im 1/8]
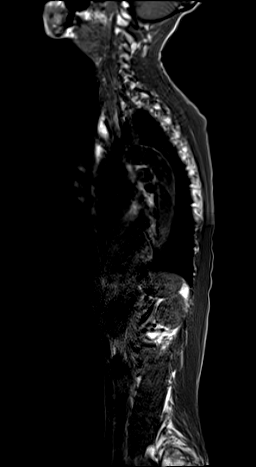

[Series 19: T2 · sagittal · 3.0mm · 0.76mm/px · 3 of 17 slices shown (1 of 2)]
[im 1/17]
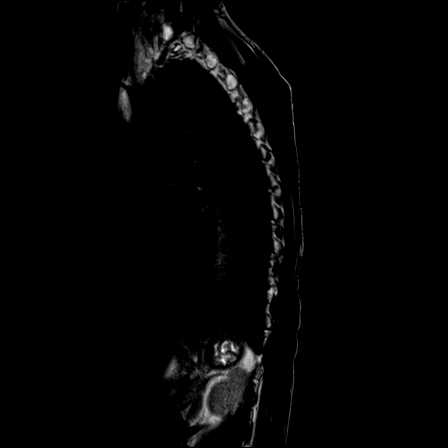
[im 9/17]
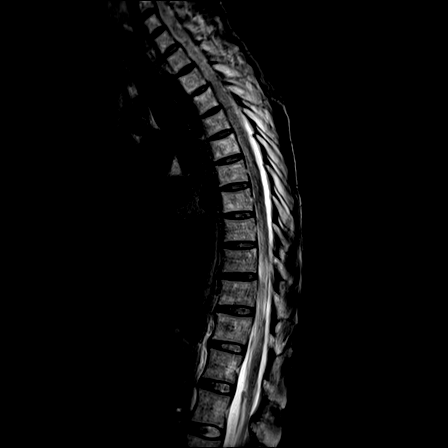
[im 17/17]
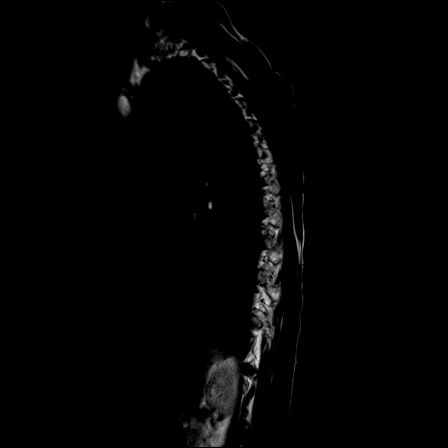

[Series 20: T1 · sagittal · 3.0mm · 0.76mm/px · 4 of 17 slices shown (2 of 3)]
[im 1/17]
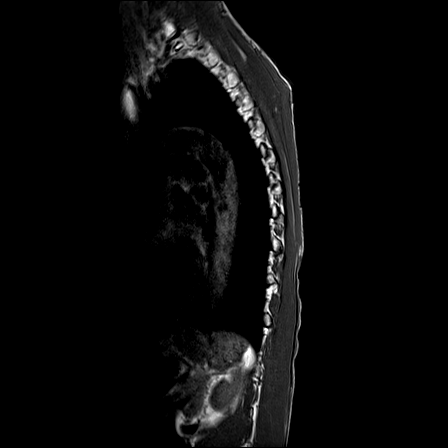
[im 6/17]
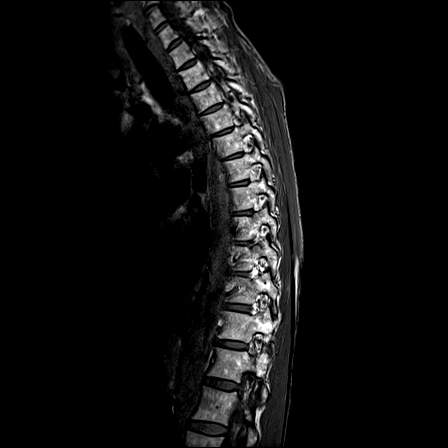
[im 11/17]
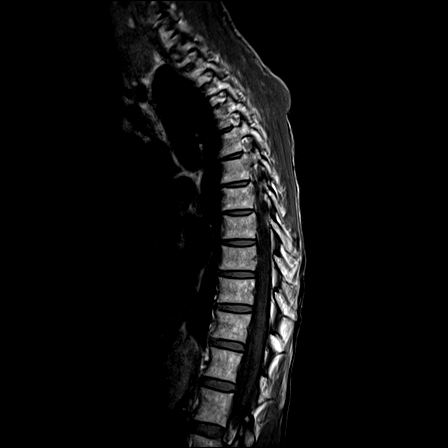
[im 17/17]
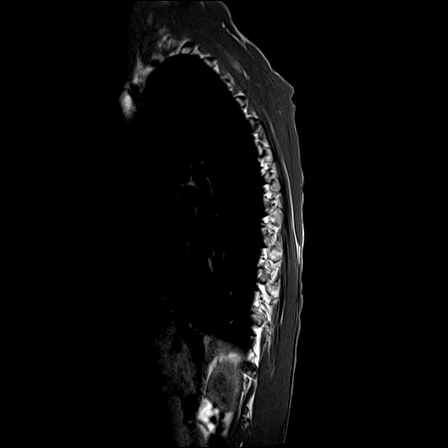

[Series 22: T2 · axial · 4.0mm · 0.59mm/px · z∈[-317,-106]mm · 8 of 39 slices shown (2 of 2)]
[im 1/39]
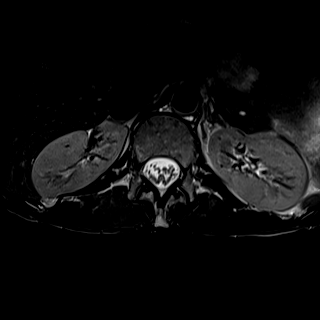
[im 6/39]
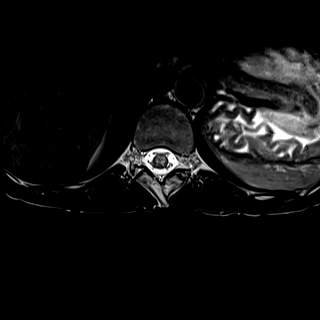
[im 11/39]
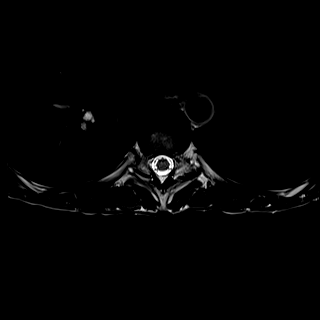
[im 17/39]
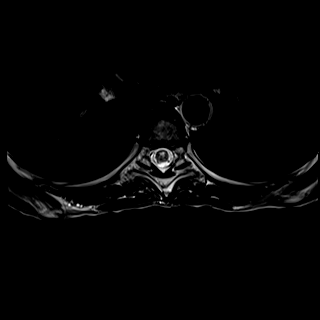
[im 22/39]
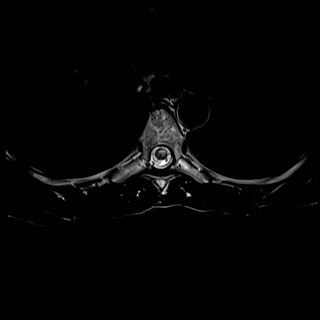
[im 28/39]
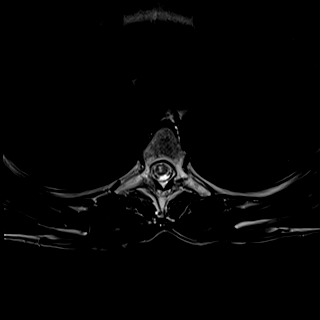
[im 33/39]
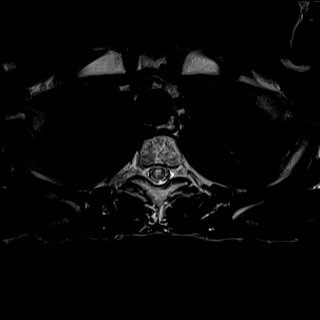
[im 39/39]
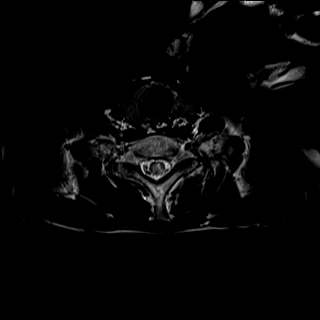

[Series 24: T1 · axial · non-contrast · 4.0mm · 0.31mm/px · z∈[-317,-132]mm · 7 of 39 slices shown (3 of 3)]
[im 1/39]
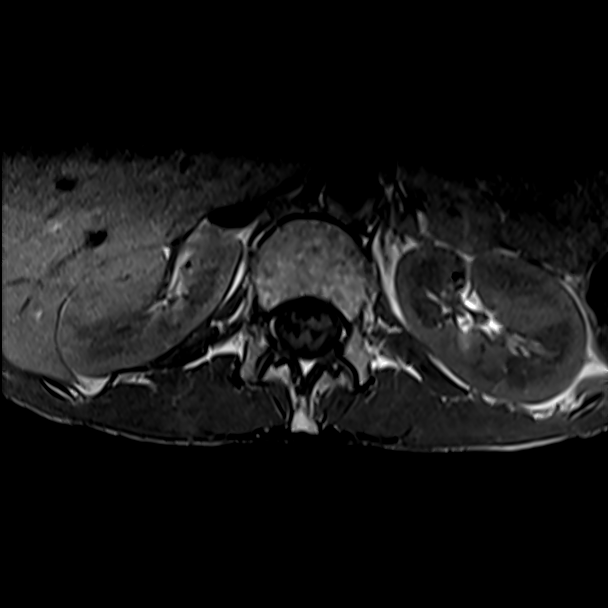
[im 6/39]
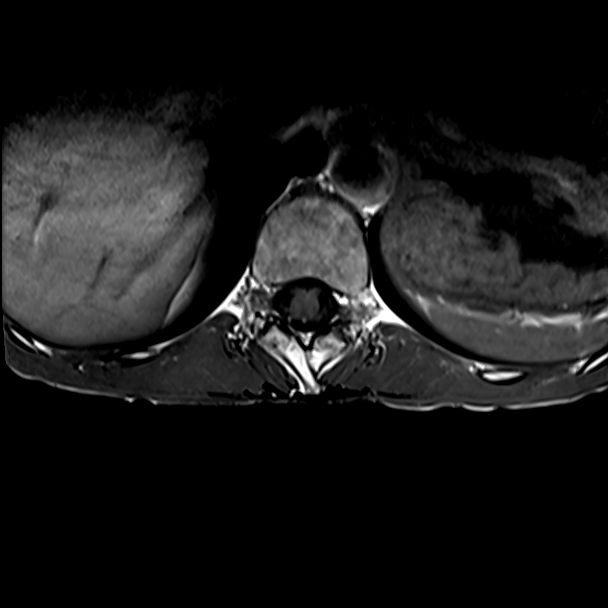
[im 11/39]
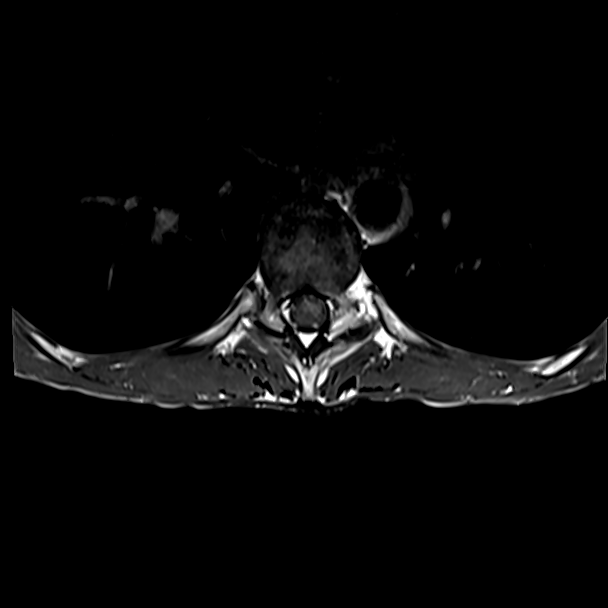
[im 17/39]
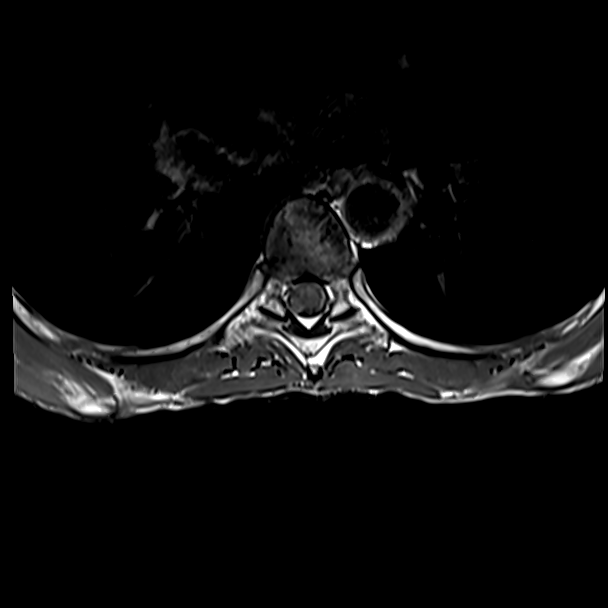
[im 22/39]
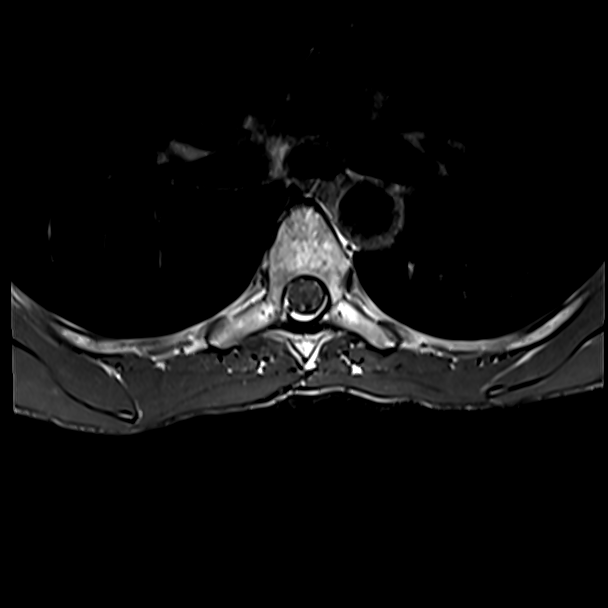
[im 28/39]
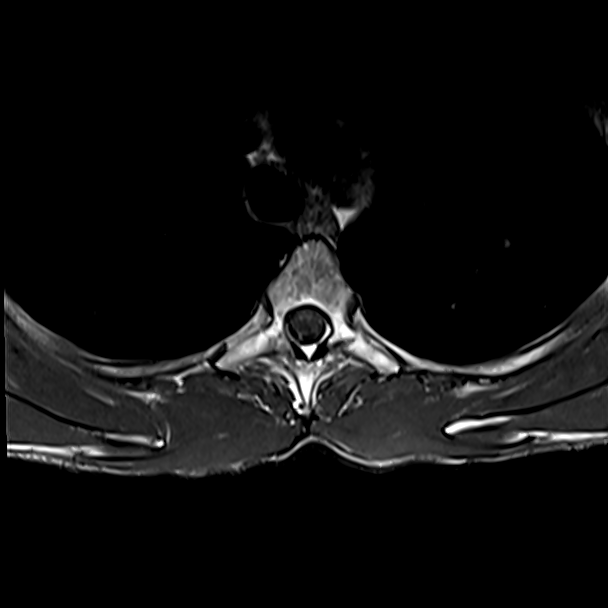
[im 33/39]
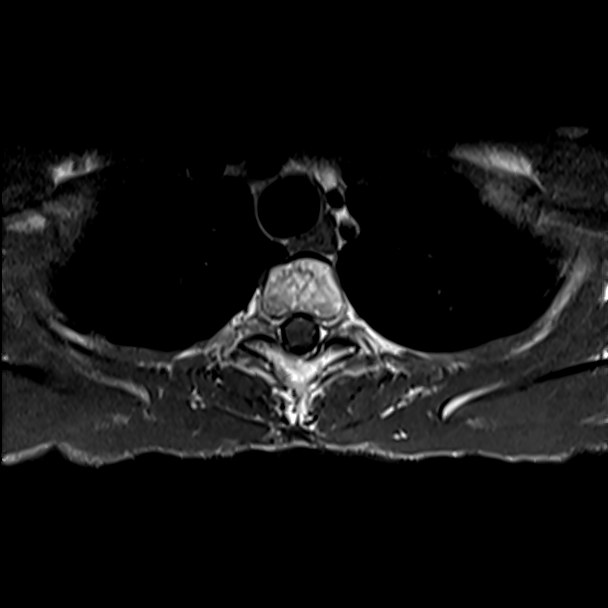

[23 of 48 positions shown; findings below may reference images not displayed]

FINDINGS: MRI CERVICAL SPINE FINDINGS

Alignment: Physiologic.

Vertebrae: No fracture, evidence of discitis, or bone lesion.

Cord: There is diffuse leptomeningeal contrast enhancement along the
surface of the cervical spinal cord. Additionally, there is
intraparenchymal signal abnormality at the C4-7 levels.

Posterior Fossa, vertebral arteries, paraspinal tissues:
Incompletely visualized right cerebellar tumor with extension along
the right leaflet of the tentorium cerebelli.

Disc levels:

No significant disc herniation or spinal canal stenosis.

MRI THORACIC SPINE FINDINGS

Alignment:  Physiologic.

Vertebrae: No fracture, evidence of discitis, or bone lesion.

Cord: Diffuse leptomeningeal contrast enhancement primarily along
the dorsal surface of the thoracic spinal cord. There is
intraparenchymal hyperintense T2-weighted signal that extends from
the cervicothoracic junction to the T10 level.

Paraspinal and other soft tissues: Negative

Disc levels:

No disc herniation or spinal canal stenosis.

MRI LUMBAR SPINE FINDINGS

Segmentation:  Standard.

Alignment:  Physiologic.

Vertebrae:  No fracture, evidence of discitis, or bone lesion.

Conus medullaris and cauda equina: Conus extends to the L1 level.
There is clumping of the cauda equina nerve roots with multifocal
abnormal contrast enhancement.

Paraspinal and other soft tissues: Negative

Disc levels:

No disc herniation, spinal canal stenosis or neural foraminal
stenosis.
IMPRESSION: 1. Diffuse leptomeningeal contrast enhancement along the surface of
the cervical and thoracic spinal cord and the cauda equina nerve
roots. The findings are most consistent with leptomeningeal
metastatic disease.
2. Intraparenchymal signal abnormality within the cervical and
thoracic spinal cord is likely reactive edema due to the
leptomeningeal disease.
3. Incompletely visualized right cerebellar tumor with extension
along the right leaflet of the tentorium cerebelli. MRI of the brain
with without contrast should be considered for more complete
characterization.

## 2020-01-21 IMAGING — MR MR HEAD WO/W CM
4 of 13 series · 13 of 48 positions shown · IV contrast (3 g)
Comparison: Head CT [DATE] and MRI [DATE]

CLINICAL DATA: Generalized weakness with new inability to walk.
Recent falls. History of metastatic breast cancer.

EXAM:
MRI HEAD WITHOUT AND WITH CONTRAST
TECHNIQUE: Multiplanar, multiecho pulse sequences of the brain and surrounding
structures were obtained without and with intravenous contrast.
CONTRAST:  3mL GADAVIST GADOBUTROL 1 MMOL/ML IV SOLN

[Series 2: DWI · axial · 3.0mm · 0.94mm/px · z∈[-130,+11]mm · 7 of 96 slices shown]
[im 1/96]
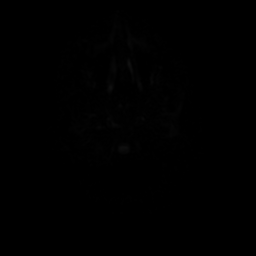
[im 16/96]
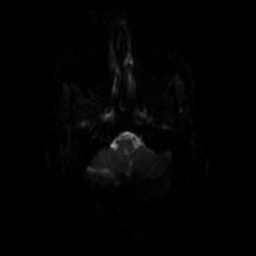
[im 32/96]
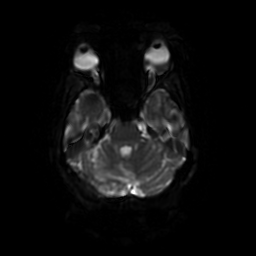
[im 48/96]
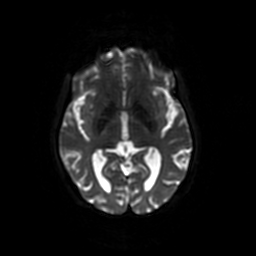
[im 64/96]
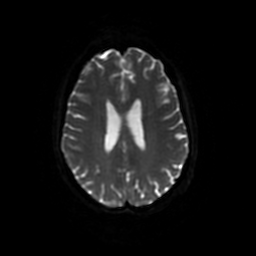
[im 80/96]
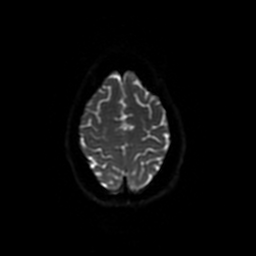
[im 96/96]
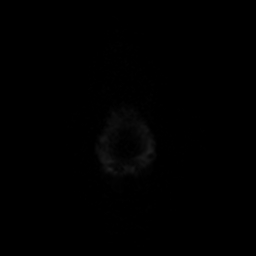

[Series 4: FLAIR · sagittal · 5.0mm · 0.23mm/px · 2 of 23 slices shown (1 of 3)]
[im 1/23]
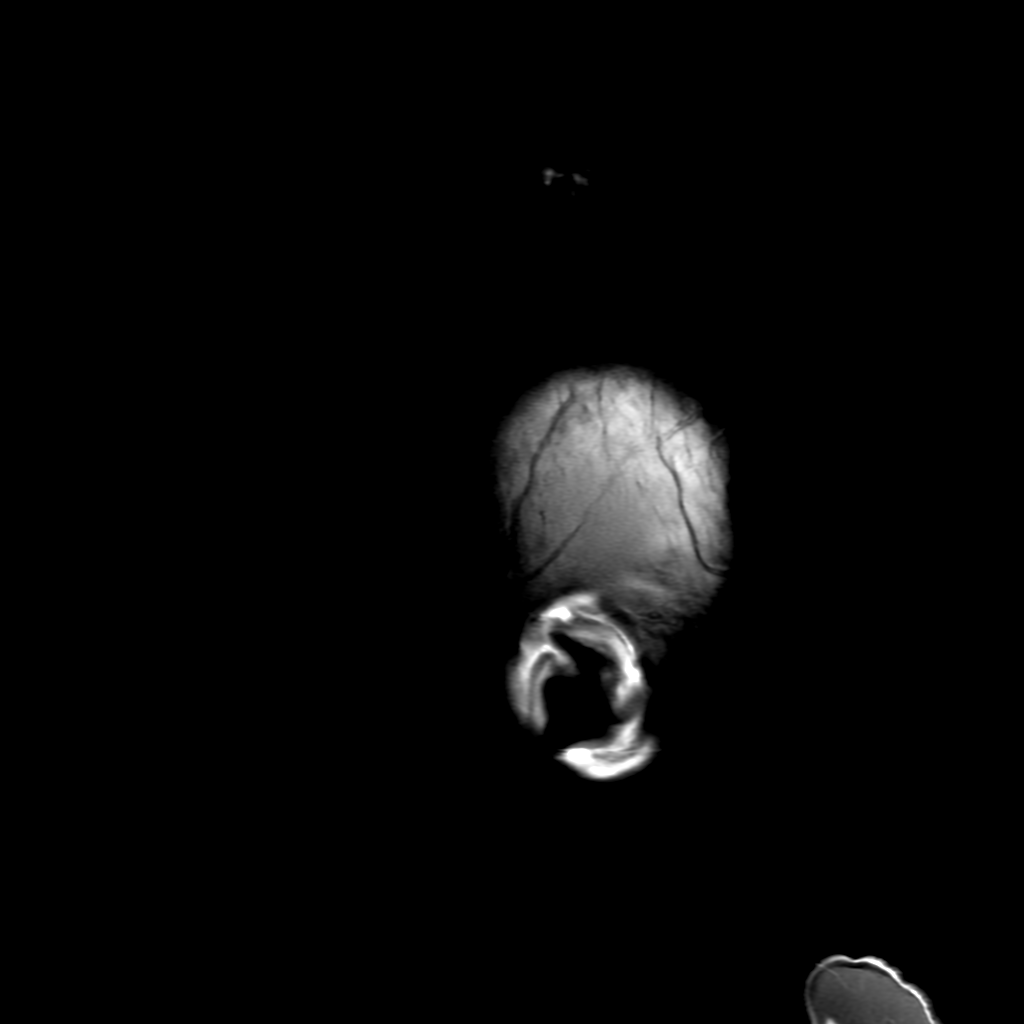
[im 23/23]
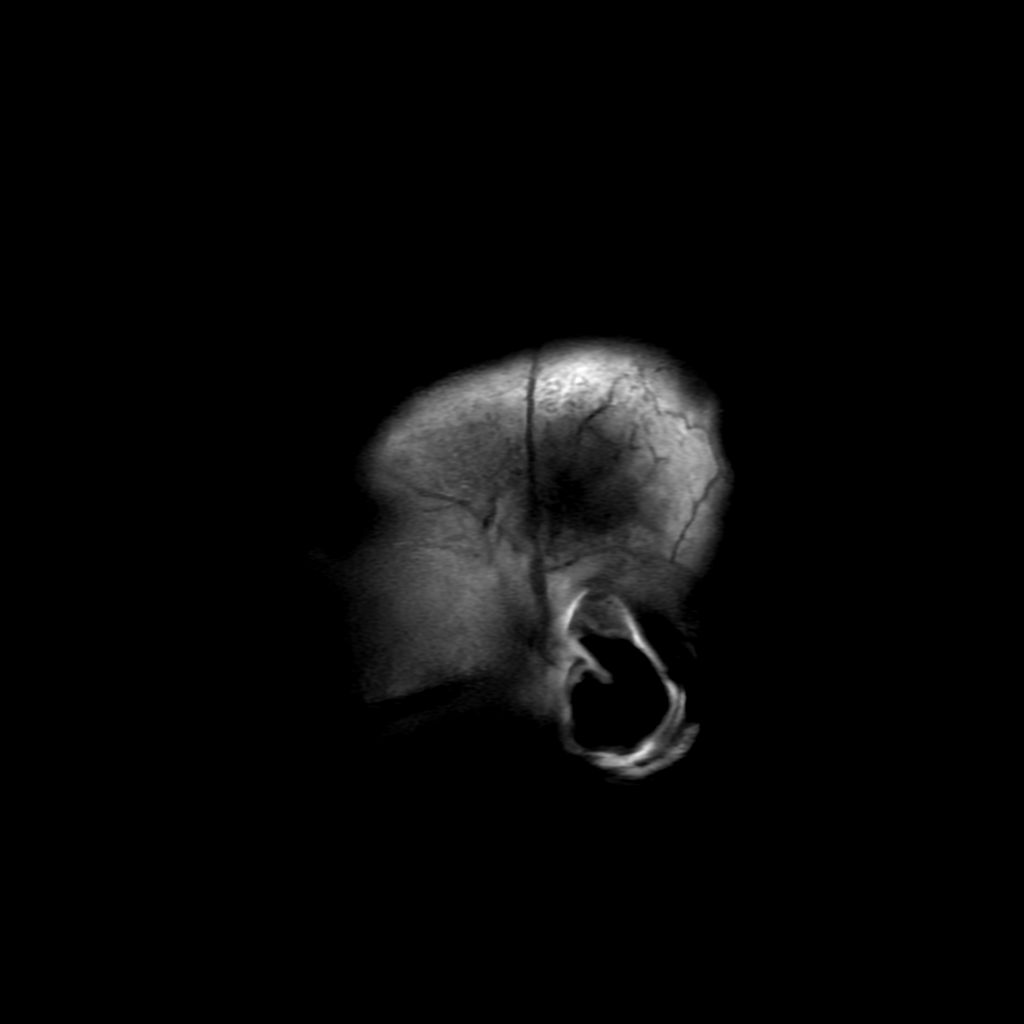

[Series 6: FLAIR · axial · 3.0mm · 0.41mm/px · z∈[-116,+15]mm · 2 of 23 slices shown (2 of 3)]
[im 1/23]
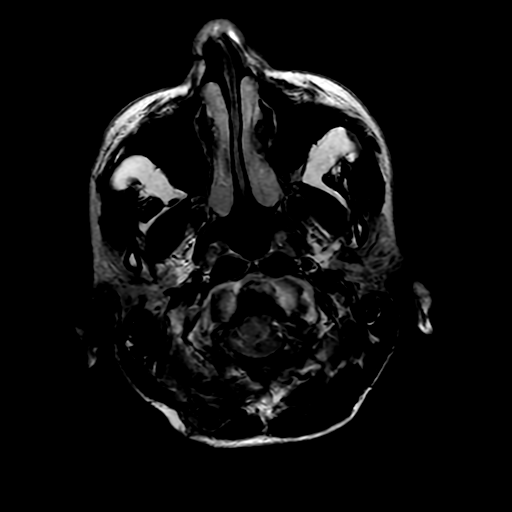
[im 23/23]
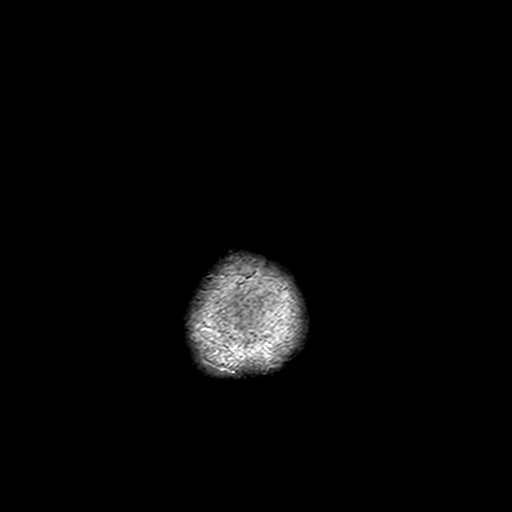

[Series 12: FLAIR · sagittal · 5.0mm · 0.23mm/px · 2 of 23 slices shown (3 of 3)]
[im 1/23]
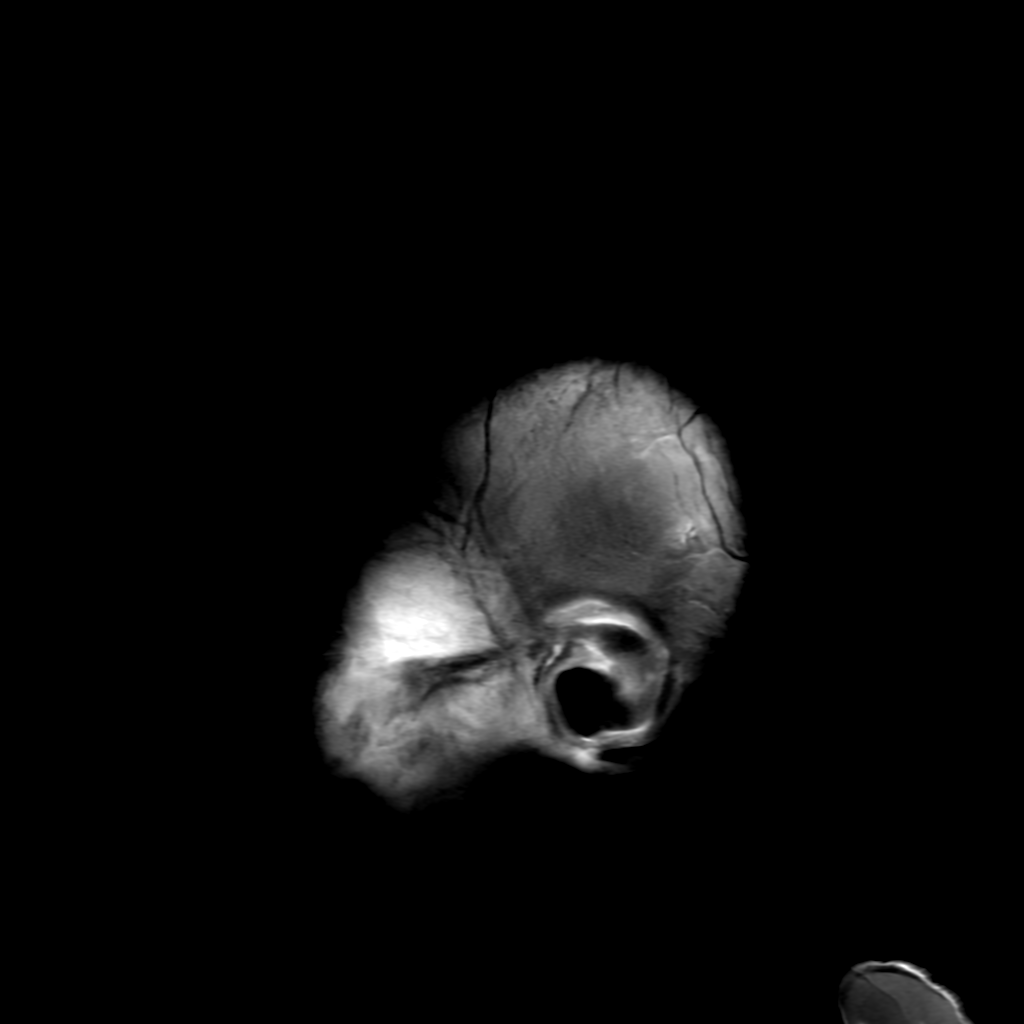
[im 23/23]
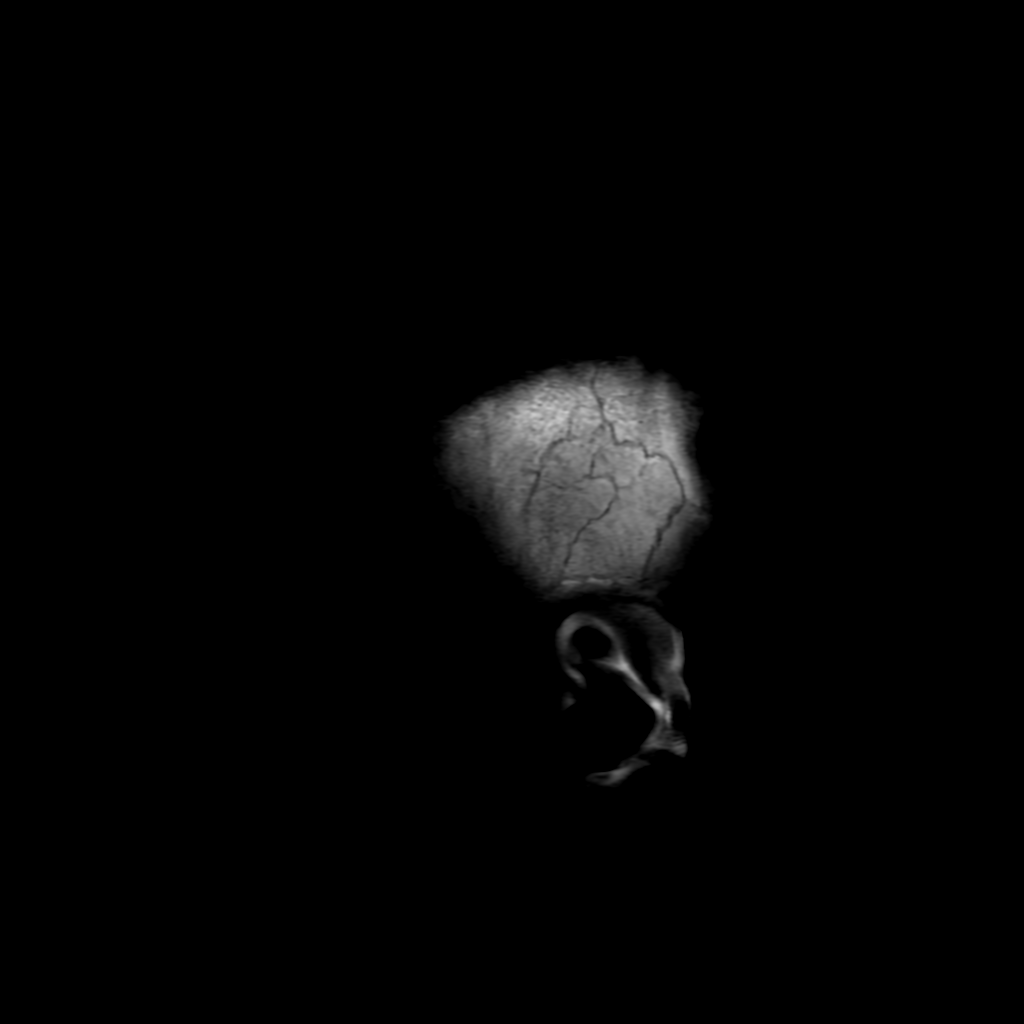

[13 of 48 positions shown; findings below may reference images not displayed]

FINDINGS: Brain: Sequelae of suboccipital craniectomy are again identified for
resection of a right cerebellar metastasis. A heterogeneously
enhancing mass at the resection site along the posterior margin of
the right cerebellar hemisphere measures 3.7 x 1.5 x 2.1 cm. This
mass is contiguous with nodular dural thickening of the right
tentorium, and there is milder thickening of the left tentorium.
There is also partly nodular enhancement along the inferior
cerebellar folia on the right. There is moderate right cerebellar
edema. The fourth ventricle remains patent. Hydrocephalus has
improved from the prior MRI, with the lateral and third ventricles
now only slightly dilated and with resolution of transependymal
edema. There is FLAIR hyperintensity within medial parieto-occipital
sulci bilaterally with possible faint enhancement which may reflect
leptomeningeal disease, and there is a small amount of nodular
enhancement anteriorly in the left middle cranial fossa likely
reflecting leptomeningeal disease. No acute infarct, intracranial
hemorrhage, midline shift, or sizable extra-axial fluid collection
is identified. There is mild cerebral atrophy.

Vascular: Major intracranial vascular flow voids are preserved.

Skull and upper cervical spine: Unremarkable bone marrow signal.

Sinuses/Orbits: Unremarkable orbits. Minimal mucosal thickening in
the left maxillary sinus. Mild mucosal edema/trace fluid in the
mastoid air cells, right greater than left.

Other: None.
IMPRESSION: Recurrent tumor at the right cerebellar resection site with evidence
of leptomeningeal disease in the posterior fossa and supratentorial
brain.

## 2020-01-21 IMAGING — MR MR CERVICAL SPINE WO/W CM
7 of 9 series · 32 of 48 positions shown · IV contrast (gadavist)
Comparison: None.

CLINICAL DATA: Diffuse weakness

EXAM:
MRI CERVICAL, THORACIC AND LUMBAR SPINE WITHOUT AND WITH CONTRAST
TECHNIQUE: Multiplanar and multiecho pulse sequences of the cervical spine, to
include the craniocervical junction and cervicothoracic junction,
and thoracic and lumbar spine, were obtained without and with
intravenous contrast.
CONTRAST:  4mL GADAVIST GADOBUTROL 1 MMOL/ML IV SOLN

[Series 9: T2 · sagittal · 3.0mm · 0.69mm/px · 4 of 15 slices shown (1 of 3)]
[im 1/15]
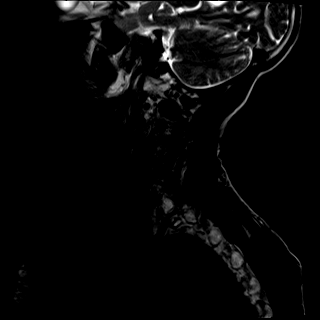
[im 5/15]
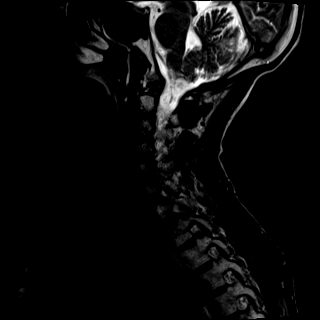
[im 10/15]
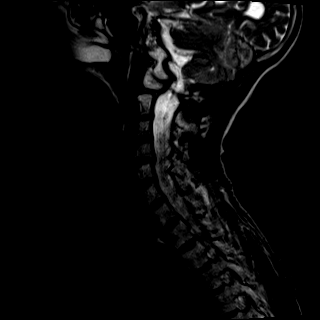
[im 15/15]
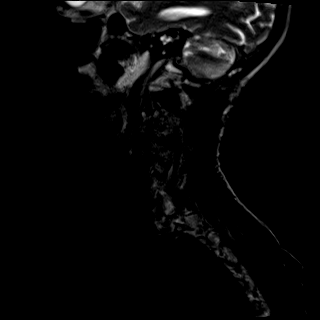

[Series 10: T1 · sagittal · 3.0mm · 0.69mm/px · 3 of 15 slices shown (1 of 2)]
[im 1/15]
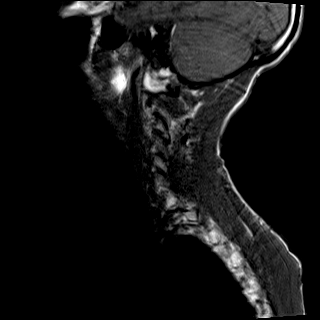
[im 8/15]
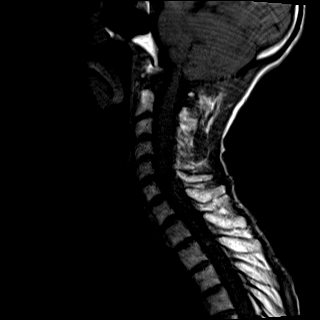
[im 15/15]
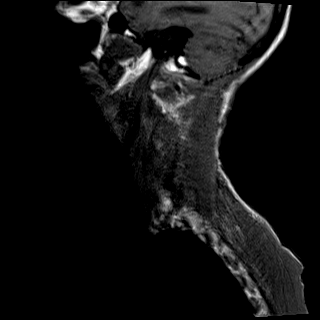

[Series 11: STIR · sagittal · 3.0mm · 0.86mm/px · 1 of 15 slices shown]
[im 1/15]
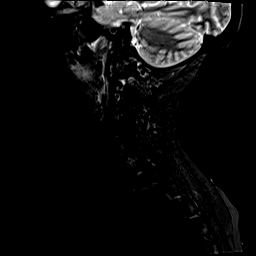

[Series 12: T2 · axial · 3.0mm · 0.66mm/px · z∈[-115,+10]mm · 7 of 40 slices shown (2 of 3)]
[im 1/40]
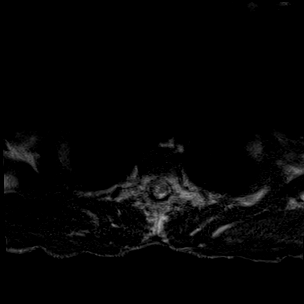
[im 7/40]
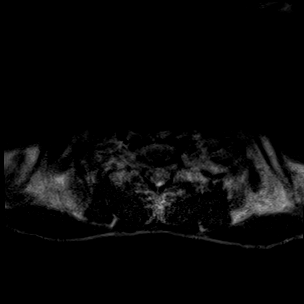
[im 14/40]
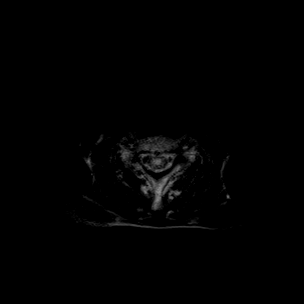
[im 20/40]
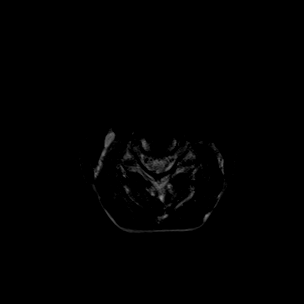
[im 27/40]
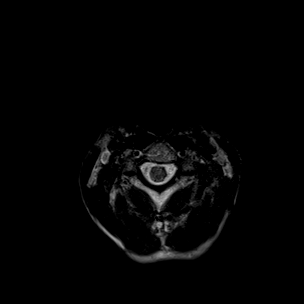
[im 33/40]
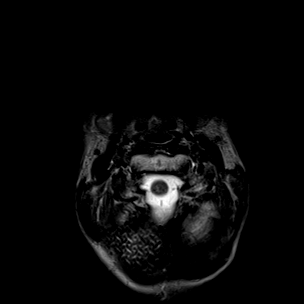
[im 40/40]
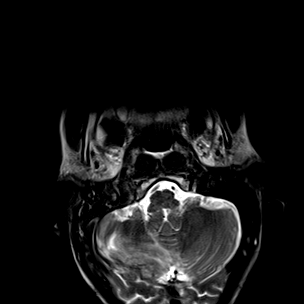

[Series 14: T2 · axial · 3.0mm · 0.66mm/px · z∈[-115,+10]mm · 7 of 40 slices shown (3 of 3)]
[im 1/40]
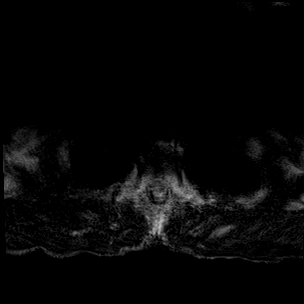
[im 7/40]
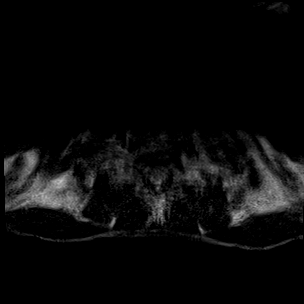
[im 14/40]
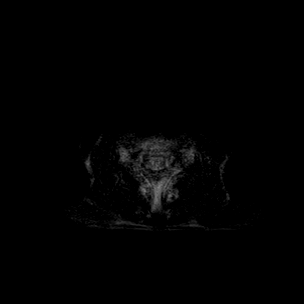
[im 20/40]
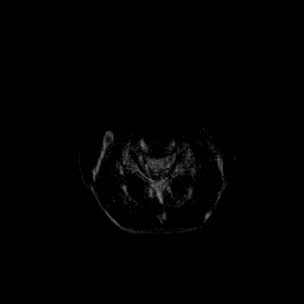
[im 27/40]
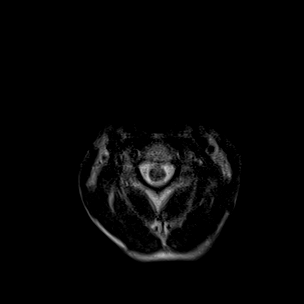
[im 33/40]
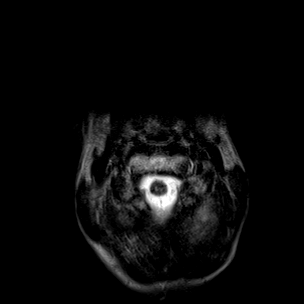
[im 40/40]
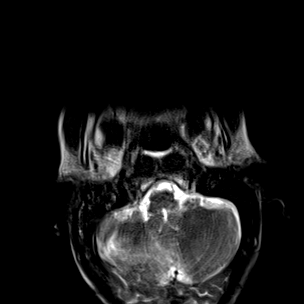

[Series 15: T1 · axial · 3.0mm · 0.39mm/px · z∈[-115,+10]mm · 7 of 40 slices shown (2 of 2)]
[im 1/40]
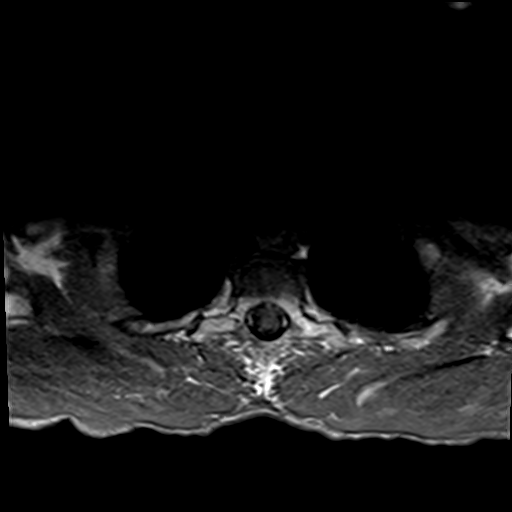
[im 7/40]
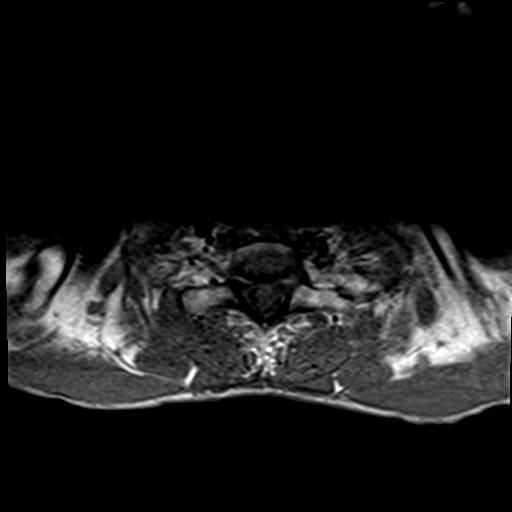
[im 14/40]
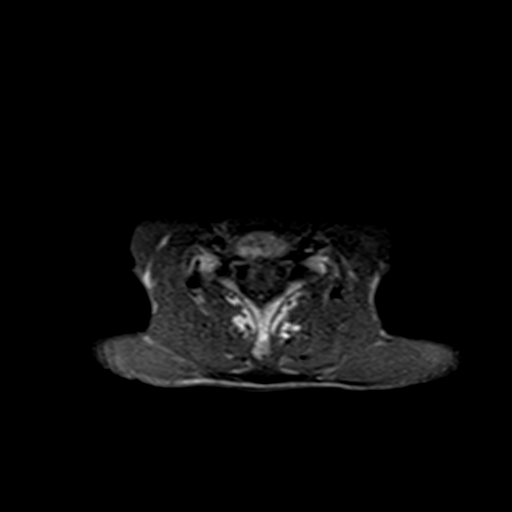
[im 20/40]
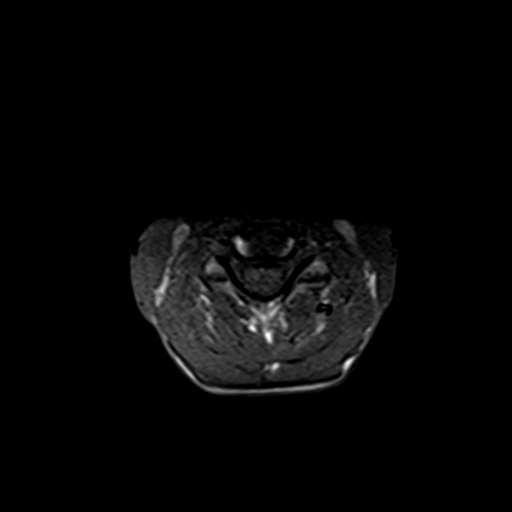
[im 27/40]
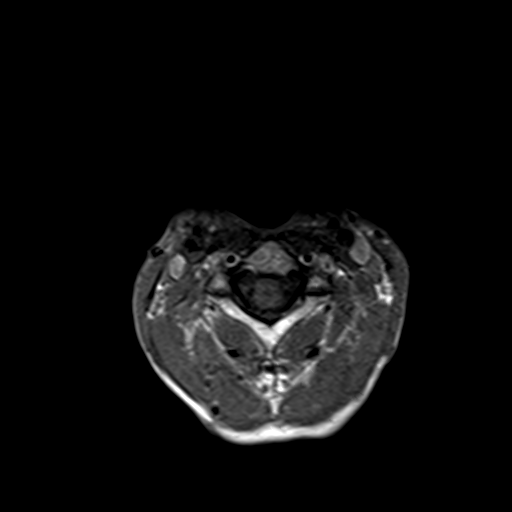
[im 33/40]
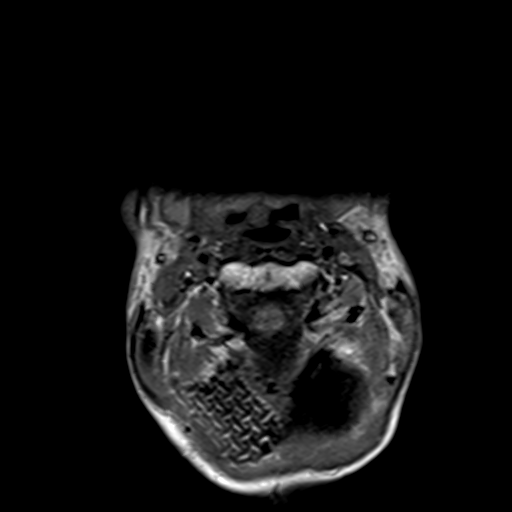
[im 40/40]
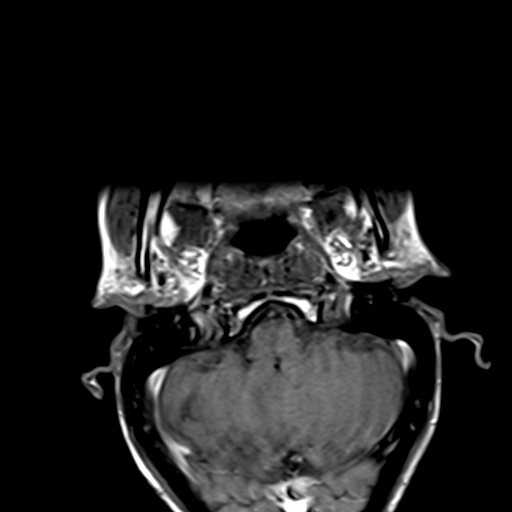

[Series 20: T1 fat-sat post-contrast · sagittal · 3.0mm · 0.43mm/px · 3 of 15 slices shown]
[im 1/15]
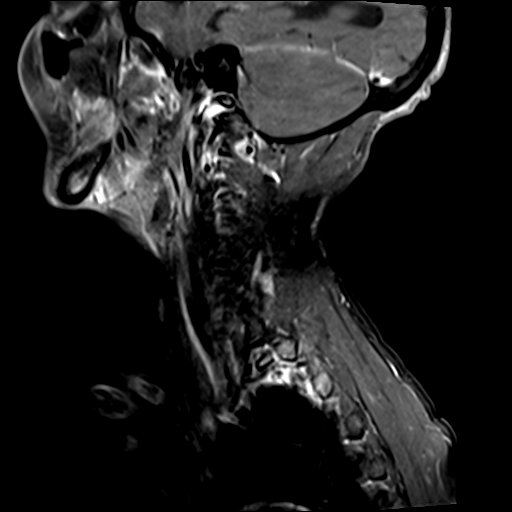
[im 8/15]
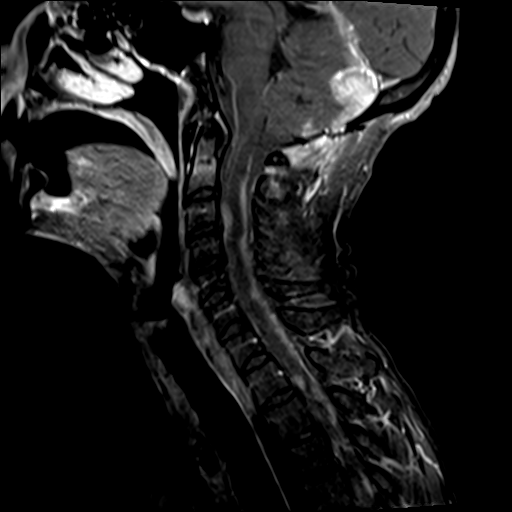
[im 15/15]
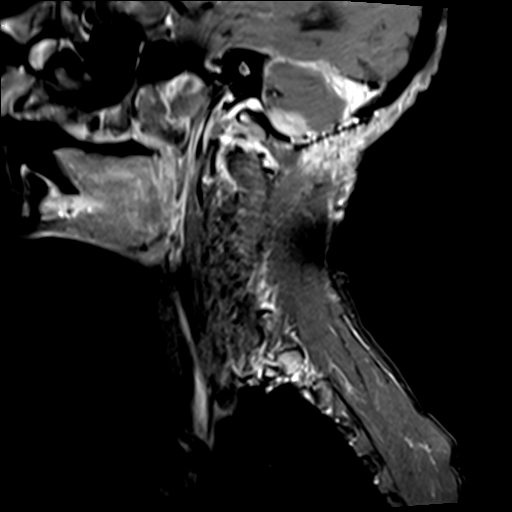

[32 of 48 positions shown; findings below may reference images not displayed]

FINDINGS: MRI CERVICAL SPINE FINDINGS

Alignment: Physiologic.

Vertebrae: No fracture, evidence of discitis, or bone lesion.

Cord: There is diffuse leptomeningeal contrast enhancement along the
surface of the cervical spinal cord. Additionally, there is
intraparenchymal signal abnormality at the C4-7 levels.

Posterior Fossa, vertebral arteries, paraspinal tissues:
Incompletely visualized right cerebellar tumor with extension along
the right leaflet of the tentorium cerebelli.

Disc levels:

No significant disc herniation or spinal canal stenosis.

MRI THORACIC SPINE FINDINGS

Alignment:  Physiologic.

Vertebrae: No fracture, evidence of discitis, or bone lesion.

Cord: Diffuse leptomeningeal contrast enhancement primarily along
the dorsal surface of the thoracic spinal cord. There is
intraparenchymal hyperintense T2-weighted signal that extends from
the cervicothoracic junction to the T10 level.

Paraspinal and other soft tissues: Negative

Disc levels:

No disc herniation or spinal canal stenosis.

MRI LUMBAR SPINE FINDINGS

Segmentation:  Standard.

Alignment:  Physiologic.

Vertebrae:  No fracture, evidence of discitis, or bone lesion.

Conus medullaris and cauda equina: Conus extends to the L1 level.
There is clumping of the cauda equina nerve roots with multifocal
abnormal contrast enhancement.

Paraspinal and other soft tissues: Negative

Disc levels:

No disc herniation, spinal canal stenosis or neural foraminal
stenosis.
IMPRESSION: 1. Diffuse leptomeningeal contrast enhancement along the surface of
the cervical and thoracic spinal cord and the cauda equina nerve
roots. The findings are most consistent with leptomeningeal
metastatic disease.
2. Intraparenchymal signal abnormality within the cervical and
thoracic spinal cord is likely reactive edema due to the
leptomeningeal disease.
3. Incompletely visualized right cerebellar tumor with extension
along the right leaflet of the tentorium cerebelli. MRI of the brain
with without contrast should be considered for more complete
characterization.

## 2020-01-21 MED ORDER — ONDANSETRON HCL 4 MG/2ML IJ SOLN: 4.0000 mg | Freq: Four times a day (QID) | INTRAMUSCULAR | Status: DC | PRN

## 2020-01-21 MED ORDER — PANTOPRAZOLE SODIUM 40 MG IV SOLR
40.0000 mg | Freq: Two times a day (BID) | INTRAVENOUS | Status: DC
  Administered 2020-01-21 – 2020-01-22 (×3): 40 mg via INTRAVENOUS
  Filled 2020-01-21 (×3): qty 40

## 2020-01-21 MED ORDER — GADOBUTROL 1 MMOL/ML IV SOLN
3.0000 mL | Freq: Once | INTRAVENOUS | Status: AC | PRN
  Administered 2020-01-21: 3 mL via INTRAVENOUS

## 2020-01-21 MED ORDER — HYDROCODONE-ACETAMINOPHEN 5-325 MG PO TABS
1.0000 | ORAL_TABLET | Freq: Four times a day (QID) | ORAL | Status: DC | PRN
  Administered 2020-01-21 – 2020-01-23 (×4): 1 via ORAL
  Filled 2020-01-21 (×4): qty 1

## 2020-01-21 MED ORDER — ACETAMINOPHEN 325 MG PO TABS: 650.0000 mg | ORAL_TABLET | Freq: Four times a day (QID) | ORAL | Status: DC | PRN

## 2020-01-21 MED ORDER — DEXAMETHASONE SODIUM PHOSPHATE 4 MG/ML IJ SOLN
4.0000 mg | Freq: Four times a day (QID) | INTRAMUSCULAR | Status: DC
  Administered 2020-01-21 – 2020-01-23 (×10): 4 mg via INTRAVENOUS
  Filled 2020-01-21 (×10): qty 1

## 2020-01-21 MED ORDER — SODIUM CHLORIDE 0.9 % IV SOLN: INTRAVENOUS | Status: DC

## 2020-01-21 MED ORDER — ACETAMINOPHEN 650 MG RE SUPP: 650.0000 mg | Freq: Four times a day (QID) | RECTAL | Status: DC | PRN

## 2020-01-21 MED ORDER — HYDROMORPHONE HCL 1 MG/ML IJ SOLN: 1.0000 mg | INTRAMUSCULAR | Status: DC | PRN

## 2020-01-21 MED ORDER — GADOBUTROL 1 MMOL/ML IV SOLN
4.0000 mL | Freq: Once | INTRAVENOUS | Status: AC | PRN
  Administered 2020-01-21: 4 mL via INTRAVENOUS

## 2020-01-21 MED ORDER — HYDROCODONE-ACETAMINOPHEN 5-325 MG PO TABS
1.0000 | ORAL_TABLET | Freq: Once | ORAL | Status: AC
  Administered 2020-01-21: 1 via ORAL
  Filled 2020-01-21: qty 1

## 2020-01-21 MED ORDER — ENSURE ENLIVE PO LIQD
237.0000 mL | Freq: Three times a day (TID) | ORAL | Status: DC
  Administered 2020-01-21 – 2020-01-23 (×3): 237 mL via ORAL

## 2020-01-21 MED ORDER — HYDROMORPHONE HCL 1 MG/ML IJ SOLN
1.0000 mg | INTRAMUSCULAR | Status: DC | PRN
  Administered 2020-01-21 – 2020-01-23 (×11): 1 mg via INTRAVENOUS
  Filled 2020-01-21 (×11): qty 1

## 2020-01-21 MED ORDER — ALPRAZOLAM 1 MG PO TABS: 1.0000 mg | ORAL_TABLET | Freq: Every evening | ORAL | Status: DC | PRN

## 2020-01-21 MED ORDER — LORAZEPAM 2 MG/ML IJ SOLN
1.0000 mg | Freq: Once | INTRAMUSCULAR | Status: AC
  Administered 2020-01-21: 1 mg via INTRAVENOUS
  Filled 2020-01-21: qty 1

## 2020-01-21 MED ORDER — ONDANSETRON HCL 4 MG PO TABS: 4.0000 mg | ORAL_TABLET | Freq: Four times a day (QID) | ORAL | Status: DC | PRN

## 2020-01-21 MED ORDER — ENOXAPARIN SODIUM 30 MG/0.3ML ~~LOC~~ SOLN
30.0000 mg | Freq: Every day | SUBCUTANEOUS | Status: DC
  Administered 2020-01-21 – 2020-01-22 (×2): 30 mg via SUBCUTANEOUS
  Filled 2020-01-21 (×2): qty 0.3

## 2020-01-21 NOTE — ED Notes (Signed)
Pt returned from MRI. Asleep in room. Does not appear in distress respirations are even and non-labored. Skin is warm, dry and intact Call light within reach

## 2020-01-21 NOTE — ED Notes (Signed)
Pt awake and c/o 9/10 pain. Pt given PRN dose of Dilaudid and reposition with some improvement in pain.  Pt requesting breakfast, informed it has been ordered.

## 2020-01-21 NOTE — ED Notes (Signed)
PT in room with pt  Pt continues to deny pain at this time

## 2020-01-21 NOTE — Progress Notes (Signed)
Pt was able to be assisted to sit up and try a few steps, but is weak and struggling to tolerate movement.  Her recurrent CA is going to be her challenge with discharge planning, and initially PT is asking for SNF to get a more controlled setting with gentle rehab for her.  If care level looks inappropriate, may need to change this plan, but for now will work toward gait and transfers to get home. Follow acutely for these deficits listed.  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  Had hip and leg pain but when PT asked was not in pain.  Has reactive cord edema, at cervical and thoracic levels. Leptomeningeal disease with mets.  PMHx:  breast CA, cocaine abuse, depression, BPD1, anxiety, brain mets, PTSD, COPD, auditory hallucinations. chemo related PN,   Precautions  Precautions Fall  Precaution Comments lethargic today  Restrictions  Weight Bearing Restrictions No  Home Living  Family/patient expects to be discharged to: Private residence  Living Arrangements Non-relatives/Friends  Available Help at Discharge Friend(s);Available PRN/intermittently  Type of Home House  Home Access Level entry;Stairs to enter  Entrance Stairs-Number of Steps 3  Entrance Stairs-Rails Right;Left;Can reach both  Home Layout One level  Designer, jewellery  Home Equipment None  Additional Comments some history obtained from chart as pt cannot answer questions well, is lethargic with recent pain meds after MRI  Prior Function  Level of Independence Independent  Communication  Communication No difficulties  Pain Assessment  Pain Assessment No/denies pain (nsg reports meds given after MRI)  Cognition  Arousal/Alertness Lethargic;Suspect due to medications  Behavior During Therapy Flat affect  Overall Cognitive  Status No family/caregiver present to determine baseline cognitive functioning  General Comments pt is likely reacting to meds but is sleepy and limited interaction verbally with PT  Upper Extremity Assessment  Upper Extremity Assessment Generalized weakness  Lower Extremity Assessment  Lower Extremity Assessment Generalized weakness  Cervical / Trunk Assessment  Cervical / Trunk Assessment Other exceptions (leptomeningeal disease)  Bed Mobility  Overal bed mobility Needs Assistance  Bed Mobility Supine to Sit;Sit to Supine  Supine to sit Mod assist  Sit to supine Mod assist  General bed mobility comments mod for trunk and leg support, weak and unsteady in transition  Transfers  Overall transfer level Needs assistance  Equipment used 1 person hand held assist  Transfers Sit to/from Stand  Sit to Stand Mod assist  General transfer comment mod to steady and control balance on siide of bed  Ambulation/Gait  Ambulation/Gait assistance Min assist  Gait Distance (Feet) 3 Feet  Assistive device 1 person hand held assist  Gait Pattern/deviations Step-to pattern;Shuffle;Decreased stride length  General Gait Details sidesteps with bed used for upright support, buckling on RLE especially  Gait velocity reduced  Gait velocity interpretation <1.8 ft/sec, indicate of risk for recurrent falls  Balance  Overall balance assessment Needs assistance  General Comments  General comments (skin integrity, edema, etc.) Pt is on side of bed and assisted to balance, then assisted to stand to try sidesteps.  Pt is not in pain and had meds before PT arrival  Exercises  Exercises Other exercises (3/5 strength demonstrated by pt)  PT - End of Session  Activity Tolerance Patient limited by fatigue;Treatment limited secondary to  medical complications (Comment)  Patient left in bed;with call bell/phone within reach  Nurse Communication Mobility status  PT Assessment  PT Recommendation/Assessment Patient  needs continued PT services  PT Visit Diagnosis Unsteadiness on feet (R26.81);Muscle weakness (generalized) (M62.81);Other abnormalities of gait and mobility (R26.89);Pain  Pain - Right/Left  (bilateral)  Pain - part of body Hip  PT Problem List Decreased strength;Decreased range of motion;Decreased activity tolerance;Decreased balance;Decreased mobility;Decreased coordination;Decreased knowledge of use of DME;Decreased safety awareness;Pain  Barriers to Discharge Inaccessible home environment;Decreased caregiver support  Barriers to Discharge Comments inconsistent help at home  PT Plan  PT Frequency (ACUTE ONLY) Min 2X/week  PT Treatment/Interventions (ACUTE ONLY) DME instruction;Gait training;Stair training;Functional mobility training;Therapeutic activities;Therapeutic exercise;Balance training;Neuromuscular re-education;Patient/family education  AM-PAC PT "6 Clicks" Mobility Outcome Measure (Version 2)  Help needed turning from your back to your side while in a flat bed without using bedrails? 2  Help needed moving from lying on your back to sitting on the side of a flat bed without using bedrails? 2  Help needed moving to and from a bed to a chair (including a wheelchair)? 2  Help needed standing up from a chair using your arms (e.g., wheelchair or bedside chair)? 2  Help needed to walk in hospital room? 2  Help needed climbing 3-5 steps with a railing?  1  6 Click Score 11  Consider Recommendation of Discharge To: CIR/SNF/LTACH  PT Recommendation  Follow Up Recommendations SNF  PT equipment None recommended by PT  Individuals Consulted  Consulted and Agree with Results and Recommendations Patient  Acute Rehab PT Goals  Patient Stated Goal none stated  PT Goal Formulation With patient  Time For Goal Achievement 02/04/20  Potential to Achieve Goals Fair  PT Time Calculation  PT Start Time (ACUTE ONLY) 1210  PT Stop Time (ACUTE ONLY) 1230  PT Time Calculation (min) (ACUTE ONLY) 20  min  PT General Charges  $$ ACUTE PT VISIT 1 Visit  PT Evaluation  $PT Eval Moderate Complexity 1 Mod  Written Expression  Dominant Hand Right    Mee Hives, PT MS Acute Rehab Dept. Number: [place] and [place]

## 2020-01-21 NOTE — ED Provider Notes (Signed)
Patient transferred from [facility] emergency department to get MRI scans to look for evidence of possible involvement of the spinal cord with metastatic breast cancer.  She had presented with leg numbness.  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  On my exam, she is generally weak with muscle strength at 3/5 in pretty much all muscles tested.  She has decreased sensation of the right foot, but this is more in the pattern of peripheral neuropathy then nerve root compression or spinal cord lesion.  She is generally hyporeflexic throughout.  MRIs are ordered of the entire spine.  MRIs show diffused leptomeningeal contrast-enhancement on the surface of the cervical and thoracic spinal cord and cauda equina consistent with leptomeningeal metastatic disease.  Also, right cerebellar tumor which likely is a metastasis which had not been seen on a CT.  She will need MRI of the brain.  Case is discussed with Dr. Alcario Drought of [facility], who agrees to admit the patient.  Results for orders placed or performed during the hospital encounter of 01/20/20  Respiratory Panel by RT PCR (Flu A&B, Covid) - Nasopharyngeal Swab   Specimen: Nasopharyngeal Swab  Result Value Ref Range   SARS Coronavirus 2 by RT PCR NEGATIVE NEGATIVE   Influenza A by PCR NEGATIVE NEGATIVE   Influenza B by PCR NEGATIVE NEGATIVE  Comprehensive metabolic panel  Result Value Ref Range   Sodium 142 135 - 145 mmol/L   Potassium 4.2 3.5 - 5.1 mmol/L   Chloride 99 98 - 111 mmol/L   CO2 30 22 - 32 mmol/L   Glucose, Bld 78 70 - 99 mg/dL   BUN 21 (H) 6 - 20 mg/dL   Creatinine, Ser 0.86 0.44 - 1.00 mg/dL   Calcium 10.1 8.9 - 10.3 mg/dL   Total Protein 8.0 6.5 - 8.1 g/dL   Albumin 4.2 3.5 - 5.0 g/dL   AST 28 15 - 41 U/L   ALT 20 0 - 44 U/L   Alkaline Phosphatase 54 38 - 126 U/L   Total Bilirubin 0.6 0.3 - 1.2 mg/dL   GFR, Estimated >60 >60 mL/min   Anion gap 13 5 - 15  CBC with Differential  Result Value Ref Range    WBC 4.5 4.0 - 10.5 K/uL   RBC 4.25 3.87 - 5.11 MIL/uL   Hemoglobin 13.9 12.0 - 15.0 g/dL   HCT 41.9 36 - 46 %   MCV 98.6 80.0 - 100.0 fL   MCH 32.7 26.0 - 34.0 pg   MCHC 33.2 30.0 - 36.0 g/dL   RDW 12.0 11.5 - 15.5 %   Platelets 336 150 - 400 K/uL   nRBC 0.0 0.0 - 0.2 %   Neutrophils Relative % 55 %   Neutro Abs 2.5 1.7 - 7.7 K/uL   Lymphocytes Relative 33 %   Lymphs Abs 1.5 0.7 - 4.0 K/uL   Monocytes Relative 10 %   Monocytes Absolute 0.4 0.1 - 1.0 K/uL   Eosinophils Relative 1 %   Eosinophils Absolute 0.0 0 - 0 K/uL   Basophils Relative 1 %   Basophils Absolute 0.0 0 - 0 K/uL   Immature Granulocytes 0 %   Abs Immature Granulocytes 0.00 0.00 - 0.07 K/uL  CBG monitoring, ED  Result Value Ref Range   Glucose-Capillary 128 (H) 70 - 99 mg/dL   CT Head Wo Contrast  Result Date: 01/20/2020 CLINICAL DATA:  44 year old female with leg weakness. History of breast cancer with metastasis to brain. EXAM: CT HEAD WITHOUT  CONTRAST TECHNIQUE: Contiguous axial images were obtained from the base of the skull through the vertex without intravenous contrast. COMPARISON:  Brain MRI dated 09/05/2019. FINDINGS: Evaluation of this exam is limited due to motion artifact. Brain: The ventricles and sulci appropriate size for patient's age. There is an area of low attenuation in the right cerebellar hemisphere most consistent with edema and likely postsurgical. There is no acute intracranial hemorrhage. No mass effect or midline shift. No extra-axial fluid collection. Vascular: No hyperdense vessel or unexpected calcification. Skull: Right occipital craniectomy. No acute calvarial pathology. Sinuses/Orbits: No acute finding. Other: None IMPRESSION: 1. No acute intracranial pathology. 2. Postsurgical changes of right occipital lobe. Electronically Signed   By: Anner Crete M.D.   On: 01/20/2020 20:28   MR Cervical Spine W or Wo Contrast  Result Date: 01/21/2020 CLINICAL DATA:  Diffuse weakness EXAM:  MRI CERVICAL, THORACIC AND LUMBAR SPINE WITHOUT AND WITH CONTRAST TECHNIQUE: Multiplanar and multiecho pulse sequences of the cervical spine, to include the craniocervical junction and cervicothoracic junction, and thoracic and lumbar spine, were obtained without and with intravenous contrast. CONTRAST:  44mL GADAVIST GADOBUTROL 1 MMOL/ML IV SOLN COMPARISON:  None. FINDINGS: MRI CERVICAL SPINE FINDINGS Alignment: Physiologic. Vertebrae: No fracture, evidence of discitis, or bone lesion. Cord: There is diffuse leptomeningeal contrast enhancement along the surface of the cervical spinal cord. Additionally, there is intraparenchymal signal abnormality at the C4-7 levels. Posterior Fossa, vertebral arteries, paraspinal tissues: Incompletely visualized right cerebellar tumor with extension along the right leaflet of the tentorium cerebelli. Disc levels: No significant disc herniation or spinal canal stenosis. MRI THORACIC SPINE FINDINGS Alignment:  Physiologic. Vertebrae: No fracture, evidence of discitis, or bone lesion. Cord: Diffuse leptomeningeal contrast enhancement primarily along the dorsal surface of the thoracic spinal cord. There is intraparenchymal hyperintense T2-weighted signal that extends from the cervicothoracic junction to the T10 level. Paraspinal and other soft tissues: Negative Disc levels: No disc herniation or spinal canal stenosis. MRI LUMBAR SPINE FINDINGS Segmentation:  Standard. Alignment:  Physiologic. Vertebrae:  No fracture, evidence of discitis, or bone lesion. Conus medullaris and cauda equina: Conus extends to the L1 level. There is clumping of the cauda equina nerve roots with multifocal abnormal contrast enhancement. Paraspinal and other soft tissues: Negative Disc levels: No disc herniation, spinal canal stenosis or neural foraminal stenosis. IMPRESSION: 1. Diffuse leptomeningeal contrast enhancement along the surface of the cervical and thoracic spinal cord and the cauda equina nerve  roots. The findings are most consistent with leptomeningeal metastatic disease. 2. Intraparenchymal signal abnormality within the cervical and thoracic spinal cord is likely reactive edema due to the leptomeningeal disease. 3. Incompletely visualized right cerebellar tumor with extension along the right leaflet of the tentorium cerebelli. MRI of the brain with without contrast should be considered for more complete characterization. Electronically Signed   By: Ulyses Jarred M.D.   On: 01/21/2020 03:42   MR THORACIC SPINE W WO CONTRAST  Result Date: 01/21/2020 CLINICAL DATA:  Diffuse weakness EXAM: MRI CERVICAL, THORACIC AND LUMBAR SPINE WITHOUT AND WITH CONTRAST TECHNIQUE: Multiplanar and multiecho pulse sequences of the cervical spine, to include the craniocervical junction and cervicothoracic junction, and thoracic and lumbar spine, were obtained without and with intravenous contrast. CONTRAST:  59mL GADAVIST GADOBUTROL 1 MMOL/ML IV SOLN COMPARISON:  None. FINDINGS: MRI CERVICAL SPINE FINDINGS Alignment: Physiologic. Vertebrae: No fracture, evidence of discitis, or bone lesion. Cord: There is diffuse leptomeningeal contrast enhancement along the surface of the cervical spinal cord. Additionally, there is intraparenchymal signal  abnormality at the C4-7 levels. Posterior Fossa, vertebral arteries, paraspinal tissues: Incompletely visualized right cerebellar tumor with extension along the right leaflet of the tentorium cerebelli. Disc levels: No significant disc herniation or spinal canal stenosis. MRI THORACIC SPINE FINDINGS Alignment:  Physiologic. Vertebrae: No fracture, evidence of discitis, or bone lesion. Cord: Diffuse leptomeningeal contrast enhancement primarily along the dorsal surface of the thoracic spinal cord. There is intraparenchymal hyperintense T2-weighted signal that extends from the cervicothoracic junction to the T10 level. Paraspinal and other soft tissues: Negative Disc levels: No disc  herniation or spinal canal stenosis. MRI LUMBAR SPINE FINDINGS Segmentation:  Standard. Alignment:  Physiologic. Vertebrae:  No fracture, evidence of discitis, or bone lesion. Conus medullaris and cauda equina: Conus extends to the L1 level. There is clumping of the cauda equina nerve roots with multifocal abnormal contrast enhancement. Paraspinal and other soft tissues: Negative Disc levels: No disc herniation, spinal canal stenosis or neural foraminal stenosis. IMPRESSION: 1. Diffuse leptomeningeal contrast enhancement along the surface of the cervical and thoracic spinal cord and the cauda equina nerve roots. The findings are most consistent with leptomeningeal metastatic disease. 2. Intraparenchymal signal abnormality within the cervical and thoracic spinal cord is likely reactive edema due to the leptomeningeal disease. 3. Incompletely visualized right cerebellar tumor with extension along the right leaflet of the tentorium cerebelli. MRI of the brain with without contrast should be considered for more complete characterization. Electronically Signed   By: Ulyses Jarred M.D.   On: 01/21/2020 03:42   MR Lumbar Spine W Wo Contrast  Result Date: 01/21/2020 CLINICAL DATA:  Diffuse weakness EXAM: MRI CERVICAL, THORACIC AND LUMBAR SPINE WITHOUT AND WITH CONTRAST TECHNIQUE: Multiplanar and multiecho pulse sequences of the cervical spine, to include the craniocervical junction and cervicothoracic junction, and thoracic and lumbar spine, were obtained without and with intravenous contrast. CONTRAST:  67mL GADAVIST GADOBUTROL 1 MMOL/ML IV SOLN COMPARISON:  None. FINDINGS: MRI CERVICAL SPINE FINDINGS Alignment: Physiologic. Vertebrae: No fracture, evidence of discitis, or bone lesion. Cord: There is diffuse leptomeningeal contrast enhancement along the surface of the cervical spinal cord. Additionally, there is intraparenchymal signal abnormality at the C4-7 levels. Posterior Fossa, vertebral arteries, paraspinal  tissues: Incompletely visualized right cerebellar tumor with extension along the right leaflet of the tentorium cerebelli. Disc levels: No significant disc herniation or spinal canal stenosis. MRI THORACIC SPINE FINDINGS Alignment:  Physiologic. Vertebrae: No fracture, evidence of discitis, or bone lesion. Cord: Diffuse leptomeningeal contrast enhancement primarily along the dorsal surface of the thoracic spinal cord. There is intraparenchymal hyperintense T2-weighted signal that extends from the cervicothoracic junction to the T10 level. Paraspinal and other soft tissues: Negative Disc levels: No disc herniation or spinal canal stenosis. MRI LUMBAR SPINE FINDINGS Segmentation:  Standard. Alignment:  Physiologic. Vertebrae:  No fracture, evidence of discitis, or bone lesion. Conus medullaris and cauda equina: Conus extends to the L1 level. There is clumping of the cauda equina nerve roots with multifocal abnormal contrast enhancement. Paraspinal and other soft tissues: Negative Disc levels: No disc herniation, spinal canal stenosis or neural foraminal stenosis. IMPRESSION: 1. Diffuse leptomeningeal contrast enhancement along the surface of the cervical and thoracic spinal cord and the cauda equina nerve roots. The findings are most consistent with leptomeningeal metastatic disease. 2. Intraparenchymal signal abnormality within the cervical and thoracic spinal cord is likely reactive edema due to the leptomeningeal disease. 3. Incompletely visualized right cerebellar tumor with extension along the right leaflet of the tentorium cerebelli. MRI of the brain with without contrast should  be considered for more complete characterization. Electronically Signed   By: Ulyses Jarred M.D.   On: 01/21/2020 03:42   Images viewed by me.    Delora Fuel, MD 10/21/50 [phone]

## 2020-01-21 NOTE — ED Notes (Signed)
Thomas ([facility]/transfer to [place]) called @ 1334)-per Roselyn Reef, RN called by Levada Dy

## 2020-01-21 NOTE — ED Notes (Addendum)
Pt transported to MRI, per MRI tech pt will be gone for about 2-2.5 hrs.

## 2020-01-21 NOTE — Progress Notes (Signed)
Dr. Lisbeth Renshaw notified me of this patient and has spoken with the primary team about her case.  Unfortunately she has been found to have leptomeningeal disease, and Dr. Lindi Adie has recommended hospice care.  At this time the communication has been that she is not having any symptoms of pain in her spine, but we could revisit the role for palliative radiotherapy depending on the clinical course.  Her situation though is quite complicated, her uncle Harrie Jeans has been the most stable family member that we have encountered in the courses of her treatment that she is received under Dr. Ida Rogue care.  Please let us know if we can be of any assistance.    Carola Rhine, PAC

## 2020-01-21 NOTE — H&P (Signed)
History and Physical    Diane Rosario QJS:[id number] DOB: 1975-07-11 DOA: 01/20/2020  PCP: Department, [facility]  Patient coming from: Home  I have personally briefly reviewed patient's old medical records in [facility]  Chief Complaint: Numbness, weakness  HPI: Diane Rosario is a 44 y.o. 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  Pt underwent craniotomy for occipital met in May followed by 27 out of 30 prescribed Gy whole brain radiation therapy completed in  Aug.  Pt presents to ED with generalized weakness and new inability to walk. Some pain in buttock area that radiates down legs.  Symptoms onset 1 week ago, progressively worsening, nothing makes better or worse.  Repeated falls initially, now unable to walk.  Denies dysuria, urinary incontinence, or other urinary symptoms.   ED Course: CT head was neg.  Pt transferred to [facility] to obtain MRI w/wo contrast of C, T, and L spines.  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  Also, the previously resected cerebellar tumor appears to be back, MRI brain recommended.   Review of Systems: As per HPI, otherwise all review of systems negative.  Past Medical History:  Diagnosis Date  . Anxiety   . Bipolar 1 disorder (HCC)    pt reports bipolar, pt reports Auditory hallucinations  . COPD (chronic obstructive pulmonary disease) (HCC)   . Depression   . Malignant neoplasm of upper-outer quadrant of left breast in female, estrogen receptor positive (HCC) 11/03/2016  . PTSD (post-traumatic stress disorder)    husband was murdered    Past Surgical History:  Procedure Laterality Date  . EYE SURGERY     both eyes ; correct weak eye muscles   . MASTECTOMY MODIFIED RADICAL Left 12/14/2016   Procedure: LEFT MODIFIED RADICAL MASTECTOMY WITH ERAS PATHWAY;  Surgeon:  Claud Kelp, MD;  Location: [facility];  Service: General;  Laterality: Left;  . PORTACATH PLACEMENT Right 12/14/2016   Procedure: INSERTION PORT-A-CATH WITH ULTRASOUND;  Surgeon: Claud Kelp, MD;  Location: [facility];  Service: General;  Laterality: Right;  . RETROSIGMOID CRANIECTOMY FOR TUMOR RESECTION Right 09/04/2019   Procedure: SUBOCCIPITAL  CRANIECTOMY FOR TUMOR RESECTION;  Surgeon: Julio Sicks, MD;  Location: [facility] OR;  Service: Neurosurgery;  Laterality: Right;     reports that she has been smoking cigarettes. She has a 2.50 pack-year smoking history. She has never used smokeless tobacco. She reports previous alcohol use. She reports current drug use. Drugs: Marijuana, Cocaine, and "Crack" cocaine.  Allergies  Allergen Reactions  . Asa [Aspirin] Hives    Family History  Problem Relation Age of Onset  . Hypertension Maternal Aunt   . Hypertension Maternal Grandmother   . Cirrhosis Father   . Cancer Father        unknown type     Prior to Admission medications   Medication Sig Start Date End Date Taking? Authorizing Provider  ALPRAZolam Prudy Feeler) 1 MG tablet Take 1 mg by mouth at bedtime as needed for anxiety.   Yes [provider]  ENSURE (ENSURE) Take 237 mLs by mouth 3 (three) times daily between meals.    Yes [provider]  HYDROcodone-acetaminophen (NORCO) 5-325 MG tablet Take 1 tablet by mouth every 6 (six) hours as needed for moderate pain. 10/16/19   Dorothy Puffer, MD  tamoxifen (NOLVADEX) 20 MG tablet Take 1 tablet (20 mg total) by mouth 2 (two) times  daily. Patient not taking: Reported on 09/23/2019 09/19/17   Nicholas Lose, MD    Physical Exam: Vitals:   01/20/20 2230 01/20/20 2300 01/21/20 0009 01/21/20 0334  BP: (!) 142/91 (!) 143/99 124/79 (!) 118/95  Pulse: 63 61 (!) 59 70  Resp: $Remo'18 15 18   'ZUaLk$ Temp:   98.9 F (37.2 C)   TempSrc:   Oral   SpO2: 97% 98% 100% 100%  Weight:      Height:        Constitutional: NAD, calm, comfortable Eyes: PERRL,  lids and conjunctivae normal ENMT: Mucous membranes are moist. Posterior pharynx clear of any exudate or lesions.Normal dentition.  Neck: normal, supple, no masses, no thyromegaly Respiratory: clear to auscultation bilaterally, no wheezing, no crackles. Normal respiratory effort. No accessory muscle use.  Cardiovascular: Regular rate and rhythm, no murmurs / rubs / gallops. No extremity edema. 2+ pedal pulses. No carotid bruits.  Abdomen: no tenderness, no masses palpated. No hepatosplenomegaly. Bowel sounds positive.  Musculoskeletal: no clubbing / cyanosis. No joint deformity upper and lower extremities. Good ROM, no contractures. Normal muscle tone.  Skin: no rashes, lesions, ulcers. No induration Neurologic: CN 2-12 grossly intact. Sensation Decreased in R foot, DTR normal. Strength 3/5 in all 4. Psychiatric: Patient sedated due to dilaudid + Ativan.  Wakes up, answers questions, but falls back asleep easily either due to meds and/or due to it being 4am.   Labs on Admission: I have personally reviewed following labs and imaging studies  CBC: Recent Labs  Lab 01/20/20 1954  WBC 4.5  NEUTROABS 2.5  HGB 13.9  HCT 41.9  MCV 98.6  PLT 789   Basic Metabolic Panel: Recent Labs  Lab 01/20/20 1954  NA 142  K 4.2  CL 99  CO2 30  GLUCOSE 78  BUN 21*  CREATININE 0.86  CALCIUM 10.1   GFR: Estimated Creatinine Clearance: 51.1 mL/min (by C-G formula based on SCr of 0.86 mg/dL). Liver Function Tests: Recent Labs  Lab 01/20/20 1954  AST 28  ALT 20  ALKPHOS 54  BILITOT 0.6  PROT 8.0  ALBUMIN 4.2   No results for input(s): LIPASE, AMYLASE in the last 168 hours. No results for input(s): AMMONIA in the last 168 hours. Coagulation Profile: No results for input(s): INR, PROTIME in the last 168 hours. Cardiac Enzymes: No results for input(s): CKTOTAL, CKMB, CKMBINDEX, TROPONINI in the last 168 hours. BNP (last 3 results) No results for input(s): PROBNP in the last 8760  hours. HbA1C: No results for input(s): HGBA1C in the last 72 hours. CBG: Recent Labs  Lab 01/20/20 1826  GLUCAP 128*   Lipid Profile: No results for input(s): CHOL, HDL, LDLCALC, TRIG, CHOLHDL, LDLDIRECT in the last 72 hours. Thyroid Function Tests: No results for input(s): TSH, T4TOTAL, FREET4, T3FREE, THYROIDAB in the last 72 hours. Anemia Panel: No results for input(s): VITAMINB12, FOLATE, FERRITIN, TIBC, IRON, RETICCTPCT in the last 72 hours. Urine analysis:    Component Value Date/Time   COLORURINE AMBER (A) 09/02/2019 2205   APPEARANCEUR CLOUDY (A) 09/02/2019 2205   LABSPEC 1.035 (H) 09/02/2019 2205   PHURINE 5.0 09/02/2019 2205   GLUCOSEU NEGATIVE 09/02/2019 2205   HGBUR SMALL (A) 09/02/2019 2205   BILIRUBINUR NEGATIVE 09/02/2019 2205   KETONESUR NEGATIVE 09/02/2019 2205   PROTEINUR 100 (A) 09/02/2019 2205   UROBILINOGEN 0.2 09/05/2012 1632   NITRITE POSITIVE (A) 09/02/2019 2205   LEUKOCYTESUR MODERATE (A) 09/02/2019 2205    Radiological Exams on Admission: CT Head Wo Contrast  Result Date: 01/20/2020 CLINICAL DATA:  44 year old female with leg weakness. History of breast cancer with metastasis to brain. EXAM: CT HEAD WITHOUT CONTRAST TECHNIQUE: Contiguous axial images were obtained from the base of the skull through the vertex without intravenous contrast. COMPARISON:  Brain MRI dated 09/05/2019. FINDINGS: Evaluation of this exam is limited due to motion artifact. Brain: The ventricles and sulci appropriate size for patient's age. There is an area of low attenuation in the right cerebellar hemisphere most consistent with edema and likely postsurgical. There is no acute intracranial hemorrhage. No mass effect or midline shift. No extra-axial fluid collection. Vascular: No hyperdense vessel or unexpected calcification. Skull: Right occipital craniectomy. No acute calvarial pathology. Sinuses/Orbits: No acute finding. Other: None IMPRESSION: 1. No acute intracranial  pathology. 2. Postsurgical changes of right occipital lobe. Electronically Signed   By: Anner Crete M.D.   On: 01/20/2020 20:28   MR Cervical Spine W or Wo Contrast  Result Date: 01/21/2020 CLINICAL DATA:  Diffuse weakness EXAM: MRI CERVICAL, THORACIC AND LUMBAR SPINE WITHOUT AND WITH CONTRAST TECHNIQUE: Multiplanar and multiecho pulse sequences of the cervical spine, to include the craniocervical junction and cervicothoracic junction, and thoracic and lumbar spine, were obtained without and with intravenous contrast. CONTRAST:  67mL GADAVIST GADOBUTROL 1 MMOL/ML IV SOLN COMPARISON:  None. FINDINGS: MRI CERVICAL SPINE FINDINGS Alignment: Physiologic. Vertebrae: No fracture, evidence of discitis, or bone lesion. Cord: There is diffuse leptomeningeal contrast enhancement along the surface of the cervical spinal cord. Additionally, there is intraparenchymal signal abnormality at the C4-7 levels. Posterior Fossa, vertebral arteries, paraspinal tissues: Incompletely visualized right cerebellar tumor with extension along the right leaflet of the tentorium cerebelli. Disc levels: No significant disc herniation or spinal canal stenosis. MRI THORACIC SPINE FINDINGS Alignment:  Physiologic. Vertebrae: No fracture, evidence of discitis, or bone lesion. Cord: Diffuse leptomeningeal contrast enhancement primarily along the dorsal surface of the thoracic spinal cord. There is intraparenchymal hyperintense T2-weighted signal that extends from the cervicothoracic junction to the T10 level. Paraspinal and other soft tissues: Negative Disc levels: No disc herniation or spinal canal stenosis. MRI LUMBAR SPINE FINDINGS Segmentation:  Standard. Alignment:  Physiologic. Vertebrae:  No fracture, evidence of discitis, or bone lesion. Conus medullaris and cauda equina: Conus extends to the L1 level. There is clumping of the cauda equina nerve roots with multifocal abnormal contrast enhancement. Paraspinal and other soft tissues:  Negative Disc levels: No disc herniation, spinal canal stenosis or neural foraminal stenosis. IMPRESSION: 1. Diffuse leptomeningeal contrast enhancement along the surface of the cervical and thoracic spinal cord and the cauda equina nerve roots. The findings are most consistent with leptomeningeal metastatic disease. 2. Intraparenchymal signal abnormality within the cervical and thoracic spinal cord is likely reactive edema due to the leptomeningeal disease. 3. Incompletely visualized right cerebellar tumor with extension along the right leaflet of the tentorium cerebelli. MRI of the brain with without contrast should be considered for more complete characterization. Electronically Signed   By: Ulyses Jarred M.D.   On: 01/21/2020 03:42   MR THORACIC SPINE W WO CONTRAST  Result Date: 01/21/2020 CLINICAL DATA:  Diffuse weakness EXAM: MRI CERVICAL, THORACIC AND LUMBAR SPINE WITHOUT AND WITH CONTRAST TECHNIQUE: Multiplanar and multiecho pulse sequences of the cervical spine, to include the craniocervical junction and cervicothoracic junction, and thoracic and lumbar spine, were obtained without and with intravenous contrast. CONTRAST:  20mL GADAVIST GADOBUTROL 1 MMOL/ML IV SOLN COMPARISON:  None. FINDINGS: MRI CERVICAL SPINE FINDINGS Alignment: Physiologic. Vertebrae: No fracture, evidence of discitis,  or bone lesion. Cord: There is diffuse leptomeningeal contrast enhancement along the surface of the cervical spinal cord. Additionally, there is intraparenchymal signal abnormality at the C4-7 levels. Posterior Fossa, vertebral arteries, paraspinal tissues: Incompletely visualized right cerebellar tumor with extension along the right leaflet of the tentorium cerebelli. Disc levels: No significant disc herniation or spinal canal stenosis. MRI THORACIC SPINE FINDINGS Alignment:  Physiologic. Vertebrae: No fracture, evidence of discitis, or bone lesion. Cord: Diffuse leptomeningeal contrast enhancement primarily along  the dorsal surface of the thoracic spinal cord. There is intraparenchymal hyperintense T2-weighted signal that extends from the cervicothoracic junction to the T10 level. Paraspinal and other soft tissues: Negative Disc levels: No disc herniation or spinal canal stenosis. MRI LUMBAR SPINE FINDINGS Segmentation:  Standard. Alignment:  Physiologic. Vertebrae:  No fracture, evidence of discitis, or bone lesion. Conus medullaris and cauda equina: Conus extends to the L1 level. There is clumping of the cauda equina nerve roots with multifocal abnormal contrast enhancement. Paraspinal and other soft tissues: Negative Disc levels: No disc herniation, spinal canal stenosis or neural foraminal stenosis. IMPRESSION: 1. Diffuse leptomeningeal contrast enhancement along the surface of the cervical and thoracic spinal cord and the cauda equina nerve roots. The findings are most consistent with leptomeningeal metastatic disease. 2. Intraparenchymal signal abnormality within the cervical and thoracic spinal cord is likely reactive edema due to the leptomeningeal disease. 3. Incompletely visualized right cerebellar tumor with extension along the right leaflet of the tentorium cerebelli. MRI of the brain with without contrast should be considered for more complete characterization. Electronically Signed   By: Ulyses Jarred M.D.   On: 01/21/2020 03:42   MR Lumbar Spine W Wo Contrast  Result Date: 01/21/2020 CLINICAL DATA:  Diffuse weakness EXAM: MRI CERVICAL, THORACIC AND LUMBAR SPINE WITHOUT AND WITH CONTRAST TECHNIQUE: Multiplanar and multiecho pulse sequences of the cervical spine, to include the craniocervical junction and cervicothoracic junction, and thoracic and lumbar spine, were obtained without and with intravenous contrast. CONTRAST:  63mL GADAVIST GADOBUTROL 1 MMOL/ML IV SOLN COMPARISON:  None. FINDINGS: MRI CERVICAL SPINE FINDINGS Alignment: Physiologic. Vertebrae: No fracture, evidence of discitis, or bone lesion.  Cord: There is diffuse leptomeningeal contrast enhancement along the surface of the cervical spinal cord. Additionally, there is intraparenchymal signal abnormality at the C4-7 levels. Posterior Fossa, vertebral arteries, paraspinal tissues: Incompletely visualized right cerebellar tumor with extension along the right leaflet of the tentorium cerebelli. Disc levels: No significant disc herniation or spinal canal stenosis. MRI THORACIC SPINE FINDINGS Alignment:  Physiologic. Vertebrae: No fracture, evidence of discitis, or bone lesion. Cord: Diffuse leptomeningeal contrast enhancement primarily along the dorsal surface of the thoracic spinal cord. There is intraparenchymal hyperintense T2-weighted signal that extends from the cervicothoracic junction to the T10 level. Paraspinal and other soft tissues: Negative Disc levels: No disc herniation or spinal canal stenosis. MRI LUMBAR SPINE FINDINGS Segmentation:  Standard. Alignment:  Physiologic. Vertebrae:  No fracture, evidence of discitis, or bone lesion. Conus medullaris and cauda equina: Conus extends to the L1 level. There is clumping of the cauda equina nerve roots with multifocal abnormal contrast enhancement. Paraspinal and other soft tissues: Negative Disc levels: No disc herniation, spinal canal stenosis or neural foraminal stenosis. IMPRESSION: 1. Diffuse leptomeningeal contrast enhancement along the surface of the cervical and thoracic spinal cord and the cauda equina nerve roots. The findings are most consistent with leptomeningeal metastatic disease. 2. Intraparenchymal signal abnormality within the cervical and thoracic spinal cord is likely reactive edema due to the leptomeningeal disease. 3.  Incompletely visualized right cerebellar tumor with extension along the right leaflet of the tentorium cerebelli. MRI of the brain with without contrast should be considered for more complete characterization. Electronically Signed   By: Deatra Robinson M.D.   On:  01/21/2020 03:42    EKG: Independently reviewed.  Assessment/Plan Principal Problem:   Metastatic cancer to spine [facility]) Active Problems:   Malignant neoplasm of upper-outer quadrant of left breast in female, estrogen receptor positive (HCC)   Cancer of overlapping sites of left female breast (HCC)   Chemotherapy-induced neuropathy (HCC)   Brain metastases (HCC)   Bilateral leg weakness    1. Breast cancer metastatic to spine - 1. With cord edema 2. Causing diffuse weakness, numbness, and progressive inability to ambulate over the past week. 3. Decadron for cord edema 4. Doubt that this will be an acute neurosurgical issue since metastatic disease seems to involve pretty much the whole spinal cord. 5. Rad onc and heme onc consults in AM 6. Tried to explain our findings and plan to patient, though she is pretty sedated on medications at this point for pain control. 2. Recurrent brain metastatic disease - 1. MRI with and without contrast of brain ordered per neuro recs 2. Rad onc and heme onc consults as above 3. May wish to get NS opinion in AM, but I suspect they arnt going to be in a rush to re-operate here given recurrence of cancer just a couple of months after last craniotomy, and now this is no longer a "solitary met" as patient has diffuse metastatic disease in spine. 4. Decadron as above.  DVT prophylaxis: Lovenox Code Status: Full Family Communication: No family in room Disposition Plan: TBD, patient non-ambulatory at this point, suspect she may need higher level of care on discharge Consults called: None called, Call heme/onc, rad/onc, and possibly NS in AM. Admission status: Admit to inpatient  Severity of Illness: The appropriate patient status for this patient is INPATIENT. Inpatient status is judged to be reasonable and necessary in order to provide the required intensity of service to ensure the patient's safety. The patient's presenting symptoms, physical exam  findings, and initial radiographic and laboratory data in the context of their chronic comorbidities is felt to place them at high risk for further clinical deterioration. Furthermore, it is not anticipated that the patient will be medically stable for discharge from the hospital within 2 midnights of admission. The following factors support the patient status of inpatient.   IP status as patient has recurrent breast cancer with diffuse mets to spine and new inability to ambulate.  Also a brain met too.   * I certify that at the point of admission it is my clinical judgment that the patient will require inpatient hospital care spanning beyond 2 midnights from the point of admission due to high intensity of service, high risk for further deterioration and high frequency of surveillance required.*    Jaynee Winters M. DO [facility]  How to contact the [facility] Attending or Consulting provider 7A - 7P or covering provider during after hours 7P -7A, for this patient?  1. Check the care team in [facility] and look for a) attending/consulting [facility] provider listed and b) the [facility] team listed 2. Log into [email or web address]  Amion Physician Scheduling and messaging for groups and whole hospitals  On call and physician scheduling software for group practices, residents, hospitalists and other medical providers for call, clinic, rotation and shift schedules. On[employer] is a hospital-wide system for scheduling  doctors and paging doctors on call. EasyPlot is for scientific plotting and data analysis.  [email or web address]  and use [place]'s universal password to access. If you do not have the password, please contact the hospital operator.  3. Locate the [facility] provider you are looking for under [facility] and page to a number that you can be directly reached. 4. If you still have difficulty reaching the provider, please page the [facility] (Director on Call) for the Hospitalists listed on amion for  assistance.  01/21/2020, 4:41 AM

## 2020-01-21 NOTE — ED Notes (Signed)
Pt provided lunch tray and assisted to sitting position in bed to eat.

## 2020-01-21 NOTE — Progress Notes (Signed)
Patient admitted earlier this morning. HPI reviewed.  Patient seen and examined, she is sleepy, receive pain medication, cachetic. She would wake up and answer few questions. I explain to her cancer has spread to her spine and leptomeningeal. Prognosis is not good and she is too weak to tolerate more treatments. Dr Lindi Adie is recommending hospice care. She allows me to call her mother, I try to contact her mother, but no answer.   -Patient to be transfer to [place].  -Dr Lindi Adie consultation to follow.  -Continue with IV steroids.  -Added IV fluids.  -IV Protonix GI prophylaxis.  -MRI brain pending.  -Palliative care consult order place.   -Continue with IV dilaudid for pain management.   Niel Hummer, MD.

## 2020-01-21 NOTE — Telephone Encounter (Signed)
Yes Dr. Lindi Adie has recommended hospice

## 2020-01-21 NOTE — Progress Notes (Signed)
HEMATOLOGY-ONCOLOGY PROGRESS NOTE  SUBJECTIVE: Generalized weakness and inability to walk: Patient was admitted to the hospital and CT of the head initially was obtained and later MRI of the brain and the spinal cord was obtained.  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  She has difficulty with moving her legs and feels very weak in the lower extremities.  She has not been feeling well for the past several weeks. Prior to this she underwent resection of cerebellar tumor and radiation. The rest of the history is detailed below.  Oncology History  Malignant neoplasm of upper-outer quadrant of left breast in female, estrogen receptor positive ([place])  11/01/2016 Initial Diagnosis   Palpable left breast mass with 4 enl axillary lymph nodes 4.8 x 0.8 x 3.1 cm; left axillary mass 1.1 x 0.5 cm, lymph node 1.4 x 0.8 cm biopsy grade 2-3 IDC ER 30% PR 0% HER-2 positive ratio 7.53, Ki-67 20%, T2 N2 MX stage IIIa clinical stage   12/02/2016 Genetic Testing   Negative genetic testing on the STAT panel.  The STAT Breast cancer panel offered by [facility] includes sequencing and rearrangement analysis for the following 9 genes:  ATM, BRCA1, BRCA2, CDH1, CHEK2, PALB2, PTEN, STK11 and TP53.   The report date is December 02, 2016.   12/14/2016 Surgery   Left modified radical mastectomy: IDC grade 3, spans 5 cm and a 2.2 cm with extensive DCIS grade 3, lymphovascular invasion present, margins negative, 17/17 lymph nodes positive, ER 30%, PR 0%, HER-2 positive, Ki-67 20%, T2 N3a M0 stage IIIB AJCC 8   01/10/2017 - 03/27/2018 Chemotherapy   Adjuvant chemotherapy with TCH Perjeta 6 cycles followed by Herceptin Perjeta maintenance (multiple delays and missed doses led to final discontinuation)   06/07/2017 - 07/25/2017 Radiation Therapy   Adjuvant radiation therapy   08/2017 -  Anti-estrogen oral therapy   Tamoxifen daily   09/02/2019 Progression   She was admitted to [facility] from 09/02/19-09/07/19  after presenting to the ED for headaches and unsteady gait. MRI showed a large cerebellar mass causing obstructive hydrocephalus, and she underwent a resection on 09/04/19. Pathology showed metastatic adenocarcinoma, consistent with breast origin, HER-2 positive (3+), ER/PR negative, Ki67 75%.      OBJECTIVE: REVIEW OF SYSTEMS:   Difficulty with ambulation and pain with the movement of the legs All other systems were reviewed with the patient and are negative.  I have reviewed the past medical history, past surgical history, social history and family history with the patient and they are unchanged from previous note.   PHYSICAL EXAMINATION: ECOG PERFORMANCE STATUS: 4 - Bedbound  Vitals:   01/21/20 1255 01/21/20 1442  BP:  113/83  Pulse: (!) 110 86  Resp:  20  Temp:  99.1 F (37.3 C)  SpO2: 93% 96%   Filed Weights   01/20/20 2007  Weight: 85 lb 8.6 oz (38.8 kg)    Frail lady with protein calorie malnutrition Lower extremity weakness   LABORATORY DATA:  I have reviewed the data as listed CMP Latest Ref Rng & Units 01/20/2020 09/05/2019 09/03/2019  Glucose 70 - 99 mg/dL 78 120(H) 143(H)  BUN 6 - 20 mg/dL 21(H) 12 22(H)  Creatinine 0.44 - 1.00 mg/dL 0.86 0.64 0.83  Sodium 135 - 145 mmol/L 142 134(L) 141  Potassium 3.5 - 5.1 mmol/L 4.2 4.2 4.0  Chloride 98 - 111 mmol/L 99 97(L) 101  CO2 22 - 32 mmol/L _0 Calcium 8.9 - 10.3 mg/dL 10.1 9.0 9.5  Total Protein 6.5 - 8.1 g/dL 8.0 - -  Total Bilirubin 0.3 - 1.2 mg/dL 0.6 - -  Alkaline Phos 38 - 126 U/L 54 - -  AST 15 - 41 U/L 28 - -  ALT 0 - 44 U/L 20 - -    Lab Results  Component Value Date   WBC 4.5 01/20/2020   HGB 13.9 01/20/2020   HCT 41.9 01/20/2020   MCV 98.6 01/20/2020   PLT 336 01/20/2020   NEUTROABS 2.5 01/20/2020    ASSESSMENT AND PLAN: 1.  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  In spite of systemic treatments or even intrathecal methotrexate, these cancers tend to be universally terrible prognosis. She is extremely frail and cannot tolerate heavy dose chemotherapy. After discussions with radiation oncology Dr. Lisbeth Renshaw also does not recommend whole spine radiation.  Therefore we recommend hospice care for her.  Patient is very sad to hear this but is in agreement. I spoke to her caregiver and legal guardian who agreed as well.  He would like to speak with the hospice nurse/social worker regarding the options.  His phone number is [phone].  Thank you very much for your care.

## 2020-01-21 NOTE — ED Notes (Signed)
Pt sitting up right in bed. Provided breakfast tray.  Call light within reach, denies further needs at this time

## 2020-01-22 ENCOUNTER — Other Ambulatory Visit: Payer: Self-pay

## 2020-01-22 DIAGNOSIS — Z515 Encounter for palliative care: Secondary | ICD-10-CM

## 2020-01-22 DIAGNOSIS — C7951 Secondary malignant neoplasm of bone: Secondary | ICD-10-CM | POA: Diagnosis not present

## 2020-01-22 DIAGNOSIS — Z7189 Other specified counseling: Secondary | ICD-10-CM | POA: Diagnosis not present

## 2020-01-22 DIAGNOSIS — R531 Weakness: Secondary | ICD-10-CM | POA: Diagnosis not present

## 2020-01-22 NOTE — Hospital Course (Signed)
Diane Rosario is a 44 y.o. 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   Pt underwent craniotomy for occipital met in May followed by 27 out of 30 prescribed Gy whole brain radiation therapy completed in  Aug.   Pt presented to ED with generalized weakness and new inability to walk. Some pain in buttock area that radiates down legs.  Symptoms onset 1 week ago, progressively worsening, nothing makes better or worse.  Repeated falls initially, now unable to walk. CT head was neg.  Pt transferred to obtain MRI.   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 She was evaluated by oncology and radiation oncology on admission.  Her prognosis was considered extremely poor with no further options of treatment that she could feasibly withstand.  Recommendation was made for the patient to consider hospice and end-of-life care. Palliative care assisted in goals of care discussions with patient and family.  Decision was made for transitioning to residential hospice and plan will be to discharge to [facility] on 01/23/2020.

## 2020-01-22 NOTE — Consult Note (Addendum)
Consultation Note Date: 01/22/2020   Patient Name: Diane Rosario  DOB: 02-Mar-1976  MRN: [id number]  Age / Sex: 44 y.o., female  PCP: Department, [facility] Referring Physician: Dwyane Dee, MD  Reason for Consultation: Establishing goals of care  HPI/Patient Profile: 44 y.o. female  admitted on 01/20/2020   Diane Rosario is a 44 y.o. 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  Pt underwent craniotomy for occipital met in May followed by 27 out of 30 prescribed Gy whole brain radiation therapy completed in  Aug.  Pt presented to ED with generalized weakness and new inability to walk. Some pain in buttock area that radiates down legs.  Symptoms onset 1 week ago, progressively worsening, nothing makes better or worse.  Repeated falls initially, now unable to walk.  Clinical Assessment and Goals of Care:  44 yo lady with metastatic breast cancer and leptomeningeal carcinomatosis  MRI reveals diffuse leptomeningeal metastatic disease with reactive cord edema.  Patient is resting in bed.  She states that her pain is better controlled today.  She appears frail and weak.  She recalls meeting Dr. Lindi Adie her oncologist yesterday.  I introduced myself and palliative care as follows:  Palliative medicine is specialized medical care for people living with serious illness. It focuses on providing relief from the symptoms and stress of a serious illness. The goal is to improve quality of life for both the patient and the family.  Goals of care: Broad aims of medical therapy in relation to the patient's values and preferences. Our aim is to provide medical care aimed at enabling patients to achieve the goals that matter most to them, given the circumstances of their particular medical situation and  their constraints.   Goals wishes and values important to the patient attempted to be explored.  Patient has a little bit of a flat affect.  She is aware of the serious and incurable nature of her illness.  We discussed about what is important to the patient at this stage.  Gave her some information about hospice different levels of hospice home versus residential.  Call placed and discussed with caregiver Juanda Crumble who stated that he has been in touch with the patient's mother and grandmother and his trying to coordinate the patient's care as best as possible.  He is eager to speak with the hospice liaison for further discussions.  See below.  NEXT OF KIN  caregiver Mother Grand mother   SUMMARY OF RECOMMENDATIONS    Code status discussions undertaken with the patient face to face this morning, and also undertaken with the patient's caregiver over the phone: now DNR DNI.  Recommend residential hospice Will request TOC consult.   Code Status/Advance Care Planning:  DNR    Symptom Management:   As above  Palliative Prophylaxis:   Delirium Protocol   Psycho-social/Spiritual:   Desire for further Chaplaincy support:yes  Additional Recommendations: Education on Hospice  Prognosis:   <  2 weeks.   Discharge Planning: Home with Hospice      Primary Diagnoses: Present on Admission: . Cancer of overlapping sites of left female breast ([place]) . Brain metastases ([place]) . Chemotherapy-induced neuropathy ([place]) . Bilateral leg weakness . Metastatic cancer to spine [facility])   I have reviewed the medical record, interviewed the patient and family, and examined the patient. The following aspects are pertinent.  Past Medical History:  Diagnosis Date  . Anxiety   . Bipolar 1 disorder ([place])    pt reports bipolar, pt reports Auditory hallucinations  . COPD (chronic obstructive pulmonary disease) ([place])   . Depression   . Malignant neoplasm of upper-outer quadrant of left breast in  female, estrogen receptor positive ([place]) 11/03/2016  . PTSD (post-traumatic stress disorder)    husband was murdered   Social History   Socioeconomic History  . Marital status: Widowed    Spouse name: Not on file  . Number of children: Not on file  . Years of education: Not on file  . Highest education level: Not on file  Occupational History  . Not on file  Tobacco Use  . Smoking status: Current Every Day Smoker    Packs/day: 0.25    Years: 10.00    Pack years: 2.50    Types: Cigarettes  . Smokeless tobacco: Never Used  Vaping Use  . Vaping Use: Never used  Substance and Sexual Activity  . Alcohol use: Not Currently  . Drug use: Yes    Types: Marijuana, Cocaine, "Crack" cocaine  . Sexual activity: Yes  Other Topics Concern  . Not on file  Social History Narrative  . Not on file   Social Determinants of Health   Financial Resource Strain:   . Difficulty of Paying Living Expenses: Not on file  Food Insecurity:   . Worried About Charity fundraiser in the Last Year: Not on file  . Ran Out of Food in the Last Year: Not on file  Transportation Needs:   . Lack of Transportation (Medical): Not on file  . Lack of Transportation (Non-Medical): Not on file  Physical Activity:   . Days of Exercise per Week: Not on file  . Minutes of Exercise per Session: Not on file  Stress:   . Feeling of Stress : Not on file  Social Connections:   . Frequency of Communication with Friends and Family: Not on file  . Frequency of Social Gatherings with Friends and Family: Not on file  . Attends Religious Services: Not on file  . Active Member of Clubs or Organizations: Not on file  . Attends Archivist Meetings: Not on file  . Marital Status: Not on file   Family History  Problem Relation Age of Onset  . Hypertension Maternal Aunt   . Hypertension Maternal Grandmother   . Cirrhosis Father   . Cancer Father        unknown type   Scheduled Meds: . dexamethasone (DECADRON)  injection  4 mg Intravenous Q6H  . enoxaparin (LOVENOX) injection  30 mg Subcutaneous Daily  . feeding supplement (ENSURE ENLIVE)  237 mL Oral TID BM  . pantoprazole (PROTONIX) IV  40 mg Intravenous Q12H   Continuous Infusions: . sodium chloride 75 mL/hr at 01/21/20 1457   PRN Meds:.acetaminophen **OR** acetaminophen, ALPRAZolam, HYDROcodone-acetaminophen, HYDROmorphone (DILAUDID) injection, ondansetron **OR** ondansetron (ZOFRAN) IV Medications Prior to Admission:  Prior to Admission medications   Medication Sig Start Date End Date Taking? Authorizing Provider  ALPRAZolam Duanne Moron)  1 MG tablet Take 1 mg by mouth at bedtime as needed for anxiety.   Yes [provider]  ENSURE (ENSURE) Take 237 mLs by mouth 3 (three) times daily between meals.    Yes [provider]  HYDROcodone-acetaminophen (NORCO) 5-325 MG tablet Take 1 tablet by mouth every 6 (six) hours as needed for moderate pain. 10/16/19   Kyung Rudd, MD  tamoxifen (NOLVADEX) 20 MG tablet Take 1 tablet (20 mg total) by mouth 2 (two) times daily. Patient not taking: Reported on 09/23/2019 09/19/17   Nicholas Lose, MD   Allergies  Allergen Reactions  . Asa [Aspirin] Hives   Review of Systems Denies shortness of breath, pain is better controlled today Physical Exam Extremely frail and weak appearing lady Generalized weakness Regular work of breathing Abdomen is not distended No edema Muscle wasting S1-S2  Vital Signs: BP 111/74 (BP Location: Right Arm)   Pulse 79   Temp 98.8 F (37.1 C) (Oral)   Resp 14   Ht _0  (1.676 m)   Wt 38.8 kg   SpO2 96%   BMI 13.81 kg/m  Pain Scale: 0-10 POSS *See Group Information*: 1-Acceptable,Awake and alert Pain Score: Asleep   SpO2: SpO2: 96 % O2 Device:SpO2: 96 % O2 Flow Rate: .   IO: Intake/output summary:   Intake/Output Summary (Last 24 hours) at 01/22/2020 1217 Last data filed at 01/22/2020 0538 Gross per 24 hour  Intake 1288 ml  Output 250 ml  Net 1038  ml    LBM: Last BM Date: 01/19/20 Baseline Weight: Weight: 38.8 kg Most recent weight: Weight: 38.8 kg     Palliative Assessment/Data:   PPS 30%  Time In:  11 Time Out:12   Time Total: 60 min  Greater than 50%  of this time was spent counseling and coordinating care related to the above assessment and plan.  Signed by: Loistine Chance, MD   Please contact Palliative Medicine Team phone at [phone] for questions and concerns.  For individual provider: See Shea Evans

## 2020-01-22 NOTE — Progress Notes (Signed)
Manufacturing engineer [facility]) Hospital Liaison note.    Received request from [place] for family interest in [facility]. Chart reviewed and eligibility confirmed. Spoke with caregiver Juanda Crumble to confirm interest and explain services. Family agreeable to transfer tomorrow. TOC aware.    [facility] will notify TOC when registration paperwork has been completed to arrange transport.  Juanda Crumble reports desire to pick up patient to drive her to [facility], hoping to run her by to see her mother and grandmother en route.    Thank you for the opportunity to participate in this patient's care.  Chrislyn Edison Pace, BSN, RN [place] (listed on Energy under Hospice/[facility])    [phone]

## 2020-01-22 NOTE — Progress Notes (Signed)
OT Cancellation Note  Patient Details Name: Diane Rosario MRN: [id number] DOB: [birth year]   Cancelled Treatment:    Reason Eval/Treat Not Completed: Other (comment). GOC look to be transitioning to palliative/Hospice. Will hold today and continue to follow until plans have solidified.   Nicholaus Steinke L Renna Kilmer 01/22/2020, 1:37 PM

## 2020-01-22 NOTE — Progress Notes (Signed)
PROGRESS NOTE    Diane Rosario   ACZ:[id number]  DOB: 11-01-1975  DOA: 01/20/2020     1  PCP: Department, [facility]  CC: cancer  Hospital Course: Diane Rosario is a 44 y.o. 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   Pt underwent craniotomy for occipital met in May followed by 27 out of 30 prescribed Gy whole brain radiation therapy completed in  Aug.   Pt presented to ED with generalized weakness and new inability to walk. Some pain in buttock area that radiates down legs.  Symptoms onset 1 week ago, progressively worsening, nothing makes better or worse.  Repeated falls initially, now unable to walk. CT head was neg.  Pt transferred to obtain MRI.   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 She was evaluated by oncology and radiation oncology on admission.  Her prognosis was considered extremely poor with no further options of treatment that she could feasibly withstand.  Recommendation was made for the patient to consider hospice and end-of-life care. Palliative care assisted in goals of care discussions with patient and family.  Decision was made for transitioning to residential hospice and plan will be to discharge to [facility] on 01/23/2020.   Interval History:  Resting in bed comfortably this am. Denied pain to me. Is aware of diagnosis/prognosis and amenable to hospice at this time.   Old records reviewed in assessment of this patient  ROS: Constitutional: positive for fatigue and malaise, Respiratory: negative for cough, Cardiovascular: negative for chest pain and Gastrointestinal: negative for abdominal pain  Assessment & Plan: 1. 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  Plan is to discharge to [place] on 01/23/2020 -Continue treating patient with comfort meds at this time, targeting pain or nausea if present  Antimicrobials: none  DVT prophylaxis: None Code Status: DNR Family Communication: Palliative care has discussed with family Disposition Plan: Status is: Inpatient  Remains inpatient appropriate because:Ongoing active pain requiring inpatient pain management, Unsafe d/c plan, IV treatments appropriate due to intensity of illness or inability to take PO and Inpatient level of care appropriate due to severity of illness   Dispo: The patient is from: Home              Anticipated d/c is to: Hospice              Anticipated d/c date is: 1 day              Patient currently is not medically stable to d/c.  Objective: Blood pressure 117/85, pulse 77, temperature 98.3 F (36.8 C), temperature source Oral, resp. rate 17, height $RemoveBe'5\' 6"'[id number]$  (1.676 m), weight 38.8 kg, SpO2 95 %.  Examination: General appearance: Cachectic, frail, chronically ill woman lying in bed comfortable and in no obvious distress Head: Normocephalic, without obvious abnormality, atraumatic Eyes: EOMI Lungs: clear to auscultation bilaterally Heart: regular rate and rhythm and S1, S2 normal Abdomen: normal findings: bowel sounds normal and soft, non-tender Extremities: No edema Skin: mobility and turgor normal Neurologic: Follows commands, moves all  4 extremities  Consultants:   Oncology  Radiation oncology  Palliative care  Procedures:   None  Data Reviewed: I have personally reviewed following labs and imaging studies No results found for this or any previous visit (from the past 24 hour(s)).  Recent Results  (from the past 240 hour(s))  Respiratory Panel by RT PCR (Flu A&B, Covid) - Nasopharyngeal Swab     Status: None   Collection Time: 01/20/20  7:55 PM   Specimen: Nasopharyngeal Swab  Result Value Ref Range Status   SARS Coronavirus 2 by RT PCR NEGATIVE NEGATIVE Final    Comment: (NOTE) SARS-CoV-2 target nucleic acids are NOT DETECTED.  The SARS-CoV-2 RNA is generally detectable in upper respiratoy specimens during the acute phase of infection. The lowest concentration of SARS-CoV-2 viral copies this assay can detect is 131 copies/mL. A negative result does not preclude SARS-Cov-2 infection and should not be used as the sole basis for treatment or other patient management decisions. A negative result may occur with  improper specimen collection/handling, submission of specimen other than nasopharyngeal swab, presence of viral mutation(s) within the areas targeted by this assay, and inadequate number of viral copies (<131 copies/mL). A negative result must be combined with clinical observations, patient history, and epidemiological information. The expected result is Negative.  Fact Sheet for Patients:  PinkCheek.be  Fact Sheet for Healthcare Providers:  GravelBags.it  This test is no t yet approved or cleared by the [place] FDA and  has been authorized for detection and/or diagnosis of SARS-CoV-2 by FDA under an Emergency Use Authorization (EUA). This EUA will remain  in effect (meaning this test can be used) for the duration of the COVID-19 declaration under Section 564(b)(1) of the Act, 21 U.S.C. section 360bbb-3(b)(1), unless the authorization is terminated or revoked sooner.     Influenza A by PCR NEGATIVE NEGATIVE Final   Influenza B by PCR NEGATIVE NEGATIVE Final    Comment: (NOTE) The Xpert Xpress SARS-CoV-2/FLU/RSV assay is intended as an aid in  the diagnosis of influenza from Nasopharyngeal swab specimens  and  should not be used as a sole basis for treatment. Nasal washings and  aspirates are unacceptable for Xpert Xpress SARS-CoV-2/FLU/RSV  testing.  Fact Sheet for Patients: PinkCheek.be  Fact Sheet for Healthcare Providers: GravelBags.it  This test is not yet approved or cleared by the [place] FDA and  has been authorized for detection and/or diagnosis of SARS-CoV-2 by  FDA under an Emergency Use Authorization (EUA). This EUA will remain  in effect (meaning this test can be used) for the duration of the  Covid-19 declaration under Section 564(b)(1) of the Act, 21  U.S.C. section 360bbb-3(b)(1), unless the authorization is  terminated or revoked. Performed at [facility], [place] [address], [place], [place] [address]      Radiology Studies: CT Head Wo Contrast  Result Date: 01/20/2020 CLINICAL DATA:  44 year old female with leg weakness. History of breast cancer with metastasis to brain. EXAM: CT HEAD WITHOUT CONTRAST TECHNIQUE: Contiguous axial images were obtained from the base of the skull through the vertex without intravenous contrast. COMPARISON:  Brain MRI dated 09/05/2019. FINDINGS: Evaluation of this exam is limited due to motion artifact. Brain: The ventricles and sulci appropriate size for patient's age. There is an area of low attenuation in the right cerebellar hemisphere most consistent with edema and likely postsurgical. There is no acute intracranial hemorrhage. No mass effect or midline shift. No extra-axial fluid collection. Vascular: No hyperdense  vessel or unexpected calcification. Skull: Right occipital craniectomy. No acute calvarial pathology. Sinuses/Orbits: No acute finding. Other: None IMPRESSION: 1. No acute intracranial pathology. 2. Postsurgical changes of right occipital lobe. Electronically Signed   By: Anner Crete M.D.   On: 01/20/2020 20:28   MR BRAIN W WO  CONTRAST  Result Date: 01/21/2020 CLINICAL DATA:  Generalized weakness with new inability to walk. Recent falls. History of metastatic breast cancer. EXAM: MRI HEAD WITHOUT AND WITH CONTRAST TECHNIQUE: Multiplanar, multiecho pulse sequences of the brain and surrounding structures were obtained without and with intravenous contrast. CONTRAST:  53mL GADAVIST GADOBUTROL 1 MMOL/ML IV SOLN COMPARISON:  Head CT 01/20/2020 and MRI 09/05/2019 FINDINGS: Brain: Sequelae of suboccipital craniectomy are again identified for resection of a right cerebellar metastasis. A heterogeneously enhancing mass at the resection site along the posterior margin of the right cerebellar hemisphere measures 3.7 x 1.5 x 2.1 cm. This mass is contiguous with nodular dural thickening of the right tentorium, and there is milder thickening of the left tentorium. There is also partly nodular enhancement along the inferior cerebellar folia on the right. There is moderate right cerebellar edema. The fourth ventricle remains patent. Hydrocephalus has improved from the prior MRI, with the lateral and third ventricles now only slightly dilated and with resolution of transependymal edema. There is FLAIR hyperintensity within medial parieto-occipital sulci bilaterally with possible faint enhancement which may reflect leptomeningeal disease, and there is a small amount of nodular enhancement anteriorly in the left middle cranial fossa likely reflecting leptomeningeal disease. No acute infarct, intracranial hemorrhage, midline shift, or sizable extra-axial fluid collection is identified. There is mild cerebral atrophy. Vascular: Major intracranial vascular flow voids are preserved. Skull and upper cervical spine: Unremarkable bone marrow signal. Sinuses/Orbits: Unremarkable orbits. Minimal mucosal thickening in the left maxillary sinus. Mild mucosal edema/trace fluid in the mastoid air cells, right greater than left. Other: None. 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 Electronically Signed   By: Logan Bores M.D.   On: 01/21/2020 10:01   MR Cervical Spine W or Wo Contrast  Result Date: 01/21/2020 CLINICAL DATA:  Diffuse weakness EXAM: MRI CERVICAL, THORACIC AND LUMBAR SPINE WITHOUT AND WITH CONTRAST TECHNIQUE: Multiplanar and multiecho pulse sequences of the cervical spine, to include the craniocervical junction and cervicothoracic junction, and thoracic and lumbar spine, were obtained without and with intravenous contrast. CONTRAST:  57mL GADAVIST GADOBUTROL 1 MMOL/ML IV SOLN COMPARISON:  None. FINDINGS: MRI CERVICAL SPINE FINDINGS Alignment: Physiologic. Vertebrae: No fracture, evidence of discitis, or bone lesion. Cord: There is diffuse leptomeningeal contrast enhancement along the surface of the cervical spinal cord. Additionally, there is intraparenchymal signal abnormality at the C4-7 levels. Posterior Fossa, vertebral arteries, paraspinal tissues: Incompletely visualized right cerebellar tumor with extension along the right leaflet of the tentorium cerebelli. Disc levels: No significant disc herniation or spinal canal stenosis. MRI THORACIC SPINE FINDINGS Alignment:  Physiologic. Vertebrae: No fracture, evidence of discitis, or bone lesion. Cord: Diffuse leptomeningeal contrast enhancement primarily along the dorsal surface of the thoracic spinal cord. There is intraparenchymal hyperintense T2-weighted signal that extends from the cervicothoracic junction to the T10 level. Paraspinal and other soft tissues: Negative Disc levels: No disc herniation or spinal canal stenosis. MRI LUMBAR SPINE FINDINGS Segmentation:  Standard. Alignment:  Physiologic. Vertebrae:  No fracture, evidence of discitis, or bone lesion. Conus medullaris and cauda equina: Conus extends to the L1 level. There is clumping  of the cauda equina nerve roots with multifocal  abnormal contrast enhancement. Paraspinal and other soft tissues: Negative Disc levels: No disc herniation, spinal canal stenosis or neural foraminal stenosis. IMPRESSION: 1. Diffuse leptomeningeal contrast enhancement along the surface of the cervical and thoracic spinal cord and the cauda equina nerve roots. The findings are most consistent with leptomeningeal metastatic disease. 2. Intraparenchymal signal abnormality within the cervical and thoracic spinal cord is likely reactive edema due to the leptomeningeal disease. 3. Incompletely visualized right cerebellar tumor with extension along the right leaflet of the tentorium cerebelli. MRI of the brain with without contrast should be considered for more complete characterization. Electronically Signed   By: Ulyses Jarred M.D.   On: 01/21/2020 03:42   MR THORACIC SPINE W WO CONTRAST  Result Date: 01/21/2020 CLINICAL DATA:  Diffuse weakness EXAM: MRI CERVICAL, THORACIC AND LUMBAR SPINE WITHOUT AND WITH CONTRAST TECHNIQUE: Multiplanar and multiecho pulse sequences of the cervical spine, to include the craniocervical junction and cervicothoracic junction, and thoracic and lumbar spine, were obtained without and with intravenous contrast. CONTRAST:  16mL GADAVIST GADOBUTROL 1 MMOL/ML IV SOLN COMPARISON:  None. FINDINGS: MRI CERVICAL SPINE FINDINGS Alignment: Physiologic. Vertebrae: No fracture, evidence of discitis, or bone lesion. Cord: There is diffuse leptomeningeal contrast enhancement along the surface of the cervical spinal cord. Additionally, there is intraparenchymal signal abnormality at the C4-7 levels. Posterior Fossa, vertebral arteries, paraspinal tissues: Incompletely visualized right cerebellar tumor with extension along the right leaflet of the tentorium cerebelli. Disc levels: No significant disc herniation or spinal canal stenosis. MRI THORACIC SPINE FINDINGS Alignment:  Physiologic. Vertebrae: No fracture, evidence of discitis, or bone lesion.  Cord: Diffuse leptomeningeal contrast enhancement primarily along the dorsal surface of the thoracic spinal cord. There is intraparenchymal hyperintense T2-weighted signal that extends from the cervicothoracic junction to the T10 level. Paraspinal and other soft tissues: Negative Disc levels: No disc herniation or spinal canal stenosis. MRI LUMBAR SPINE FINDINGS Segmentation:  Standard. Alignment:  Physiologic. Vertebrae:  No fracture, evidence of discitis, or bone lesion. Conus medullaris and cauda equina: Conus extends to the L1 level. There is clumping of the cauda equina nerve roots with multifocal abnormal contrast enhancement. Paraspinal and other soft tissues: Negative Disc levels: No disc herniation, spinal canal stenosis or neural foraminal stenosis. IMPRESSION: 1. Diffuse leptomeningeal contrast enhancement along the surface of the cervical and thoracic spinal cord and the cauda equina nerve roots. The findings are most consistent with leptomeningeal metastatic disease. 2. Intraparenchymal signal abnormality within the cervical and thoracic spinal cord is likely reactive edema due to the leptomeningeal disease. 3. Incompletely visualized right cerebellar tumor with extension along the right leaflet of the tentorium cerebelli. MRI of the brain with without contrast should be considered for more complete characterization. Electronically Signed   By: Ulyses Jarred M.D.   On: 01/21/2020 03:42   MR Lumbar Spine W Wo Contrast  Result Date: 01/21/2020 CLINICAL DATA:  Diffuse weakness EXAM: MRI CERVICAL, THORACIC AND LUMBAR SPINE WITHOUT AND WITH CONTRAST TECHNIQUE: Multiplanar and multiecho pulse sequences of the cervical spine, to include the craniocervical junction and cervicothoracic junction, and thoracic and lumbar spine, were obtained without and with intravenous contrast. CONTRAST:  62mL GADAVIST GADOBUTROL 1 MMOL/ML IV SOLN COMPARISON:  None. FINDINGS: MRI CERVICAL SPINE FINDINGS Alignment:  Physiologic. Vertebrae: No fracture, evidence of discitis, or bone lesion. Cord: There is diffuse leptomeningeal contrast enhancement along the surface of the cervical spinal cord. Additionally, there is intraparenchymal signal abnormality at the C4-7 levels.  Posterior Fossa, vertebral arteries, paraspinal tissues: Incompletely visualized right cerebellar tumor with extension along the right leaflet of the tentorium cerebelli. Disc levels: No significant disc herniation or spinal canal stenosis. MRI THORACIC SPINE FINDINGS Alignment:  Physiologic. Vertebrae: No fracture, evidence of discitis, or bone lesion. Cord: Diffuse leptomeningeal contrast enhancement primarily along the dorsal surface of the thoracic spinal cord. There is intraparenchymal hyperintense T2-weighted signal that extends from the cervicothoracic junction to the T10 level. Paraspinal and other soft tissues: Negative Disc levels: No disc herniation or spinal canal stenosis. MRI LUMBAR SPINE FINDINGS Segmentation:  Standard. Alignment:  Physiologic. Vertebrae:  No fracture, evidence of discitis, or bone lesion. Conus medullaris and cauda equina: Conus extends to the L1 level. There is clumping of the cauda equina nerve roots with multifocal abnormal contrast enhancement. Paraspinal and other soft tissues: Negative Disc levels: No disc herniation, spinal canal stenosis or neural foraminal stenosis. IMPRESSION: 1. Diffuse leptomeningeal contrast enhancement along the surface of the cervical and thoracic spinal cord and the cauda equina nerve roots. The findings are most consistent with leptomeningeal metastatic disease. 2. Intraparenchymal signal abnormality within the cervical and thoracic spinal cord is likely reactive edema due to the leptomeningeal disease. 3. Incompletely visualized right cerebellar tumor with extension along the right leaflet of the tentorium cerebelli. MRI of the brain with without contrast should be considered for more complete  characterization. Electronically Signed   By: Ulyses Jarred M.D.   On: 01/21/2020 03:42   MR BRAIN W WO CONTRAST  Final Result    MR Cervical Spine W or Wo Contrast  Final Result    MR Lumbar Spine W Wo Contrast  Final Result    MR THORACIC SPINE W WO CONTRAST  Final Result    CT Head Wo Contrast  Final Result      Scheduled Meds: . dexamethasone (DECADRON) injection  4 mg Intravenous Q6H  . enoxaparin (LOVENOX) injection  30 mg Subcutaneous Daily  . feeding supplement  237 mL Oral TID BM  . pantoprazole (PROTONIX) IV  40 mg Intravenous Q12H   PRN Meds: acetaminophen **OR** acetaminophen, ALPRAZolam, HYDROcodone-acetaminophen, HYDROmorphone (DILAUDID) injection, ondansetron **OR** ondansetron (ZOFRAN) IV Continuous Infusions: . sodium chloride 75 mL/hr at 01/22/20 1650      LOS: 1 day  Time spent: Greater than 50% of the 35 minute visit was spent in counseling/coordination of care for the patient as laid out in the A&P.   Dwyane Dee, MD [facility] 01/22/2020, 5:47 PM

## 2020-01-23 DIAGNOSIS — R531 Weakness: Secondary | ICD-10-CM | POA: Diagnosis not present

## 2020-01-23 DIAGNOSIS — Z7189 Other specified counseling: Secondary | ICD-10-CM | POA: Diagnosis not present

## 2020-01-23 DIAGNOSIS — Z515 Encounter for palliative care: Secondary | ICD-10-CM | POA: Diagnosis not present

## 2020-01-23 DIAGNOSIS — C7951 Secondary malignant neoplasm of bone: Secondary | ICD-10-CM | POA: Diagnosis not present

## 2020-01-23 NOTE — Progress Notes (Deleted)
Manufacturing engineer [facility]) Hospital Liaison Note       ACC received a referral from Dr. Rowe Pavy, Larence Penning PMT.  Brief chart review shows pt currently with [facility].  TOC made aware.    Domenic Moras, BSN, RN [facility] Liaison   [phone] (24h on call)

## 2020-01-23 NOTE — Progress Notes (Signed)
Report called to Occupational hygienist at [facility].

## 2020-01-23 NOTE — Discharge Summary (Signed)
Physician Discharge Summary  Diane Rosario RUB:[id number] DOB: 04-Nov-1975 DOA: 01/20/2020  PCP: Department, [facility]  Admit date: 01/20/2020 Discharge date: 01/23/2020  Admitted From: home Disposition:  [facility] Discharging physician: Lewie Chamber, MD   Patient discharged to [facility] in Discharge Condition: poor CODE STATUS: DNR/comfort care Diet recommendation:  Diet Orders (From admission, onward)    Start     Ordered   01/23/20 0000  Diet general        01/23/20 1124   01/21/20 0431  Diet regular Room service appropriate? Yes; Fluid consistency: Thin  Diet effective now       Question Answer Comment  Room service appropriate? Yes   Fluid consistency: Thin      01/21/20 0431          Hospital Course: Diane Rosario is a 44 y.o. 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   Pt underwent craniotomy for occipital met in May followed by 27 out of 30 prescribed Gy whole brain radiation therapy completed in  Aug.   Pt presented to ED with generalized weakness and new inability to walk. Some pain in buttock area that radiates down legs.  Symptoms onset 1 week ago, progressively worsening, nothing makes better or worse.  Repeated falls initially, now unable to walk. CT head was neg.  Pt transferred to obtain MRI.   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 She was evaluated by oncology and radiation oncology on admission.  Her prognosis was considered extremely poor with no further options of treatment that she could feasibly withstand.  Recommendation was made for the patient to consider hospice and end-of-life care. Palliative care assisted in goals of care discussions with patient and  family.  Decision was made for transitioning to residential hospice and plan will be to discharge to [facility] on 01/23/2020.   Discharge Diagnoses:   Principal Diagnosis: Metastatic cancer to spine [facility])  Active Hospital Problems   Diagnosis Date Noted  . Metastatic cancer to spine (HCC) 01/21/2020  . Bilateral leg weakness 01/21/2020  . Brain metastases (HCC) 10/02/2019  . Chemotherapy-induced neuropathy (HCC) 11/21/2017  . Cancer of overlapping sites of left female breast (HCC) 12/14/2016  . Malignant neoplasm of upper-outer quadrant of left breast in female, estrogen receptor positive (HCC) 11/03/2016    Resolved Hospital Problems  No resolved problems to display.    Discharge Instructions    Diet general   Complete by: As directed      Allergies as of 01/23/2020      Reactions   Asa [aspirin] Hives      Medication List    STOP taking these medications   ALPRAZolam 1 MG tablet Commonly known as: XANAX   Ensure   HYDROcodone-acetaminophen 5-325 MG tablet Commonly known as: Norco   tamoxifen 20 MG tablet Commonly known as: NOLVADEX       Allergies  Allergen Reactions  . Asa [Aspirin] Hives    Consultations: Oncology Rad onc Palliative care  Discharge Exam: BP 106/69 (BP Location: Right Arm)   Pulse 70   Temp 97.8 F (36.6 C) (Oral)   Resp 14   Ht 5\' 6"  (1.676 m)   Wt 38.8 kg   SpO2 96%   BMI 13.81 kg/m  General appearance: Cachectic, frail, chronically ill woman  lying in bed comfortable and in no obvious distress Head: Normocephalic, without obvious abnormality, atraumatic Eyes: EOMI Lungs: clear to auscultation bilaterally Heart: regular rate and rhythm and S1, S2 normal Abdomen: normal findings: bowel sounds normal and soft, non-tender Extremities: No edema Skin: mobility and turgor normal Neurologic: Follows commands, moves all 4 extremities  The results of significant diagnostics from this hospitalization (including imaging,  microbiology, ancillary and laboratory) are listed below for reference.   Microbiology: Recent Results (from the past 240 hour(s))  Respiratory Panel by RT PCR (Flu A&B, Covid) - Nasopharyngeal Swab     Status: None   Collection Time: 01/20/20  7:55 PM   Specimen: Nasopharyngeal Swab  Result Value Ref Range Status   SARS Coronavirus 2 by RT PCR NEGATIVE NEGATIVE Final    Comment: (NOTE) SARS-CoV-2 target nucleic acids are NOT DETECTED.  The SARS-CoV-2 RNA is generally detectable in upper respiratoy specimens during the acute phase of infection. The lowest concentration of SARS-CoV-2 viral copies this assay can detect is 131 copies/mL. A negative result does not preclude SARS-Cov-2 infection and should not be used as the sole basis for treatment or other patient management decisions. A negative result may occur with  improper specimen collection/handling, submission of specimen other than nasopharyngeal swab, presence of viral mutation(s) within the areas targeted by this assay, and inadequate number of viral copies (<131 copies/mL). A negative result must be combined with clinical observations, patient history, and epidemiological information. The expected result is Negative.  Fact Sheet for Patients:  PinkCheek.be  Fact Sheet for Healthcare Providers:  GravelBags.it  This test is no t yet approved or cleared by the [place] FDA and  has been authorized for detection and/or diagnosis of SARS-CoV-2 by FDA under an Emergency Use Authorization (EUA). This EUA will remain  in effect (meaning this test can be used) for the duration of the COVID-19 declaration under Section 564(b)(1) of the Act, 21 U.S.C. section 360bbb-3(b)(1), unless the authorization is terminated or revoked sooner.     Influenza A by PCR NEGATIVE NEGATIVE Final   Influenza B by PCR NEGATIVE NEGATIVE Final    Comment: (NOTE) The Xpert Xpress  SARS-CoV-2/FLU/RSV assay is intended as an aid in  the diagnosis of influenza from Nasopharyngeal swab specimens and  should not be used as a sole basis for treatment. Nasal washings and  aspirates are unacceptable for Xpert Xpress SARS-CoV-2/FLU/RSV  testing.  Fact Sheet for Patients: PinkCheek.be  Fact Sheet for Healthcare Providers: GravelBags.it  This test is not yet approved or cleared by the [place] FDA and  has been authorized for detection and/or diagnosis of SARS-CoV-2 by  FDA under an Emergency Use Authorization (EUA). This EUA will remain  in effect (meaning this test can be used) for the duration of the  Covid-19 declaration under Section 564(b)(1) of the Act, 21  U.S.C. section 360bbb-3(b)(1), unless the authorization is  terminated or revoked. Performed at [facility], [place] [address], [place], [place] [address]      Labs: BNP (last 3 results) No results for input(s): BNP in the last 8760 hours. Basic Metabolic Panel: Recent Labs  Lab 01/20/20 1954  NA 142  K 4.2  CL 99  CO2 30  GLUCOSE 78  BUN 21*  CREATININE 0.86  CALCIUM 10.1   Liver Function Tests: Recent Labs  Lab 01/20/20 1954  AST 28  ALT 20  ALKPHOS 54  BILITOT 0.6  PROT 8.0  ALBUMIN 4.2   No results  for input(s): LIPASE, AMYLASE in the last 168 hours. No results for input(s): AMMONIA in the last 168 hours. CBC: Recent Labs  Lab 01/20/20 1954  WBC 4.5  NEUTROABS 2.5  HGB 13.9  HCT 41.9  MCV 98.6  PLT 336   Cardiac Enzymes: No results for input(s): CKTOTAL, CKMB, CKMBINDEX, TROPONINI in the last 168 hours. BNP: Invalid input(s): POCBNP CBG: Recent Labs  Lab 01/20/20 1826  GLUCAP 128*   D-Dimer No results for input(s): DDIMER in the last 72 hours. Hgb A1c No results for input(s): HGBA1C in the last 72 hours. Lipid Profile No results for input(s): CHOL, HDL, LDLCALC, TRIG, CHOLHDL,  LDLDIRECT in the last 72 hours. Thyroid function studies No results for input(s): TSH, T4TOTAL, T3FREE, THYROIDAB in the last 72 hours.  Invalid input(s): FREET3 Anemia work up No results for input(s): VITAMINB12, FOLATE, FERRITIN, TIBC, IRON, RETICCTPCT in the last 72 hours. Urinalysis    Component Value Date/Time   COLORURINE YELLOW 01/21/2020 1303   APPEARANCEUR CLOUDY (A) 01/21/2020 1303   LABSPEC >1.030 (H) 01/21/2020 1303   PHURINE 6.0 01/21/2020 1303   GLUCOSEU 100 (A) 01/21/2020 1303   HGBUR MODERATE (A) 01/21/2020 1303   BILIRUBINUR MODERATE (A) 01/21/2020 1303   KETONESUR NEGATIVE 01/21/2020 1303   PROTEINUR 30 (A) 01/21/2020 1303   UROBILINOGEN 0.2 09/05/2012 1632   NITRITE POSITIVE (A) 01/21/2020 1303   LEUKOCYTESUR NEGATIVE 01/21/2020 1303   Sepsis Labs Invalid input(s): PROCALCITONIN,  WBC,  LACTICIDVEN Microbiology Recent Results (from the past 240 hour(s))  Respiratory Panel by RT PCR (Flu A&B, Covid) - Nasopharyngeal Swab     Status: None   Collection Time: 01/20/20  7:55 PM   Specimen: Nasopharyngeal Swab  Result Value Ref Range Status   SARS Coronavirus 2 by RT PCR NEGATIVE NEGATIVE Final    Comment: (NOTE) SARS-CoV-2 target nucleic acids are NOT DETECTED.  The SARS-CoV-2 RNA is generally detectable in upper respiratoy specimens during the acute phase of infection. The lowest concentration of SARS-CoV-2 viral copies this assay can detect is 131 copies/mL. A negative result does not preclude SARS-Cov-2 infection and should not be used as the sole basis for treatment or other patient management decisions. A negative result may occur with  improper specimen collection/handling, submission of specimen other than nasopharyngeal swab, presence of viral mutation(s) within the areas targeted by this assay, and inadequate number of viral copies (<131 copies/mL). A negative result must be combined with clinical observations, patient history, and epidemiological  information. The expected result is Negative.  Fact Sheet for Patients:  PinkCheek.be  Fact Sheet for Healthcare Providers:  GravelBags.it  This test is no t yet approved or cleared by the [place] FDA and  has been authorized for detection and/or diagnosis of SARS-CoV-2 by FDA under an Emergency Use Authorization (EUA). This EUA will remain  in effect (meaning this test can be used) for the duration of the COVID-19 declaration under Section 564(b)(1) of the Act, 21 U.S.C. section 360bbb-3(b)(1), unless the authorization is terminated or revoked sooner.     Influenza A by PCR NEGATIVE NEGATIVE Final   Influenza B by PCR NEGATIVE NEGATIVE Final    Comment: (NOTE) The Xpert Xpress SARS-CoV-2/FLU/RSV assay is intended as an aid in  the diagnosis of influenza from Nasopharyngeal swab specimens and  should not be used as a sole basis for treatment. Nasal washings and  aspirates are unacceptable for Xpert Xpress SARS-CoV-2/FLU/RSV  testing.  Fact Sheet for Patients: PinkCheek.be  Fact Sheet for Healthcare  Providers: GravelBags.it  This test is not yet approved or cleared by the [place] and  has been authorized for detection and/or diagnosis of SARS-CoV-2 by  FDA under an Emergency Use Authorization (EUA). This EUA will remain  in effect (meaning this test can be used) for the duration of the  Covid-19 declaration under Section 564(b)(1) of the Act, 21  U.S.C. section 360bbb-3(b)(1), unless the authorization is  terminated or revoked. Performed at [facility], [place] [address], [place], [place] [address]     Procedures/Studies: CT Head Wo Contrast  Result Date: 01/20/2020 CLINICAL DATA:  44 year old female with leg weakness. History of breast cancer with metastasis to brain. EXAM: CT HEAD WITHOUT CONTRAST TECHNIQUE: Contiguous  axial images were obtained from the base of the skull through the vertex without intravenous contrast. COMPARISON:  Brain MRI dated 09/05/2019. FINDINGS: Evaluation of this exam is limited due to motion artifact. Brain: The ventricles and sulci appropriate size for patient's age. There is an area of low attenuation in the right cerebellar hemisphere most consistent with edema and likely postsurgical. There is no acute intracranial hemorrhage. No mass effect or midline shift. No extra-axial fluid collection. Vascular: No hyperdense vessel or unexpected calcification. Skull: Right occipital craniectomy. No acute calvarial pathology. Sinuses/Orbits: No acute finding. Other: None IMPRESSION: 1. No acute intracranial pathology. 2. Postsurgical changes of right occipital lobe. Electronically Signed   By: Anner Crete M.D.   On: 01/20/2020 20:28   MR BRAIN W WO CONTRAST  Result Date: 01/21/2020 CLINICAL DATA:  Generalized weakness with new inability to walk. Recent falls. History of metastatic breast cancer. EXAM: MRI HEAD WITHOUT AND WITH CONTRAST TECHNIQUE: Multiplanar, multiecho pulse sequences of the brain and surrounding structures were obtained without and with intravenous contrast. CONTRAST:  48mL GADAVIST GADOBUTROL 1 MMOL/ML IV SOLN COMPARISON:  Head CT 01/20/2020 and MRI 09/05/2019 FINDINGS: Brain: Sequelae of suboccipital craniectomy are again identified for resection of a right cerebellar metastasis. A heterogeneously enhancing mass at the resection site along the posterior margin of the right cerebellar hemisphere measures 3.7 x 1.5 x 2.1 cm. This mass is contiguous with nodular dural thickening of the right tentorium, and there is milder thickening of the left tentorium. There is also partly nodular enhancement along the inferior cerebellar folia on the right. There is moderate right cerebellar edema. The fourth ventricle remains patent. Hydrocephalus has improved from the prior MRI, with the lateral  and third ventricles now only slightly dilated and with resolution of transependymal edema. There is FLAIR hyperintensity within medial parieto-occipital sulci bilaterally with possible faint enhancement which may reflect leptomeningeal disease, and there is a small amount of nodular enhancement anteriorly in the left middle cranial fossa likely reflecting leptomeningeal disease. No acute infarct, intracranial hemorrhage, midline shift, or sizable extra-axial fluid collection is identified. There is mild cerebral atrophy. Vascular: Major intracranial vascular flow voids are preserved. Skull and upper cervical spine: Unremarkable bone marrow signal. Sinuses/Orbits: Unremarkable orbits. Minimal mucosal thickening in the left maxillary sinus. Mild mucosal edema/trace fluid in the mastoid air cells, right greater than left. Other: None. 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 Electronically Signed   By: Logan Bores M.D.   On: 01/21/2020 10:01   MR Cervical Spine W or Wo Contrast  Result Date: 01/21/2020 CLINICAL DATA:  Diffuse weakness EXAM: MRI CERVICAL, THORACIC AND LUMBAR SPINE WITHOUT AND WITH CONTRAST TECHNIQUE: Multiplanar and multiecho  pulse sequences of the cervical spine, to include the craniocervical junction and cervicothoracic junction, and thoracic and lumbar spine, were obtained without and with intravenous contrast. CONTRAST:  73mL GADAVIST GADOBUTROL 1 MMOL/ML IV SOLN COMPARISON:  None. FINDINGS: MRI CERVICAL SPINE FINDINGS Alignment: Physiologic. Vertebrae: No fracture, evidence of discitis, or bone lesion. Cord: There is diffuse leptomeningeal contrast enhancement along the surface of the cervical spinal cord. Additionally, there is intraparenchymal signal abnormality at the C4-7 levels. Posterior Fossa, vertebral arteries, paraspinal tissues: Incompletely visualized right cerebellar  tumor with extension along the right leaflet of the tentorium cerebelli. Disc levels: No significant disc herniation or spinal canal stenosis. MRI THORACIC SPINE FINDINGS Alignment:  Physiologic. Vertebrae: No fracture, evidence of discitis, or bone lesion. Cord: Diffuse leptomeningeal contrast enhancement primarily along the dorsal surface of the thoracic spinal cord. There is intraparenchymal hyperintense T2-weighted signal that extends from the cervicothoracic junction to the T10 level. Paraspinal and other soft tissues: Negative Disc levels: No disc herniation or spinal canal stenosis. MRI LUMBAR SPINE FINDINGS Segmentation:  Standard. Alignment:  Physiologic. Vertebrae:  No fracture, evidence of discitis, or bone lesion. Conus medullaris and cauda equina: Conus extends to the L1 level. There is clumping of the cauda equina nerve roots with multifocal abnormal contrast enhancement. Paraspinal and other soft tissues: Negative Disc levels: No disc herniation, spinal canal stenosis or neural foraminal stenosis. IMPRESSION: 1. Diffuse leptomeningeal contrast enhancement along the surface of the cervical and thoracic spinal cord and the cauda equina nerve roots. The findings are most consistent with leptomeningeal metastatic disease. 2. Intraparenchymal signal abnormality within the cervical and thoracic spinal cord is likely reactive edema due to the leptomeningeal disease. 3. Incompletely visualized right cerebellar tumor with extension along the right leaflet of the tentorium cerebelli. MRI of the brain with without contrast should be considered for more complete characterization. Electronically Signed   By: Ulyses Jarred M.D.   On: 01/21/2020 03:42   MR THORACIC SPINE W WO CONTRAST  Result Date: 01/21/2020 CLINICAL DATA:  Diffuse weakness EXAM: MRI CERVICAL, THORACIC AND LUMBAR SPINE WITHOUT AND WITH CONTRAST TECHNIQUE: Multiplanar and multiecho pulse sequences of the cervical spine, to include the  craniocervical junction and cervicothoracic junction, and thoracic and lumbar spine, were obtained without and with intravenous contrast. CONTRAST:  60mL GADAVIST GADOBUTROL 1 MMOL/ML IV SOLN COMPARISON:  None. FINDINGS: MRI CERVICAL SPINE FINDINGS Alignment: Physiologic. Vertebrae: No fracture, evidence of discitis, or bone lesion. Cord: There is diffuse leptomeningeal contrast enhancement along the surface of the cervical spinal cord. Additionally, there is intraparenchymal signal abnormality at the C4-7 levels. Posterior Fossa, vertebral arteries, paraspinal tissues: Incompletely visualized right cerebellar tumor with extension along the right leaflet of the tentorium cerebelli. Disc levels: No significant disc herniation or spinal canal stenosis. MRI THORACIC SPINE FINDINGS Alignment:  Physiologic. Vertebrae: No fracture, evidence of discitis, or bone lesion. Cord: Diffuse leptomeningeal contrast enhancement primarily along the dorsal surface of the thoracic spinal cord. There is intraparenchymal hyperintense T2-weighted signal that extends from the cervicothoracic junction to the T10 level. Paraspinal and other soft tissues: Negative Disc levels: No disc herniation or spinal canal stenosis. MRI LUMBAR SPINE FINDINGS Segmentation:  Standard. Alignment:  Physiologic. Vertebrae:  No fracture, evidence of discitis, or bone lesion. Conus medullaris and cauda equina: Conus extends to the L1 level. There is clumping of the cauda equina nerve roots with multifocal abnormal contrast enhancement. Paraspinal and other soft tissues: Negative Disc levels: No disc herniation, spinal canal stenosis or neural foraminal stenosis. IMPRESSION:  1. Diffuse leptomeningeal contrast enhancement along the surface of the cervical and thoracic spinal cord and the cauda equina nerve roots. The findings are most consistent with leptomeningeal metastatic disease. 2. Intraparenchymal signal abnormality within the cervical and thoracic spinal  cord is likely reactive edema due to the leptomeningeal disease. 3. Incompletely visualized right cerebellar tumor with extension along the right leaflet of the tentorium cerebelli. MRI of the brain with without contrast should be considered for more complete characterization. Electronically Signed   By: Ulyses Jarred M.D.   On: 01/21/2020 03:42   MR Lumbar Spine W Wo Contrast  Result Date: 01/21/2020 CLINICAL DATA:  Diffuse weakness EXAM: MRI CERVICAL, THORACIC AND LUMBAR SPINE WITHOUT AND WITH CONTRAST TECHNIQUE: Multiplanar and multiecho pulse sequences of the cervical spine, to include the craniocervical junction and cervicothoracic junction, and thoracic and lumbar spine, were obtained without and with intravenous contrast. CONTRAST:  42mL GADAVIST GADOBUTROL 1 MMOL/ML IV SOLN COMPARISON:  None. FINDINGS: MRI CERVICAL SPINE FINDINGS Alignment: Physiologic. Vertebrae: No fracture, evidence of discitis, or bone lesion. Cord: There is diffuse leptomeningeal contrast enhancement along the surface of the cervical spinal cord. Additionally, there is intraparenchymal signal abnormality at the C4-7 levels. Posterior Fossa, vertebral arteries, paraspinal tissues: Incompletely visualized right cerebellar tumor with extension along the right leaflet of the tentorium cerebelli. Disc levels: No significant disc herniation or spinal canal stenosis. MRI THORACIC SPINE FINDINGS Alignment:  Physiologic. Vertebrae: No fracture, evidence of discitis, or bone lesion. Cord: Diffuse leptomeningeal contrast enhancement primarily along the dorsal surface of the thoracic spinal cord. There is intraparenchymal hyperintense T2-weighted signal that extends from the cervicothoracic junction to the T10 level. Paraspinal and other soft tissues: Negative Disc levels: No disc herniation or spinal canal stenosis. MRI LUMBAR SPINE FINDINGS Segmentation:  Standard. Alignment:  Physiologic. Vertebrae:  No fracture, evidence of discitis, or  bone lesion. Conus medullaris and cauda equina: Conus extends to the L1 level. There is clumping of the cauda equina nerve roots with multifocal abnormal contrast enhancement. Paraspinal and other soft tissues: Negative Disc levels: No disc herniation, spinal canal stenosis or neural foraminal stenosis. IMPRESSION: 1. Diffuse leptomeningeal contrast enhancement along the surface of the cervical and thoracic spinal cord and the cauda equina nerve roots. The findings are most consistent with leptomeningeal metastatic disease. 2. Intraparenchymal signal abnormality within the cervical and thoracic spinal cord is likely reactive edema due to the leptomeningeal disease. 3. Incompletely visualized right cerebellar tumor with extension along the right leaflet of the tentorium cerebelli. MRI of the brain with without contrast should be considered for more complete characterization. Electronically Signed   By: Ulyses Jarred M.D.   On: 01/21/2020 03:42     Time coordinating discharge: Over 30 minutes    Dwyane Dee, MD  [facility] 01/23/2020, 11:28 AM

## 2020-01-23 NOTE — TOC Initial Note (Addendum)
Transition of Care [facility]) - Initial/Assessment Note    Patient Details  Name: Diane Rosario MRN: [id number] Date of Birth: Feb 22, 1976  Transition of Care [facility]) CM/SW Contact:    Lynnell Catalan, RN Phone Number: 01/23/2020, 9:49 AM  Clinical Narrative:                  This CM was notified by attending MD that pt needs hospice (residential vs LTC with hospice). Spoke with pt at bedside who states that she would like residential hospice. This CM contacted [facility] liaison for referral to [facility]. Awaiting confirmation that pt would qualify for [facility].      Activities of Daily Living Home Assistive Devices/Equipment: Gilford Rile (specify type), Eyeglasses ADL Screening (condition at time of admission) Patient's cognitive ability adequate to safely complete daily activities?: Yes Is the patient deaf or have difficulty hearing?: Yes Does the patient have difficulty seeing, even when wearing glasses/contacts?: Yes Does the patient have difficulty concentrating, remembering, or making decisions?: Yes Patient able to express need for assistance with ADLs?: Yes Does the patient have difficulty dressing or bathing?: Yes Independently performs ADLs?: No Communication: Independent Dressing (OT): Needs assistance Is this a change from baseline?: Pre-admission baseline Grooming: Needs assistance Is this a change from baseline?: Pre-admission baseline Feeding: Independent Bathing: Needs assistance Is this a change from baseline?: Pre-admission baseline Toileting: Needs assistance Is this a change from baseline?: Pre-admission baseline In/Out Bed: Dependent Is this a change from baseline?: Pre-admission baseline Walks in Home: Dependent Is this a change from baseline?: Pre-admission baseline Does the patient have difficulty walking or climbing stairs?: Yes Weakness of Legs: Both Weakness of Arms/Hands: None  Permission Sought/Granted                  Emotional Assessment               Admission diagnosis:  Weakness [R53.1] Metastatic cancer to spine [facility]) [C79.51] Metastatic malignant neoplasm, unspecified site [facility]) [C79.9] Patient Active Problem List   Diagnosis Date Noted  . Bilateral leg weakness 01/21/2020  . Metastatic cancer to spine ([place]) 01/21/2020  . Brain metastases ([place]) 10/02/2019  . Neoplasm of brain causing mass effect on adjacent structures ([place]) 09/03/2019  . Acute lower UTI 09/03/2019  . Failure to thrive in adult 09/03/2019  . Chemotherapy-induced neuropathy ([place]) 11/21/2017  . Port-A-Cath in place 01/31/2017  . Cancer of overlapping sites of left female breast ([place]) 12/14/2016  . Genetic testing 12/07/2016  . Malignant neoplasm of upper-outer quadrant of left breast in female, estrogen receptor positive ([place]) 11/03/2016  . Posttraumatic stress disorder 09/06/2012  . Major depressive disorder, recurrent episode, severe, specified as with psychotic behavior 09/06/2012  . Cannabis abuse 09/06/2012  . Cocaine abuse ([place]) 09/06/2012   PCP:  Department, [place]:   [facility] [place], [place] [address] [place] [address] Phone: [phone] Fax: [phone]     Social Determinants of Health (SDOH) Interventions    Readmission Risk Interventions No flowsheet data found.

## 2020-01-23 NOTE — Progress Notes (Signed)
Patient resting comfortably in bed at this time, new iv placed in right forearm after prior site was accidentally dislodged by patient while transferring to [facility], VSS, medicating for bilateral leg pain per MAR, bed locked and low, alarm on, will continue to monitor for safety and comfort.

## 2020-01-23 NOTE — Progress Notes (Signed)
OT Cancellation Note  Patient Details Name: Diane Rosario MRN: [id number] DOB: 06-17-1975   Cancelled Treatment:    Reason Eval/Treat Not Completed: Other (comment). Plan is for patient to transfer to residential hospice. OT will defer evaluation and sign off. Please reconsult if GOC change.  Caleel Kiner L Lorrena Goranson 01/23/2020, 7:47 AM

## 2020-01-23 NOTE — Progress Notes (Signed)
Daily Progress Note   Patient Name: Diane Rosario       Date: 01/23/2020 DOB: 1975-09-13  Age: 44 y.o. MRN#: [id number] Attending Physician: Dwyane Dee, MD Primary Care Physician: Department, [place] Date: 01/20/2020  Reason for Consultation/Follow-up: Establishing goals of care  Subjective:  resting in bed, complaining of pain.   Length of Stay: 2  Current Medications: Scheduled Meds:  . dexamethasone (DECADRON) injection  4 mg Intravenous Q6H  . feeding supplement  237 mL Oral TID BM    Continuous Infusions:   PRN Meds: acetaminophen **OR** acetaminophen, ALPRAZolam, HYDROcodone-acetaminophen, HYDROmorphone (DILAUDID) injection, ondansetron **OR** ondansetron (ZOFRAN) IV  Physical Exam         Awake alert Thin  Cancer related cachexia evident No edema Regular  S1 S 2  Abdomen not distended  Vital Signs: BP 106/69 (BP Location: Right Arm)   Pulse 70   Temp 97.8 F (36.6 C) (Oral)   Resp 14   Ht $R'5\' 6"'Zy$  (1.676 m)   Wt 38.8 kg   SpO2 96%   BMI 13.81 kg/m  SpO2: SpO2: 96 % O2 Device: O2 Device: Room Air O2 Flow Rate:    Intake/output summary:   Intake/Output Summary (Last 24 hours) at 01/23/2020 1008 Last data filed at 01/23/2020 8850 Gross per 24 hour  Intake 360 ml  Output 600 ml  Net -240 ml   LBM: Last BM Date:  (Pt reports last week Monday 10/4) Baseline Weight: Weight: 38.8 kg Most recent weight: Weight: 38.8 kg       Palliative Assessment/Data:      Patient Active Problem List   Diagnosis Date Noted  . Bilateral leg weakness 01/21/2020  . Metastatic cancer to spine ([place]) 01/21/2020  . Brain metastases ([place]) 10/02/2019  . Neoplasm of brain causing mass effect on adjacent structures ([place]) 09/03/2019  . Acute lower  UTI 09/03/2019  . Failure to thrive in adult 09/03/2019  . Chemotherapy-induced neuropathy ([place]) 11/21/2017  . Port-A-Cath in place 01/31/2017  . Cancer of overlapping sites of left female breast ([place]) 12/14/2016  . Genetic testing 12/07/2016  . Malignant neoplasm of upper-outer quadrant of left breast in female, estrogen receptor positive ([place]) 11/03/2016  . Posttraumatic stress disorder 09/06/2012  . Major depressive disorder, recurrent episode, severe, specified as with psychotic behavior 09/06/2012  .  Cannabis abuse 09/06/2012  . Cocaine abuse ([place]) 09/06/2012    Palliative Care Assessment & Plan   Patient Profile:  Daira Hine Simpsonis a 44 y.o.femalewith medical history significant ofbreast cancer with recurrence and metastatic disease to brain, cocaine abuse, depression, BPD1, anxiety.  Pt underwent craniotomy for occipital met in May followed by 27 out of 30 prescribed Gy whole brain radiation therapy completed in Aug.  Pt presented to ED with generalized weakness and new inability to walk. Some pain in buttock area that radiates down legs. Symptoms onset 1 week ago, progressively worsening, nothing makes better or worse. Repeated falls initially, now unable to walk. CT head was neg. Pt transferred to obtain MRI.  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 She was evaluated by oncology and radiation oncology on admission.  Her prognosis was considered extremely poor with no further options of treatment that she could feasibly withstand.  Recommendation was made for the patient to consider hospice and end-of-life care. Palliative care consulted for goals of care discussions with patient and family.  Decision was made for transitioning to residential hospice and plan will be to discharge to [facility] on  01/23/2020.  Assessment:  fatigue frailty deconditioning Uncontrolled pain  Recommendations/Plan:  Recommend IV Dilaudid PRN now and right before hospice transfer for pain control  Residential hospice when bed available   PPS 30% Goals of Care and Additional Recommendations:  Limitations on Scope of Treatment: Full Comfort Care  Code Status:    Code Status Orders  (From admission, onward)         Start     Ordered   01/22/20 1209  Do not attempt resuscitation (DNR)  Continuous       Question Answer Comment  In the event of cardiac or respiratory ARREST Do not call a "code blue"   In the event of cardiac or respiratory ARREST Do not perform Intubation, CPR, defibrillation or ACLS   In the event of cardiac or respiratory ARREST Use medication by any route, position, wound care, and other measures to relive pain and suffering. May use oxygen, suction and manual treatment of airway obstruction as needed for comfort.      01/22/20 1208        Code Status History    Date Active Date Inactive Code Status Order ID Comments User Context   01/21/2020 0431 01/22/2020 1208 Full Code [id number]  Etta Quill, DO ED   09/03/2019 0046 09/07/2019 2151 Full Code [id number]  Orene Desanctis, DO ED   12/14/2016 1830 12/16/2016 2005 Full Code [id number]  [name], MD Inpatient   Advance Care Planning Activity       Prognosis:   < 2 weeks  Discharge Planning:  Hospice facility  Care plan was discussed with  Patient   Thank you for allowing the Palliative Medicine Team to assist in the care of this patient.   Time In: 9 Time Out: 9.25 Total Time 25 Prolonged Time Billed No       Greater than 50%  of this time was spent counseling and coordinating care related to the above assessment and plan.  Loistine Chance, MD  Please contact Palliative Medicine Team phone at [phone] for questions and concerns.

## 2020-03-11 DEATH — deceased

## 2021-01-01 NOTE — Progress Notes (Signed)
This encounter was created in error - please disregard.
